# Patient Record
Sex: Male | Born: 1970 | Race: White | Hispanic: No | Marital: Married | Smoking: Former smoker
Health system: Southern US, Community
[De-identification: ages and names within clinical notes are randomized; demographics above are authoritative.]

## PROBLEM LIST (undated history)

## (undated) DIAGNOSIS — G459 Transient cerebral ischemic attack, unspecified: Secondary | ICD-10-CM

## (undated) DIAGNOSIS — Z8673 Personal history of transient ischemic attack (TIA), and cerebral infarction without residual deficits: Secondary | ICD-10-CM

## (undated) DIAGNOSIS — I639 Cerebral infarction, unspecified: Secondary | ICD-10-CM

## (undated) DIAGNOSIS — C3431 Malignant neoplasm of lower lobe, right bronchus or lung: Secondary | ICD-10-CM

## (undated) DIAGNOSIS — I10 Essential (primary) hypertension: Secondary | ICD-10-CM

## (undated) DIAGNOSIS — N529 Male erectile dysfunction, unspecified: Secondary | ICD-10-CM

## (undated) DIAGNOSIS — Z87898 Personal history of other specified conditions: Secondary | ICD-10-CM

## (undated) DIAGNOSIS — M76899 Other specified enthesopathies of unspecified lower limb, excluding foot: Secondary | ICD-10-CM

## (undated) DIAGNOSIS — G4733 Obstructive sleep apnea (adult) (pediatric): Secondary | ICD-10-CM

## (undated) DIAGNOSIS — Q211 Atrial septal defect: Secondary | ICD-10-CM

## (undated) DIAGNOSIS — R630 Anorexia: Secondary | ICD-10-CM

## (undated) DIAGNOSIS — J90 Pleural effusion, not elsewhere classified: Secondary | ICD-10-CM

## (undated) DIAGNOSIS — Z9989 Dependence on other enabling machines and devices: Secondary | ICD-10-CM

## (undated) DIAGNOSIS — I493 Ventricular premature depolarization: Secondary | ICD-10-CM

## (undated) HISTORY — DX: Cerebral infarction, unspecified: I63.9

## (undated) HISTORY — DX: Personal history of other specified conditions: Z87.898

## (undated) HISTORY — DX: Essential (primary) hypertension: I10

## (undated) HISTORY — DX: Personal history of transient ischemic attack (TIA), and cerebral infarction without residual deficits: Z86.73

## (undated) HISTORY — DX: Pleural effusion, not elsewhere classified: J90

## (undated) HISTORY — DX: Other specified enthesopathies of unspecified lower limb, excluding foot: M76.899

## (undated) HISTORY — DX: Transient cerebral ischemic attack, unspecified: G45.9

## (undated) HISTORY — PX: EYE SURGERY: SHX253

## (undated) HISTORY — DX: Male erectile dysfunction, unspecified: N52.9

## (undated) HISTORY — DX: Malignant neoplasm of lower lobe, right bronchus or lung: C34.31

## (undated) HISTORY — DX: Ventricular premature depolarization: I49.3

## (undated) HISTORY — DX: Anorexia: R63.0

---
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
## 2006-07-21 ENCOUNTER — Ambulatory Visit: Payer: Self-pay | Admitting: Ophthalmology

## 2011-09-27 HISTORY — PX: VITRECTOMY: SHX106

## 2013-04-16 ENCOUNTER — Ambulatory Visit: Payer: Self-pay | Admitting: Ophthalmology

## 2013-05-15 ENCOUNTER — Ambulatory Visit: Payer: Self-pay | Admitting: Ophthalmology

## 2015-01-16 NOTE — Op Note (Signed)
PATIENT NAMEMASAKI, Ruben Eaton MR#:  [id number] DATE OF BIRTH:  1970-10-06  DATE OF PROCEDURE:  05/15/2013  PROCEDURES PERFORMED: 1.  Pars plana vitrectomy of the right eye.  2.  Internal limiting membrane peel of the right eye.  3.  Phacoemulsification, intraocular lens insertion of the right eye.  4.  Endolaser of the right eye.   PREOPERATIVE DIAGNOSES: 1.  Visually-significant posterior sclerotic cataract, with vision of 20/50 or less.  2.  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   POSTOPERATIVE DIAGNOSIS:  1.  Visually-significant posterior sclerotic cataract, with vision of 20/50 or less.  2.  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   ESTIMATED BLOOD LOSS: Less than 1 mL.   PRIMARY SURGEON: Garlan Fair, M.D.   ANESTHESIA: Retrobulbar block of the right eye with monitored anesthesia care.   COMPLICATIONS: None.   INDICATIONS FOR PROCEDURE: The patient presented to my office with slowly decreasing vision in the right eye. The patient noted he is having difficulty reading and driving. The patient had a best-corrected visual acuity of 20/50-.   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 Risks, benefits, and alternatives of the above procedure were discussed, and the patient wished to proceed.   DETAILS: After informed consent was obtained, the patient was brought into the operative suite at [facility]. The patient was placed in supine position, given a small dose of Alfenta, and a retrobulbar block was performed on the right eye by the primary surgeon without any complications. The right eye was prepped and draped in sterile manner. After a lid speculum was inserted a side-port wound was created at approximately 10:30. DisCoVisc was injected into the anterior chamber  in order to maintain it. A main corneal wound was created at approximately 12 o'clock. A cystotome was introduced in the eye, and a continuous 360-degree anterior capsulorrhexis was created. The lens was hydrodissected using BSS. The lens was rotated 180 degrees. The phacoemulsification wand was introduced in the eye and the lens was broken into two halves and removed without complication.   Cortical and epinuclear material was removed using I and A without any complication. DisCoVisc was injected into the capsular bag in order to maintain it. A 12-diopter ZCB00 lens was injected into the capsular bag and rotated into position. A 10-0 nylon stitch was placed at the main corneal wound. I and A was used to remove the DisCoVisc. The 10-0 was tied and the wounds were hydrated to pressurize the eye and form the anterior chamber. The wounds were noted to be watertight, and attention was turned to the pars plana vitrectomy portion of the case.   A 25-gauge trocar was placed inferotemporally through displaced conjunctiva in an oblique fashion 4 mm beyond the limbus. A 25-gauge trocar was placed inferotemporally through displaced conjunctiva in an oblique fashion 3 mm beyond the limbus. The infusion cannula was turned on and inserted through the trocar and secured in position with Steri-Strips. Two more trocars were placed in a similar fashion superotemporally and superonasally. The vitreous cutter and light pipe were introduced in the eye and a core vitrectomy was performed. The vitreous base was confirmed as already elevated with visualization of the Weiss ring and with suction. Peripheral vitreous was trimmed for 360 degrees. Indocyanine green was injected onto the posterior pole and the epiretinal membrane counter-stained. The  indocyanine green was removed within 30 seconds, and both the epiretinal membrane and an internal limiting membrane peel was performed for 360 degrees around the fovea.   Once this was  completed, a scleral depressed exam was performed for 360 degrees. There were multiple areas of retinal tufts at the ora serrata. There did not appear to be any true openings of the retina, however, given the patient's significant myopia the surgeon elected to put 360 degrees of 3 rows of laser posterior to the ora serrata. This encompassed all the areas of retinal tufts. There were no signs of any breaks, tears, or retinal detachment.   Once this was completed, a partial air-fluid exchange was performed. The trocars were removed and the wounds were noted to be airtight; 5 mg of dexamethasone were given into the inferior fornix and pressure in the eye was confirmed to be approximately 15 mmHg. The lid speculum was removed and the eye was cleaned. TobraDex was placed in the eye and the patient was taken to postanesthesia care, with instructions to remain head-up.     ____________________________ Teresa Pelton. Starling Manns, MD mfa:[id number] D: 05/15/2013 08:37:03 ET T: 05/15/2013 09:08:41 ET JOB#: [id number]  cc: Teresa Pelton. Starling Manns, MD, <Dictator> Coralee Rud MD ELECTRONICALLY SIGNED 05/15/2013 10:27

## 2015-12-28 ENCOUNTER — Ambulatory Visit: Payer: Self-pay | Admitting: Physician Assistant

## 2015-12-28 ENCOUNTER — Encounter: Payer: Self-pay | Admitting: Physician Assistant

## 2015-12-28 VITALS — BP 130/90 | HR 60 | Temp 98.6°F

## 2015-12-28 DIAGNOSIS — M25561 Pain in right knee: Secondary | ICD-10-CM

## 2015-12-28 NOTE — Progress Notes (Signed)
S: c/o r knee pain for about a month, was in the gym doing squats and leg extensions, felt pain in leg but kept adding weight, sx were worse a month ago, did wear a knee brace for awhile, took nsaids, iced, still having pain at distal portion of quads and upper portion of patella, is worse in the mornings upon waking; seems to stretch out throughout the day; no clicking, popping, or numbness/tingling; states he runs on pavement, is still doing squats and extensions  O: Vitals wnl, nad, knee neg for bony tenderness, full rom, ligaments appear intact, area of supratella and distal quads is minimally tender, n/v intact  A: muscle strain of quads, patella tendonitis  P: wear knee brace during workouts, ice, nsaids, stop running on pavement, use bike, eliptical machine, arc trainer, or precor, if sx not improving in 1-2 weeks then f/u with sports med

## 2016-01-22 DIAGNOSIS — G4733 Obstructive sleep apnea (adult) (pediatric): Secondary | ICD-10-CM | POA: Diagnosis not present

## 2016-02-01 ENCOUNTER — Ambulatory Visit (INDEPENDENT_AMBULATORY_CARE_PROVIDER_SITE_OTHER): Payer: 59 | Admitting: Family Medicine

## 2016-02-01 ENCOUNTER — Encounter: Payer: Self-pay | Admitting: Family Medicine

## 2016-02-01 ENCOUNTER — Other Ambulatory Visit: Payer: Self-pay

## 2016-02-01 VITALS — BP 132/82 | HR 75 | Ht 66.0 in | Wt 170.0 lb

## 2016-02-01 DIAGNOSIS — M769 Unspecified enthesopathy, lower limb, excluding foot: Secondary | ICD-10-CM

## 2016-02-01 DIAGNOSIS — I10 Essential (primary) hypertension: Secondary | ICD-10-CM | POA: Insufficient documentation

## 2016-02-01 DIAGNOSIS — M25561 Pain in right knee: Secondary | ICD-10-CM | POA: Diagnosis not present

## 2016-02-01 DIAGNOSIS — M76899 Other specified enthesopathies of unspecified lower limb, excluding foot: Secondary | ICD-10-CM

## 2016-02-01 DIAGNOSIS — Z87898 Personal history of other specified conditions: Secondary | ICD-10-CM | POA: Insufficient documentation

## 2016-02-01 DIAGNOSIS — N529 Male erectile dysfunction, unspecified: Secondary | ICD-10-CM | POA: Insufficient documentation

## 2016-02-01 HISTORY — DX: Essential (primary) hypertension: I10

## 2016-02-01 HISTORY — DX: Other specified enthesopathies of unspecified lower limb, excluding foot: M76.899

## 2016-02-01 MED ORDER — VITAMIN D (ERGOCALCIFEROL) 1.25 MG (50000 UNIT) PO CAPS: 50000.0000 [IU] | ORAL_CAPSULE | ORAL | Status: DC

## 2016-02-01 MED ORDER — DICLOFENAC SODIUM 2 % TD SOLN: TRANSDERMAL | Status: DC

## 2016-02-01 NOTE — Assessment & Plan Note (Signed)
Patient does have a quadricep tendinitis as well as what appears to be a spur on the superior anterior aspect of the patella. I think that this is contribute in. We discussed a patella strap to help with the fulcrum and decreased the amount of irritation in the area. Topical anti-inflammatory's prescribed. Once weekly vitamin D given in case this is a nonhealing fracture. We discussed icing regimen. Patient and will follow-up with me again in 3-4 weeks. Unable to do the nitroglycerin patches secondary to other chronic comorbidities. Patient continues to have pain he would be more of a candidate for formal physical therapy and potential injections.

## 2016-02-01 NOTE — Patient Instructions (Signed)
Good to see you.  Ice 20 minutes 2 times daily. Usually after activity and before bed. Exercises 3 times a week.  pennsaid pinkie amount topically 2 times daily as needed.  Once weekly vitamin D Avoid jumping or deep squats for now and would cross train more with biking or swimming.  Patella strap on the bottom portion of your knee cap can help with working out as well See me again in 3-4 weeks.

## 2016-02-01 NOTE — Progress Notes (Signed)
Pre visit review using our clinic review tool, if applicable. No additional management support is needed unless otherwise documented below in the visit note. 

## 2016-02-01 NOTE — Progress Notes (Signed)
[name] [address] [place], [place] [address] Phone: [phone] Subjective:    I'm seeing this patient by the request  of:  Ashok Cordia  CC: Right Knee pain  [id number] Ruben Eaton is a 45 y.o. male coming in with complaint of knee pain. Patient states that he was in the gym doing squats and leg extensions and trying to work through the pain. Patient states over the course of time and seems be worsening. Has tried bracing, anti-inflammatories, icing. Pain seems to be on the anterior aspect of the knee.'s on provider. Concern for more of a patellar tendinitis or quadricep injury. Patient continues to work out but unfortunately cannot do anymore squatting. States that squatting seems to cause more pain. Things like running causes pain 2. All aching pain. Some mild weakness compared to the contralateral side is a severity of pain a 6 out of 10. Does not seem to be improving anymore. Patient like to start doing his other working out but is unable to do so. Denies any nighttime awakening from the pain.    No past medical history on file.  Patient Active Problem List   Diagnosis Date Noted  . 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 Essential (primary) hypertension 02/01/2016  . H/O disease 02/01/2016    No past surgical history on file. Social History   Social History  . Marital Status: Married    Spouse Name: N/A  . Number of Children: N/A  . Years of Education: N/A   Social History Main Topics  . Smoking status: Never Smoker   . Smokeless tobacco: None  . Alcohol Use: None  . Drug Use: None  . Sexual Activity: Not Asked   Other Topics Concern  . None   Social History Narrative   No Known Allergies No family history on file.  Past medical history, social, surgical and family history all reviewed in electronic medical record.  No pertanent information unless stated regarding to the chief complaint.   Review of  Systems: No headache, visual changes, nausea, vomiting, diarrhea, constipation, dizziness, abdominal pain, skin rash, fevers, chills, night sweats, weight loss, swollen lymph nodes, body aches, joint swelling, muscle aches, chest pain, shortness of breath, mood changes.   Objective Blood pressure 132/82, pulse 75, height '5\' 6"'$  (1.676 m), weight 170 lb (77.111 kg), SpO2 97 %.  General: No apparent distress alert and oriented x3 mood and affect normal, dressed appropriately.  HEENT: Pupils equal, extraocular movements intact  Respiratory: Patient's speak in full sentences and does not appear short of breath  Cardiovascular: No lower extremity edema, non tender, no erythema  Skin: Warm dry intact with no signs of infection or rash on extremities or on axial skeleton.  Abdomen: Soft nontender  Neuro: Cranial nerves II through XII are intact, neurovascularly intact in all extremities with 2+ DTRs and 2+ pulses.  Lymph: No lymphadenopathy of posterior or anterior cervical chain or axillae bilaterally.  Gait normal with good balance and coordination.  MSK:  Non tender with full range of motion and good stability and symmetric strength and tone of shoulders, elbows, wrist, hip, and ankles bilaterally.  Knee: Right Normal to inspection with no erythema or effusion or obvious bony abnormalities. Mild tenderness over the superior patella ROM full in flexion and extension and lower leg rotation. Ligaments with solid consistent endpoints including ACL, PCL, LCL, MCL. Negative Mcmurray's, Apley's, and Thessalonian tests. Non painful patellar compression. Patellar glide with mild  crepitus. Patellar and quadriceps tendons unremarkable. Hamstring and quadriceps strength is normal.  Contralateral knee unremarkable  MSK US performed of: Right knee This study was ordered, performed, and interpreted by Charlann Boxer D.O.  Knee: All structures visualized. Anteromedial, anterolateral, posteromedial, and  posterolateral menisci unremarkable without tearing, fraying, effusion, or displacement. Patellar Tendon unremarkable on long and transverse views without effusion. Quadricep tendon does have hypoechoic changes and some mild intersubstance tearing. Patient though does have what appears to be a patella spur superiorly verse possible nonhealing fracture. No abnormality of prepatellar bursa. LCL and MCL unremarkable on long and transverse views. No abnormality of origin of medial or lateral head of the gastrocnemius.  IMPRESSION:  Quadricep tendinitis with spurring of the superior patella reverse questionable nonhealing fracture  Procedure note 97026; 15 minutes spent for Therapeutic exercises as stated in above notes.  This included exercises focusing on stretching, strengthening, with significant focus on eccentric aspects.  Flexion and extension exercises working on eccentric aspect of the quadricep tendon as well as vastus medialis obliques strengthening. Proper technique shown and discussed handout in great detail with ATC.  All questions were discussed and answered.      Impression and Recommendations:     This case required medical decision making of moderate complexity.      Note: This dictation was prepared with Dragon dictation along with smaller phrase technology. Any transcriptional errors that result from this process are unintentional.

## 2016-02-04 DIAGNOSIS — I10 Essential (primary) hypertension: Secondary | ICD-10-CM | POA: Diagnosis not present

## 2016-02-04 DIAGNOSIS — R6882 Decreased libido: Secondary | ICD-10-CM | POA: Diagnosis not present

## 2016-02-12 DIAGNOSIS — Z87898 Personal history of other specified conditions: Secondary | ICD-10-CM | POA: Diagnosis not present

## 2016-02-12 DIAGNOSIS — I10 Essential (primary) hypertension: Secondary | ICD-10-CM | POA: Diagnosis not present

## 2016-02-12 DIAGNOSIS — N529 Male erectile dysfunction, unspecified: Secondary | ICD-10-CM | POA: Diagnosis not present

## 2016-02-29 ENCOUNTER — Ambulatory Visit (INDEPENDENT_AMBULATORY_CARE_PROVIDER_SITE_OTHER): Payer: 59 | Admitting: Family Medicine

## 2016-02-29 ENCOUNTER — Other Ambulatory Visit: Payer: Self-pay

## 2016-02-29 ENCOUNTER — Encounter: Payer: Self-pay | Admitting: Family Medicine

## 2016-02-29 VITALS — BP 130/82 | HR 68 | Ht 66.0 in | Wt 167.0 lb

## 2016-02-29 DIAGNOSIS — M769 Unspecified enthesopathy, lower limb, excluding foot: Secondary | ICD-10-CM | POA: Diagnosis not present

## 2016-02-29 DIAGNOSIS — M25561 Pain in right knee: Secondary | ICD-10-CM

## 2016-02-29 DIAGNOSIS — M76899 Other specified enthesopathies of unspecified lower limb, excluding foot: Secondary | ICD-10-CM

## 2016-02-29 NOTE — Progress Notes (Signed)
Ruben Eaton, [place] [address] Phone: [phone] Subjective:    I'm seeing this patient by the request  of:  Ruben Eaton  CC: Right Knee pain Follow-up  [id number] Ruben Eaton is a 45 y.o. male coming in with complaint of knee pain. Patient was found to have quadricep tendinitis and a bone spur. Patient has changed his activities, doing some bracing, avoiding heavy lifting, as well as icing. States that he is feeling approximately 40% better. No longer having pain with regular daily activities. Patient does try to work out though he does have some discomfort. Still severe discomfort when going up or downstairs. Only on the anterior aspect of the knee. Maybe less swelling.    No past medical history on file.  Patient Active Problem List   Diagnosis Date Noted  . 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 Essential (primary) hypertension 02/01/2016  . H/O disease 02/01/2016  . Quadriceps tendinitis 02/01/2016    No past surgical history on file. Social History   Social History  . Marital Status: Married    Spouse Name: N/A  . Number of Children: N/A  . Years of Education: N/A   Social History Main Topics  . Smoking status: Never Smoker   . Smokeless tobacco: None  . Alcohol Use: None  . Drug Use: None  . Sexual Activity: Not Asked   Other Topics Concern  . None   Social History Narrative   No Known Allergies No family history on file. No known family history of rheumatological diseases  Past medical history, social, surgical and family history all reviewed in electronic medical record.  No pertanent information unless stated regarding to the chief complaint.   Review of Systems: No headache, visual changes, nausea, vomiting, diarrhea, constipation, dizziness, abdominal pain, skin rash, fevers, chills, night sweats, weight loss, swollen lymph nodes, body aches, joint swelling, muscle aches,  chest pain, shortness of breath, mood changes.   Objective Blood pressure 130/82, pulse 68, height '5\' 6"'$  (1.676 m), weight 167 lb (75.751 kg), SpO2 97 %.  General: No apparent distress alert and oriented x3 mood and affect normal, dressed appropriately.  HEENT: Pupils equal, extraocular movements intact  Respiratory: Patient's speak in full sentences and does not appear short of breath  Cardiovascular: No lower extremity edema, non tender, no erythema  Skin: Warm dry intact with no signs of infection or rash on extremities or on axial skeleton.  Abdomen: Soft nontender  Neuro: Cranial nerves II through XII are intact, neurovascularly intact in all extremities with 2+ DTRs and 2+ pulses.  Lymph: No lymphadenopathy of posterior or anterior cervical chain or axillae bilaterally.  Gait normal with good balance and coordination.  MSK:  Non tender with full range of motion and good stability and symmetric strength and tone of shoulders, elbows, wrist, hip, and ankles bilaterally.  Knee: Right Normal to inspection with no erythema or effusion or obvious bony abnormalities. Continued mild tenderness over the superior patella ROM full in flexion and extension and lower leg rotation. Ligaments with solid consistent endpoints including ACL, PCL, LCL, MCL. Negative Mcmurray's, Apley's, and Thessalonian tests. Non painful patellar compression. Patellar glide with mild crepitus. Patellar and quadriceps tendons unremarkable. Hamstring and quadriceps strength is normal.  Contralateral knee unremarkable  MSK US performed of: Right knee This study was ordered, performed, and interpreted by Ruben Eaton D.O.  Knee: All structures visualized. Anteromedial, anterolateral, posteromedial, and posterolateral menisci unremarkable without  tearing, fraying, effusion, or displacement. Patellar Tendon unremarkable on long and transverse views without effusion. Quadricep tendon d this had significant decrease in  hypoechoic changes. Still increasing Doppler flow noted. Patient does have a large patellar spur measuring 0.73 cm in length. This does cause contact with the quadricep tendon. No intersubstance tearing of the quadricep tendon. No abnormality of prepatellar bursa. LCL and MCL unremarkable on long and transverse views. No abnormality of origin of medial or lateral head of the gastrocnemius.  IMPRESSION:  Continued patellar spur causing quadricep tendinitis      Impression and Recommendations:     This case required medical decision making of moderate complexity.      Note: This dictation was prepared with Dragon dictation along with smaller phrase technology. Any transcriptional errors that result from this process are unintentional.

## 2016-02-29 NOTE — Progress Notes (Signed)
.  bpcmh

## 2016-02-29 NOTE — Assessment & Plan Note (Signed)
Patient is making some improvement but would not be able to start increasing his activity secondary to the size of the spur. We discussed different treatment options. Patient's will continue with the home exercises, we will have patient continue with the vitamin D supplementation. Given new prescription for nitroglycerin and warned of potential side effects. Patient is no longer taking the erectile dysfunction medication. 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 We discussed icing regimen still and bracing. Patient will follow-up again in 4 weeks. At that time we'll likely ultrasound to make sure that this is improving. Patient would also be a candidate for PRP injection to try to decrease the size of the spur itself.

## 2016-02-29 NOTE — Patient Instructions (Signed)
Good to see you  Ice is your friend still  I would continue the strap and vitamin D Nitroglycerin Protocol   Apply 1/4 nitroglycerin patch to affected area daily.  Change position of patch within the affected area every 24 hours.  You may experience a headache during the first 1-2 weeks of using the patch, these should subside.  If you experience headaches after beginning nitroglycerin patch treatment, you may take your preferred over the counter pain reliever.  Another side effect of the nitroglycerin patch is skin irritation or rash related to patch adhesive.  Please notify our office if you develop more severe headaches or rash, and stop the patch.  Tendon healing with nitroglycerin patch may require 12 to 24 weeks depending on the extent of injury.  Men should not use if taking Viagra, Cialis, or Levitra.   Do not use if you have migraines or rosacea.   Keep doing the exercises  See me again in 4 weeks we will rescan and make sure the spur is getting smaller.

## 2016-03-01 ENCOUNTER — Other Ambulatory Visit: Payer: Self-pay | Admitting: *Deleted

## 2016-03-01 MED ORDER — NITROGLYCERIN 0.2 MG/HR TD PT24: MEDICATED_PATCH | TRANSDERMAL | Status: DC

## 2016-03-04 DIAGNOSIS — H35371 Puckering of macula, right eye: Secondary | ICD-10-CM | POA: Diagnosis not present

## 2016-03-30 ENCOUNTER — Other Ambulatory Visit: Payer: Self-pay

## 2016-03-30 ENCOUNTER — Encounter: Payer: Self-pay | Admitting: Family Medicine

## 2016-03-30 ENCOUNTER — Ambulatory Visit (INDEPENDENT_AMBULATORY_CARE_PROVIDER_SITE_OTHER): Payer: 59 | Admitting: Family Medicine

## 2016-03-30 VITALS — BP 126/76 | HR 66 | Wt 171.0 lb

## 2016-03-30 DIAGNOSIS — M769 Unspecified enthesopathy, lower limb, excluding foot: Secondary | ICD-10-CM | POA: Diagnosis not present

## 2016-03-30 DIAGNOSIS — M76899 Other specified enthesopathies of unspecified lower limb, excluding foot: Secondary | ICD-10-CM

## 2016-03-30 NOTE — Patient Instructions (Signed)
Great to see you  You are doing great  Start at 50% of what you were doing.  Increase 10% a week.  If pain, take 1 step back Continue the nitro for another month. Then go to 3 times a week for a month See me again in 2 months.

## 2016-03-30 NOTE — Progress Notes (Signed)
[name] [address] [place], [place] [address] Phone: [phone] Subjective:    CC: Right Knee pain Follow-up  [id number] Ruben Eaton is a 45 y.o. male coming in with complaint of knee pain. Patient was found to have quadricep tendinitis and a bone spur. Patient has changed his activities, doing some bracing, avoiding heavy lifting, as well as icing. States that he is feeling approximately 85% better. Patient was also started on nitroglycerin and has had no side effects. Feels that he is able to do more activities. States that he really has no pain overall. Would put the severity of pain is 1 out of 10. Has not been lifting is looking forward to starting.    No past medical history on file.  Patient Active Problem List   Diagnosis Date Noted  . 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 Essential (primary) hypertension 02/01/2016  . H/O disease 02/01/2016  . Quadriceps tendinitis 02/01/2016    No past surgical history on file. Social History   Social History  . Marital Status: Married    Spouse Name: N/A  . Number of Children: N/A  . Years of Education: N/A   Social History Main Topics  . Smoking status: Never Smoker   . Smokeless tobacco: None  . Alcohol Use: None  . Drug Use: None  . Sexual Activity: Not Asked   Other Topics Concern  . None   Social History Narrative   No Known Allergies No family history on file. No known family history of rheumatological diseases  Past medical history, social, surgical and family history all reviewed in electronic medical record.  No pertanent information unless stated regarding to the chief complaint.   Review of Systems: No headache, visual changes, nausea, vomiting, diarrhea, constipation, dizziness, abdominal pain, skin rash, fevers, chills, night sweats, weight loss, swollen lymph nodes, body aches, joint swelling, muscle aches, chest pain, shortness of breath, mood  changes.   Objective Blood pressure 126/76, pulse 66, weight 171 lb (77.565 kg).  General: No apparent distress alert and oriented x3 mood and affect normal, dressed appropriately.  HEENT: Pupils equal, extraocular movements intact  Respiratory: Patient's speak in full sentences and does not appear short of breath  Cardiovascular: No lower extremity edema, non tender, no erythema  Skin: Warm dry intact with no signs of infection or rash on extremities or on axial skeleton.  Abdomen: Soft nontender  Neuro: Cranial nerves II through XII are intact, neurovascularly intact in all extremities with 2+ DTRs and 2+ pulses.  Lymph: No lymphadenopathy of posterior or anterior cervical chain or axillae bilaterally.  Gait normal with good balance and coordination.  MSK:  Non tender with full range of motion and good stability and symmetric strength and tone of shoulders, elbows, wrist, hip, and ankles bilaterally.  Knee: Right Normal to inspection with no erythema or effusion or obvious bony abnormalities. Very minimal tenderness over the superior patellar area ROM full in flexion and extension and lower leg rotation. Ligaments with solid consistent endpoints including ACL, PCL, LCL, MCL. Negative Mcmurray's, Apley's, and Thessalonian tests. Non painful patellar compression. Patellar glide with nocrepitus. Patellar and quadriceps tendons unremarkable. Hamstring and quadriceps strength is normal.  Contralateral knee unremarkable  MSK US performed of: Right knee This study was ordered, performed, and interpreted by Charlann Boxer D.O.  Knee: All structures visualized. Anteromedial, anterolateral, posteromedial, and posterolateral menisci unremarkable without tearing, fraying, effusion, or displacement. Patellar Tendon unremarkable on long and  transverse views without effusion. Quadricep tendon unremarkable  Patient does have a patellar spur measuring 0.35 cm down from0.73 cm in length. No abnormality  of prepatellar bursa. LCL and MCL unremarkable on long and transverse views. No abnormality of origin of medial or lateral head of the gastrocnemius.  IMPRESSION:  Significant decrease in size of patellar spur      Impression and Recommendations:     This case required medical decision making of moderate complexity.      Note: This dictation was prepared with Dragon dictation along with smaller phrase technology. Any transcriptional errors that result from this process are unintentional.

## 2016-03-30 NOTE — Assessment & Plan Note (Signed)
Patient has been progression. Patient given an exercise protocol to start increasing his activity slowly. Continue on the nitroglycerin at this time. Patient knows not taking other medications a could cross-react.patient knows not to take this is Ruben Eaton. We discussed with patient about possible titrating down over the course the next 30 days. We discussed forcing symptoms to take a step back. Patient will follow-up again in 6-8 weeks. If worsening symptoms we may want to consider injection or PRP injections.  Spent  25 minutes with patient face-to-face and had greater than 50% of counseling including as described above in assessment and plan.

## 2016-04-26 DIAGNOSIS — G4733 Obstructive sleep apnea (adult) (pediatric): Secondary | ICD-10-CM | POA: Diagnosis not present

## 2016-05-05 ENCOUNTER — Other Ambulatory Visit: Payer: Self-pay | Admitting: Family Medicine

## 2016-05-05 MED ORDER — VITAMIN D (ERGOCALCIFEROL) 1.25 MG (50000 UNIT) PO CAPS: 50000.0000 [IU] | ORAL_CAPSULE | ORAL | 0 refills | Status: AC

## 2016-06-05 NOTE — Progress Notes (Signed)
[facility] [address] [place], [place] [address] Phone: [phone] Subjective:    CC: Right Knee pain Follow-up  JSE:[id number]  Ruben Eaton is a 45 y.o. male coming in with complaint of knee pain. Patient was found to have quadricep tendinitis and a bone spur. Patient was last seen greater than 2 months ago. Was doing significantly better with home exercises, bracing, medications, topical. Patient is also doing the meticulous patch. Patient states He is 85-90% better. Still some mild discomfort but has been able to increase activity as tolerated. Patient is doing most activities that he would like to be doing at this time. Denies any new symptoms. Fairly happy with the results.    No past medical history on file.  Patient Active Problem List   Diagnosis Date Noted  . 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 Essential (primary) hypertension 02/01/2016  . H/O disease 02/01/2016  . Quadriceps tendinitis 02/01/2016    No past surgical history on file. Social History   Social History  . Marital status: Married    Spouse name: N/A  . Number of children: N/A  . Years of education: N/A   Social History Main Topics  . Smoking status: Never Smoker  . Smokeless tobacco: None  . Alcohol use None  . Drug use: Unknown  . Sexual activity: Not Asked   Other Topics Concern  . None   Social History Narrative  . None   No Known Allergies No family history on file. No known family history of rheumatological diseases  Past medical history, social, surgical and family history all reviewed in electronic medical record.  No pertanent information unless stated regarding to the chief complaint.   Review of Systems: No headache, visual changes, nausea, vomiting, diarrhea, constipation, dizziness, abdominal pain, skin rash, fevers, chills, night sweats, weight loss, swollen lymph nodes, body aches, joint swelling, muscle aches, chest pain,  shortness of breath, mood changes.   Objective  Blood pressure 118/80, pulse 62, weight 168 lb (76.2 kg), SpO2 97 %.  General: No apparent distress alert and oriented x3 mood and affect normal, dressed appropriately.  HEENT: Pupils equal, extraocular movements intact  Respiratory: Patient's speak in full sentences and does not appear short of breath  Cardiovascular: No lower extremity edema, non tender, no erythema  Skin: Warm dry intact with no signs of infection or rash on extremities or on axial skeleton.  Abdomen: Soft nontender  Neuro: Cranial nerves II through XII are intact, neurovascularly intact in all extremities with 2+ DTRs and 2+ pulses.  Lymph: No lymphadenopathy of posterior or anterior cervical chain or axillae bilaterally.  Gait normal with good balance and coordination.  MSK:  Non tender with full range of motion and good stability and symmetric strength and tone of shoulders, elbows, wrist, hip, and ankles bilaterally.  Knee: Right Normal to inspection with no erythema or effusion or obvious bony abnormalities. Nontender today ROM full in flexion and extension and lower leg rotation. Ligaments with solid consistent endpoints including ACL, PCL, LCL, MCL. Negative Mcmurray's, Apley's, and Thessalonian tests. Non painful patellar compression. Patellar glide with no crepitus. Patellar and quadriceps tendons unremarkable. Hamstring and quadriceps strength is normal.  Contralateral knee unremarkable      Impression and Recommendations:     This case required medical decision making of moderate complexity.      Note: This dictation was prepared with Dragon dictation along with smaller phrase technology. Any transcriptional errors that  result from this process are unintentional.

## 2016-06-06 ENCOUNTER — Encounter: Payer: Self-pay | Admitting: Family Medicine

## 2016-06-06 ENCOUNTER — Ambulatory Visit (INDEPENDENT_AMBULATORY_CARE_PROVIDER_SITE_OTHER): Payer: 59 | Admitting: Family Medicine

## 2016-06-06 DIAGNOSIS — M769 Unspecified enthesopathy, lower limb, excluding foot: Secondary | ICD-10-CM

## 2016-06-06 DIAGNOSIS — M76899 Other specified enthesopathies of unspecified lower limb, excluding foot: Secondary | ICD-10-CM

## 2016-06-06 NOTE — Assessment & Plan Note (Signed)
Patient is been doing very well. No severe pain change in management. Patient follow-up as needed. Worsening symptoms patient knows that we will need to consider injections, formal physical therapy or go back on the nitroglycerin.

## 2016-08-08 DIAGNOSIS — I10 Essential (primary) hypertension: Secondary | ICD-10-CM | POA: Diagnosis not present

## 2016-08-08 DIAGNOSIS — Z0000 Encounter for general adult medical examination without abnormal findings: Secondary | ICD-10-CM | POA: Diagnosis not present

## 2016-08-08 DIAGNOSIS — E559 Vitamin D deficiency, unspecified: Secondary | ICD-10-CM | POA: Diagnosis not present

## 2016-08-17 DIAGNOSIS — G4733 Obstructive sleep apnea (adult) (pediatric): Secondary | ICD-10-CM | POA: Diagnosis not present

## 2016-08-23 DIAGNOSIS — R899 Unspecified abnormal finding in specimens from other organs, systems and tissues: Secondary | ICD-10-CM | POA: Diagnosis not present

## 2016-08-23 DIAGNOSIS — Z0001 Encounter for general adult medical examination with abnormal findings: Secondary | ICD-10-CM | POA: Diagnosis not present

## 2016-08-23 DIAGNOSIS — I10 Essential (primary) hypertension: Secondary | ICD-10-CM | POA: Diagnosis not present

## 2016-08-26 DIAGNOSIS — I639 Cerebral infarction, unspecified: Secondary | ICD-10-CM

## 2016-08-26 HISTORY — DX: Cerebral infarction, unspecified: I63.9

## 2016-09-07 ENCOUNTER — Observation Stay: Payer: 59

## 2016-09-07 ENCOUNTER — Inpatient Hospital Stay
Admission: EM | Admit: 2016-09-07 | Discharge: 2016-09-08 | DRG: 065 | Disposition: A | Discharge status: Home / Self Care | Payer: 59 | Attending: Internal Medicine | Admitting: Internal Medicine

## 2016-09-07 ENCOUNTER — Encounter: Payer: Self-pay | Admitting: Emergency Medicine

## 2016-09-07 ENCOUNTER — Observation Stay (HOSPITAL_COMMUNITY)
Admit: 2016-09-07 | Discharge: 2016-09-07 | Disposition: A | Discharge status: Home / Self Care | Payer: 59 | Attending: Internal Medicine | Admitting: Internal Medicine

## 2016-09-07 ENCOUNTER — Emergency Department: Payer: 59

## 2016-09-07 DIAGNOSIS — Z833 Family history of diabetes mellitus: Secondary | ICD-10-CM

## 2016-09-07 DIAGNOSIS — Z823 Family history of stroke: Secondary | ICD-10-CM

## 2016-09-07 DIAGNOSIS — E785 Hyperlipidemia, unspecified: Secondary | ICD-10-CM | POA: Diagnosis not present

## 2016-09-07 DIAGNOSIS — M79604 Pain in right leg: Secondary | ICD-10-CM | POA: Diagnosis not present

## 2016-09-07 DIAGNOSIS — R29818 Other symptoms and signs involving the nervous system: Secondary | ICD-10-CM | POA: Diagnosis not present

## 2016-09-07 DIAGNOSIS — R29810 Facial weakness: Secondary | ICD-10-CM | POA: Diagnosis not present

## 2016-09-07 DIAGNOSIS — R52 Pain, unspecified: Secondary | ICD-10-CM

## 2016-09-07 DIAGNOSIS — E871 Hypo-osmolality and hyponatremia: Secondary | ICD-10-CM | POA: Diagnosis present

## 2016-09-07 DIAGNOSIS — I69392 Facial weakness following cerebral infarction: Secondary | ICD-10-CM

## 2016-09-07 DIAGNOSIS — I639 Cerebral infarction, unspecified: Secondary | ICD-10-CM | POA: Diagnosis not present

## 2016-09-07 DIAGNOSIS — I10 Essential (primary) hypertension: Secondary | ICD-10-CM | POA: Diagnosis not present

## 2016-09-07 DIAGNOSIS — R29700 NIHSS score 0: Secondary | ICD-10-CM | POA: Diagnosis present

## 2016-09-07 DIAGNOSIS — Q211 Atrial septal defect: Secondary | ICD-10-CM | POA: Diagnosis not present

## 2016-09-07 DIAGNOSIS — G459 Transient cerebral ischemic attack, unspecified: Secondary | ICD-10-CM | POA: Diagnosis present

## 2016-09-07 DIAGNOSIS — I6350 Cerebral infarction due to unspecified occlusion or stenosis of unspecified cerebral artery: Secondary | ICD-10-CM

## 2016-09-07 DIAGNOSIS — M79605 Pain in left leg: Secondary | ICD-10-CM | POA: Diagnosis not present

## 2016-09-07 DIAGNOSIS — Z7982 Long term (current) use of aspirin: Secondary | ICD-10-CM

## 2016-09-07 DIAGNOSIS — I6522 Occlusion and stenosis of left carotid artery: Secondary | ICD-10-CM | POA: Diagnosis not present

## 2016-09-07 DIAGNOSIS — I119 Hypertensive heart disease without heart failure: Secondary | ICD-10-CM | POA: Diagnosis not present

## 2016-09-07 HISTORY — DX: Transient cerebral ischemic attack, unspecified: G45.9

## 2016-09-07 HISTORY — DX: Essential (primary) hypertension: I10

## 2016-09-07 HISTORY — DX: Cerebral infarction, unspecified: I63.9

## 2016-09-07 LAB — CBC
HEMATOCRIT: 42.5 % (ref 40.0–52.0)
Hemoglobin: 15.1 g/dL (ref 13.0–18.0)
MCH: 32.9 pg (ref 26.0–34.0)
MCHC: 35.6 g/dL (ref 32.0–36.0)
MCV: 92.4 fL (ref 80.0–100.0)
Platelets: 270 10*3/uL (ref 150–440)
RBC: 4.6 MIL/uL (ref 4.40–5.90)
RDW: 13.5 % (ref 11.5–14.5)
WBC: 8.8 10*3/uL (ref 3.8–10.6)

## 2016-09-07 LAB — BASIC METABOLIC PANEL
ANION GAP: 8 (ref 5–15)
BUN: 15 mg/dL (ref 6–20)
CO2: 26 mmol/L (ref 22–32)
Calcium: 9.5 mg/dL (ref 8.9–10.3)
Chloride: 99 mmol/L — ABNORMAL LOW (ref 101–111)
Creatinine, Ser: 1.03 mg/dL (ref 0.61–1.24)
GFR calc Af Amer: >60 mL/min (ref >60)
GFR calc non Af Amer: >60 mL/min (ref >60)
GLUCOSE: 143 mg/dL — AB (ref 65–99)
POTASSIUM: 4.1 mmol/L (ref 3.5–5.1)
Sodium: 133 mmol/L — ABNORMAL LOW (ref 135–145)

## 2016-09-07 LAB — PROTIME-INR
INR: 0.97
Prothrombin Time: 12.9 seconds (ref 11.4–15.2)

## 2016-09-07 LAB — LIPID PANEL
CHOL/HDL RATIO: 2.1 ratio
Cholesterol: 228 mg/dL — ABNORMAL HIGH (ref 0–200)
HDL: 108 mg/dL (ref >40)
LDL CALC: 111 mg/dL — AB (ref 0–99)
Triglycerides: 44 mg/dL (ref <150)
VLDL: 9 mg/dL (ref 0–40)

## 2016-09-07 LAB — SEDIMENTATION RATE: SED RATE: 6 mm/h (ref 0–15)

## 2016-09-07 LAB — C-REACTIVE PROTEIN: CRP: <0.8 mg/dL (ref <1.0)

## 2016-09-07 LAB — ANTITHROMBIN III: AntiThromb III Func: 92 % (ref 75–120)

## 2016-09-07 LAB — TROPONIN I: Troponin I: <0.03 ng/mL (ref <0.03)

## 2016-09-07 LAB — APTT: aPTT: 26 seconds (ref 24–36)

## 2016-09-07 MED ORDER — ASPIRIN 300 MG RE SUPP: 300.0000 mg | Freq: Every day | RECTAL | Status: DC

## 2016-09-07 MED ORDER — CLOPIDOGREL BISULFATE 75 MG PO TABS
75.0000 mg | ORAL_TABLET | Freq: Every day | ORAL | Status: DC
  Administered 2016-09-07: 75 mg via ORAL
  Filled 2016-09-07: qty 1

## 2016-09-07 MED ORDER — ASPIRIN 325 MG PO TABS
325.0000 mg | ORAL_TABLET | Freq: Every day | ORAL | Status: DC
  Administered 2016-09-07: 325 mg via ORAL
  Filled 2016-09-07: qty 1

## 2016-09-07 MED ORDER — ACETAMINOPHEN 160 MG/5ML PO SOLN: 650.0000 mg | ORAL | Status: DC | PRN

## 2016-09-07 MED ORDER — ENOXAPARIN SODIUM 40 MG/0.4ML ~~LOC~~ SOLN
40.0000 mg | SUBCUTANEOUS | Status: DC
  Administered 2016-09-07: 19:00:00 40 mg via SUBCUTANEOUS
  Filled 2016-09-07: qty 0.4

## 2016-09-07 MED ORDER — B COMPLEX VITAMINS PO CAPS
1.0000 | ORAL_CAPSULE | Freq: Every day | ORAL | Status: DC
  Administered 2016-09-07: 15:00:00 1 via ORAL
  Filled 2016-09-07 (×3): qty 1

## 2016-09-07 MED ORDER — LORAZEPAM 1 MG PO TABS
1.0000 mg | ORAL_TABLET | Freq: Once | ORAL | Status: AC
  Administered 2016-09-07: 1 mg via ORAL
  Filled 2016-09-07: qty 1

## 2016-09-07 MED ORDER — ACETAMINOPHEN 325 MG PO TABS: 650.0000 mg | ORAL_TABLET | ORAL | Status: DC | PRN

## 2016-09-07 MED ORDER — SENNOSIDES-DOCUSATE SODIUM 8.6-50 MG PO TABS: 1.0000 | ORAL_TABLET | Freq: Every evening | ORAL | Status: DC | PRN

## 2016-09-07 MED ORDER — ASPIRIN EC 81 MG PO TBEC
81.0000 mg | DELAYED_RELEASE_TABLET | Freq: Every day | ORAL | Status: DC
Start: 2016-09-08 — End: 2016-09-08

## 2016-09-07 MED ORDER — ZOLPIDEM TARTRATE 5 MG PO TABS: 10.0000 mg | ORAL_TABLET | Freq: Every evening | ORAL | Status: DC | PRN

## 2016-09-07 MED ORDER — STROKE: EARLY STAGES OF RECOVERY BOOK
Freq: Once | Status: AC
  Administered 2016-09-07: 07:00:00

## 2016-09-07 MED ORDER — VITAMIN D (ERGOCALCIFEROL) 1.25 MG (50000 UNIT) PO CAPS: 50000.0000 [IU] | ORAL_CAPSULE | ORAL | Status: DC

## 2016-09-07 MED ORDER — ATORVASTATIN CALCIUM 20 MG PO TABS
40.0000 mg | ORAL_TABLET | Freq: Every day | ORAL | Status: DC
  Administered 2016-09-07: 40 mg via ORAL
  Filled 2016-09-07: qty 2

## 2016-09-07 MED ORDER — ACETAMINOPHEN 650 MG RE SUPP: 650.0000 mg | RECTAL | Status: DC | PRN

## 2016-09-07 NOTE — Progress Notes (Signed)
OT Cancellation Note  Patient Details Name: Ruben Eaton MRN: [id number] DOB: [birth year]   Cancelled Treatment:    Reason Eval/Treat Not Completed: OT screened, no needs identified, will sign off. Order received and chart reviewed.  Met with patient and his wife and daughter.  Pt is at baseline for ADLs and no needs identified. No complaints of blurred or double vision but patient has decreased eye tracking and convergence.  Will discuss with NSG to ensure this is not a new finding.  Signing off for OT.  Thank you for the referral.  Chrys Racer, OTR/L ascom [phone] 09/07/16, 3:19 PM

## 2016-09-07 NOTE — Progress Notes (Signed)
    [facility] has been requested to perform a transesophageal echocardiogram on Ruben Eaton for evaluation of CVA/TIA in the setting of positive bubble study on TTE. Review of telemetry showed NSR with frequent PACs/PVCs. LE ultrasound has been recommend as well. After careful review of history and examination, the risks and benefits of transesophageal echocardiogram have been explained including risks of esophageal damage, perforation (1:10,000 risk), bleeding, pharyngeal hematoma as well as other potential complications associated with conscious sedation including aspiration, arrhythmia, respiratory failure and death. Alternatives to treatment were discussed, questions were answered. Patient is willing to proceed.   Christell Faith, PA-C 09/07/2016 4:36 PM

## 2016-09-07 NOTE — Consult Note (Addendum)
Referring Physician: Leslye Peer    Chief Complaint: Left facial droop  HPI: Ruben Eaton is an 45 y.o. male with a history of HTN.  Reports that he woke up to go to third shift and did not feel well.  He did not speak or see anyone prior to going to work.  Once at work his coworkers noted that his face was drooped and he noted that he was drooling from the left side of his mouth.  Patient presented to the ED at that time.  Initial NIHSS of 0.    Patient takes ASA at home about 4-5 days a week.    Date last known well: Date: 09/05/2016 Time last known well: Time: 13:00 tPA Given: No: Outside treatment window, improvement in symptoms  Past Medical History:  Diagnosis Date  . Hypertension     History reviewed. No pertinent surgical history.  Family history: Mother with DM.  Father with dementia.  Maternal grandmother deceased from a stroke.    Social History:  reports that he has never smoked. He has never used smokeless tobacco. He reports that he does not drink alcohol. His drug history is not on file.  Allergies: No Known Allergies  Medications:  I have reviewed the patient's current medications. Prior to Admission:  Prescriptions Prior to Admission  Medication Sig Dispense Refill Last Dose  . b complex vitamins capsule Take 1 capsule by mouth daily.    09/06/2016 at Unknown time  . Diclofenac Sodium 2 % SOLN Apply 1 pump twice daily as needed. 112 g 3 prn  . ibuprofen (ADVIL,MOTRIN) 200 MG tablet Take 200 mg by mouth every 6 (six) hours as needed.    09/06/2016 at Unknown time  . lisinopril-hydrochlorothiazide (PRINZIDE,ZESTORETIC) 20-12.5 MG tablet Take 0.5 tablets by mouth daily.    09/06/2016 at Unknown time  . loratadine (CLARITIN) 10 MG tablet Take 10 mg by mouth daily.    09/06/2016 at Unknown time  . tadalafil (CIALIS) 5 MG tablet Take 5 mg by mouth daily as needed.    prn  . Vitamin D, Ergocalciferol, (DRISDOL) 50000 units CAPS capsule Take 1 capsule (50,000 Units total)  by mouth every 7 (seven) days. 12 capsule 0 Past Week at Unknown time  . zolpidem (AMBIEN) 10 MG tablet 10 mg at bedtime.    09/06/2016 at Unknown time  . nitroGLYCERIN (NITRO-DUR) 0.2 mg/hr patch Apply 1/4 patch onto the skin daily. (Patient not taking: Reported on 09/07/2016) 30 patch 0 Not Taking at Unknown time   Scheduled: . aspirin  300 mg Rectal Daily   Or  . aspirin  325 mg Oral Daily  . b complex vitamins  1 capsule Oral Daily  . enoxaparin (LOVENOX) injection  40 mg Subcutaneous Q24H  . [START ON 09/12/2016] Vitamin D (Ergocalciferol)  50,000 Units Oral Q7 days    ROS: History obtained from the patient  General ROS: negative for - chills, fatigue, fever, night sweats, weight gain or weight loss Psychological ROS: negative for - behavioral disorder, hallucinations, memory difficulties, mood swings or suicidal ideation Ophthalmic ROS: right stigmatism ENT ROS: negative for - epistaxis, nasal discharge, oral lesions, sore throat, tinnitus or vertigo Allergy and Immunology ROS: negative for - hives or itchy/watery eyes Hematological and Lymphatic ROS: negative for - bleeding problems, bruising or swollen lymph nodes Endocrine ROS: negative for - galactorrhea, hair pattern changes, polydipsia/polyuria or temperature intolerance Respiratory ROS: negative for - cough, hemoptysis, shortness of breath or wheezing Cardiovascular ROS: negative for - chest  pain, dyspnea on exertion, edema or irregular heartbeat Gastrointestinal ROS: negative for - abdominal pain, diarrhea, hematemesis, nausea/vomiting or stool incontinence Genito-Urinary ROS: negative for - dysuria, hematuria, incontinence or urinary frequency/urgency Musculoskeletal ROS: negative for - joint swelling or muscular weakness Neurological ROS: as noted in HPI Dermatological ROS: negative for rash and skin lesion changes  Physical Examination: Blood pressure 128/76, pulse 69, temperature 98 F (36.7 C), temperature source  Oral, resp. rate 18, height 5' 6" (1.676 m), weight 76.5 kg (168 lb 11.2 oz), SpO2 97 %.  HEENT-  Normocephalic, no lesions, without obvious abnormality.  Normal external eye and conjunctiva.  Normal TM's bilaterally.  Normal auditory canals and external ears. Normal external nose, mucus membranes and septum.  Normal pharynx. Cardiovascular- S1, S2 normal, pulses palpable throughout   Lungs- chest clear, no wheezing, rales, normal symmetric air entry Abdomen- soft, non-tender; bowel sounds normal; no masses,  no organomegaly Extremities- no edema Lymph-no adenopathy palpable Musculoskeletal-no joint tenderness, deformity or swelling Skin-warm and dry, no hyperpigmentation, vitiligo, or suspicious lesions  Neurological Examination Mental Status: Alert, oriented, thought content appropriate.  Speech fluent without evidence of aphasia.  Able to follow 3 step commands without difficulty. Cranial Nerves: II: Discs flat bilaterally; Visual fields grossly normal, pupils equal, round, reactive to light and accommodation III,IV, VI: ptosis not present, extra-ocular motions intact bilaterally, right esotropia V,VII: decrease in right NLF, facial light touch sensation normal bilaterally VIII: hearing normal bilaterally IX,X: gag reflex present XI: bilateral shoulder shrug XII: midline tongue extension Motor: Right : Upper extremity   5/5    Left:     Upper extremity   5-/5 with mild pronator drift  Lower extremity   5/5     Lower extremity   5/5 Tone and bulk:normal tone throughout; no atrophy noted Sensory: Pinprick and light touch intact throughout, bilaterally Deep Tendon Reflexes: 2+ and symmetric throughout Plantars: Right: downgoing   Left: downgoing Cerebellar: Normal finger-to-nose and normal heel-to-shin testing bilaterally Gait: normal gait and station    Laboratory Studies:  Basic Metabolic Panel:  Recent Labs Lab 09/07/16 0307  NA 133*  K 4.1  CL 99*  CO2 26  GLUCOSE  143*  BUN 15  CREATININE 1.03  CALCIUM 9.5    Liver Function Tests: No results for input(s): AST, ALT, ALKPHOS, BILITOT, PROT, ALBUMIN in the last 168 hours. No results for input(s): LIPASE, AMYLASE in the last 168 hours. No results for input(s): AMMONIA in the last 168 hours.  CBC:  Recent Labs Lab 09/07/16 0219  WBC 8.8  HGB 15.1  HCT 42.5  MCV 92.4  PLT 270    Cardiac Enzymes:  Recent Labs Lab 09/07/16 0307  TROPONINI <0.03    BNP: Invalid input(s): POCBNP  CBG: No results for input(s): GLUCAP in the last 168 hours.  Microbiology: No results found for this or any previous visit.  Coagulation Studies: No results for input(s): LABPROT, INR in the last 72 hours.  Urinalysis: No results for input(s): COLORURINE, LABSPEC, PHURINE, GLUCOSEU, HGBUR, BILIRUBINUR, KETONESUR, PROTEINUR, UROBILINOGEN, NITRITE, LEUKOCYTESUR in the last 168 hours.  Invalid input(s): APPERANCEUR  Lipid Panel:    Component Value Date/Time   CHOL 228 (H) 09/07/2016 0307   TRIG 44 09/07/2016 0307   HDL 108 09/07/2016 0307   CHOLHDL 2.1 09/07/2016 0307   VLDL 9 09/07/2016 0307   LDLCALC 111 (H) 09/07/2016 0307    HgbA1C: No results found for: HGBA1C  Urine Drug Screen:  No results found for: LABOPIA, COCAINSCRNUR,  LABBENZ, AMPHETMU, THCU, LABBARB  Alcohol Level: No results for input(s): ETH in the last 168 hours.  Other results: EKG: sinus rhythm at 92 bpm.  Imaging: Ct Head Wo Contrast  Result Date: 09/07/2016 CLINICAL DATA:  45 y/o  M; left-sided neurologic symptoms. EXAM: CT HEAD WITHOUT CONTRAST TECHNIQUE: Contiguous axial images were obtained from the base of the skull through the vertex without intravenous contrast. COMPARISON:  None. FINDINGS: Brain: Lucency within the right insula with loss of gray-white differentiation (series 3, image 14). No other large territory infarct, focal mass effect, or intracranial hemorrhage is identified. No hydrocephalus. Vascular: No  hyperdense vessel or unexpected calcification. Skull: Normal. Negative for fracture or focal lesion. Sinuses/Orbits: No acute finding. Other: None. IMPRESSION: Lucency within the right insula may represent acute/subacute infarction. Aspects is 9. These results were called by telephone at the time of interpretation on 09/07/2016 at 2:58 am to Dr. Charlesetta Ivory , who verbally acknowledged these results. Electronically Signed   By: Kristine Garbe M.D.   On: 09/07/2016 02:58   Mr Brain Wo Contrast  Result Date: 09/07/2016 CLINICAL DATA:  45 year old male with right facial droop, drooling, and altered mental status. Evidence of cytotoxic edema at the right insula on earlier head CT. Initial encounter. EXAM: MRI HEAD WITHOUT CONTRAST MRA HEAD WITHOUT CONTRAST TECHNIQUE: Multiplanar, multiecho pulse sequences of the brain and surrounding structures were obtained without intravenous contrast. Angiographic images of the head were obtained using MRA technique without contrast. COMPARISON:  Head CT without contrast 0228 hours today. FINDINGS: MRI HEAD FINDINGS Brain: 2 cm area of confluent restricted diffusion involving the mid right insula, and adjacent right frontal operculum (series 8, image 21). No basal ganglia involvement. Associated T2 and FLAIR hyperintensity. Mild insular swelling but no regional mass effect. No associated hemorrhage. No contralateral or posterior fossa restricted diffusion. Nomidline shift, mass effect, evidence of mass lesion, ventriculomegaly, extra-axial collection or acute intracranial hemorrhage. Cervicomedullary junction and pituitary are within normal limits. Outside of the acute findings above supratentorial gray and white matter signal is within normal limits. However, there are small chronic lacunar infarcts in both cerebellar hemispheres (series 13, image 10). The brainstem is normal. No chronic cerebral blood products identified. Vascular: Major intracranial vascular flow  voids are preserved. Skull and upper cervical spine: Negative. Normal bone marrow signal. Sinuses/Orbits: Right maxillary sinus mucous retention cyst. Postoperative changes to the right globe. Otherwise negative orbit soft tissues. Other: Visible internal auditory structures appear normal. Mastoids are clear. Negative scalp soft tissues. MRA HEAD FINDINGS Antegrade flow in the posterior circulation with codominant distal vertebral arteries. Normal PICA origins, vertebrobasilar junction, basilar artery, SCA and PCA origins. Posterior communicating arteries are diminutive or absent. Normal bilateral PCA branches. Antegrade flow in both ICA siphons. No siphon irregularity or stenosis. Normal ophthalmic artery origins. Normal carotid termini. Normal MCA and ACA origins. Diminutive or absent anterior communicating artery. Visualized ACA branches are within normal limits. Left MCA M1 segment, bifurcation, and visualized branches are within normal limits. Right MCA M1 segment is normal. Right MCA bifurcation appears normal. However, there is moderate to severe irregularity of a right MCA middle sylvian distal M2 or M3 branch best seen on series 19, image 14 corresponding to series 9, image 70 of the source images. This vessel remains patent. No major branch occlusion is identified. IMPRESSION: 1. Small acute infarct affecting the middle right MCA territory. Right insula and frontal operculum affected. Cytotoxic edema without hemorrhage or mass effect. 2. Associated moderate to severe irregularity  and stenosis of a right MCA middle sylvian division branch, distal M2 or M3. Negative for emergent large vessel occlusion. 3. 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 Considering the patient's age an multiple vascular territory ischemia recommend evaluation for PFO or other source of paradoxical emboli. Electronically Signed   By: Genevie Ann M.D.   On: 09/07/2016 09:26   US Carotid Bilateral (at [facility]  Only)  Result Date: 09/07/2016 CLINICAL DATA:  TIA SYMPTOMS, HYPERTENSION EXAM: BILATERAL CAROTID DUPLEX ULTRASOUND TECHNIQUE: Pearline Cables scale imaging, color Doppler and duplex ultrasound were performed of bilateral carotid and vertebral arteries in the neck. COMPARISON:  09/07/2016 brain MRI FINDINGS: Criteria: Quantification of carotid stenosis is based on velocity parameters that correlate the residual internal carotid diameter with NASCET-based stenosis levels, using the diameter of the distal internal carotid lumen as the denominator for stenosis measurement. The following velocity measurements were obtained: RIGHT ICA:  125/26 cm/sec CCA:  193/79 cm/sec SYSTOLIC ICA/CCA RATIO:  1.2 DIASTOLIC ICA/CCA RATIO:  1.0 ECA:  91 cm/sec LEFT ICA:  74/23 cm/sec CCA:  02/40 cm/sec SYSTOLIC ICA/CCA RATIO:  0.8 DIASTOLIC ICA/CCA RATIO:  0.9 ECA:  93 cm/sec RIGHT CAROTID ARTERY: No significant atherosclerotic plaque formation. No hemodynamically significant right ICA stenosis, velocity elevation, or turbulent flow. Negative for stenosis. RIGHT VERTEBRAL ARTERY:  Antegrade LEFT CAROTID ARTERY: Very minor bifurcation atherosclerotic plaque formation. No hemodynamically significant left ICA stenosis, velocity elevation, or turbulent flow. Degree of stenosis less than 50%. LEFT VERTEBRAL ARTERY:  Antegrade IMPRESSION: Minor left carotid atherosclerosis. No hemodynamically significant ICA stenosis. Patent antegrade vertebral flow bilaterally Electronically Signed   By: Jerilynn Mages.  Shick M.D.   On: 09/07/2016 09:47   Mr Jodene Nam Head/brain XB Cm  Result Date: 09/07/2016 CLINICAL DATA:  45 year old male with right facial droop, drooling, and altered mental status. Evidence of cytotoxic edema at the right insula on earlier head CT. Initial encounter. EXAM: MRI HEAD WITHOUT CONTRAST MRA HEAD WITHOUT CONTRAST TECHNIQUE: Multiplanar, multiecho pulse sequences of the brain and surrounding structures were obtained without intravenous contrast.  Angiographic images of the head were obtained using MRA technique without contrast. COMPARISON:  Head CT without contrast 0228 hours today. FINDINGS: MRI HEAD FINDINGS Brain: 2 cm area of confluent restricted diffusion involving the mid right insula, and adjacent right frontal operculum (series 8, image 21). No basal ganglia involvement. Associated T2 and FLAIR hyperintensity. Mild insular swelling but no regional mass effect. No associated hemorrhage. No contralateral or posterior fossa restricted diffusion. Nomidline shift, mass effect, evidence of mass lesion, ventriculomegaly, extra-axial collection or acute intracranial hemorrhage. Cervicomedullary junction and pituitary are within normal limits. Outside of the acute findings above supratentorial gray and white matter signal is within normal limits. However, there are small chronic lacunar infarcts in both cerebellar hemispheres (series 13, image 10). The brainstem is normal. No chronic cerebral blood products identified. Vascular: Major intracranial vascular flow voids are preserved. Skull and upper cervical spine: Negative. Normal bone marrow signal. Sinuses/Orbits: Right maxillary sinus mucous retention cyst. Postoperative changes to the right globe. Otherwise negative orbit soft tissues. Other: Visible internal auditory structures appear normal. Mastoids are clear. Negative scalp soft tissues. MRA HEAD FINDINGS Antegrade flow in the posterior circulation with codominant distal vertebral arteries. Normal PICA origins, vertebrobasilar junction, basilar artery, SCA and PCA origins. Posterior communicating arteries are diminutive or absent. Normal bilateral PCA branches. Antegrade flow in both ICA siphons. No siphon irregularity or stenosis. Normal ophthalmic artery origins. Normal carotid termini. Normal MCA and ACA  origins. Diminutive or absent anterior communicating artery. Visualized ACA branches are within normal limits. Left MCA M1 segment, bifurcation,  and visualized branches are within normal limits. Right MCA M1 segment is normal. Right MCA bifurcation appears normal. However, there is moderate to severe irregularity of a right MCA middle sylvian distal M2 or M3 branch best seen on series 19, image 14 corresponding to series 9, image 70 of the source images. This vessel remains patent. No major branch occlusion is identified. IMPRESSION: 1. Small acute infarct affecting the middle right MCA territory. Right insula and frontal operculum affected. Cytotoxic edema without hemorrhage or mass effect. 2. Associated moderate to severe irregularity and stenosis of a right MCA middle sylvian division branch, distal M2 or M3. Negative for emergent large vessel occlusion. 3. 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 Considering the patient's age an multiple vascular territory ischemia recommend evaluation for PFO or other source of paradoxical emboli. Electronically Signed   By: Genevie Ann M.D.   On: 09/07/2016 09:26    Assessment: 45 y.o. male with a history of HTN presenting with focal symptoms that have resolved for the most part.  MRI of the brain shows an acute right MCA branch infarct. Concern is for embolic etiology.  There are some chromic cerebellar infarct (small) consistent with HTN versus emboli.  MRA shows right M2, M3 stenosis.  Carotid dopplers show no evidence of hemodynamically significant stenosis.  Echocardiogram pending.  A1c pending, LDL 111.  Stroke Risk Factors - hyperlipidemia and hypertension  Plan: 1. PT consult, OT consult, Speech consult 2. Prophylactic therapy-ASA 62m and Plavix 736mdaily 3. NPO until RN stroke swallow screen 4. Telemetry monitoring 6. Frequent neuro checks 7. Statin for lipid management with target LDL<70. 8. Echocardiogram with low threshold for TEE 9. PTT, PT/INR, ESR, CRP, homocysteine, Factor V, protein C, protein S, ATIII, lupus anticoagulant, anticardiolipin antibody.      LeAlexis GoodellMD Neurology [id number]/13/2017, 11:19 AM

## 2016-09-07 NOTE — ED Triage Notes (Signed)
Pt to [facility] 19 from pharmacy where pt was found to have right sided facial droop, drooling and other staff reported pt "being out of it" since coming into work. Pt reports feeling "odd" when waking up at 2030. MD at bedside for further evaluation.

## 2016-09-07 NOTE — H&P (Signed)
[facility] NAME: Ruben Eaton    MR#:  [id number]  DATE OF BIRTH:  March 11, 1971  DATE OF ADMISSION:  09/07/2016  PRIMARY CARE PHYSICIAN: Dion Body, MD   REQUESTING/REFERRING PHYSICIAN:   CHIEF COMPLAINT:   Chief Complaint  Patient presents with  . Altered Mental Status    HISTORY OF PRESENT ILLNESS: Ruben Eaton  is a 45 y.o. 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 Patient also said he felt altered when waking up yesterday around 2030. No complaints of any tingling or numbness in any part of the body. No complaints of any weakness in any part of the body. Had some slurred speech around 2 AM this morning when the patient was first noticed. After patient was evaluated in the emergency room, his facial droop has resolved and there is no slurred speech. Patient was worked up with a CT head in the emergency room which showed a lucency in the right insular area. Telemetry neurology consultation was done who recommended admission and completed workup for stroke. Patient never had any stroke in the past. Patient regularly exercises and maintains a healthy lifestyle. No complaints of any chest pain, shortness of breath, palpitations. No history of any syncope or any witnessed seizure.  PAST MEDICAL HISTORY:   Past Medical History:  Diagnosis Date  . Hypertension     PAST SURGICAL HISTORY: History reviewed. No pertinent surgical history.  SOCIAL HISTORY:  Social History  Substance Use Topics  . Smoking status: Never Smoker  . Smokeless tobacco: Never Used  . Alcohol use No    FAMILY HISTORY: History reviewed. No pertinent family history.  DRUG ALLERGIES: No Known Allergies  REVIEW OF SYSTEMS:   CONSTITUTIONAL: No fever, fatigue  or weakness.  EYES: No blurred or double vision.  EARS, NOSE, AND THROAT: No tinnitus or ear pain.  RESPIRATORY: No cough, shortness of breath, wheezing or hemoptysis.  CARDIOVASCULAR: No chest pain, orthopnea, edema.  GASTROINTESTINAL: No nausea, vomiting, diarrhea or abdominal pain.  GENITOURINARY: No dysuria, hematuria.  ENDOCRINE: No polyuria, nocturia,  HEMATOLOGY: No anemia, easy bruising or bleeding SKIN: No rash or lesion. MUSCULOSKELETAL: No joint pain or arthritis.   NEUROLOGIC: No tingling, numbness, weakness.  Had facial droop and slurred speech initially PSYCHIATRY: No anxiety or depression.   MEDICATIONS AT HOME:  Prior to Admission medications   Medication Sig Start Date End Date Taking? Authorizing Provider  b complex vitamins capsule Take 1 capsule by mouth daily.    Yes Historical Provider, MD  Diclofenac Sodium 2 % SOLN Apply 1 pump twice daily as needed. 02/01/16  Yes Lyndal Pulley, DO  ibuprofen (ADVIL,MOTRIN) 200 MG tablet Take 200 mg by mouth every 6 (six) hours as needed.    Yes Historical Provider, MD  lisinopril-hydrochlorothiazide (PRINZIDE,ZESTORETIC) 20-12.5 MG tablet Take 0.5 tablets by mouth daily.  08/24/15  Yes Historical Provider, MD  loratadine (CLARITIN) 10 MG tablet Take 10 mg by mouth daily.    Yes Historical Provider, MD  tadalafil (CIALIS) 5 MG tablet Take 5 mg by mouth daily as needed.  11/10/14  Yes Historical Provider, MD  Vitamin D, Ergocalciferol, (DRISDOL) 50000 units CAPS capsule Take 1 capsule (50,000 Units total) by mouth every 7 (seven) days. 05/05/16  Yes Lyndal Pulley, DO  zolpidem (  AMBIEN) 10 MG tablet 10 mg at bedtime.  12/10/15  Yes Historical Provider, MD  nitroGLYCERIN (NITRO-DUR) 0.2 mg/hr patch Apply 1/4 patch onto the skin daily. Patient not taking: Reported on 09/07/2016 03/01/16   Lyndal Pulley, DO      PHYSICAL EXAMINATION:   VITAL SIGNS: Blood pressure 129/80, pulse 80, temperature 98 F (36.7 C), resp. rate 18, weight  76.2 kg (168 lb), SpO2 99 %.  GENERAL:  45 y.o.-year-old patient lying in the bed with no acute distress.  EYES: Pupils equal, round, reactive to light and accommodation. No scleral icterus. Extraocular muscles intact.  HEENT: Head atraumatic, normocephalic. Oropharynx and nasopharynx clear.  NECK:  Supple, no jugular venous distention. No thyroid enlargement, no tenderness.  LUNGS: Normal breath sounds bilaterally, no wheezing, rales,rhonchi or crepitation. No use of accessory muscles of respiration.  CARDIOVASCULAR: S1, S2 normal. No murmurs, rubs, or gallops.  ABDOMEN: Soft, nontender, nondistended. Bowel sounds present. No organomegaly or mass.  EXTREMITIES: No pedal edema, cyanosis, or clubbing.  NEUROLOGIC: Cranial nerves II through XII are intact. Muscle strength 5/5 in all extremities. Sensation intact. Gait not checked.  PSYCHIATRIC: The patient is alert and oriented x 3.  SKIN: No obvious rash, lesion, or ulcer.   LABORATORY PANEL:   CBC  Recent Labs Lab 09/07/16 0219  WBC 8.8  HGB 15.1  HCT 42.5  PLT 270  MCV 92.4  MCH 32.9  MCHC 35.6  RDW 13.5   ------------------------------------------------------------------------------------------------------------------  Chemistries   Recent Labs Lab 09/07/16 0307  NA 133*  K 4.1  CL 99*  CO2 26  GLUCOSE 143*  BUN 15  CREATININE 1.03  CALCIUM 9.5   ------------------------------------------------------------------------------------------------------------------ CrCl cannot be calculated (Unknown ideal weight.). ------------------------------------------------------------------------------------------------------------------ No results for input(s): TSH, T4TOTAL, T3FREE, THYROIDAB in the last 72 hours.  Invalid input(s): FREET3   Coagulation profile No results for input(s): INR, PROTIME in the last 168  hours. ------------------------------------------------------------------------------------------------------------------- No results for input(s): DDIMER in the last 72 hours. -------------------------------------------------------------------------------------------------------------------  Cardiac Enzymes  Recent Labs Lab 09/07/16 0307  TROPONINI <0.03   ------------------------------------------------------------------------------------------------------------------ Invalid input(s): POCBNP  ---------------------------------------------------------------------------------------------------------------  Urinalysis No results found for: COLORURINE, APPEARANCEUR, LABSPEC, PHURINE, GLUCOSEU, HGBUR, BILIRUBINUR, KETONESUR, PROTEINUR, UROBILINOGEN, NITRITE, LEUKOCYTESUR   RADIOLOGY: Ct Head Wo Contrast  Result Date: 09/07/2016 CLINICAL DATA:  45 y/o  M; left-sided neurologic symptoms. EXAM: CT HEAD WITHOUT CONTRAST TECHNIQUE: Contiguous axial images were obtained from the base of the skull through the vertex without intravenous contrast. COMPARISON:  None. FINDINGS: Brain: Lucency within the right insula with loss of gray-white differentiation (series 3, image 14). No other large territory infarct, focal mass effect, or intracranial hemorrhage is identified. No hydrocephalus. Vascular: No hyperdense vessel or unexpected calcification. Skull: Normal. Negative for fracture or focal lesion. Sinuses/Orbits: No acute finding. Other: None. IMPRESSION: Lucency within the right insula may represent acute/subacute infarction. Aspects is 9. These results were called by telephone at the time of interpretation on 09/07/2016 at 2:58 am to Dr. Charlesetta Ivory , who verbally acknowledged these results. Electronically Signed   By: Kristine Garbe M.D.   On: 09/07/2016 02:58    EKG: Orders placed or performed during the hospital encounter of 09/07/16  . ED EKG  . ED EKG  . EKG 12-Lead  . EKG  12-Lead    IMPRESSION AND PLAN: 45 year old male patient with history of hypertension who works as Software engineer at [employer] presented to the emergency room with facial droop. Admitting diagnosis 1. Transient ischemic attack 2. Hypertension 3.  Mild hyponatremia Treatment plan Admit patient to medical floor observation bed Start patient on aspirin MRI and MRA brain to assess for CVA Bilateral vascular carotid ultrasound to rule out obstruction Check lipid panel Check echocardiogram Neurology consultation Neuro checks Supportive care.  All the records are reviewed and case discussed with ED provider. Management plans discussed with the patient, family and they are in agreement.  CODE STATUS:FULL CODE Code Status History    This patient does not have a recorded code status. Please follow your organizational policy for patients in this situation.       TOTAL TIME TAKING CARE OF THIS PATIENT: 50 minutes.    Saundra Shelling M.D on 09/07/2016 at 4:51 AM  Between 7am to 6pm - Pager - [phone]  After 6pm go to [email or web address] - password [email or web address] [facility]  Office  [phone]  CC: Primary care physician; Dion Body, MD

## 2016-09-07 NOTE — ED Provider Notes (Addendum)
[facility] Emergency Department Provider Note   ____________________________________________   First MD Initiated Contact with Patient 09/07/16 0210     (approximate)  I have reviewed the triage vital signs and the nursing notes.   HISTORY  Chief Complaint Altered Mental Status    HPI Ruben Eaton is a 45 y.o. male who comes into the hospital today with some changed behavior and facial drooping. The patient reports that he's been feeling out of sorts earlier. He's had some decreased appetite. 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 The patient's technician reported that his face looked tripping on the right side. She reports that he's been out of sorts and not himself since he arrived in the hospital. He denies any weakness and denies slurred speech. The patient denies chest pain, shortness of breath, nausea, vomiting, dizziness, lightheadedness, abdominal pain. The patient reports that there has been sick people in the pharmacy so he has had many sick contacts. He has not had any cough, runny nose or sore throat. He reports that he feels okay just out of sorts. The staff stated that the patient was trying to drink and he was drooling out of the side of his mouth. He is here for evaluation. She reports that his face looked droopy at 2 AM but that he's been acting abnormal since he arrived.   Past Medical History:  Diagnosis Date  . Hypertension     Patient Active Problem List   Diagnosis Date Noted  . TIA (transient ischemic attack) 09/07/2016  . 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 Essential (primary) hypertension 02/01/2016  . H/O disease 02/01/2016  . Quadriceps tendinitis 02/01/2016    History reviewed. No pertinent surgical history.  Prior to Admission medications   Medication Sig Start Date End Date Taking? Authorizing Provider  b complex vitamins capsule Take 1 capsule by mouth daily.    Yes Historical Provider, MD    Diclofenac Sodium 2 % SOLN Apply 1 pump twice daily as needed. 02/01/16  Yes Lyndal Pulley, DO  ibuprofen (ADVIL,MOTRIN) 200 MG tablet Take 200 mg by mouth every 6 (six) hours as needed.    Yes Historical Provider, MD  lisinopril-hydrochlorothiazide (PRINZIDE,ZESTORETIC) 20-12.5 MG tablet Take 0.5 tablets by mouth daily.  08/24/15  Yes Historical Provider, MD  loratadine (CLARITIN) 10 MG tablet Take 10 mg by mouth daily.    Yes Historical Provider, MD  tadalafil (CIALIS) 5 MG tablet Take 5 mg by mouth daily as needed.  11/10/14  Yes Historical Provider, MD  Vitamin D, Ergocalciferol, (DRISDOL) 50000 units CAPS capsule Take 1 capsule (50,000 Units total) by mouth every 7 (seven) days. 05/05/16  Yes Lyndal Pulley, DO  zolpidem (AMBIEN) 10 MG tablet 10 mg at bedtime.  12/10/15  Yes Historical Provider, MD  nitroGLYCERIN (NITRO-DUR) 0.2 mg/hr patch Apply 1/4 patch onto the skin daily. Patient not taking: Reported on 09/07/2016 03/01/16   Lyndal Pulley, DO    Allergies Patient has no known allergies.  History reviewed. No pertinent family history.  Social History Social History  Substance Use Topics  . Smoking status: Never Smoker  . Smokeless tobacco: Never Used  . Alcohol use No    Review of Systems Constitutional: No fever/chills Eyes: No visual changes. ENT: No sore throat. Cardiovascular: Denies chest pain. Respiratory: Denies shortness of breath. Gastrointestinal: No abdominal pain.  No nausea, no vomiting.  No diarrhea.  No constipation. Genitourinary: Negative for dysuria. Musculoskeletal: Negative for back pain.  Skin: Negative for rash. Neurological: Facial drooping.  10-point ROS otherwise negative.  ____________________________________________   PHYSICAL EXAM:  VITAL SIGNS: ED Triage Vitals  Enc Vitals Group     BP 09/07/16 0217 (!) 146/86     Pulse Rate 09/07/16 0217 93     Resp 09/07/16 0217 15     Temp 09/07/16 0217 98 F (36.7 C)     Temp Source 09/07/16  0217 Oral     SpO2 09/07/16 0217 100 %     Weight 09/07/16 0218 168 lb (76.2 kg)     Height --      Head Circumference --      Peak Flow --      Pain Score --      Pain Loc --      Pain Edu? --      Excl. in [place]? --     Constitutional: Alert and oriented. Well appearing and in no acute distress. Eyes: Conjunctivae are normal. PERRL. EOMI. Head: Atraumatic. Nose: No congestion/rhinnorhea. Mouth/Throat: Mucous membranes are moist.  Oropharynx non-erythematous. Cardiovascular: Normal rate, regular rhythm. Grossly normal heart sounds.  Good peripheral circulation. Respiratory: Normal respiratory effort.  No retractions. Lungs CTAB. Gastrointestinal: Soft and nontender. No distention. Positive bowel sounds Musculoskeletal: No lower extremity tenderness nor edema.   Neurologic:  Normal speech and language. Cranial nerves II through XII are grossly intact with no focal motor or neuro deficits. The patient does have some slight drooping to the left lip. The patient also has some slight left pronator drift. Otherwise his strength is 5 out of 5 in his upper and lower extremities. The patient reports that the sensation is fully intact. He has no speech difficulties or word finding difficulties. Skin:  Skin is warm, dry and intact.  Psychiatric: Mood and affect are normal.   ____________________________________________   LABS (all labs ordered are listed, but only abnormal results are displayed)  Labs Reviewed  BASIC METABOLIC PANEL - Abnormal; Notable for the following:       Result Value   Sodium 133 (*)    Chloride 99 (*)    Glucose, Bld 143 (*)    All other components within normal limits  CBC  TROPONIN I   ____________________________________________  EKG  ED ECG REPORT I, Loney Hering, the attending physician, personally viewed and interpreted this ECG.   Date: 09/07/2016  EKG Time: 217  Rate: 92  Rhythm: normal sinus rhythm  Axis: normal  Intervals:none  ST&T  Change: none  ____________________________________________  RADIOLOGY  CT head ____________________________________________   PROCEDURES  Procedure(s) performed: None  Procedures  Critical Care performed: No  ____________________________________________   INITIAL IMPRESSION / ASSESSMENT AND PLAN / ED COURSE  Pertinent labs & imaging results that were available during my care of the patient were reviewed by me and considered in my medical decision making (see chart for details).  This is a 45 year old male who comes into the hospital today with some left-sided facial droop and change in his behavior today. The patient reports that he feels well but he does have some mild neurologic deficits. Although the patient's facial droop seemed to start around 2 AM he has been out of sorts and not acting himself. I did not call a code stroke but I will have the tele neurologist evaluate the patient. He will also receive a CT scan. The patient's blood work is unremarkable. His NIH stroke scale is 2 for the pronator drift and the facial droop.  Clinical Course as of Sep 07 521  Wed Sep 07, 2016  0343 Lucency within the right insula may represent acute/subacute infarction. Aspects is 9.  These results were called by telephone at the time of interpretation on 09/07/2016 at 2:58 am to Dr. Charlesetta Ivory , who verbally acknowledged these results.   CT Head Wo Contrast [AW]    Clinical Course User Index [AW] Loney Hering, MD   The patient was seen by the tele neurologist who did not see any deficit in the patient's symptoms. He was concerned about a TIA with the patient. I received a phone call that the patient had a lucency with dizziness right insula with a concern for an acute versus subacute stroke. The patient had a swallow study that was normal. The patient received aspirin 324 mg. The patient will be admitted to the hospitalist service for further evaluation. Given the  improvement in his symptoms the patient is not a candidate for TPA at this time.  ____________________________________________   FINAL CLINICAL IMPRESSION(S) / ED DIAGNOSES  Final diagnoses:  Cerebrovascular accident (CVA), unspecified mechanism ([place])  Facial droop      NEW MEDICATIONS STARTED DURING THIS VISIT:  New Prescriptions   No medications on file     Note:  This document was prepared using Dragon voice recognition software and may include unintentional dictation errors.    Loney Hering, MD 09/07/16 0522    Loney Hering, MD 09/07/16 [phone]

## 2016-09-07 NOTE — Progress Notes (Signed)
SLP Cancellation Note  Patient Details Name: TREMAINE EARWOOD MRN: [id number] DOB: 18-Oct-1970   Cancelled treatment:       Reason Eval/Treat Not Completed: SLP screened, no needs identified, will sign off (chart reviewed; consulted NSG.) Met w/ pt and family in room. Pt denied any swallowing deficits w/ current regular diet/meals and any speech-language/Cognitiion deficits this morning. Pt endorsed feeling "tired". Family agreed. Pt is communicating w/ all staff at conversational level. Recommended pt rest and if any noted deficits in Cognitive-linguistic issues post returning home, then to seek referral for an Outpatient assessment. Pt agreed.     Orinda Kenner, MS, CCC-SLP Tyden Kann 09/07/2016, 2:33 PM

## 2016-09-07 NOTE — Progress Notes (Signed)
Patient ID: KAM RAHIMI, male   DOB: 1971/06/14, 45 y.o.   MRN: [id number]  [employer] [facility] PROGRESS NOTE  Ruben Eaton:[id number] DOB: 15-Dec-1970 DOA: 09/07/2016 PCP: Dion Body, MD  HPI/Subjective: Patient feeling better. Came in last night to work not feeling right and facial droop. He was sent into the ER. Now feeling better.  Objective: Vitals:   09/07/16 0430 09/07/16 0527  BP: 114/85 128/76  Pulse: 76 69  Resp: 16 18  Temp:  98 F (36.7 C)    Filed Weights   09/07/16 0218 09/07/16 0527  Weight: 76.2 kg (168 lb) 76.5 kg (168 lb 11.2 oz)    ROS: Review of Systems  Constitutional: Negative for chills and fever.  Eyes: Negative for blurred vision.  Respiratory: Negative for cough and shortness of breath.   Cardiovascular: Negative for chest pain.  Gastrointestinal: Negative for abdominal pain, constipation, diarrhea, nausea and vomiting.  Genitourinary: Negative for dysuria.  Musculoskeletal: Negative for joint pain.  Neurological: Negative for dizziness and headaches.   Exam: Physical Exam  Constitutional: He is oriented to person, place, and time.  HENT:  Nose: No mucosal edema.  Mouth/Throat: No oropharyngeal exudate or posterior oropharyngeal edema.  Eyes: Conjunctivae, EOM and lids are normal. Pupils are equal, round, and reactive to light.  Patient always has one of his eyes deviated in.  Neck: No JVD present. Carotid bruit is not present. No edema present. No thyroid mass and no thyromegaly present.  Cardiovascular: S1 normal and S2 normal.  Exam reveals no gallop.   Murmur heard.  Systolic murmur is present with a grade of 2/6  Pulses:      Dorsalis pedis pulses are 2+ on the right side, and 2+ on the left side.  Respiratory: No respiratory distress. He has no wheezes. He has no rhonchi. He has no rales.  GI: Soft. Bowel sounds are normal. There is no tenderness.  Musculoskeletal:       Right ankle: He exhibits no swelling.   Left ankle: He exhibits no swelling.  Lymphadenopathy:    He has no cervical adenopathy.  Neurological: He is alert and oriented to person, place, and time. No cranial nerve deficit.  Patient only says one of his eyes deviated in but extraocular muscles intact.  Skin: Skin is warm. No rash noted. Nails show no clubbing.  Psychiatric: He has a normal mood and affect.      Data Reviewed: Basic Metabolic Panel:  Recent Labs Lab 09/07/16 0307  NA 133*  K 4.1  CL 99*  CO2 26  GLUCOSE 143*  BUN 15  CREATININE 1.03  CALCIUM 9.5   CBC:  Recent Labs Lab 09/07/16 0219  WBC 8.8  HGB 15.1  HCT 42.5  MCV 92.4  PLT 270   Cardiac Enzymes:  Recent Labs Lab 09/07/16 0307  TROPONINI <0.03     Studies: Ct Head Wo Contrast  Result Date: 09/07/2016 CLINICAL DATA:  45 y/o  M; left-sided neurologic symptoms. EXAM: CT HEAD WITHOUT CONTRAST TECHNIQUE: Contiguous axial images were obtained from the base of the skull through the vertex without intravenous contrast. COMPARISON:  None. FINDINGS: Brain: Lucency within the right insula with loss of gray-white differentiation (series 3, image 14). No other large territory infarct, focal mass effect, or intracranial hemorrhage is identified. No hydrocephalus. Vascular: No hyperdense vessel or unexpected calcification. Skull: Normal. Negative for fracture or focal lesion. Sinuses/Orbits: No acute finding. Other: None. IMPRESSION: Lucency within the right insula may represent acute/subacute infarction.  Aspects is 45. These results were called by telephone at the time of interpretation on 09/07/2016 at 2:58 am to Dr. Charlesetta Ivory , who verbally acknowledged these results. Electronically Signed   By: Kristine Garbe M.D.   On: 09/07/2016 02:58   Mr Brain Wo Contrast  Result Date: 09/07/2016 CLINICAL DATA:  45 year old male with right facial droop, drooling, and altered mental status. Evidence of cytotoxic edema at the right insula on  earlier head CT. Initial encounter. EXAM: MRI HEAD WITHOUT CONTRAST MRA HEAD WITHOUT CONTRAST TECHNIQUE: Multiplanar, multiecho pulse sequences of the brain and surrounding structures were obtained without intravenous contrast. Angiographic images of the head were obtained using MRA technique without contrast. COMPARISON:  Head CT without contrast 0228 hours today. FINDINGS: MRI HEAD FINDINGS Brain: 2 cm area of confluent restricted diffusion involving the mid right insula, and adjacent right frontal operculum (series 8, image 21). No basal ganglia involvement. Associated T2 and FLAIR hyperintensity. Mild insular swelling but no regional mass effect. No associated hemorrhage. No contralateral or posterior fossa restricted diffusion. Nomidline shift, mass effect, evidence of mass lesion, ventriculomegaly, extra-axial collection or acute intracranial hemorrhage. Cervicomedullary junction and pituitary are within normal limits. Outside of the acute findings above supratentorial gray and white matter signal is within normal limits. However, there are small chronic lacunar infarcts in both cerebellar hemispheres (series 13, image 10). The brainstem is normal. No chronic cerebral blood products identified. Vascular: Major intracranial vascular flow voids are preserved. Skull and upper cervical spine: Negative. Normal bone marrow signal. Sinuses/Orbits: Right maxillary sinus mucous retention cyst. Postoperative changes to the right globe. Otherwise negative orbit soft tissues. Other: Visible internal auditory structures appear normal. Mastoids are clear. Negative scalp soft tissues. MRA HEAD FINDINGS Antegrade flow in the posterior circulation with codominant distal vertebral arteries. Normal PICA origins, vertebrobasilar junction, basilar artery, SCA and PCA origins. Posterior communicating arteries are diminutive or absent. Normal bilateral PCA branches. Antegrade flow in both ICA siphons. No siphon irregularity or  stenosis. Normal ophthalmic artery origins. Normal carotid termini. Normal MCA and ACA origins. Diminutive or absent anterior communicating artery. Visualized ACA branches are within normal limits. Left MCA M1 segment, bifurcation, and visualized branches are within normal limits. Right MCA M1 segment is normal. Right MCA bifurcation appears normal. However, there is moderate to severe irregularity of a right MCA middle sylvian distal M2 or M3 branch best seen on series 19, image 14 corresponding to series 9, image 70 of the source images. This vessel remains patent. No major branch occlusion is identified. IMPRESSION: 1. Small acute infarct affecting the middle right MCA territory. Right insula and frontal operculum affected. Cytotoxic edema without hemorrhage or mass effect. 2. Associated moderate to severe irregularity and stenosis of a right MCA middle sylvian division branch, distal M2 or M3. Negative for emergent large vessel occlusion. 3. 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 Considering the patient's age an multiple vascular territory ischemia recommend evaluation for PFO or other source of paradoxical emboli. Electronically Signed   By: Genevie Ann M.D.   On: 09/07/2016 09:26   US Carotid Bilateral (at [facility] Only)  Result Date: 09/07/2016 CLINICAL DATA:  TIA SYMPTOMS, HYPERTENSION EXAM: BILATERAL CAROTID DUPLEX ULTRASOUND TECHNIQUE: Pearline Cables scale imaging, color Doppler and duplex ultrasound were performed of bilateral carotid and vertebral arteries in the neck. COMPARISON:  09/07/2016 brain MRI FINDINGS: Criteria: Quantification of carotid stenosis is based on velocity parameters that correlate the residual internal carotid diameter with NASCET-based stenosis levels,  using the diameter of the distal internal carotid lumen as the denominator for stenosis measurement. The following velocity measurements were obtained: RIGHT ICA:  125/26 cm/sec CCA:  244/01 cm/sec SYSTOLIC ICA/CCA  RATIO:  1.2 DIASTOLIC ICA/CCA RATIO:  1.0 ECA:  91 cm/sec LEFT ICA:  74/23 cm/sec CCA:  02/72 cm/sec SYSTOLIC ICA/CCA RATIO:  0.8 DIASTOLIC ICA/CCA RATIO:  0.9 ECA:  93 cm/sec RIGHT CAROTID ARTERY: No significant atherosclerotic plaque formation. No hemodynamically significant right ICA stenosis, velocity elevation, or turbulent flow. Negative for stenosis. RIGHT VERTEBRAL ARTERY:  Antegrade LEFT CAROTID ARTERY: Very minor bifurcation atherosclerotic plaque formation. No hemodynamically significant left ICA stenosis, velocity elevation, or turbulent flow. Degree of stenosis less than 50%. LEFT VERTEBRAL ARTERY:  Antegrade IMPRESSION: Minor left carotid atherosclerosis. No hemodynamically significant ICA stenosis. Patent antegrade vertebral flow bilaterally Electronically Signed   By: Jerilynn Mages.  Shick M.D.   On: 09/07/2016 09:47   Mr Jodene Nam Head/brain ZD Cm  Result Date: 09/07/2016 CLINICAL DATA:  45 year old male with right facial droop, drooling, and altered mental status. Evidence of cytotoxic edema at the right insula on earlier head CT. Initial encounter. EXAM: MRI HEAD WITHOUT CONTRAST MRA HEAD WITHOUT CONTRAST TECHNIQUE: Multiplanar, multiecho pulse sequences of the brain and surrounding structures were obtained without intravenous contrast. Angiographic images of the head were obtained using MRA technique without contrast. COMPARISON:  Head CT without contrast 0228 hours today. FINDINGS: MRI HEAD FINDINGS Brain: 2 cm area of confluent restricted diffusion involving the mid right insula, and adjacent right frontal operculum (series 8, image 21). No basal ganglia involvement. Associated T2 and FLAIR hyperintensity. Mild insular swelling but no regional mass effect. No associated hemorrhage. No contralateral or posterior fossa restricted diffusion. Nomidline shift, mass effect, evidence of mass lesion, ventriculomegaly, extra-axial collection or acute intracranial hemorrhage. Cervicomedullary junction and pituitary  are within normal limits. Outside of the acute findings above supratentorial gray and white matter signal is within normal limits. However, there are small chronic lacunar infarcts in both cerebellar hemispheres (series 13, image 10). The brainstem is normal. No chronic cerebral blood products identified. Vascular: Major intracranial vascular flow voids are preserved. Skull and upper cervical spine: Negative. Normal bone marrow signal. Sinuses/Orbits: Right maxillary sinus mucous retention cyst. Postoperative changes to the right globe. Otherwise negative orbit soft tissues. Other: Visible internal auditory structures appear normal. Mastoids are clear. Negative scalp soft tissues. MRA HEAD FINDINGS Antegrade flow in the posterior circulation with codominant distal vertebral arteries. Normal PICA origins, vertebrobasilar junction, basilar artery, SCA and PCA origins. Posterior communicating arteries are diminutive or absent. Normal bilateral PCA branches. Antegrade flow in both ICA siphons. No siphon irregularity or stenosis. Normal ophthalmic artery origins. Normal carotid termini. Normal MCA and ACA origins. Diminutive or absent anterior communicating artery. Visualized ACA branches are within normal limits. Left MCA M1 segment, bifurcation, and visualized branches are within normal limits. Right MCA M1 segment is normal. Right MCA bifurcation appears normal. However, there is moderate to severe irregularity of a right MCA middle sylvian distal M2 or M3 branch best seen on series 19, image 14 corresponding to series 9, image 70 of the source images. This vessel remains patent. No major branch occlusion is identified. IMPRESSION: 1. Small acute infarct affecting the middle right MCA territory. Right insula and frontal operculum affected. Cytotoxic edema without hemorrhage or mass effect. 2. Associated moderate to severe irregularity and stenosis of a right MCA middle sylvian division branch, distal M2 or M3.  Negative for emergent large vessel occlusion. 3.  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 Considering the patient's age an multiple vascular territory ischemia recommend evaluation for PFO or other source of paradoxical emboli. Electronically Signed   By: Genevie Ann M.D.   On: 09/07/2016 09:26    Scheduled Meds: . [START ON 09/08/2016] aspirin EC  81 mg Oral Daily  . atorvastatin  40 mg Oral q1800  . b complex vitamins  1 capsule Oral Daily  . clopidogrel  75 mg Oral Daily  . enoxaparin (LOVENOX) injection  40 mg Subcutaneous Q24H  . [START ON 09/12/2016] Vitamin D (Ergocalciferol)  50,000 Units Oral Q7 days    Assessment/Plan:  1. Acute stroke with resolving symptoms. Patient had facial droop and left upper extremity weakness initially. Aspirin, Plavix and statin prescribed. Case discussed with cardiology echocardiogram shows a positive PFO or ASD. I will get an ultrasound of the lower extremities to rule out DVT. TEE tomorrow to evaluate for PFO or ASD.  Since the patient is a young patient can be referred over to Dr. Gerrit Halls [facility] cardiology in [place] for potential closure of this defect. Neurology following. Hypercoagulable workup sent off. 2. Essential hypertension continue to hold medications 3. Hyperlipidemia unspecified. LDL 111. Start Lipitor 4. Impaired fasting glucose. check a hemoglobin A1c  Code Status:     Code Status Orders        Start     Ordered   09/07/16 0534  Full code  Continuous     09/07/16 0533    Code Status History    Date Active Date Inactive Code Status Order ID Comments User Context   This patient has a current code status but no historical code status.     Family Communication: Family at the bedside Disposition Plan: Continue potential he go home tomorrow after ultrasound of the lower extremities and TEE done  Consultants:  Neurology  Procedures:  TEE for tomorrow  Time spent: 40 minutes in coordination of care in  speaking with specialist (cardiology and neurology)  [place], [place]

## 2016-09-07 NOTE — ED Notes (Signed)
Pt requested peanut butter and saltine crackers. Dr. Dahlia Client allowed the Pt to have some since he passed his stroke swallow test.

## 2016-09-07 NOTE — Evaluation (Signed)
Physical Therapy Evaluation Patient Details Name: Ruben Eaton MRN: [id number] DOB: 1971/01/08 Today's Date: 09/07/2016   History of Present Illness  45 year old male patient with history of hypertension who works as Software engineer at [employer] presented to the emergency room with facial droop.  Clinical Impression  Pt presents to PT at baseline strength and functional mobility and and has no further acute PT needs.  Pt is independent with all bed mobility and transfers without device and ambulates 300' with normal cadence and gait sequence.  Pt following all commands appropriately.  Educated pt on stroke recovery and to pay attention to his balance and strength as he returns to his daily activities.       Follow Up Recommendations No PT follow up    Equipment Recommendations  None recommended by PT    Recommendations for Other Services       Precautions / Restrictions Precautions Precautions: Fall Precaution Comments: LOW Restrictions Weight Bearing Restrictions: No      Mobility  Bed Mobility Overal bed mobility: Independent                Transfers Overall transfer level: Independent Equipment used: None             General transfer comment: Sit<>stand with good pacing and body mechanics, no deficits.  Ambulation/Gait Ambulation/Gait assistance: Independent Ambulation Distance (Feet): 300 Feet Assistive device: None Gait Pattern/deviations: WFL(Within Functional Limits)     General Gait Details: Steady gait with no deviations, able to dual task and good awareness of surroundings.  Stairs Stairs: Yes Stairs assistance: Independent Stair Management: No rails Number of Stairs: 14 General stair comments: Reciporcal steppingt, no deficits.  Wheelchair Mobility    Modified Rankin (Stroke Patients Only)       Balance Overall balance assessment: Independent                               Standardized Balance  Assessment Standardized Balance Assessment :  (able to SLS x 10 seconds bilaterally)           Pertinent Vitals/Pain Pain Assessment: No/denies pain    Home Living Family/patient expects to be discharged to:: Private residence Living Arrangements: Spouse/significant other   Type of Home: House Home Access: Stairs to enter Entrance Stairs-Rails: None Technical brewer of Steps: 4 Home Layout: Two level Home Equipment: None Additional Comments: basement    Prior Function Level of Independence: Independent         Comments: Enjoys working out and running; works nights as Public relations account executive        Extremity/Trunk Assessment   Upper Extremity Assessment Upper Extremity Assessment: Overall WFL for tasks assessed    Lower Extremity Assessment Lower Extremity Assessment: Overall WFL for tasks assessed       Communication   Communication: No difficulties  Cognition Arousal/Alertness: Awake/alert Behavior During Therapy: WFL for tasks assessed/performed Overall Cognitive Status: Within Functional Limits for tasks assessed                      General Comments      Exercises     Assessment/Plan    PT Assessment Patent does not need any further PT services  PT Problem List            PT Treatment Interventions      PT Goals (Current goals can be found in the Care Plan  section)  Acute Rehab PT Goals Patient Stated Goal: To be able to get back to exercising. PT Goal Formulation: With patient Time For Goal Achievement: 09/07/16 Potential to Achieve Goals: Good    Frequency     Barriers to discharge        Co-evaluation               End of Session   Activity Tolerance: Patient tolerated treatment well Patient left: in bed;with call bell/phone within reach;with family/visitor present Nurse Communication: Mobility status    Functional Assessment Tool Used: AMPAC Functional Limitation: Mobility: Walking and moving  around Mobility: Walking and Moving Around Current Status (C7893): 0 percent impaired, limited or restricted Mobility: Walking and Moving Around Goal Status [phone]): 0 percent impaired, limited or restricted Mobility: Walking and Moving Around Discharge Status [phone]): 0 percent impaired, limited or restricted    Time: 1105-1130 PT Time Calculation (min) (ACUTE ONLY): 25 min   Charges:   PT Evaluation $PT Eval Low Complexity: 1 Procedure PT Treatments $Therapeutic Activity: 8-22 mins   PT G Codes:   PT G-Codes **NOT FOR INPATIENT CLASS** Functional Assessment Tool Used: AMPAC Functional Limitation: Mobility: Walking and moving around Mobility: Walking and Moving Around Current Status (E5277): 0 percent impaired, limited or restricted Mobility: Walking and Moving Around Goal Status (O2423): 0 percent impaired, limited or restricted Mobility: Walking and Moving Around Discharge Status (N3614): 0 percent impaired, limited or restricted    Ruben Eaton, PT 09/07/2016, 12:08 PM

## 2016-09-07 NOTE — Progress Notes (Signed)
*  PRELIMINARY RESULTS* Echocardiogram 2D Echocardiogram has been performed.  Ruben Eaton 09/07/2016, 1:25 PM

## 2016-09-08 ENCOUNTER — Encounter
Admission: EM | Disposition: A | Discharge status: Home / Self Care | Payer: Self-pay | Source: Home / Self Care | Attending: Internal Medicine

## 2016-09-08 ENCOUNTER — Telehealth: Payer: Self-pay | Admitting: Cardiovascular Disease

## 2016-09-08 ENCOUNTER — Inpatient Hospital Stay (HOSPITAL_COMMUNITY)
Admit: 2016-09-08 | Discharge: 2016-09-08 | Disposition: A | Discharge status: Home / Self Care | Payer: 59 | Attending: Internal Medicine | Admitting: Internal Medicine

## 2016-09-08 ENCOUNTER — Inpatient Hospital Stay: Payer: 59

## 2016-09-08 DIAGNOSIS — I639 Cerebral infarction, unspecified: Secondary | ICD-10-CM

## 2016-09-08 HISTORY — PX: TEE WITHOUT CARDIOVERSION: SHX5443

## 2016-09-08 LAB — LUPUS ANTICOAGULANT PANEL
DRVVT: 33.8 s (ref 0.0–47.0)
PTT Lupus Anticoagulant: 33.6 s (ref 0.0–51.9)

## 2016-09-08 LAB — HEMOGLOBIN A1C
HEMOGLOBIN A1C: 5.3 % (ref 4.8–5.6)
Hgb A1c MFr Bld: 5.1 % (ref 4.8–5.6)
MEAN PLASMA GLUCOSE: 100 mg/dL
MEAN PLASMA GLUCOSE: 105 mg/dL

## 2016-09-08 LAB — HOMOCYSTEINE: Homocysteine: 7.5 umol/L (ref 0.0–15.0)

## 2016-09-08 LAB — PROTEIN S, TOTAL: Protein S Ag, Total: 86 % (ref 60–150)

## 2016-09-08 SURGERY — ECHOCARDIOGRAM, TRANSESOPHAGEAL
Anesthesia: Moderate Sedation

## 2016-09-08 MED ORDER — CLOPIDOGREL BISULFATE 75 MG PO TABS: 75.0000 mg | ORAL_TABLET | Freq: Every day | ORAL | 0 refills | Status: DC

## 2016-09-08 MED ORDER — BUTAMBEN-TETRACAINE-BENZOCAINE 2-2-14 % EX AERO
INHALATION_SPRAY | CUTANEOUS | Status: AC
  Filled 2016-09-08: qty 20

## 2016-09-08 MED ORDER — SODIUM CHLORIDE FLUSH 0.9 % IV SOLN
INTRAVENOUS | Status: AC
  Filled 2016-09-08: qty 10

## 2016-09-08 MED ORDER — SODIUM CHLORIDE 0.9 % IV SOLN: INTRAVENOUS | Status: DC

## 2016-09-08 MED ORDER — ASPIRIN 81 MG PO TBEC: 81.0000 mg | DELAYED_RELEASE_TABLET | Freq: Every day | ORAL | 0 refills | Status: DC

## 2016-09-08 MED ORDER — FENTANYL CITRATE (PF) 100 MCG/2ML IJ SOLN
INTRAMUSCULAR | Status: AC | PRN
  Administered 2016-09-08: 50 ug via INTRAVENOUS

## 2016-09-08 MED ORDER — RENA-VITE PO TABS: 1.0000 | ORAL_TABLET | Freq: Every day | ORAL | Status: DC

## 2016-09-08 MED ORDER — LIDOCAINE VISCOUS 2 % MT SOLN
OROMUCOSAL | Status: AC
  Filled 2016-09-08: qty 15

## 2016-09-08 MED ORDER — MIDAZOLAM HCL 5 MG/5ML IJ SOLN
INTRAMUSCULAR | Status: AC
  Filled 2016-09-08: qty 5

## 2016-09-08 MED ORDER — FENTANYL CITRATE (PF) 100 MCG/2ML IJ SOLN
INTRAMUSCULAR | Status: AC
  Filled 2016-09-08: qty 2

## 2016-09-08 MED ORDER — ATORVASTATIN CALCIUM 40 MG PO TABS: 40.0000 mg | ORAL_TABLET | Freq: Every day | ORAL | 0 refills | Status: DC

## 2016-09-08 MED ORDER — MIDAZOLAM HCL 2 MG/2ML IJ SOLN
INTRAMUSCULAR | Status: AC | PRN
  Administered 2016-09-08: 2 mg via INTRAVENOUS
  Administered 2016-09-08 (×2): 1 mg via INTRAVENOUS

## 2016-09-08 NOTE — Progress Notes (Signed)
[place] at [place] was admitted to the [facility] on 09/07/2016 and Discharged  09/08/2016 and should be excused from work/school   for 5  days starting 09/07/2016 , may return to work/school without any restrictions.  Call Bettey Costa MD with questions.  Jacquez Sheetz M.D on 09/08/2016,at 12:14 PM  [place] [facility]  [phone]

## 2016-09-08 NOTE — Telephone Encounter (Signed)
New message  Pt needs a follow up appointment  Call [facility]

## 2016-09-08 NOTE — Progress Notes (Signed)
*  PRELIMINARY RESULTS* Echocardiogram 2D Echocardiogram has been performed.  Ruben Eaton 09/08/2016, 8:46 AM

## 2016-09-08 NOTE — Progress Notes (Signed)
MD order received to discharge pt home today; verbally reviewed AVS with pt, no questions voiced at this time; pt discharged via wheelchair by auxillary to the visitor's entrance

## 2016-09-08 NOTE — Discharge Summary (Signed)
[facility] [place] NAME: Ruben Eaton    MR#:  [id number]  DATE OF BIRTH:  April 07, 1971  DATE OF ADMISSION:  09/07/2016 ADMITTING PHYSICIAN: Saundra Shelling, MD  DATE OF DISCHARGE: 09/08/2016  PRIMARY CARE PHYSICIAN: Dion Body, MD    ADMISSION DIAGNOSIS:  Facial droop [R29.810] Cerebrovascular accident (CVA), unspecified mechanism ([place]) [I63.9]  DISCHARGE DIAGNOSIS:  Active Problems:     CVA (cerebral vascular accident) ([place])   SECONDARY DIAGNOSIS:   Past Medical History:  Diagnosis Date  . Hypertension     HOSPITAL COURSE:   45 year old male with history of hypertension who presented with right facial droop.  1. Acute right MCA branch infarct concern for embolic etiology: Patient underwent 2-D echocardiogram showed PFO versus ASD. Patient underwent TTE which shows PFO. He will follow up with Dr [name]  At [facility] to repair PFO. Patient was evaluated by neurology with recommendations to continue aspirin and Plavix. Patient will also need to be on statin medication. Carotid Doppler showed no evidence of hemodynamically significant stenosis. LDL is 111  2. Hypertension: Patient may resume outpatient medications DISCHARGE CONDITIONS AND DIET:   Stable for discharge on cardiac diet  CONSULTS OBTAINED:  Treatment Team:  Alexis Goodell, MD Nelva Bush, MD  DRUG ALLERGIES:  No Known Allergies  DISCHARGE MEDICATIONS:   Current Discharge Medication List    START taking these medications   Details  aspirin EC 81 MG EC tablet Take 1 tablet (81 mg total) by mouth daily. Qty: 120 tablet, Refills: 0    atorvastatin (LIPITOR) 40 MG tablet Take 1 tablet (40 mg total) by mouth daily at 6 PM. Qty: 30 tablet, Refills: 0    clopidogrel (PLAVIX) 75 MG tablet Take 1 tablet (75 mg total) by mouth daily. Qty: 30 tablet, Refills: 0      CONTINUE these medications which have NOT CHANGED   Details  b complex  vitamins capsule Take 1 capsule by mouth daily.     Diclofenac Sodium 2 % SOLN Apply 1 pump twice daily as needed. Qty: 112 g, Refills: 3    lisinopril-hydrochlorothiazide (PRINZIDE,ZESTORETIC) 20-12.5 MG tablet Take 0.5 tablets by mouth daily.     loratadine (CLARITIN) 10 MG tablet Take 10 mg by mouth daily.     Vitamin D, Ergocalciferol, (DRISDOL) 50000 units CAPS capsule Take 1 capsule (50,000 Units total) by mouth every 7 (seven) days. Qty: 12 capsule, Refills: 0    zolpidem (AMBIEN) 10 MG tablet 10 mg at bedtime.     nitroGLYCERIN (NITRO-DUR) 0.2 mg/hr patch Apply 1/4 patch onto the skin daily. Qty: 30 patch, Refills: 0      STOP taking these medications     ibuprofen (ADVIL,MOTRIN) 200 MG tablet      tadalafil (CIALIS) 5 MG tablet               Today   CHIEF COMPLAINT:  Patient underwent TEE today   VITAL SIGNS:  Blood pressure 122/80, pulse 64, temperature 97.7 F (36.5 C), temperature source Oral, resp. rate 13, height '5\' 6"'$  (1.676 m), weight 76.5 kg (168 lb 11.2 oz), SpO2 97 %.   REVIEW OF SYSTEMS:  Review of Systems  Constitutional: Negative.  Negative for chills, fever and malaise/fatigue.  HENT: Negative.  Negative for ear discharge, ear pain, hearing loss, nosebleeds and sore throat.   Eyes: Negative.  Negative for blurred vision and pain.  Respiratory: Negative.  Negative for cough, hemoptysis, shortness of breath and wheezing.  Cardiovascular: Negative.  Negative for chest pain, palpitations and leg swelling.  Gastrointestinal: Negative.  Negative for abdominal pain, blood in stool, diarrhea, nausea and vomiting.  Genitourinary: Negative.  Negative for dysuria.  Musculoskeletal: Negative.  Negative for back pain.  Skin: Negative.   Neurological: Negative for dizziness, tremors, speech change, focal weakness, seizures and headaches.       Facial droop  Endo/Heme/Allergies: Negative.  Does not bruise/bleed easily.  Psychiatric/Behavioral:  Negative.  Negative for depression, hallucinations and suicidal ideas.     PHYSICAL EXAMINATION:  GENERAL:  45 y.o.-year-old patient lying in the bed with no acute distress.  NECK:  Supple, no jugular venous distention. No thyroid enlargement, no tenderness.  LUNGS: Normal breath sounds bilaterally, no wheezing, rales,rhonchi  No use of accessory muscles of respiration.  CARDIOVASCULAR: S1, S2 normal. No murmurs, rubs, or gallops.  ABDOMEN: Soft, non-tender, non-distended. Bowel sounds present. No organomegaly or mass.  EXTREMITIES: No pedal edema, cyanosis, or clubbing.  PSYCHIATRIC: The patient is alert and oriented x 3.  SKIN: No obvious rash, lesion, or ulcer.  NEURO facial droop no other neurological defecits DATA REVIEW:   CBC  Recent Labs Lab 09/07/16 0219  WBC 8.8  HGB 15.1  HCT 42.5  PLT 270    Chemistries   Recent Labs Lab 09/07/16 0307  NA 133*  K 4.1  CL 99*  CO2 26  GLUCOSE 143*  BUN 15  CREATININE 1.03  CALCIUM 9.5    Cardiac Enzymes  Recent Labs Lab 09/07/16 0307  TROPONINI <0.03    Microbiology Results  '@[id number]'$ @  RADIOLOGY:  Ct Head Wo Contrast  Result Date: 09/07/2016 CLINICAL DATA:  45 y/o  M; left-sided neurologic symptoms. EXAM: CT HEAD WITHOUT CONTRAST TECHNIQUE: Contiguous axial images were obtained from the base of the skull through the vertex without intravenous contrast. COMPARISON:  None. FINDINGS: Brain: Lucency within the right insula with loss of gray-white differentiation (series 3, image 14). No other large territory infarct, focal mass effect, or intracranial hemorrhage is identified. No hydrocephalus. Vascular: No hyperdense vessel or unexpected calcification. Skull: Normal. Negative for fracture or focal lesion. Sinuses/Orbits: No acute finding. Other: None. IMPRESSION: Lucency within the right insula may represent acute/subacute infarction. Aspects is 9. These results were called by telephone at the time of  interpretation on 09/07/2016 at 2:58 am to Dr. Charlesetta Ivory , who verbally acknowledged these results. Electronically Signed   By: Kristine Garbe M.D.   On: 09/07/2016 02:58   Mr Brain Wo Contrast  Result Date: 09/07/2016 CLINICAL DATA:  45 year old male with right facial droop, drooling, and altered mental status. Evidence of cytotoxic edema at the right insula on earlier head CT. Initial encounter. EXAM: MRI HEAD WITHOUT CONTRAST MRA HEAD WITHOUT CONTRAST TECHNIQUE: Multiplanar, multiecho pulse sequences of the brain and surrounding structures were obtained without intravenous contrast. Angiographic images of the head were obtained using MRA technique without contrast. COMPARISON:  Head CT without contrast 0228 hours today. FINDINGS: MRI HEAD FINDINGS Brain: 2 cm area of confluent restricted diffusion involving the mid right insula, and adjacent right frontal operculum (series 8, image 21). No basal ganglia involvement. Associated T2 and FLAIR hyperintensity. Mild insular swelling but no regional mass effect. No associated hemorrhage. No contralateral or posterior fossa restricted diffusion. Nomidline shift, mass effect, evidence of mass lesion, ventriculomegaly, extra-axial collection or acute intracranial hemorrhage. Cervicomedullary junction and pituitary are within normal limits. Outside of the acute findings above supratentorial gray and white matter signal is within normal limits.  However, there are small chronic lacunar infarcts in both cerebellar hemispheres (series 13, image 10). The brainstem is normal. No chronic cerebral blood products identified. Vascular: Major intracranial vascular flow voids are preserved. Skull and upper cervical spine: Negative. Normal bone marrow signal. Sinuses/Orbits: Right maxillary sinus mucous retention cyst. Postoperative changes to the right globe. Otherwise negative orbit soft tissues. Other: Visible internal auditory structures appear normal. Mastoids  are clear. Negative scalp soft tissues. MRA HEAD FINDINGS Antegrade flow in the posterior circulation with codominant distal vertebral arteries. Normal PICA origins, vertebrobasilar junction, basilar artery, SCA and PCA origins. Posterior communicating arteries are diminutive or absent. Normal bilateral PCA branches. Antegrade flow in both ICA siphons. No siphon irregularity or stenosis. Normal ophthalmic artery origins. Normal carotid termini. Normal MCA and ACA origins. Diminutive or absent anterior communicating artery. Visualized ACA branches are within normal limits. Left MCA M1 segment, bifurcation, and visualized branches are within normal limits. Right MCA M1 segment is normal. Right MCA bifurcation appears normal. However, there is moderate to severe irregularity of a right MCA middle sylvian distal M2 or M3 branch best seen on series 19, image 14 corresponding to series 9, image 70 of the source images. This vessel remains patent. No major branch occlusion is identified. IMPRESSION: 1. Small acute infarct affecting the middle right MCA territory. Right insula and frontal operculum affected. Cytotoxic edema without hemorrhage or mass effect. 2. Associated moderate to severe irregularity and stenosis of a right MCA middle sylvian division branch, distal M2 or M3. Negative for emergent large vessel occlusion. 3. 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 Considering the patient's age an multiple vascular territory ischemia recommend evaluation for PFO or other source of paradoxical emboli. Electronically Signed   By: Genevie Ann M.D.   On: 09/07/2016 09:26   US Carotid Bilateral (at [facility] Only)  Result Date: 09/07/2016 CLINICAL DATA:  TIA SYMPTOMS, HYPERTENSION EXAM: BILATERAL CAROTID DUPLEX ULTRASOUND TECHNIQUE: Pearline Cables scale imaging, color Doppler and duplex ultrasound were performed of bilateral carotid and vertebral arteries in the neck. COMPARISON:  09/07/2016 brain MRI FINDINGS:  Criteria: Quantification of carotid stenosis is based on velocity parameters that correlate the residual internal carotid diameter with NASCET-based stenosis levels, using the diameter of the distal internal carotid lumen as the denominator for stenosis measurement. The following velocity measurements were obtained: RIGHT ICA:  125/26 cm/sec CCA:  485/46 cm/sec SYSTOLIC ICA/CCA RATIO:  1.2 DIASTOLIC ICA/CCA RATIO:  1.0 ECA:  91 cm/sec LEFT ICA:  74/23 cm/sec CCA:  27/03 cm/sec SYSTOLIC ICA/CCA RATIO:  0.8 DIASTOLIC ICA/CCA RATIO:  0.9 ECA:  93 cm/sec RIGHT CAROTID ARTERY: No significant atherosclerotic plaque formation. No hemodynamically significant right ICA stenosis, velocity elevation, or turbulent flow. Negative for stenosis. RIGHT VERTEBRAL ARTERY:  Antegrade LEFT CAROTID ARTERY: Very minor bifurcation atherosclerotic plaque formation. No hemodynamically significant left ICA stenosis, velocity elevation, or turbulent flow. Degree of stenosis less than 50%. LEFT VERTEBRAL ARTERY:  Antegrade IMPRESSION: Minor left carotid atherosclerosis. No hemodynamically significant ICA stenosis. Patent antegrade vertebral flow bilaterally Electronically Signed   By: Jerilynn Mages.  Shick M.D.   On: 09/07/2016 09:47   US Venous Img Lower Bilateral  Result Date: 09/08/2016 CLINICAL DATA:  Bilateral lower extremity pain. EXAM: BILATERAL LOWER EXTREMITY VENOUS DOPPLER ULTRASOUND TECHNIQUE: Gray-scale sonography with graded compression, as well as color Doppler and duplex ultrasound were performed to evaluate the lower extremity deep venous systems from the level of the common femoral vein and including the common femoral, femoral, profunda femoral,  popliteal and calf veins including the posterior tibial, peroneal and gastrocnemius veins when visible. The superficial great saphenous vein was also interrogated. Spectral Doppler was utilized to evaluate flow at rest and with distal augmentation maneuvers in the common femoral, femoral and  popliteal veins. COMPARISON:  None. FINDINGS: RIGHT LOWER EXTREMITY Common Femoral Vein: No evidence of thrombus. Normal compressibility, respiratory phasicity and response to augmentation. Saphenofemoral Junction: No evidence of thrombus. Normal compressibility and flow on color Doppler imaging. Profunda Femoral Vein: No evidence of thrombus. Normal compressibility and flow on color Doppler imaging. Femoral Vein: No evidence of thrombus. Normal compressibility, respiratory phasicity and response to augmentation. Popliteal Vein: No evidence of thrombus. Normal compressibility, respiratory phasicity and response to augmentation. Calf Veins: No evidence of thrombus. Normal compressibility and flow on color Doppler imaging. Superficial Great Saphenous Vein: No evidence of thrombus. Normal compressibility and flow on color Doppler imaging. Venous Reflux:  None. Other Findings: No evidence of superficial thrombophlebitis or abnormal fluid collection. LEFT LOWER EXTREMITY Common Femoral Vein: No evidence of thrombus. Normal compressibility, respiratory phasicity and response to augmentation. Saphenofemoral Junction: No evidence of thrombus. Normal compressibility and flow on color Doppler imaging. Profunda Femoral Vein: No evidence of thrombus. Normal compressibility and flow on color Doppler imaging. Femoral Vein: No evidence of thrombus. Normal compressibility, respiratory phasicity and response to augmentation. Popliteal Vein: No evidence of thrombus. Normal compressibility, respiratory phasicity and response to augmentation. Calf Veins: No evidence of thrombus. Normal compressibility and flow on color Doppler imaging. Superficial Great Saphenous Vein: No evidence of thrombus. Normal compressibility and flow on color Doppler imaging. Venous Reflux:  None. Other Findings: No evidence of superficial thrombophlebitis or abnormal fluid collection. IMPRESSION: No evidence of bilateral lower extremity deep venous thrombosis.  Electronically Signed   By: Aletta Edouard M.D.   On: 09/08/2016 10:16   Mr Jodene Nam Head/brain UX Cm  Result Date: 09/07/2016 CLINICAL DATA:  45 year old male with right facial droop, drooling, and altered mental status. Evidence of cytotoxic edema at the right insula on earlier head CT. Initial encounter. EXAM: MRI HEAD WITHOUT CONTRAST MRA HEAD WITHOUT CONTRAST TECHNIQUE: Multiplanar, multiecho pulse sequences of the brain and surrounding structures were obtained without intravenous contrast. Angiographic images of the head were obtained using MRA technique without contrast. COMPARISON:  Head CT without contrast 0228 hours today. FINDINGS: MRI HEAD FINDINGS Brain: 2 cm area of confluent restricted diffusion involving the mid right insula, and adjacent right frontal operculum (series 8, image 21). No basal ganglia involvement. Associated T2 and FLAIR hyperintensity. Mild insular swelling but no regional mass effect. No associated hemorrhage. No contralateral or posterior fossa restricted diffusion. Nomidline shift, mass effect, evidence of mass lesion, ventriculomegaly, extra-axial collection or acute intracranial hemorrhage. Cervicomedullary junction and pituitary are within normal limits. Outside of the acute findings above supratentorial gray and white matter signal is within normal limits. However, there are small chronic lacunar infarcts in both cerebellar hemispheres (series 13, image 10). The brainstem is normal. No chronic cerebral blood products identified. Vascular: Major intracranial vascular flow voids are preserved. Skull and upper cervical spine: Negative. Normal bone marrow signal. Sinuses/Orbits: Right maxillary sinus mucous retention cyst. Postoperative changes to the right globe. Otherwise negative orbit soft tissues. Other: Visible internal auditory structures appear normal. Mastoids are clear. Negative scalp soft tissues. MRA HEAD FINDINGS Antegrade flow in the posterior circulation with  codominant distal vertebral arteries. Normal PICA origins, vertebrobasilar junction, basilar artery, SCA and PCA origins. Posterior communicating arteries are diminutive or absent. Normal  bilateral PCA branches. Antegrade flow in both ICA siphons. No siphon irregularity or stenosis. Normal ophthalmic artery origins. Normal carotid termini. Normal MCA and ACA origins. Diminutive or absent anterior communicating artery. Visualized ACA branches are within normal limits. Left MCA M1 segment, bifurcation, and visualized branches are within normal limits. Right MCA M1 segment is normal. Right MCA bifurcation appears normal. However, there is moderate to severe irregularity of a right MCA middle sylvian distal M2 or M3 branch best seen on series 19, image 14 corresponding to series 9, image 70 of the source images. This vessel remains patent. No major branch occlusion is identified. IMPRESSION: 1. Small acute infarct affecting the middle right MCA territory. Right insula and frontal operculum affected. Cytotoxic edema without hemorrhage or mass effect. 2. Associated moderate to severe irregularity and stenosis of a right MCA middle sylvian division branch, distal M2 or M3. Negative for emergent large vessel occlusion. 3. 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 Considering the patient's age an multiple vascular territory ischemia recommend evaluation for PFO or other source of paradoxical emboli. Electronically Signed   By: Genevie Ann M.D.   On: 09/07/2016 09:26      Management plans discussed with the patient and he is in agreement. Stable for discharge home  Patient should follow up with pcp  CODE STATUS:     Code Status Orders        Start     Ordered   09/07/16 0534  Full code  Continuous     09/07/16 0533    Code Status History    Date Active Date Inactive Code Status Order ID Comments User Context   This patient has a current code status but no historical code status.       TOTAL TIME TAKING CARE OF THIS PATIENT: 38 minutes.    Note: This dictation was prepared with Dragon dictation along with smaller phrase technology. Any transcriptional errors that result from this process are unintentional.  Marlow Hendrie M.D on 09/08/2016 at 10:53 AM  Between 7am to 6pm - Pager - [phone] After 6pm go to [email or web address] - password [email or web address] [facility]  Office  [phone]  CC: Primary care physician; Dion Body, MD

## 2016-09-08 NOTE — Progress Notes (Signed)
   Discussed TEE with patient. Advised he will be seeing Dr. [name] in follow up. Message sent to our office to schedule that visit. Patient had not further questions or concerns.

## 2016-09-09 LAB — CARDIOLIPIN ANTIBODIES, IGG, IGM, IGA: Anticardiolipin IgA: <9 APL U/mL (ref 0–11)

## 2016-09-09 LAB — PROTEIN C, TOTAL: PROTEIN C, TOTAL: 76 % (ref 60–150)

## 2016-09-09 NOTE — Telephone Encounter (Signed)
I spoke with the pt and scheduled him for consult with Dr [name] on 09/15/16.  TEE showed PFO while hospitalized at [facility] for CVA.

## 2016-09-12 ENCOUNTER — Encounter: Payer: Self-pay | Admitting: Cardiovascular Disease

## 2016-09-12 LAB — FACTOR 5 LEIDEN

## 2016-09-14 DIAGNOSIS — Z8673 Personal history of transient ischemic attack (TIA), and cerebral infarction without residual deficits: Secondary | ICD-10-CM | POA: Diagnosis not present

## 2016-09-15 ENCOUNTER — Ambulatory Visit (INDEPENDENT_AMBULATORY_CARE_PROVIDER_SITE_OTHER): Payer: 59 | Admitting: Cardiovascular Disease

## 2016-09-15 ENCOUNTER — Encounter: Payer: Self-pay | Admitting: Cardiovascular Disease

## 2016-09-15 VITALS — BP 140/60 | HR 72 | Ht 66.0 in | Wt 165.1 lb

## 2016-09-15 DIAGNOSIS — Q211 Atrial septal defect: Secondary | ICD-10-CM

## 2016-09-15 DIAGNOSIS — Q2112 Patent foramen ovale: Secondary | ICD-10-CM

## 2016-09-15 NOTE — Progress Notes (Signed)
Cardiology Office Note Date:  09/15/2016   ID:  Ruben Eaton, DOB 06/09/1971, MRN [id number]  PCP:  Dion Body, MD  Cardiologist:  [name], MD    Chief Complaint  Patient presents with  . PFO    consult   History of Present Illness: Ruben Eaton is a 45 y.o. male who presents for evaluation of PFO closure.   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 Also reports inability to feel hot and cold sensation on the left arm and hand. Had expressive aphasia but all of his symptoms are improved. He feels like he has to concentrate more than normal in order to speak.   MRI showed an acute right MCA branch infarct concerning for embolism and also areas of chronic cerebellar infarction. He had a positive echo bubble study on 12-13 then had a TEE the following day. This confirmed the presence of PFO with positive microcavitation study.   He's physically active with running and weight lifting. He has no exertional symptoms. Today, he denies symptoms of palpitations, chest pain, shortness of breath, orthopnea, PND, lower extremity edema, dizziness, or syncope.   Past Medical History:  Diagnosis Date  . Hypertension     Past Surgical History:  Procedure Laterality Date  . TEE WITHOUT CARDIOVERSION N/A 09/08/2016   Procedure: TRANSESOPHAGEAL ECHOCARDIOGRAM (TEE);  Surgeon: [name], MD;  Location: [facility] ORS;  Service: Cardiovascular;  Laterality: N/A;    Current Outpatient Prescriptions  Medication Sig Dispense Refill  . aspirin EC 81 MG EC tablet Take 1 tablet (81 mg total) by mouth daily. 120 tablet 0  . atorvastatin (LIPITOR) 40 MG tablet Take 1 tablet (40 mg total) by mouth daily at 6 PM. 30 tablet 0  . b complex vitamins capsule Take 1 capsule by mouth daily.     . clopidogrel (PLAVIX) 75 MG tablet Take 1 tablet (75 mg total) by mouth daily. 30 tablet 0  . Diclofenac Sodium 2 % SOLN  Apply 1 pump twice daily as needed. 112 g 3  . lisinopril-hydrochlorothiazide (PRINZIDE,ZESTORETIC) 20-12.5 MG tablet Take 0.5 tablets by mouth daily.     Marland Kitchen loratadine (CLARITIN) 10 MG tablet Take 10 mg by mouth daily.     . nitroGLYCERIN (NITRODUR - DOSED IN MG/24 HR) 0.2 mg/hr patch Place 0.2 mg onto the skin daily as needed (pain).    . Vitamin D, Ergocalciferol, (DRISDOL) 50000 units CAPS capsule Take 1 capsule (50,000 Units total) by mouth every 7 (seven) days. 12 capsule 0  . zolpidem (AMBIEN) 10 MG tablet Take 10 mg by mouth at bedtime as needed for sleep.      No current facility-administered medications for this visit.     Allergies:   Patient has no known allergies.   Social History:  The patient  reports that he has never smoked. He has never used smokeless tobacco. He reports that he does not drink alcohol.   Family History:  The patient's family history is negative for stroke or MI/CAD in any first degree relatives. His grandmother had a stroke in her mid-70's.   ROS:  Please see the history of present illness.  All other systems are reviewed and negative.   PHYSICAL EXAM: VS:  BP 140/60   Pulse 72   Ht '5\' 6"'$  (1.676 m)   Wt 165 lb 1.9 oz (74.9 kg)   BMI 26.65 kg/m  , BMI  Body mass index is 26.65 kg/m. GEN: Well nourished, well developed, in no acute distress  HEENT: normal  Neck: no JVD, no masses. No carotid bruits Cardiac: RRR without murmur or gallop                Respiratory:  clear to auscultation bilaterally, normal work of breathing GI: soft, nontender, nondistended, + BS MS: no deformity or atrophy  Ext: no pretibial edema, pedal pulses 2+= bilaterally Skin: warm and dry, no rash Neuro:  Strength and sensation are intact Psych: euthymic mood, full affect  EKG:  EKG is not ordered today.  Recent Labs: 09/07/2016: BUN 15; Creatinine, Ser 1.03; Hemoglobin 15.1; Platelets 270; Potassium 4.1; Sodium 133   Lipid Panel     Component Value Date/Time   CHOL  228 (H) 09/07/2016 0307   TRIG 44 09/07/2016 0307   HDL 108 09/07/2016 0307   CHOLHDL 2.1 09/07/2016 0307   VLDL 9 09/07/2016 0307   LDLCALC 111 (H) 09/07/2016 0307      Wt Readings from Last 3 Encounters:  09/15/16 165 lb 1.9 oz (74.9 kg)  09/07/16 168 lb 11.2 oz (76.5 kg)  06/06/16 168 lb (76.2 kg)     Cardiac Studies Reviewed: TEE: Study Conclusions  - Left ventricle: The cavity size was normal. Wall thickness was   normal. Systolic function was normal. The estimated ejection   fraction was in the range of 55% to 65%. Wall motion was normal;   there were no regional wall motion abnormalities. - Left atrium: No evidence of thrombus in the atrial cavity or   appendage. - Right atrium: No evidence of thrombus in the atrial cavity or   appendage. No evidence of thrombus in the atrial cavity or   appendage. - Atrial septum: There was a small tunneled patent foramen ovale.   Echo contrast study showed a small right-to-left atrial level   shunt, at baseline which worsened with cough.  Impressions:  - Normal study other than a small PFO.  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 The patient has MRI evidence of a recent stroke and also raises the possibility of old cerebellar strokes. He is tolerating the combination of aspirin and Plavix. We discussed the association between PFO and stroke, especially in a younger patient group. We reviewed recent randomized controlled trial data demonstrating superiority of PFO closure over medical therapy with respect to secondary stroke reduction in patients with cryptogenic stroke.   I reviewed the risks, indications, and alternatives to transcatheter closure with the patient. Specific risks include bleeding, infection, device embolization, stroke, cardiac perforation, tamponade, arrhythmia, MI, and late device erosion. He understands these serious risks occur at low incidence of < 1%. Atrial  fibrillation has been reported in up to 3-5% of patients undergoing PFO closure.   I will touch base with Dr Doy Mince to make sure she is in agreement that we pursue PFO closure in this patient.   Current medicines are reviewed with the patient today.  The patient does not have concerns regarding medicines.  Labs/ tests ordered today include:   Orders Placed This Encounter  Procedures  . CBC  . INR/PT  . Basic Metabolic Panel (BMET)  . ECHOCARDIOGRAM LIMITED   Signed, [name], MD  09/15/2016 5:46 PM    [facility] [place], [place], [place]  [address] Phone: [phone]; Fax: [phone]

## 2016-09-15 NOTE — Patient Instructions (Addendum)
Medication Instructions:  Your physician recommends that you continue on your current medications as directed. Please refer to the Current Medication list given to you today.   Labwork: Your physician recommends that you return for lab work in: Tuesday January 9th.    Testing/Procedures: Your physician has requested that you have a cardiac catheterization for PFO closure. Cardiac catheterization is used to diagnose and/or treat various heart conditions. Doctors may recommend this procedure for a number of different reasons. The most common reason is to evaluate chest pain. Chest pain can be a symptom of coronary artery disease (CAD), and cardiac catheterization can show whether plaque is narrowing or blocking your heart's arteries. This procedure is also used to evaluate the valves, as well as measure the blood flow and oxygen levels in different parts of your heart. For further information please visit [email or web address]. Please follow instruction sheet, as given.  Your physician has requested that you have an echocardiogram 1 month after your procedure. Echocardiography is a painless test that uses sound waves to create images of your heart. It provides your doctor with information about the size and shape of your heart and how well your heart's chambers and valves are working. This procedure takes approximately one hour. There are no restrictions for this procedure.      Follow-Up: Your physician recommends that you schedule a follow-up appointment in: 1 month after procedure on 10/07/16 with Dr. [name].   If you need a refill on your cardiac medications before your next appointment, please call your pharmacy.

## 2016-09-30 ENCOUNTER — Encounter: Payer: Self-pay | Admitting: Cardiovascular Disease

## 2016-10-04 ENCOUNTER — Other Ambulatory Visit: Payer: Self-pay

## 2016-10-05 ENCOUNTER — Other Ambulatory Visit: Payer: 59 | Admitting: *Deleted

## 2016-10-05 DIAGNOSIS — I6300 Cerebral infarction due to thrombosis of unspecified precerebral artery: Secondary | ICD-10-CM

## 2016-10-05 DIAGNOSIS — I10 Essential (primary) hypertension: Secondary | ICD-10-CM

## 2016-10-05 DIAGNOSIS — G450 Vertebro-basilar artery syndrome: Secondary | ICD-10-CM | POA: Diagnosis not present

## 2016-10-06 LAB — CBC WITH DIFFERENTIAL/PLATELET
BASOS ABS: 0 10*3/uL (ref 0.0–0.2)
Basos: 0 %
EOS (ABSOLUTE): 0.2 10*3/uL (ref 0.0–0.4)
Eos: 6 %
Hematocrit: 39.1 % (ref 37.5–51.0)
Hemoglobin: 13.2 g/dL (ref 13.0–17.7)
IMMATURE GRANS (ABS): 0 10*3/uL (ref 0.0–0.1)
Immature Granulocytes: 0 %
LYMPHS: 43 %
Lymphocytes Absolute: 1.9 10*3/uL (ref 0.7–3.1)
MCH: 31.2 pg (ref 26.6–33.0)
MCHC: 33.8 g/dL (ref 31.5–35.7)
MCV: 92 fL (ref 79–97)
Monocytes Absolute: 0.3 10*3/uL (ref 0.1–0.9)
Monocytes: 7 %
NEUTROS PCT: 44 %
Neutrophils Absolute: 1.9 10*3/uL (ref 1.4–7.0)
PLATELETS: 250 10*3/uL (ref 150–379)
RBC: 4.23 x10E6/uL (ref 4.14–5.80)
RDW: 13.1 % (ref 12.3–15.4)
WBC: 4.4 10*3/uL (ref 3.4–10.8)

## 2016-10-06 LAB — BASIC METABOLIC PANEL
BUN/Creatinine Ratio: 16 (ref 9–20)
BUN: 17 mg/dL (ref 6–24)
CHLORIDE: 100 mmol/L (ref 96–106)
CO2: 24 mmol/L (ref 18–29)
Calcium: 9.8 mg/dL (ref 8.7–10.2)
Creatinine, Ser: 1.06 mg/dL (ref 0.76–1.27)
GFR calc Af Amer: 97 mL/min/{1.73_m2} (ref >59)
GFR calc non Af Amer: 84 mL/min/{1.73_m2} (ref >59)
GLUCOSE: 99 mg/dL (ref 65–99)
POTASSIUM: 4.2 mmol/L (ref 3.5–5.2)
Sodium: 141 mmol/L (ref 134–144)

## 2016-10-06 LAB — PROTIME-INR
INR: 1 (ref 0.8–1.2)
Prothrombin Time: 10.4 s (ref 9.1–12.0)

## 2016-10-07 ENCOUNTER — Ambulatory Visit (HOSPITAL_COMMUNITY)
Admission: RE | Admit: 2016-10-07 | Discharge: 2016-10-08 | Disposition: A | Discharge status: Home / Self Care | Payer: 59 | Source: Ambulatory Visit | Attending: Cardiovascular Disease | Admitting: Cardiovascular Disease

## 2016-10-07 ENCOUNTER — Encounter (HOSPITAL_COMMUNITY)
Admission: RE | Disposition: A | Discharge status: Home / Self Care | Payer: Self-pay | Source: Ambulatory Visit | Attending: Cardiovascular Disease

## 2016-10-07 ENCOUNTER — Ambulatory Visit (HOSPITAL_BASED_OUTPATIENT_CLINIC_OR_DEPARTMENT_OTHER): Payer: 59

## 2016-10-07 ENCOUNTER — Encounter (HOSPITAL_COMMUNITY): Payer: Self-pay | Admitting: Cardiovascular Disease

## 2016-10-07 DIAGNOSIS — Q2112 Patent foramen ovale: Secondary | ICD-10-CM

## 2016-10-07 DIAGNOSIS — R29810 Facial weakness: Secondary | ICD-10-CM | POA: Diagnosis not present

## 2016-10-07 DIAGNOSIS — Z8673 Personal history of transient ischemic attack (TIA), and cerebral infarction without residual deficits: Secondary | ICD-10-CM | POA: Insufficient documentation

## 2016-10-07 DIAGNOSIS — Z823 Family history of stroke: Secondary | ICD-10-CM | POA: Diagnosis not present

## 2016-10-07 DIAGNOSIS — I10 Essential (primary) hypertension: Secondary | ICD-10-CM | POA: Diagnosis not present

## 2016-10-07 DIAGNOSIS — Q211 Atrial septal defect: Secondary | ICD-10-CM | POA: Insufficient documentation

## 2016-10-07 DIAGNOSIS — I493 Ventricular premature depolarization: Secondary | ICD-10-CM | POA: Insufficient documentation

## 2016-10-07 DIAGNOSIS — Z7902 Long term (current) use of antithrombotics/antiplatelets: Secondary | ICD-10-CM | POA: Insufficient documentation

## 2016-10-07 DIAGNOSIS — Z7982 Long term (current) use of aspirin: Secondary | ICD-10-CM | POA: Diagnosis not present

## 2016-10-07 DIAGNOSIS — I369 Nonrheumatic tricuspid valve disorder, unspecified: Secondary | ICD-10-CM

## 2016-10-07 HISTORY — DX: Atrial septal defect: Q21.1

## 2016-10-07 HISTORY — DX: Obstructive sleep apnea (adult) (pediatric): G47.33

## 2016-10-07 HISTORY — DX: Cerebral infarction, unspecified: I63.9

## 2016-10-07 HISTORY — DX: Patent foramen ovale: Q21.12

## 2016-10-07 HISTORY — PX: PATENT FORAMEN OVALE CLOSURE: SHX2181

## 2016-10-07 HISTORY — DX: Dependence on other enabling machines and devices: Z99.89

## 2016-10-07 LAB — ECHOCARDIOGRAM LIMITED
E decel time: 296 msec
FS: 33 % (ref 28–44)
HEIGHTINCHES: 66 in
IV/PV OW: 0.78
LA diam end sys: 28 mm
LA vol A4C: 50.8 ml
LA vol index: 27.1 mL/m2
LA vol: 49.8 mL
LADIAMINDEX: 1.52 cm/m2
LASIZE: 28 mm
LVOT area: 4.52 cm2
LVOTD: 24 mm
MV Dec: 296
MV pk A vel: 71.3 m/s
MV pk E vel: 67.9 m/s
PW: 13.7 mm — AB (ref 0.6–1.1)
WEIGHTICAEL: 2640 [oz_av]

## 2016-10-07 LAB — POCT ACTIVATED CLOTTING TIME
ACTIVATED CLOTTING TIME: 224 s
Activated Clotting Time: 235 seconds
Activated Clotting Time: 175 seconds

## 2016-10-07 SURGERY — PATENT FORAMEN OVALE (PFO) CLOSURE
Anesthesia: LOCAL

## 2016-10-07 MED ORDER — SODIUM CHLORIDE 0.9% FLUSH
3.0000 mL | Freq: Two times a day (BID) | INTRAVENOUS | Status: DC
  Administered 2016-10-07 – 2016-10-08 (×3): 3 mL via INTRAVENOUS

## 2016-10-07 MED ORDER — SODIUM CHLORIDE 0.9% FLUSH
3.0000 mL | INTRAVENOUS | Status: DC | PRN
Start: 2016-10-07 — End: 2016-10-07

## 2016-10-07 MED ORDER — SODIUM CHLORIDE 0.9 % WEIGHT BASED INFUSION: 1.0000 mL/kg/h | INTRAVENOUS | Status: DC

## 2016-10-07 MED ORDER — SODIUM CHLORIDE 0.9 % WEIGHT BASED INFUSION: 1.0000 mL/kg/h | INTRAVENOUS | Status: AC

## 2016-10-07 MED ORDER — FENTANYL CITRATE (PF) 100 MCG/2ML IJ SOLN
INTRAMUSCULAR | Status: AC
  Filled 2016-10-07: qty 2

## 2016-10-07 MED ORDER — ONDANSETRON HCL 4 MG/2ML IJ SOLN: 4.0000 mg | Freq: Four times a day (QID) | INTRAMUSCULAR | Status: DC | PRN

## 2016-10-07 MED ORDER — MIDAZOLAM HCL 2 MG/2ML IJ SOLN
INTRAMUSCULAR | Status: AC
  Filled 2016-10-07: qty 2

## 2016-10-07 MED ORDER — FENTANYL CITRATE (PF) 100 MCG/2ML IJ SOLN
INTRAMUSCULAR | Status: DC | PRN
  Administered 2016-10-07: 50 ug via INTRAVENOUS
  Administered 2016-10-07: 25 ug via INTRAVENOUS
  Administered 2016-10-07: 50 ug via INTRAVENOUS

## 2016-10-07 MED ORDER — LIDOCAINE HCL (PF) 1 % IJ SOLN
INTRAMUSCULAR | Status: DC | PRN
  Administered 2016-10-07: 18 mL

## 2016-10-07 MED ORDER — LISINOPRIL 10 MG PO TABS
10.0000 mg | ORAL_TABLET | Freq: Every day | ORAL | Status: DC
  Administered 2016-10-08: 10 mg via ORAL
  Filled 2016-10-07: qty 1

## 2016-10-07 MED ORDER — ATORVASTATIN CALCIUM 40 MG PO TABS
40.0000 mg | ORAL_TABLET | Freq: Every day | ORAL | Status: DC
  Administered 2016-10-07: 18:00:00 40 mg via ORAL
  Filled 2016-10-07: qty 1

## 2016-10-07 MED ORDER — SODIUM CHLORIDE 0.9 % IV SOLN: 250.0000 mL | INTRAVENOUS | Status: DC | PRN

## 2016-10-07 MED ORDER — ASPIRIN 81 MG PO CHEW: 81.0000 mg | CHEWABLE_TABLET | ORAL | Status: DC

## 2016-10-07 MED ORDER — OXYCODONE-ACETAMINOPHEN 5-325 MG PO TABS
1.0000 | ORAL_TABLET | ORAL | Status: DC | PRN
  Administered 2016-10-07 (×3): 1 via ORAL
  Filled 2016-10-07 (×3): qty 1

## 2016-10-07 MED ORDER — ACETAMINOPHEN 325 MG PO TABS: 650.0000 mg | ORAL_TABLET | ORAL | Status: DC | PRN

## 2016-10-07 MED ORDER — CLOPIDOGREL BISULFATE 75 MG PO TABS: 75.0000 mg | ORAL_TABLET | ORAL | Status: DC

## 2016-10-07 MED ORDER — SODIUM CHLORIDE 0.9 % WEIGHT BASED INFUSION
3.0000 mL/kg/h | INTRAVENOUS | Status: DC
  Administered 2016-10-07: 3 mL/kg/h via INTRAVENOUS
  Administered 2016-10-07: 200 mL/h via INTRAVENOUS

## 2016-10-07 MED ORDER — ZOLPIDEM TARTRATE 5 MG PO TABS: 10.0000 mg | ORAL_TABLET | Freq: Every evening | ORAL | Status: DC | PRN

## 2016-10-07 MED ORDER — HEPARIN (PORCINE) IN NACL 2-0.9 UNIT/ML-% IJ SOLN
INTRAMUSCULAR | Status: DC | PRN
  Administered 2016-10-07: 500 mL

## 2016-10-07 MED ORDER — ASPIRIN EC 81 MG PO TBEC
81.0000 mg | DELAYED_RELEASE_TABLET | Freq: Every day | ORAL | Status: DC
  Administered 2016-10-08: 81 mg via ORAL
  Filled 2016-10-07: qty 1

## 2016-10-07 MED ORDER — LISINOPRIL-HYDROCHLOROTHIAZIDE 20-12.5 MG PO TABS: 0.5000 | ORAL_TABLET | Freq: Every day | ORAL | Status: DC

## 2016-10-07 MED ORDER — ASPIRIN 81 MG PO TBEC: 81.0000 mg | DELAYED_RELEASE_TABLET | Freq: Every day | ORAL | Status: DC

## 2016-10-07 MED ORDER — CLOPIDOGREL BISULFATE 75 MG PO TABS
75.0000 mg | ORAL_TABLET | Freq: Every day | ORAL | Status: DC
  Administered 2016-10-08: 75 mg via ORAL
  Filled 2016-10-07: qty 1

## 2016-10-07 MED ORDER — HEPARIN (PORCINE) IN NACL 2-0.9 UNIT/ML-% IJ SOLN
INTRAMUSCULAR | Status: AC
  Filled 2016-10-07: qty 500

## 2016-10-07 MED ORDER — SODIUM CHLORIDE 0.9% FLUSH
3.0000 mL | Freq: Two times a day (BID) | INTRAVENOUS | Status: DC
  Administered 2016-10-07: 3 mL via INTRAVENOUS

## 2016-10-07 MED ORDER — SODIUM CHLORIDE 0.9% FLUSH: 3.0000 mL | INTRAVENOUS | Status: DC | PRN

## 2016-10-07 MED ORDER — LORATADINE 10 MG PO TABS
10.0000 mg | ORAL_TABLET | Freq: Every day | ORAL | Status: DC
  Administered 2016-10-08: 10 mg via ORAL
  Filled 2016-10-07: qty 1

## 2016-10-07 MED ORDER — MIDAZOLAM HCL 2 MG/2ML IJ SOLN
INTRAMUSCULAR | Status: DC | PRN
  Administered 2016-10-07 (×3): 2 mg via INTRAVENOUS

## 2016-10-07 MED ORDER — HEPARIN SODIUM (PORCINE) 1000 UNIT/ML IJ SOLN
INTRAMUSCULAR | Status: DC | PRN
  Administered 2016-10-07: 2000 [IU] via INTRAVENOUS
  Administered 2016-10-07: 6000 [IU] via INTRAVENOUS

## 2016-10-07 MED ORDER — DIAZEPAM 5 MG PO TABS
5.0000 mg | ORAL_TABLET | Freq: Four times a day (QID) | ORAL | Status: DC | PRN
  Administered 2016-10-07: 5 mg via ORAL
  Filled 2016-10-07: qty 1

## 2016-10-07 MED ORDER — TRIAMCINOLONE ACETONIDE 55 MCG/ACT NA AERO: 2.0000 | INHALATION_SPRAY | Freq: Every day | NASAL | Status: DC | PRN

## 2016-10-07 MED ORDER — LIDOCAINE HCL (PF) 1 % IJ SOLN
INTRAMUSCULAR | Status: AC
  Filled 2016-10-07: qty 30

## 2016-10-07 MED ORDER — HEPARIN SODIUM (PORCINE) 1000 UNIT/ML IJ SOLN
INTRAMUSCULAR | Status: AC
  Filled 2016-10-07: qty 1

## 2016-10-07 MED ORDER — HYDROCHLOROTHIAZIDE 10 MG/ML ORAL SUSPENSION
6.2500 mg | Freq: Every day | ORAL | Status: DC
  Administered 2016-10-08: 6.25 mg via ORAL
  Filled 2016-10-07: qty 1.25

## 2016-10-07 SURGICAL SUPPLY — 14 items
CATH ACUNAV 8FR 90CM (CATHETERS) ×2 IMPLANT
CATH EXPO 5F MPA-1 (CATHETERS) ×2 IMPLANT
COVER SWIFTLINK CONNECTOR (BAG) ×2 IMPLANT
GUIDEWIRE AMPLATZER 1.5JX260 (WIRE) ×2 IMPLANT
GUIDEWIRE ANGLED .035X150CM (WIRE) ×2 IMPLANT
OCCLUDER AMPLATZER PFO 25MM (Prosthesis & Implant Heart) ×2 IMPLANT
PACK CARDIAC CATHETERIZATION (CUSTOM PROCEDURE TRAY) ×2 IMPLANT
PROTECTION STATION PRESSURIZED (MISCELLANEOUS) ×2
SHEATH INTROD W/O MIN 9FR 25CM (SHEATH) ×2 IMPLANT
SHEATH PINNACLE 6F 10CM (SHEATH) ×2 IMPLANT
SHEATH PINNACLE 8F 10CM (SHEATH) ×2 IMPLANT
STATION PROTECTION PRESSURIZED (MISCELLANEOUS) ×1 IMPLANT
SYSTEM DELIVERY AMPLATZER 8FR (SHEATH) ×2 IMPLANT
WIRE EMERALD 3MM-J .035X150CM (WIRE) ×2 IMPLANT

## 2016-10-07 NOTE — H&P (View-Only) (Signed)
Cardiology Office Note Date:  09/15/2016   ID:  Ruben Eaton, DOB 04/18/1971, MRN [id number]  PCP:  Dion Body, MD  Cardiologist:  [name], MD    Chief Complaint  Patient presents with  . PFO    consult   History of Present Illness: Ruben Eaton is a 46 y.o. male who presents for evaluation of PFO closure.   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 Also reports inability to feel hot and cold sensation on the left arm and hand. Had expressive aphasia but all of his symptoms are improved. He feels like he has to concentrate more than normal in order to speak.   MRI showed an acute right MCA branch infarct concerning for embolism and also areas of chronic cerebellar infarction. He had a positive echo bubble study on 12-13 then had a TEE the following day. This confirmed the presence of PFO with positive microcavitation study.   He's physically active with running and weight lifting. He has no exertional symptoms. Today, he denies symptoms of palpitations, chest pain, shortness of breath, orthopnea, PND, lower extremity edema, dizziness, or syncope.   Past Medical History:  Diagnosis Date  . Hypertension     Past Surgical History:  Procedure Laterality Date  . TEE WITHOUT CARDIOVERSION N/A 09/08/2016   Procedure: TRANSESOPHAGEAL ECHOCARDIOGRAM (TEE);  Surgeon: [name], MD;  Location: [facility] ORS;  Service: Cardiovascular;  Laterality: N/A;    Current Outpatient Prescriptions  Medication Sig Dispense Refill  . aspirin EC 81 MG EC tablet Take 1 tablet (81 mg total) by mouth daily. 120 tablet 0  . atorvastatin (LIPITOR) 40 MG tablet Take 1 tablet (40 mg total) by mouth daily at 6 PM. 30 tablet 0  . b complex vitamins capsule Take 1 capsule by mouth daily.     . clopidogrel (PLAVIX) 75 MG tablet Take 1 tablet (75 mg total) by mouth daily. 30 tablet 0  . Diclofenac Sodium 2 % SOLN  Apply 1 pump twice daily as needed. 112 g 3  . lisinopril-hydrochlorothiazide (PRINZIDE,ZESTORETIC) 20-12.5 MG tablet Take 0.5 tablets by mouth daily.     Marland Kitchen loratadine (CLARITIN) 10 MG tablet Take 10 mg by mouth daily.     . nitroGLYCERIN (NITRODUR - DOSED IN MG/24 HR) 0.2 mg/hr patch Place 0.2 mg onto the skin daily as needed (pain).    . Vitamin D, Ergocalciferol, (DRISDOL) 50000 units CAPS capsule Take 1 capsule (50,000 Units total) by mouth every 7 (seven) days. 12 capsule 0  . zolpidem (AMBIEN) 10 MG tablet Take 10 mg by mouth at bedtime as needed for sleep.      No current facility-administered medications for this visit.     Allergies:   Patient has no known allergies.   Social History:  The patient  reports that he has never smoked. He has never used smokeless tobacco. He reports that he does not drink alcohol.   Family History:  The patient's family history is negative for stroke or MI/CAD in any first degree relatives. His grandmother had a stroke in her mid-70's.   ROS:  Please see the history of present illness.  All other systems are reviewed and negative.   PHYSICAL EXAM: VS:  BP 140/60   Pulse 72   Ht '5\' 6"'$  (1.676 m)   Wt 165 lb 1.9 oz (74.9 kg)   BMI 26.65 kg/m  , BMI  Body mass index is 26.65 kg/m. GEN: Well nourished, well developed, in no acute distress  HEENT: normal  Neck: no JVD, no masses. No carotid bruits Cardiac: RRR without murmur or gallop                Respiratory:  clear to auscultation bilaterally, normal work of breathing GI: soft, nontender, nondistended, + BS MS: no deformity or atrophy  Ext: no pretibial edema, pedal pulses 2+= bilaterally Skin: warm and dry, no rash Neuro:  Strength and sensation are intact Psych: euthymic mood, full affect  EKG:  EKG is not ordered today.  Recent Labs: 09/07/2016: BUN 15; Creatinine, Ser 1.03; Hemoglobin 15.1; Platelets 270; Potassium 4.1; Sodium 133   Lipid Panel     Component Value Date/Time   CHOL  228 (H) 09/07/2016 0307   TRIG 44 09/07/2016 0307   HDL 108 09/07/2016 0307   CHOLHDL 2.1 09/07/2016 0307   VLDL 9 09/07/2016 0307   LDLCALC 111 (H) 09/07/2016 0307      Wt Readings from Last 3 Encounters:  09/15/16 165 lb 1.9 oz (74.9 kg)  09/07/16 168 lb 11.2 oz (76.5 kg)  06/06/16 168 lb (76.2 kg)     Cardiac Studies Reviewed: TEE: Study Conclusions  - Left ventricle: The cavity size was normal. Wall thickness was   normal. Systolic function was normal. The estimated ejection   fraction was in the range of 55% to 65%. Wall motion was normal;   there were no regional wall motion abnormalities. - Left atrium: No evidence of thrombus in the atrial cavity or   appendage. - Right atrium: No evidence of thrombus in the atrial cavity or   appendage. No evidence of thrombus in the atrial cavity or   appendage. - Atrial septum: There was a small tunneled patent foramen ovale.   Echo contrast study showed a small right-to-left atrial level   shunt, at baseline which worsened with cough.  Impressions:  - Normal study other than a small PFO.  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 The patient has MRI evidence of a recent stroke and also raises the possibility of old cerebellar strokes. He is tolerating the combination of aspirin and Plavix. We discussed the association between PFO and stroke, especially in a younger patient group. We reviewed recent randomized controlled trial data demonstrating superiority of PFO closure over medical therapy with respect to secondary stroke reduction in patients with cryptogenic stroke.   I reviewed the risks, indications, and alternatives to transcatheter closure with the patient. Specific risks include bleeding, infection, device embolization, stroke, cardiac perforation, tamponade, arrhythmia, MI, and late device erosion. He understands these serious risks occur at low incidence of < 1%. Atrial  fibrillation has been reported in up to 3-5% of patients undergoing PFO closure.   I will touch base with Dr Doy Mince to make sure she is in agreement that we pursue PFO closure in this patient.   Current medicines are reviewed with the patient today.  The patient does not have concerns regarding medicines.  Labs/ tests ordered today include:   Orders Placed This Encounter  Procedures  . CBC  . INR/PT  . Basic Metabolic Panel (BMET)  . ECHOCARDIOGRAM LIMITED   Signed, [name], MD  09/15/2016 5:46 PM    [facility] [place], [place], [place]  [address] Phone: [phone]; Fax: [phone]

## 2016-10-07 NOTE — Progress Notes (Signed)
Pt with transfer order to 3W[facility]. Report given to [facility]. V/S stable. No c/o any chest discomfort or pain. Transferred without any difficulty.

## 2016-10-07 NOTE — Interval H&P Note (Signed)
History and Physical Interval Note:  10/07/2016 9:23 AM  Ruben Eaton  has presented today for surgery, with the diagnosis of pfo  The various methods of treatment have been discussed with the patient and family. After consideration of risks, benefits and other options for treatment, the patient has consented to  Procedure(s): Patent [place](PFO) Closure (N/A) as a surgical intervention .  The patient's history has been reviewed, patient examined, no change in status, stable for surgery.  I have reviewed the patient's chart and labs.  Questions were answered to the patient's satisfaction.     [name]

## 2016-10-07 NOTE — Progress Notes (Signed)
Site area: Right groin a 9 french and 6 french venous sheaths were removed  Site Prior to Removal:  Level 0  Pressure Applied For 20 MINUTES    Bedrest Beginning at 1140am Manual:   Yes.    Patient Status During Pull:  stable  Post Pull Groin Site:  Level 0  Post Pull Instructions Given:  Yes.    Post Pull Pulses Present:  Yes.    Dressing Applied:  Yes.    Comments:  VS remain stable during sheath pull.

## 2016-10-07 NOTE — Care Management Note (Signed)
Case Management Note  Patient Details  Name: Ruben Eaton MRN: [id number] Date of Birth: 04-01-1971  Subjective/Objective:   S/p PFO closure, will be on plavix, NCM will cont to follow for dc needs.                  Action/Plan:   Expected Discharge Date:                  Expected Discharge Plan:  Home/Self Care  In-House Referral:     Discharge planning Services  CM Consult  Post Acute Care Choice:    Choice offered to:     DME Arranged:    DME Agency:     HH Arranged:    HH Agency:     Status of Service:  In process, will continue to follow  If discussed at Long Length of Stay Meetings, dates discussed:    Additional Comments:  Zenon Mayo, RN 10/07/2016, 2:59 PM

## 2016-10-08 ENCOUNTER — Encounter (HOSPITAL_COMMUNITY): Payer: Self-pay | Admitting: Physician Assistant

## 2016-10-08 DIAGNOSIS — Q211 Atrial septal defect: Secondary | ICD-10-CM | POA: Diagnosis not present

## 2016-10-08 DIAGNOSIS — Z823 Family history of stroke: Secondary | ICD-10-CM | POA: Diagnosis not present

## 2016-10-08 DIAGNOSIS — Z8673 Personal history of transient ischemic attack (TIA), and cerebral infarction without residual deficits: Secondary | ICD-10-CM | POA: Diagnosis not present

## 2016-10-08 DIAGNOSIS — I10 Essential (primary) hypertension: Secondary | ICD-10-CM | POA: Diagnosis not present

## 2016-10-08 DIAGNOSIS — Z7982 Long term (current) use of aspirin: Secondary | ICD-10-CM | POA: Diagnosis not present

## 2016-10-08 DIAGNOSIS — I493 Ventricular premature depolarization: Secondary | ICD-10-CM

## 2016-10-08 DIAGNOSIS — R29810 Facial weakness: Secondary | ICD-10-CM | POA: Diagnosis not present

## 2016-10-08 DIAGNOSIS — Z7902 Long term (current) use of antithrombotics/antiplatelets: Secondary | ICD-10-CM | POA: Diagnosis not present

## 2016-10-08 HISTORY — DX: Personal history of transient ischemic attack (TIA), and cerebral infarction without residual deficits: Z86.73

## 2016-10-08 HISTORY — DX: Ventricular premature depolarization: I49.3

## 2016-10-08 MED ORDER — CLOPIDOGREL BISULFATE 75 MG PO TABS: 75.0000 mg | ORAL_TABLET | Freq: Every day | ORAL | 5 refills | Status: DC

## 2016-10-08 NOTE — Discharge Instructions (Signed)
cath Site Care Introduction Refer to this sheet in the next few weeks. These instructions provide you with information about caring for yourself after your procedure. Your health care provider may also give you more specific instructions. Your treatment has been planned according to current medical practices, but problems sometimes occur. Call your health care provider if you have any problems or questions after your procedure. What can I expect after the procedure? After your procedure, it is typical to have the following:  Bruising at the site that usually fades within 1-2 weeks.  Blood collecting in the tissue (hematoma) that may be painful to the touch. It should usually decrease in size and tenderness within 1-2 weeks. Follow these instructions at home:  Take medicines only as directed by your health care provider.  You may shower 24-48 hours after the procedure or as directed by your health care provider. Remove the bandage (dressing) and gently wash the site with plain soap and water. Pat the area dry with a clean towel. Do not rub the site, because this may cause bleeding.  Do not take baths, swim, or use a hot tub until your health care provider approves.  Check your insertion site every day for redness, swelling, or drainage.  Do not apply powder or lotion to the site.  Do not flex or bend the affected extremity for 24 hours or as directed by your health care provider.  Do not lift heavy objects with the affected leg for 24 hours or as directed by your health care provider.  Do not lift over 10 lb (4.5 kg) for 5 days after your procedure or as directed by your health care provider.  Ask your health care provider when it is okay to:  Return to work or school.  Resume usual physical activities or sports.  Resume sexual activity.  Do not drive home if you are discharged the same day as the procedure. Have someone else drive you.  You may drive 24 hours after the procedure  unless otherwise instructed by your health care provider.  Do not operate machinery or power tools for 24 hours after the procedure.  If your procedure was done as an outpatient procedure, which means that you went home the same day as your procedure, a responsible adult should be with you for the first 24 hours after you arrive home.  Keep all follow-up visits as directed by your health care provider. This is important. Contact a health care provider if:  You have a fever.  You have chills.  You have increased bleeding from the radial site. Hold pressure on the site. Get help right away if:  You have unusual pain at the radial site.  You have redness, warmth, or swelling at the radial site.  You have drainage (other than a small amount of blood on the dressing) from the radial site.  The radial site is bleeding, and the bleeding does not stop after 30 minutes of holding steady pressure on the site.  Your arm or hand becomes pale, cool, tingly, or numb. This information is not intended to replace advice given to you by your health care provider. Make sure you discuss any questions you have with your health care provider. Document Released: 10/15/2010 Document Revised: 02/18/2016 Document Reviewed: 03/31/2014  2017 Elsevier

## 2016-10-08 NOTE — Progress Notes (Signed)
    Subjective:  Feels well. No CP or dyspnea.   Objective:  Vital Signs in the last 24 hours: Temp:  [97.4 F (36.3 C)-98.4 F (36.9 C)] 97.9 F (36.6 C) (01/13 0500) Pulse Rate:  [55-77] 62 (01/13 0500) Resp:  [10-26] 17 (01/13 0500) BP: (99-128)/(53-85) 107/63 (01/13 0500) SpO2:  [96 %-100 %] 98 % (01/13 0500) Weight:  [163 lb 1.6 oz (74 kg)] 163 lb 1.6 oz (74 kg) (01/13 0500)  Intake/Output from previous day: 01/12 0701 - 01/13 0700 In: 672.2 [P.O.:360; I.V.:312.2] Out: -   Physical Exam: Pt is alert and oriented, NAD HEENT: normal Neck: JVP - normal Ext: no C/C/E, bilateral groin sites clear Skin: warm/dry no rash   Lab Results: No results for input(s): WBC, HGB, PLT in the last 72 hours. No results for input(s): NA, K, CL, CO2, GLUCOSE, BUN, CREATININE in the last 72 hours. No results for input(s): TROPONINI in the last 72 hours.  Invalid input(s): CK, MB  Cardiac Studies: Post-procedure echo: Study Conclusions  - Left ventricle: Bigeminal rhythm during study. The cavity size   was mildly dilated. Wall thickness was normal. Systolic function   was normal. The estimated ejection fraction was in the range of   50% to 55%. - Right atrium: The atrium was mildly dilated. - Atrial septum: Clam shell occluder across septum with no residual   PFO noted.  Tele: Sinus rhythm with PVC's  Assessment/Plan:  PFO s/p transcatheter closure: echo reviewed and appropriate device position. Plan:  DC home this am  ASA 81 mg indefinite  plavix 75 mg x 6 months  SBE prophylaxis when indicated x 6 months  Ok to drive tomorrow  Light activity x one week  Will arrange follow-up. Will cx one month echo - 6 months echo with bubble.  PVC's, benign: longstanding per patient. He is asymptomatic.    [name], M.D. 10/08/2016, 9:27 AM

## 2016-10-08 NOTE — Progress Notes (Signed)
NURSING PROGRESS NOTE  Ruben Eaton [id number] Discharge Data: 10/08/2016 4:10 PM Attending Provider: No att. providers found [id number], Lucianne Muss, MD     [name] to be D/C'd Home per MD order.  Discussed with the patient the After Visit Summary and all questions fully answered. All IV's discontinued with no bleeding noted. All belongings returned to patient for patient to take home.   Last Vital Signs:  Blood pressure 107/63, pulse 62, temperature 97.9 F (36.6 C), resp. rate 17, height '5\' 6"'$  (1.676 m), weight 74 kg (163 lb 1.6 oz), SpO2 98 %.  Discharge Medication List Allergies as of 10/08/2016   No Known Allergies     Medication List    STOP taking these medications   ibuprofen 200 MG tablet Commonly known as:  ADVIL,MOTRIN     TAKE these medications   aspirin 81 MG EC tablet Take 1 tablet (81 mg total) by mouth daily.   atorvastatin 40 MG tablet Commonly known as:  LIPITOR Take 1 tablet (40 mg total) by mouth daily at 6 PM.   b complex vitamins capsule Take 1 capsule by mouth daily.   clopidogrel 75 MG tablet Commonly known as:  PLAVIX Take 1 tablet (75 mg total) by mouth daily.   desonide 0.05 % cream Commonly known as:  DESOWEN Apply 1 application topically daily as needed (rash).   Diclofenac Sodium 2 % Soln Apply 1 pump twice daily as needed. What changed:  how much to take  how to take this  when to take this  reasons to take this  additional instructions   lisinopril-hydrochlorothiazide 20-12.5 MG tablet Commonly known as:  PRINZIDE,ZESTORETIC Take 0.5 tablets by mouth daily.   loratadine 10 MG tablet Commonly known as:  CLARITIN Take 10 mg by mouth daily.   nitroGLYCERIN 0.2 mg/hr patch Commonly known as:  NITRODUR - Dosed in mg/24 hr Place 0.2 mg onto the skin daily as needed (pain).   tadalafil 5 MG tablet Commonly known as:  CIALIS Take 5 mg by mouth daily as needed for erectile dysfunction.   triamcinolone 55  MCG/ACT Aero nasal inhaler Commonly known as:  NASACORT Place 2 sprays into the nose daily as needed (congestion).   Vitamin D (Ergocalciferol) 50000 units Caps capsule Commonly known as:  DRISDOL Take 1 capsule (50,000 Units total) by mouth every 7 (seven) days. What changed:  additional instructions   zolpidem 10 MG tablet Commonly known as:  AMBIEN Take 10 mg by mouth at bedtime as needed for sleep.

## 2016-10-08 NOTE — Discharge Summary (Signed)
Discharge Summary    Patient ID: Ruben Eaton,  MRN: [id number], DOB/AGE: 46-Jun-1972 46 y.o.  Admit date: 10/07/2016 Discharge date: 10/08/2016  Primary Care Provider: Dion Body Primary Cardiologist: Dr. [name]  Discharge Diagnoses    Active Problems:   PFO (patent foramen ovale)   History of stroke   PVC's (premature ventricular contractions)    Diagnostic Studies/Procedures    PFO Closure 10/07/16 Conclusion   Successful Transcatheter PFO Closure using a 25 mm Amplatzer PFO occluder  Indications   PFO (patent foramen ovale) [Q21.1 (ICD-10-CM)]  Procedural Details/Technique   Technical Details INDICATION: PFO with cryptogenic stroke  PROCEDURAL DETAILS:  The right groin was prepped, draped, and anesthetized with 1% lidocaine. Using modified Seldinger technique, a 6 French sheath and a 9 French long sheath were introduced into the right femoral vein. Intracardiac echo is performed per protocol. The interatrial septum is well-visualized and there is a moderate-sized PFO with continuous color-flow Doppler demonstrating right to left shunt. A complete study is performed and no other significant abnormalities are seen. Heparin is administered for anticoagulation. A 25 mm Amplatzer devices prepped on the table using normal technique. The 6 French sheath was changed out for an 8 FrenchTorqVue sheath which is advanced across the interatrial septum after the septum is crossed with the multipurpose catheter and a Glidewire changed out for a Amplatzer extra stiff wire in the left upper pulmonary vein. The 25 mm Amplatzer PFO occluder is advanced across the interatrial septum, the left disc is released and the left atrium, pulled back flush against the septum, and then the right disc is released on the right atrial side. Fluoroscopy and intracardiac echo were performed and a devices in appropriate position. The device is released. Intracardiac echo is again performed and  demonstrates appropriate position without complicating features. The 8 French sheath is changed out for a short 8 [place] sheath. Intracardiac echo is removed. There were no immediate procedural complications. The patient was transferred to the post catheterization recovery area for further monitoring.     Estimated blood loss <50 mL.  During this procedure the patient was administered the following to achieve and maintain moderate conscious sedation: Versed 6 mg, Fentanyl 125 mcg, while the patient's heart rate, blood pressure, and oxygen saturation were continuously monitored. The period of conscious sedation was 46 minutes, of which I was present face-to-face 100% of this time.    Right Heart   PFO/ASD There is a PFO. There is not an atrial septal aneurysm present. Agitated saline study indicates right-to-left shunt at rest. Balloon sizing was not performed. Intracardiac echo used for procedural guidance. 25 mm Amplatzer PFO Occluder was used to close the defect. There were no immediate procedural complications.     2D echo 10/07/16 Study Conclusions  - Left ventricle: Bigeminal rhythm during study. The cavity size   was mildly dilated. Wall thickness was normal. Systolic function   was normal. The estimated ejection fraction was in the range of   50% to 55%. - Right atrium: The atrium was mildly dilated. - Atrial septum: Clam shell occluder across septum with no residual   PFO noted.   _____________     History of Present Illness     Ruben Eaton is a 46 y.o. male with history of recent stroke, PFO, ED, OSA on CPAP, insomnia, HTN who presented to [facility] for planned PFO closure. 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 MRI showed an acute right MCA branch infarct concerning for embolism and also areas of chronic cerebellar  infarction. He had a positive echo bubble study on 09/07/16 then had a TEE the following day. This confirmed the presence of PFO with positive microcavitation study. He was discharged on aspirin and Plavix. He presented to Dr. [name] for evaluation who recommended PFO closure.   Hospital Course    The patient presented for this procedure 10/07/2016. Agitated saline study indicated right-to-left shunt at rest. Balloon sizing was not performed. Intracardiac echo was used for procedural guidance. 25 mm Amplatzer PFO Occluder was used to close the defect. There were no immediate procedural complications. Limited 2D echo showed EF 50-55%, mildly dilated RA, clam shell occluder across septum with no residual PFO noted. The patient was also noted to have PVCs which are longstanding per patient and asymptomatic. The patient is doing well this morning. Dr. [name] has seen and examined the patient today and feels he is stable for discharge. He recommends ASA '81mg'$  indefinitely, Plavix '75mg'$  for 6 months (refills sent in), SBE prophylaxis when indicated x 6 months. The patient was advised to contact our office if undergoing any dental work. Activity restrictions reviewed below. He was also advised to only use NSAIDS sparingly while on DAPT. He has sent a message to his nurse to cancel 1 month echo and arrange 6 month echo with bubble study as well as follow-up.  _____________  Discharge Vitals Blood pressure 107/63, pulse 62, temperature 97.9 F (36.6 C), resp. rate 17, height '5\' 6"'$  (1.676 m), weight 163 lb 1.6 oz (74 kg), SpO2 98 %.  Filed Weights   10/07/16 0727 10/08/16 0500  Weight: 165 lb (74.8 kg) 163 lb 1.6 oz (74 kg)    Labs & Radiologic Studies   _____________  No results found. Disposition   Pt is being discharged home today in good condition.  Follow-up Plans & Appointments    Follow-up Information    [name], MD Follow up.   Specialty:  Cardiology Why:  The office will call you to  schedule your follow-up echocardiogram and appointment with Dr. [name]. Please call if you have not heard back within 3 days. Dr. [name] plans on moving your echocardiogram back to 6 months. Contact information: [address] [address] [phone]          Discharge Instructions    Diet - low sodium heart healthy    Complete by:  As directed    Increase activity slowly    Complete by:  As directed    Progress activity slowly - light activity only for 1 week. You may drive tomorrow if feeling well. Keep procedure site clean & dry. If you notice increased pain, swelling, bleeding or pus, call/return!  You may shower, but no soaking baths/hot tubs/pools for 1 week.   The plan is to continue aspirin indefinitely, and Plavix for 6 months. Refills for Plavix were sent in to your pharmacy.  If you are planning to have any dental work, please contact our office in advance to prescribe antibiotic prophylaxis to prevent infection.  You should not use any preparations of nitroglycerin if you have taken tidalafil (Cialis) within the last 48 hours. The opposite is true as well - do not take any tidalafil (Cialis) if you have used nitroglycerin within the last 48 hours.  Patients  taking blood thinners should generally stay away from medicines like ibuprofen, Advil, Motrin, naproxen, and Aleve due to risk of stomach bleeding. These should be used sparingly only. They can also increase risk of cardiovascular events. You may take Tylenol as directed or talk to your primary doctor about alternatives.      Discharge Medications   Allergies as of 10/08/2016   No Known Allergies     Medication List    STOP taking these medications   ibuprofen 200 MG tablet Commonly known as:  ADVIL,MOTRIN     TAKE these medications   aspirin 81 MG EC tablet Take 1 tablet (81 mg total) by mouth daily.   atorvastatin 40 MG tablet Commonly known as:  LIPITOR Take 1 tablet (40 mg  total) by mouth daily at 6 PM.   b complex vitamins capsule Take 1 capsule by mouth daily.   clopidogrel 75 MG tablet Commonly known as:  PLAVIX Take 1 tablet (75 mg total) by mouth daily.   desonide 0.05 % cream Commonly known as:  DESOWEN Apply 1 application topically daily as needed (rash).   Diclofenac Sodium 2 % Soln Apply 1 pump twice daily as needed. What changed:  how much to take  how to take this  when to take this  reasons to take this  additional instructions   lisinopril-hydrochlorothiazide 20-12.5 MG tablet Commonly known as:  PRINZIDE,ZESTORETIC Take 0.5 tablets by mouth daily.   loratadine 10 MG tablet Commonly known as:  CLARITIN Take 10 mg by mouth daily.   nitroGLYCERIN 0.2 mg/hr patch Commonly known as:  NITRODUR - Dosed in mg/24 hr Place 0.2 mg onto the skin daily as needed (pain).   tadalafil 5 MG tablet Commonly known as:  CIALIS Take 5 mg by mouth daily as needed for erectile dysfunction.   triamcinolone 55 MCG/ACT Aero nasal inhaler Commonly known as:  NASACORT Place 2 sprays into the nose daily as needed (congestion).   Vitamin D (Ergocalciferol) 50000 units Caps capsule Commonly known as:  DRISDOL Take 1 capsule (50,000 Units total) by mouth every 7 (seven) days. What changed:  additional instructions   zolpidem 10 MG tablet Commonly known as:  AMBIEN Take 10 mg by mouth at bedtime as needed for sleep.        Allergies:  No Known Allergies   Outstanding Labs/Studies   Pending repeat echo 6 months  Duration of Discharge Encounter   Greater than 30 minutes including physician time.  Signed, Charlie Pitter PA-C 10/08/2016, 10:27 AM

## 2016-10-10 ENCOUNTER — Encounter (HOSPITAL_COMMUNITY): Payer: Self-pay | Admitting: Cardiovascular Disease

## 2016-10-11 DIAGNOSIS — Z8673 Personal history of transient ischemic attack (TIA), and cerebral infarction without residual deficits: Secondary | ICD-10-CM | POA: Diagnosis not present

## 2016-10-11 DIAGNOSIS — G4733 Obstructive sleep apnea (adult) (pediatric): Secondary | ICD-10-CM | POA: Diagnosis not present

## 2016-10-11 DIAGNOSIS — I63511 Cerebral infarction due to unspecified occlusion or stenosis of right middle cerebral artery: Secondary | ICD-10-CM | POA: Diagnosis not present

## 2016-10-11 DIAGNOSIS — Z9989 Dependence on other enabling machines and devices: Secondary | ICD-10-CM | POA: Diagnosis not present

## 2016-10-12 ENCOUNTER — Encounter: Payer: Self-pay | Admitting: Cardiovascular Disease

## 2016-10-27 DIAGNOSIS — Z9989 Dependence on other enabling machines and devices: Secondary | ICD-10-CM

## 2016-10-27 DIAGNOSIS — G4733 Obstructive sleep apnea (adult) (pediatric): Secondary | ICD-10-CM | POA: Insufficient documentation

## 2016-11-07 ENCOUNTER — Other Ambulatory Visit: Payer: Self-pay

## 2017-02-06 DIAGNOSIS — R899 Unspecified abnormal finding in specimens from other organs, systems and tissues: Secondary | ICD-10-CM | POA: Diagnosis not present

## 2017-02-06 DIAGNOSIS — I10 Essential (primary) hypertension: Secondary | ICD-10-CM | POA: Diagnosis not present

## 2017-02-07 DIAGNOSIS — N529 Male erectile dysfunction, unspecified: Secondary | ICD-10-CM | POA: Diagnosis not present

## 2017-02-07 DIAGNOSIS — I10 Essential (primary) hypertension: Secondary | ICD-10-CM | POA: Diagnosis not present

## 2017-02-07 DIAGNOSIS — Z8673 Personal history of transient ischemic attack (TIA), and cerebral infarction without residual deficits: Secondary | ICD-10-CM | POA: Diagnosis not present

## 2017-02-07 DIAGNOSIS — G4733 Obstructive sleep apnea (adult) (pediatric): Secondary | ICD-10-CM | POA: Diagnosis not present

## 2017-02-13 DIAGNOSIS — G4733 Obstructive sleep apnea (adult) (pediatric): Secondary | ICD-10-CM | POA: Diagnosis not present

## 2017-02-13 DIAGNOSIS — Z8673 Personal history of transient ischemic attack (TIA), and cerebral infarction without residual deficits: Secondary | ICD-10-CM | POA: Diagnosis not present

## 2017-02-13 DIAGNOSIS — Z9989 Dependence on other enabling machines and devices: Secondary | ICD-10-CM | POA: Diagnosis not present

## 2017-03-09 DIAGNOSIS — G4733 Obstructive sleep apnea (adult) (pediatric): Secondary | ICD-10-CM | POA: Diagnosis not present

## 2017-03-21 ENCOUNTER — Telehealth: Payer: Self-pay | Admitting: Cardiovascular Disease

## 2017-03-21 DIAGNOSIS — I493 Ventricular premature depolarization: Secondary | ICD-10-CM

## 2017-03-21 NOTE — Telephone Encounter (Signed)
New message     Pt called to schedule appt with [name] and there is a notes that say Echo w bubble study, there is not order for an echo,  Could you please put the order in?  Thank you

## 2017-03-21 NOTE — Telephone Encounter (Signed)
The order has been placed.

## 2017-03-24 ENCOUNTER — Telehealth: Payer: Self-pay | Admitting: Cardiovascular Disease

## 2017-03-24 DIAGNOSIS — Q2112 Patent foramen ovale: Secondary | ICD-10-CM

## 2017-03-24 DIAGNOSIS — Q211 Atrial septal defect: Secondary | ICD-10-CM

## 2017-03-24 NOTE — Telephone Encounter (Signed)
Order has been placed for echo.  Will send message to schedulers to schedule pt for echo and Dr. [name] same day.

## 2017-03-24 NOTE — Telephone Encounter (Signed)
Spoke with pt and made him aware that I would send message to Dr. Antionette Char nurse, Ander Purpura and have her f/u with him next week on this.  Advised appts can be changed if this is incorrect.  Pt verbalized understanding and was appreciative for call.

## 2017-03-24 NOTE — Telephone Encounter (Signed)
°  New Prob   Pt states he is supposed to schedule his echocardiogram prior to seeing Dr. [name]. No order in EPIC.

## 2017-03-24 NOTE — Telephone Encounter (Signed)
New Message     Pt wants to make sure that seeing Vin on 7/18 is okay for follow up to echo?

## 2017-03-28 NOTE — Telephone Encounter (Signed)
Left message for pt to contact the office in regards to getting an appointment with Dr [name] on 04/18/17.

## 2017-03-31 ENCOUNTER — Encounter: Payer: Self-pay | Admitting: Cardiovascular Disease

## 2017-03-31 NOTE — Telephone Encounter (Signed)
This encounter was created in error - please disregard.

## 2017-03-31 NOTE — Telephone Encounter (Signed)
Patient calling, states that he is returning your call.

## 2017-03-31 NOTE — Telephone Encounter (Signed)
Left message on machine for pt to contact the office.   

## 2017-03-31 NOTE — Telephone Encounter (Signed)
I spoke with the pt and scheduled him to follow-up with Dr [name] on 04/24/17.

## 2017-04-10 ENCOUNTER — Other Ambulatory Visit: Payer: Self-pay

## 2017-04-10 ENCOUNTER — Ambulatory Visit (HOSPITAL_COMMUNITY): Payer: 59 | Attending: Cardiovascular Disease

## 2017-04-10 ENCOUNTER — Encounter: Payer: Self-pay | Admitting: *Deleted

## 2017-04-10 DIAGNOSIS — Q211 Atrial septal defect: Secondary | ICD-10-CM | POA: Diagnosis not present

## 2017-04-10 DIAGNOSIS — Q2112 Patent foramen ovale: Secondary | ICD-10-CM

## 2017-04-12 ENCOUNTER — Ambulatory Visit: Payer: Self-pay | Admitting: Physician Assistant

## 2017-04-24 ENCOUNTER — Ambulatory Visit: Payer: Self-pay | Admitting: Cardiovascular Disease

## 2017-05-19 NOTE — Progress Notes (Signed)
Cardiology Office Note    Date:  05/22/2017   ID:  Ruben Eaton, DOB 01/15/1971, MRN [id number]  PCP:  Ruben Body, MD  Cardiologist:  Dr. Burt Eaton  Chief Complaint: PFO follow up   History of Present Illness:   Ruben Eaton is a 46 y.o. male with hx of CVA., PFO s/p closer, ED, OSA on CPAP and HTN presented for follow up.   Patient had acute CVA 09/07/16. Had positive echo with bubble study. Underwent successful placement of 25 mm Amplatzer PFO Occluder 10/07/16.   Last echo 04/10/17 showed normal visualization of device without shunt. Here today for follow up. Treated with DAPT x 6 months.   Here for follow up. The patient denies nausea, vomiting, fever, chest pain, palpitations, shortness of breath, orthopnea, PND, dizziness, syncope, cough, congestion, abdominal pain, hematochezia, melena, lower extremity edema. BP of 100/86 today. Usually runs in 120/70 at home.    Past Medical History:  Diagnosis Date  . 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 ED (erectile dysfunction)   . Hx of insomnia   . Hypertension   . OSA on CPAP   . PFO (patent foramen ovale)    a. s/p closure 09/2016 by Dr. Burt Eaton.    Past Surgical History:  Procedure Laterality Date  . APPENDECTOMY  1980  . EYE SURGERY    . PATENT FORAMEN OVALE CLOSURE  10/07/2016  . PATENT FORAMEN OVALE CLOSURE N/A 10/07/2016   Procedure: Patent [place]ament [place](PFO) Closure;  Surgeon: Ruben Mocha, MD;  Location: [facility];  Service: Cardiovascular;  Laterality: N/A;  . STRABISMUS SURGERY Left 1984  . TEE WITHOUT CARDIOVERSION N/A 09/08/2016   Procedure: TRANSESOPHAGEAL ECHOCARDIOGRAM (TEE);  Surgeon: Ruben Hampshire, MD;  Location: [facility];  Service: Cardiovascular;  Laterality: N/A;  . VITRECTOMY Right 2013    Current Medications: Prior to Admission medications   Medication Sig Start Date End Date Taking? Authorizing Provider  aspirin EC 81 MG EC  tablet Take 1 tablet (81 mg total) by mouth daily. 09/08/16   Ruben Costa, MD  atorvastatin (LIPITOR) 40 MG tablet Take 1 tablet (40 mg total) by mouth daily at 6 PM. 09/08/16   Ruben Costa, MD  b complex vitamins capsule Take 1 capsule by mouth daily.     [provider]  clopidogrel (PLAVIX) 75 MG tablet Take 1 tablet (75 mg total) by mouth daily. 10/08/16   Dunn, Ruben Hai, PA-C  desonide (DESOWEN) 0.05 % cream Apply 1 application topically daily as needed (rash).    [provider]  Diclofenac Sodium 2 % SOLN Apply 1 pump twice daily as needed. Patient taking differently: Apply 1 application topically 2 (two) times daily as needed (pain). Apply 1 pump twice daily as needed. 02/01/16   Lyndal Pulley, DO  lisinopril-hydrochlorothiazide (PRINZIDE,ZESTORETIC) 20-12.5 MG tablet Take 0.5 tablets by mouth daily.  08/24/15   [provider]  loratadine (CLARITIN) 10 MG tablet Take 10 mg by mouth daily.     [provider]  nitroGLYCERIN (NITRODUR - DOSED IN MG/24 HR) 0.2 mg/hr patch Place 0.2 mg onto the skin daily as needed (pain).    [provider]  tadalafil (CIALIS) 5 MG tablet Take 5 mg by mouth daily as needed for erectile dysfunction.    [provider]  triamcinolone (NASACORT) 55 MCG/ACT AERO nasal inhaler Place 2 sprays into the nose daily as needed (congestion).     [provider]  Vitamin D, Ergocalciferol, (DRISDOL) 50000 units CAPS capsule Take 1 capsule (50,000 Units total) by mouth every 7 (seven) days. Patient taking differently: Take 50,000 Units by mouth every 7 (seven) days. Mondays 05/05/16   Lyndal Pulley, DO  zolpidem (AMBIEN) 10 MG tablet Take 10 mg by mouth at bedtime as needed for sleep.  12/10/15   [provider]    Allergies:   Patient has no known allergies.   Social History   Social History  . Marital status: Married    Spouse name: N/A  . Number of children: N/A  . Years of education: N/A    Occupational History  . pharmacist    Social History Main Topics  . Smoking status: Former Smoker    Packs/day: 0.50    Years: 10.00    Quit date: 2011  . Smokeless tobacco: Never Used  . Alcohol use 1.8 oz/week    3 Cans of beer per week  . Drug use: No  . Sexual activity: Yes   Other Topics Concern  . None   Social History Narrative  . None     Family History:  The patient's family history is not on file.   ROS:   Please see the history of present illness.    ROS All other systems reviewed and are negative.   PHYSICAL EXAM:   VS:  BP 100/86   Pulse 70   Ht 5\' 6"  (1.676 m)   Wt 165 lb (74.8 kg)   SpO2 97%   BMI 26.63 kg/m    GEN: Well nourished, well developed, in no acute distress  HEENT: normal  Neck: no JVD, carotid bruits, or masses Cardiac: RRR; no murmurs, rubs, or gallops,no edema  Respiratory:  clear to auscultation bilaterally, normal work of breathing GI: soft, nontender, nondistended, + BS MS: no deformity or atrophy  Skin: warm and dry, no rash Neuro:  Alert and Oriented x 3, Strength and sensation are intact Psych: euthymic mood, full affect  Wt Readings from Last 3 Encounters:  05/22/17 165 lb (74.8 kg)  10/08/16 163 lb 1.6 oz (74 kg)  09/15/16 165 lb 1.9 oz (74.9 kg)      Studies/Labs Reviewed:   EKG:  EKG is not  ordered today.    Recent Labs: 10/05/2016: BUN 17; Creatinine, Ser 1.06; Hemoglobin 13.2; Platelets 250; Potassium 4.2; Sodium 141   Lipid Panel    Component Value Date/Time   CHOL 228 (H) 09/07/2016 0307   TRIG 44 09/07/2016 0307   HDL 108 09/07/2016 0307   CHOLHDL 2.1 09/07/2016 0307   VLDL 9 09/07/2016 0307   LDLCALC 111 (H) 09/07/2016 0307    Additional studies/ records that were reviewed today include:   As above  ASSESSMENT & PLAN:    1. PFO s/p Transcatheter closer 09/2016 - Last echo 04/10/17 showed normal visualization of device without shunt. Asymptomatic. Stop Plavix. Continue ASA.   2. CVA - Per  neurology. Continue ASA and statin. Neurologist has recomended ILR/event monitor. Given his stoke likely from PFO and asymptomatic--> Dr. Burt Eaton did not recommended loop recorder or additional testing and patient is agree with this.   3. HTN - Continue current regimen. Keep log. BP stable at home.   Medication Adjustments/Labs and Tests Ordered: Current medicines are reviewed at length with the patient today.  Concerns regarding medicines are outlined above.  Medication changes, Labs and Tests ordered today are listed in the Patient Instructions below. Patient Instructions  Medication Instructions:  Your physician recommends that you continue on your current medications as directed. Please refer to the Current Medication list given to you today.  Labwork: None   Testing/Procedures: None   Follow-Up: Your physician wants you to follow-up in: 6 months with Dr Ruben Eaton. You will receive a reminder letter in the mail two months in advance. If you don't receive a letter, please call our office to schedule the follow-up appointment.  Any Other Special Instructions Will Be Listed Below (If Applicable).  If you need a refill on your cardiac medications before your next appointment, please call your pharmacy.     Jarrett Soho, [place]  05/22/2017 9:49 AM    [facility] [place], [place], [place]  [address] Phone: [phone]; Fax: [phone]

## 2017-05-22 ENCOUNTER — Ambulatory Visit (INDEPENDENT_AMBULATORY_CARE_PROVIDER_SITE_OTHER): Payer: 59 | Admitting: Physician Assistant

## 2017-05-22 ENCOUNTER — Encounter: Payer: Self-pay | Admitting: Physician Assistant

## 2017-05-22 VITALS — BP 100/86 | HR 70 | Ht 66.0 in | Wt 165.0 lb

## 2017-05-22 DIAGNOSIS — I10 Essential (primary) hypertension: Secondary | ICD-10-CM | POA: Diagnosis not present

## 2017-05-22 DIAGNOSIS — Q211 Atrial septal defect: Secondary | ICD-10-CM | POA: Diagnosis not present

## 2017-05-22 DIAGNOSIS — Q2112 Patent foramen ovale: Secondary | ICD-10-CM

## 2017-05-22 NOTE — Patient Instructions (Addendum)
Medication Instructions:  STOP Plavix  Labwork: None   Testing/Procedures: None   Follow-Up: Your physician wants you to follow-up in: 6 months with Dr [name]. You will receive a reminder letter in the mail two months in advance. If you don't receive a letter, please call our office to schedule the follow-up appointment.  Any Other Special Instructions Will Be Listed Below (If Applicable).  If you need a refill on your cardiac medications before your next appointment, please call your pharmacy.

## 2017-08-25 DIAGNOSIS — Z8673 Personal history of transient ischemic attack (TIA), and cerebral infarction without residual deficits: Secondary | ICD-10-CM | POA: Diagnosis not present

## 2017-08-25 DIAGNOSIS — I10 Essential (primary) hypertension: Secondary | ICD-10-CM | POA: Diagnosis not present

## 2017-09-01 DIAGNOSIS — Z0000 Encounter for general adult medical examination without abnormal findings: Secondary | ICD-10-CM | POA: Diagnosis not present

## 2017-09-01 DIAGNOSIS — I10 Essential (primary) hypertension: Secondary | ICD-10-CM | POA: Diagnosis not present

## 2017-09-01 DIAGNOSIS — Z8673 Personal history of transient ischemic attack (TIA), and cerebral infarction without residual deficits: Secondary | ICD-10-CM | POA: Diagnosis not present

## 2017-09-01 DIAGNOSIS — Z7189 Other specified counseling: Secondary | ICD-10-CM | POA: Insufficient documentation

## 2017-09-01 DIAGNOSIS — Z7185 Encounter for immunization safety counseling: Secondary | ICD-10-CM | POA: Insufficient documentation

## 2017-09-20 DIAGNOSIS — G4733 Obstructive sleep apnea (adult) (pediatric): Secondary | ICD-10-CM | POA: Diagnosis not present

## 2018-02-21 ENCOUNTER — Inpatient Hospital Stay
Admission: EM | Admit: 2018-02-21 | Discharge: 2018-02-27 | DRG: 167 | Disposition: A | Discharge status: Home / Self Care | Payer: No Typology Code available for payment source | Source: Ambulatory Visit | Attending: Internal Medicine | Admitting: Internal Medicine

## 2018-02-21 ENCOUNTER — Emergency Department: Payer: No Typology Code available for payment source

## 2018-02-21 ENCOUNTER — Other Ambulatory Visit: Payer: Self-pay

## 2018-02-21 DIAGNOSIS — C3491 Malignant neoplasm of unspecified part of right bronchus or lung: Principal | ICD-10-CM | POA: Diagnosis present

## 2018-02-21 DIAGNOSIS — Z8673 Personal history of transient ischemic attack (TIA), and cerebral infarction without residual deficits: Secondary | ICD-10-CM

## 2018-02-21 DIAGNOSIS — I10 Essential (primary) hypertension: Secondary | ICD-10-CM | POA: Diagnosis present

## 2018-02-21 DIAGNOSIS — Z9989 Dependence on other enabling machines and devices: Secondary | ICD-10-CM | POA: Diagnosis not present

## 2018-02-21 DIAGNOSIS — I480 Paroxysmal atrial fibrillation: Secondary | ICD-10-CM | POA: Diagnosis present

## 2018-02-21 DIAGNOSIS — Z9889 Other specified postprocedural states: Secondary | ICD-10-CM | POA: Diagnosis not present

## 2018-02-21 DIAGNOSIS — E222 Syndrome of inappropriate secretion of antidiuretic hormone: Secondary | ICD-10-CM | POA: Diagnosis present

## 2018-02-21 DIAGNOSIS — J90 Pleural effusion, not elsewhere classified: Secondary | ICD-10-CM | POA: Diagnosis not present

## 2018-02-21 DIAGNOSIS — Z9689 Presence of other specified functional implants: Secondary | ICD-10-CM

## 2018-02-21 DIAGNOSIS — J910 Malignant pleural effusion: Secondary | ICD-10-CM | POA: Diagnosis not present

## 2018-02-21 DIAGNOSIS — Z79899 Other long term (current) drug therapy: Secondary | ICD-10-CM | POA: Diagnosis not present

## 2018-02-21 DIAGNOSIS — R590 Localized enlarged lymph nodes: Secondary | ICD-10-CM | POA: Diagnosis not present

## 2018-02-21 DIAGNOSIS — R0600 Dyspnea, unspecified: Secondary | ICD-10-CM | POA: Diagnosis not present

## 2018-02-21 DIAGNOSIS — Z09 Encounter for follow-up examination after completed treatment for conditions other than malignant neoplasm: Secondary | ICD-10-CM

## 2018-02-21 DIAGNOSIS — R0602 Shortness of breath: Secondary | ICD-10-CM

## 2018-02-21 DIAGNOSIS — I493 Ventricular premature depolarization: Secondary | ICD-10-CM | POA: Diagnosis present

## 2018-02-21 DIAGNOSIS — F411 Generalized anxiety disorder: Secondary | ICD-10-CM | POA: Diagnosis present

## 2018-02-21 DIAGNOSIS — R918 Other nonspecific abnormal finding of lung field: Secondary | ICD-10-CM | POA: Diagnosis not present

## 2018-02-21 DIAGNOSIS — Z87891 Personal history of nicotine dependence: Secondary | ICD-10-CM | POA: Diagnosis not present

## 2018-02-21 DIAGNOSIS — G4733 Obstructive sleep apnea (adult) (pediatric): Secondary | ICD-10-CM | POA: Diagnosis not present

## 2018-02-21 HISTORY — DX: Pleural effusion, not elsewhere classified: J90

## 2018-02-21 LAB — BASIC METABOLIC PANEL
Anion gap: 10 (ref 5–15)
BUN: 13 mg/dL (ref 6–20)
CHLORIDE: 92 mmol/L — AB (ref 101–111)
CO2: 25 mmol/L (ref 22–32)
Calcium: 8.7 mg/dL — ABNORMAL LOW (ref 8.9–10.3)
Creatinine, Ser: 0.89 mg/dL (ref 0.61–1.24)
GFR calc non Af Amer: >60 mL/min (ref >60)
GLUCOSE: 125 mg/dL — AB (ref 65–99)
POTASSIUM: 3.7 mmol/L (ref 3.5–5.1)
Sodium: 127 mmol/L — ABNORMAL LOW (ref 135–145)

## 2018-02-21 LAB — CBC
HCT: 41.8 % (ref 40.0–52.0)
HEMOGLOBIN: 14.5 g/dL (ref 13.0–18.0)
MCH: 32.6 pg (ref 26.0–34.0)
MCHC: 34.6 g/dL (ref 32.0–36.0)
MCV: 94.2 fL (ref 80.0–100.0)
Platelets: 307 10*3/uL (ref 150–440)
RBC: 4.44 MIL/uL (ref 4.40–5.90)
RDW: 13.1 % (ref 11.5–14.5)
WBC: 9.9 10*3/uL (ref 3.8–10.6)

## 2018-02-21 LAB — TROPONIN I

## 2018-02-21 LAB — CREATININE, SERUM
CREATININE: 0.89 mg/dL (ref 0.61–1.24)
GFR calc non Af Amer: >60 mL/min (ref >60)

## 2018-02-21 MED ORDER — ATORVASTATIN CALCIUM 20 MG PO TABS
40.0000 mg | ORAL_TABLET | Freq: Every day | ORAL | Status: DC
  Administered 2018-02-21 – 2018-02-22 (×2): 40 mg via ORAL
  Filled 2018-02-21 (×2): qty 2

## 2018-02-21 MED ORDER — MORPHINE SULFATE (PF) 2 MG/ML IV SOLN
INTRAVENOUS | Status: AC
  Filled 2018-02-21: qty 1

## 2018-02-21 MED ORDER — CEFTRIAXONE SODIUM 1 G IJ SOLR
1.0000 g | INTRAMUSCULAR | Status: DC
  Filled 2018-02-21: qty 10

## 2018-02-21 MED ORDER — LISINOPRIL 10 MG PO TABS
10.0000 mg | ORAL_TABLET | Freq: Every day | ORAL | Status: DC
  Administered 2018-02-22: 10 mg via ORAL
  Filled 2018-02-21: qty 1

## 2018-02-21 MED ORDER — ONDANSETRON HCL 4 MG/2ML IJ SOLN
4.0000 mg | Freq: Four times a day (QID) | INTRAMUSCULAR | Status: DC | PRN
  Administered 2018-02-23 (×2): 4 mg via INTRAVENOUS

## 2018-02-21 MED ORDER — LORAZEPAM 1 MG PO TABS
1.0000 mg | ORAL_TABLET | Freq: Every evening | ORAL | Status: DC | PRN
  Administered 2018-02-21 – 2018-02-22 (×2): 1 mg via ORAL
  Filled 2018-02-21 (×2): qty 1

## 2018-02-21 MED ORDER — ONDANSETRON HCL 4 MG PO TABS: 4.0000 mg | ORAL_TABLET | Freq: Four times a day (QID) | ORAL | Status: DC | PRN

## 2018-02-21 MED ORDER — HEPARIN SODIUM (PORCINE) 5000 UNIT/ML IJ SOLN
5000.0000 [IU] | Freq: Three times a day (TID) | INTRAMUSCULAR | Status: DC
  Administered 2018-02-21 – 2018-02-22 (×2): 5000 [IU] via SUBCUTANEOUS
  Filled 2018-02-21 (×2): qty 1

## 2018-02-21 MED ORDER — SODIUM CHLORIDE 0.9 % IV SOLN
1.0000 g | INTRAVENOUS | Status: DC
  Administered 2018-02-21: 1 g via INTRAVENOUS
  Filled 2018-02-21: qty 10

## 2018-02-21 MED ORDER — ADULT MULTIVITAMIN W/MINERALS CH
1.0000 | ORAL_TABLET | Freq: Every day | ORAL | Status: DC
  Administered 2018-02-22: 11:00:00 1 via ORAL
  Filled 2018-02-21: qty 1

## 2018-02-21 MED ORDER — LISINOPRIL-HYDROCHLOROTHIAZIDE 20-12.5 MG PO TABS: 0.5000 | ORAL_TABLET | Freq: Every day | ORAL | Status: DC

## 2018-02-21 MED ORDER — MORPHINE SULFATE (PF) 2 MG/ML IV SOLN
2.0000 mg | INTRAVENOUS | Status: DC | PRN
  Administered 2018-02-21: 2 mg via INTRAVENOUS

## 2018-02-21 MED ORDER — HYDROCODONE-ACETAMINOPHEN 5-325 MG PO TABS
1.0000 | ORAL_TABLET | ORAL | Status: DC | PRN
  Administered 2018-02-21: 23:00:00 1 via ORAL
  Filled 2018-02-21: qty 1

## 2018-02-21 MED ORDER — B COMPLEX VITAMINS PO CAPS: 1.0000 | ORAL_CAPSULE | Freq: Every day | ORAL | Status: DC

## 2018-02-21 MED ORDER — DOCUSATE SODIUM 100 MG PO CAPS
100.0000 mg | ORAL_CAPSULE | Freq: Two times a day (BID) | ORAL | Status: DC
  Administered 2018-02-21 – 2018-02-22 (×3): 100 mg via ORAL
  Filled 2018-02-21 (×3): qty 1

## 2018-02-21 MED ORDER — ACETAMINOPHEN 325 MG PO TABS: 650.0000 mg | ORAL_TABLET | Freq: Four times a day (QID) | ORAL | Status: DC | PRN

## 2018-02-21 MED ORDER — BISACODYL 5 MG PO TBEC
5.0000 mg | DELAYED_RELEASE_TABLET | Freq: Every day | ORAL | Status: DC | PRN
  Filled 2018-02-21: qty 1

## 2018-02-21 MED ORDER — ACETAMINOPHEN 650 MG RE SUPP: 650.0000 mg | Freq: Four times a day (QID) | RECTAL | Status: DC | PRN

## 2018-02-21 MED ORDER — VITAMIN D (ERGOCALCIFEROL) 1.25 MG (50000 UNIT) PO CAPS: 50000.0000 [IU] | ORAL_CAPSULE | ORAL | Status: DC

## 2018-02-21 MED ORDER — LORATADINE 10 MG PO TABS
10.0000 mg | ORAL_TABLET | Freq: Every day | ORAL | Status: DC
  Administered 2018-02-22: 11:00:00 10 mg via ORAL
  Filled 2018-02-21: qty 1

## 2018-02-21 MED ORDER — HYDROCHLOROTHIAZIDE 10 MG/ML ORAL SUSPENSION: 6.2500 mg | Freq: Every day | ORAL | Status: DC

## 2018-02-21 MED ORDER — B COMPLEX-C PO TABS
1.0000 | ORAL_TABLET | Freq: Every day | ORAL | Status: DC
  Administered 2018-02-22: 11:00:00 1 via ORAL
  Filled 2018-02-21 (×2): qty 1

## 2018-02-21 MED ORDER — IPRATROPIUM-ALBUTEROL 0.5-2.5 (3) MG/3ML IN SOLN
3.0000 mL | Freq: Four times a day (QID) | RESPIRATORY_TRACT | Status: DC
  Administered 2018-02-21: 3 mL via RESPIRATORY_TRACT
  Filled 2018-02-21: qty 3

## 2018-02-21 MED ORDER — SODIUM CHLORIDE 0.9 % IV SOLN
500.0000 mg | INTRAVENOUS | Status: DC
  Administered 2018-02-21: 500 mg via INTRAVENOUS
  Filled 2018-02-21 (×2): qty 500

## 2018-02-21 MED ORDER — IPRATROPIUM-ALBUTEROL 0.5-2.5 (3) MG/3ML IN SOLN: 3.0000 mL | Freq: Four times a day (QID) | RESPIRATORY_TRACT | Status: DC | PRN

## 2018-02-21 MED ORDER — IOPAMIDOL (ISOVUE-370) INJECTION 76%
75.0000 mL | Freq: Once | INTRAVENOUS | Status: AC | PRN
  Administered 2018-02-21: 75 mL via INTRAVENOUS

## 2018-02-21 NOTE — Progress Notes (Signed)
Chaplain responded to an OR for AD. Chaplain delivered  The AD with Pt and spouse at the bedside. Pt said he understood and spouse said she is aware of the paperwork. Care team was in the room. Chaplain asked them to page 5/30 when they are ready.    02/21/18 2016  Clinical Encounter Type  Visited With Patient and family together  Visit Type Initial  Referral From Nurse  Spiritual Encounters  Spiritual Needs Brochure  Advance Directives (For Healthcare)  Does Patient Have a Medical Advance Directive? No  Would patient like information on creating a medical advance directive? Yes (Inpatient - patient requests chaplain consult to create a medical advance directive)  [place] Directives  Does Patient Have a Mental Health Advance Directive? No  Would patient like information on creating a mental health advance directive? No - Patient declined

## 2018-02-21 NOTE — ED Triage Notes (Signed)
Pt alert, oriented, in wheelchair. States Sunday was running and began having SOB and CP. Went to [facility] today. Was told R sided pneumo. States xray was "pretty whited out" unsure if total collapse or partial. No distress in triage.

## 2018-02-21 NOTE — H&P (Addendum)
[facility] NAME: Ruben Eaton    MR#:  [id number]  DATE OF BIRTH:  02-19-1971  DATE OF ADMISSION:  02/21/2018  PRIMARY CARE PHYSICIAN: Dion Body, MD   REQUESTING/REFERRING PHYSICIAN: [email or web address]  CHIEF COMPLAINT:   Chief Complaint  Patient presents with  . Shortness of Breath    R sided Pneumothorax    HISTORY OF PRESENT ILLNESS:  Ruben Eaton  is a 47 y.o. male with a known history of hypertension previous stroke came in for shortness of breath is going on for last 3 to 5 days.  Patient usually runs 3 miles every day for 3 times a week but recently lost 3 5 3  to 5 days patient not able to do and feeling exhausted and short of breath.  Initially treated for bronchitis, because of continued worsening of shortness of breath, right-sided pleuritic chest pain had x-ray with PCP which showed right pleural effusion and patient was sent here.  No fever.  Quit smoking 10 years ago. PAST MEDICAL HISTORY:   Past Medical History:  Diagnosis Date  . 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 ED (erectile dysfunction)   . Hx of insomnia   . Hypertension   . OSA on CPAP   . PFO (patent foramen ovale)    a. s/p closure 09/2016 by Dr. [name].    PAST SURGICAL HISTOIRY:   Past Surgical History:  Procedure Laterality Date  . APPENDECTOMY  1980  . EYE SURGERY    . PATENT FORAMEN OVALE CLOSURE  10/07/2016  . PATENT FORAMEN OVALE CLOSURE N/A 10/07/2016   Procedure: Patent Forament Ovale(PFO) Closure;  Surgeon: [name], MD;  Location: [facility];  Service: Cardiovascular;  Laterality: N/A;  . STRABISMUS SURGERY Left 1984  . TEE WITHOUT CARDIOVERSION N/A 09/08/2016   Procedure: TRANSESOPHAGEAL ECHOCARDIOGRAM (TEE);  Surgeon: [name], MD;  Location: [facility];  Service: Cardiovascular;  Laterality: N/A;  . VITRECTOMY Right 2013    SOCIAL HISTORY:   Social  History   Tobacco Use  . Smoking status: Former Smoker    Packs/day: 0.50    Years: 10.00    Pack years: 5.00    Last attempt to quit: 2011    Years since quitting: 8.4  . Smokeless tobacco: Never Used  Substance Use Topics  . Alcohol use: Yes    Alcohol/week: 1.8 oz    Types: 3 Cans of beer per week    FAMILY HISTORY:  History reviewed. No pertinent family history.  DRUG ALLERGIES:  No Known Allergies  REVIEW OF SYSTEMS:  CONSTITUTIONAL: No fever, fatigue or weakness.  EYES: No blurred or double vision.  EARS, NOSE, AND THROAT: No tinnitus or ear pain.  RESPIRATORY: cough, shortness of breath, right-sided chest pain  cARDIOVASCULAR: No chest pain, orthopnea, edema.  GASTROINTESTINAL: No nausea, vomiting, diarrhea or abdominal pain.  GENITOURINARY: No dysuria, hematuria.  ENDOCRINE: No polyuria, nocturia,  HEMATOLOGY: No anemia, easy bruising or bleeding SKIN: No rash or lesion. MUSCULOSKELETAL: No joint pain or arthritis.   NEUROLOGIC: No tingling, numbness, weakness.  PSYCHIATRY: No anxiety or depression.   MEDICATIONS AT HOME:   Prior to Admission medications   Medication Sig Start Date End Date Taking? Authorizing Provider  aspirin EC 81 MG EC tablet Take 1 tablet (81 mg total) by mouth daily. 09/08/16  Yes Mody, Ulice Bold, MD  lisinopril-hydrochlorothiazide (PRINZIDE,ZESTORETIC) 20-12.5 MG tablet Take 0.5 tablets by mouth  daily.  08/24/15  Yes [provider]  Multiple Vitamin (MULTIVITAMIN WITH MINERALS) TABS tablet Take 1 tablet by mouth daily.   Yes [provider]  tadalafil (CIALIS) 5 MG tablet Take 5 mg by mouth daily as needed for erectile dysfunction.   Yes [provider]  zolpidem (AMBIEN) 10 MG tablet Take 10 mg by mouth at bedtime as needed for sleep.  12/10/15  Yes [provider]  atorvastatin (LIPITOR) 40 MG tablet Take 1 tablet (40 mg total) by mouth daily at 6 PM. 09/08/16   Bettey Costa, MD  b complex vitamins capsule  Take 1 capsule by mouth daily.     [provider]  Diclofenac Sodium 2 % SOLN Apply 1 pump twice daily as needed. Patient taking differently: Apply 1 application topically 2 (two) times daily as needed (pain). Apply 1 pump twice daily as needed. 02/01/16   Lyndal Pulley, DO  loratadine (CLARITIN) 10 MG tablet Take 10 mg by mouth daily.     [provider]  triamcinolone (NASACORT) 55 MCG/ACT AERO nasal inhaler Place 2 sprays into the nose daily as needed (congestion).     [provider]  Vitamin D, Ergocalciferol, (DRISDOL) 50000 units CAPS capsule Take 1 capsule (50,000 Units total) by mouth every 7 (seven) days. Patient taking differently: Take 50,000 Units by mouth every 7 (seven) days. Mondays 05/05/16   Lyndal Pulley, DO      VITAL SIGNS:  Blood pressure 132/72, pulse (!) 43, temperature 98.5 F (36.9 C), temperature source Oral, resp. rate (!) 32, height 5\' 6"  (1.676 m), weight 74.8 kg (165 lb), SpO2 96 %.  PHYSICAL EXAMINATION:  GENERAL:  47 y.o.-year-old patient lying in the bed with no acute distress.  EYES: Pupils equal, round, reactive to light and accommodation. No scleral icterus. Extraocular muscles intact.  HEENT: Head atraumatic, normocephalic. Oropharynx and nasopharynx clear.  NECK:  Supple, no jugular venous distention. No thyroid enlargement, no tenderness.  LUNGS: Diminished breath sounds on the right side, clear to auscultation on the left.  CARDIOVASCULAR: S1, S2 normal. No murmurs, rubs, or gallops.  ABDOMEN: Soft, nontender, nondistended. Bowel sounds present. No organomegaly or mass.  EXTREMITIES: No pedal edema, cyanosis, or clubbing.  NEUROLOGIC: Cranial nerves II through XII are intact. Muscle strength 5/5 in all extremities. Sensation intact. Gait not checked.  PSYCHIATRIC: The patient is alert and oriented x 3.  SKIN: No obvious rash, lesion, or ulcer.   LABORATORY PANEL:   CBC Recent Labs  Lab 02/21/18 1602  WBC 9.9  HGB  14.5  HCT 41.8  PLT 307   ------------------------------------------------------------------------------------------------------------------  Chemistries  Recent Labs  Lab 02/21/18 1602  NA 127*  K 3.7  CL 92*  CO2 25  GLUCOSE 125*  BUN 13  CREATININE 0.89  CALCIUM 8.7*   ------------------------------------------------------------------------------------------------------------------  Cardiac Enzymes Recent Labs  Lab 02/21/18 1602  TROPONINI <0.03   ------------------------------------------------------------------------------------------------------------------  RADIOLOGY:  Dg Chest 2 View  Result Date: 02/21/2018 CLINICAL DATA:  Shortness of breath and chest pain beginning 3 days ago. EXAM: CHEST - 2 VIEW COMPARISON:  07/21/2006 FINDINGS: Left hemithorax is clear. ASD closure device overlies the heart. There is a large right effusion with near complete collapse of the right lung. Partial aeration of the right upper lobe. Mediastinum bows towards the left. No significant bone finding. IMPRESSION: Large right effusion with near complete collapse of the right lung. Partial aeration of the right upper lobe. Mediastinal shift towards the left. Left chest  otherwise clear. ASD closure device. Electronically Signed   By: Nelson Chimes M.D.   On: 02/21/2018 16:20    EKG:   Orders placed or performed during the hospital encounter of 02/21/18  . EKG 12-Lead  . EKG 12-Lead  . ED EKG within 10 minutes  . ED EKG within 10 minutes    IMPRESSION AND PLAN:  47 year old male patient with history of hypertension, previous stroke found to have PFO came in because of worsening shortness of breath for the past 3 to 5 days. 1.  Acute right pleural effusion, unclear origin at this time.  CT chest with contrast ordered, patient went for that.  Also get US guided thoracocentesis for symptom relief.  Patient is not hypoxic, hemodynamically stable but he is short of breath even with minimal  exertion and also has right pleuritic chest pain. History of previous stroke without any residual weakness, found to have PFO. 3.  Essential hypertension: Controlled 4.  Hyponatremia likely SIADH.  Patient' does not look dehydrated. 4.  Asymptomatic sinus bradycardia likely secondary to regular exercise. 5.  Continue empiric antibiotics, bronchodilators.    All the records are reviewed and case discussed with ED provider. Management plans discussed with the patient, family and they are in agreement.  CODE STATUS: full  TOTAL TIME TAKING CARE OF THIS PATIENT:55 min.    [name] M.D on 02/21/2018 at 5:08 PM  Between 7am to 6pm - Pager - [phone]  After 6pm go to [email or web address] - password [email or web address] [facility]  Office  [phone]  CC: Primary care physician; Dion Body, MD  Note: This dictation was prepared with Dragon dictation along with smaller phrase technology. Any transcriptional errors that result from this process are unintentional.

## 2018-02-21 NOTE — Progress Notes (Addendum)
Chest results reviewed, which showed a large right pleural effusion, multiple enhancing pleural-based nodules demonstrated throughout the right hemithorax, patient also has multiple mediastinal lymph nodes concerning for nodal metastasis, multiple nodules in the left lung concerning for pulmonary metastasis.  Request pulmonary, oncology consult, results discussed with patient's wife.

## 2018-02-21 NOTE — ED Provider Notes (Signed)
[facility] Emergency Department Provider Note   ____________________________________________    I have reviewed the triage vital signs and the nursing notes.   HISTORY  Chief Complaint Shortness of breath    HPI Ruben Eaton is a 47 y.o. male who presents with complaints of shortness of breath.  Patient notes that over the last week he has had worsening shortness of breath.  Typically he is able to run 3 miles but he has difficulty running half a block now.  He went to urgent care earlier in the week and was diagnosed with bronchitis.  Felt worse and went to his PCP today who obtained an x-ray which showed large pleural effusion.  Sent to the emergency department.  Denies fevers or chills.  No recent travel.  No chest pain.  Quit smoking 10 years ago. there is no weight loss   Past Medical History:  Diagnosis Date  . 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 ED (erectile dysfunction)   . Hx of insomnia   . Hypertension   . OSA on CPAP   . PFO (patent foramen ovale)    a. s/p closure 09/2016 by Dr. [name].    Patient Active Problem List   Diagnosis Date Noted  . History of stroke 10/08/2016  . PVC's (premature ventricular contractions) 10/08/2016  . PFO (patent foramen ovale) 10/07/2016  . TIA (transient ischemic attack) 09/07/2016  . CVA (cerebral vascular accident) ([place]) 09/07/2016  . 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 Essential (primary) hypertension 02/01/2016  . H/O disease 02/01/2016  . Quadriceps tendinitis 02/01/2016    Past Surgical History:  Procedure Laterality Date  . APPENDECTOMY  1980  . EYE SURGERY    . PATENT FORAMEN OVALE CLOSURE  10/07/2016  . PATENT FORAMEN OVALE CLOSURE N/A 10/07/2016   Procedure: Patent Forament [place](PFO) Closure;  Surgeon: [name], MD;  Location: [facility];  Service: Cardiovascular;  Laterality: N/A;  . STRABISMUS  SURGERY Left 1984  . TEE WITHOUT CARDIOVERSION N/A 09/08/2016   Procedure: TRANSESOPHAGEAL ECHOCARDIOGRAM (TEE);  Surgeon: [name], MD;  Location: [facility];  Service: Cardiovascular;  Laterality: N/A;  . VITRECTOMY Right 2013    Prior to Admission medications   Medication Sig Start Date End Date Taking? Authorizing Provider  aspirin EC 81 MG EC tablet Take 1 tablet (81 mg total) by mouth daily. 09/08/16   Bettey Costa, MD  atorvastatin (LIPITOR) 40 MG tablet Take 1 tablet (40 mg total) by mouth daily at 6 PM. 09/08/16   Bettey Costa, MD  b complex vitamins capsule Take 1 capsule by mouth daily.     [provider]  desonide (DESOWEN) 0.05 % cream Apply 1 application topically daily as needed (rash).    [provider]  Diclofenac Sodium 2 % SOLN Apply 1 pump twice daily as needed. Patient taking differently: Apply 1 application topically 2 (two) times daily as needed (pain). Apply 1 pump twice daily as needed. 02/01/16   Lyndal Pulley, DO  lisinopril-hydrochlorothiazide (PRINZIDE,ZESTORETIC) 20-12.5 MG tablet Take 0.5 tablets by mouth daily.  08/24/15   [provider]  loratadine (CLARITIN) 10 MG tablet Take 10 mg by mouth daily.     [provider]  nitroGLYCERIN (NITRODUR - DOSED IN MG/24 HR) 0.2 mg/hr patch Place 0.2 mg onto the skin daily as needed (pain).    [provider]  tadalafil (CIALIS) 5 MG tablet  Take 5 mg by mouth daily as needed for erectile dysfunction.    [provider]  triamcinolone (NASACORT) 55 MCG/ACT AERO nasal inhaler Place 2 sprays into the nose daily as needed (congestion).     [provider]  Vitamin D, Ergocalciferol, (DRISDOL) 50000 units CAPS capsule Take 1 capsule (50,000 Units total) by mouth every 7 (seven) days. Patient taking differently: Take 50,000 Units by mouth every 7 (seven) days. Mondays 05/05/16   Lyndal Pulley, DO  zolpidem (AMBIEN) 10 MG tablet Take 10 mg by mouth at bedtime  as needed for sleep.  12/10/15   [provider]     Allergies Patient has no known allergies.  History reviewed. No pertinent family history.  Social History Social History   Tobacco Use  . Smoking status: Former Smoker    Packs/day: 0.50    Years: 10.00    Pack years: 5.00    Last attempt to quit: 2011    Years since quitting: 8.4  . Smokeless tobacco: Never Used  Substance Use Topics  . Alcohol use: Yes    Alcohol/week: 1.8 oz    Types: 3 Cans of beer per week  . Drug use: No    Review of Systems  Constitutional: No fever/chills, no weight loss Eyes: No visual changes.  ENT: No sore throat. Cardiovascular: Denies chest pain. Respiratory: As above Gastrointestinal: No abdominal pain.   Genitourinary: Negative for dysuria. Musculoskeletal: Negative for back pain. Skin: Negative for rash. Neurological: Negative for headaches    ____________________________________________   PHYSICAL EXAM:  VITAL SIGNS: ED Triage Vitals  Enc Vitals Group     BP 02/21/18 1559 (!) 142/65     Pulse Rate 02/21/18 1559 (!) 47     Resp 02/21/18 1559 18     Temp 02/21/18 1559 98.5 F (36.9 C)     Temp Source 02/21/18 1559 Oral     SpO2 02/21/18 1559 95 %     Weight 02/21/18 1600 74.8 kg (165 lb)     Height 02/21/18 1600 1.676 m (5\' 6" )     Head Circumference --      Peak Flow --      Pain Score 02/21/18 1600 4     Pain Loc --      Pain Edu? --      Excl. in [place]? --     Constitutional: Alert and oriented. No acute distress. Pleasant and interactive Eyes: Conjunctivae are normal.   Nose: No congestion/rhinnorhea. Mouth/Throat: Mucous membranes are moist.    Cardiovascular: Normal rate, regular rhythm.  Good peripheral circulation. Respiratory: Normal respiratory effort.  No retractions. Gastrointestinal: Soft and nontender. No distention.  Musculoskeletal: No lower extremity tenderness nor edema.  Warm and well perfused Neurologic:  Normal speech and language.  No gross focal neurologic deficits are appreciated.  Skin:  Skin is warm, dry and intact. No rash noted. Psychiatric: Mood and affect are normal. Speech and behavior are normal.  ____________________________________________   LABS (all labs ordered are listed, but only abnormal results are displayed)  Labs Reviewed  BASIC METABOLIC PANEL - Abnormal; Notable for the following components:      Result Value   Sodium 127 (*)    Chloride 92 (*)    Glucose, Bld 125 (*)    Calcium 8.7 (*)    All other components within normal limits  CBC  TROPONIN I   ____________________________________________  EKG  ED ECG REPORT I, [name], the attending physician, personally viewed and  interpreted this ECG.  Date: 02/21/2018  Rhythm: Tachycardia QRS Axis: normal Intervals: normal ST/T Wave abnormalities: normal Narrative Interpretation: no evidence of acute ischemia  ____________________________________________  RADIOLOGY  Chest x-ray shows large right effusion with near complete collapse of the right lung ____________________________________________   PROCEDURES  Procedure(s) performed: No  Procedures   Critical Care performed: No ____________________________________________   INITIAL IMPRESSION / ASSESSMENT AND PLAN / ED COURSE  Pertinent labs & imaging results that were available during my care of the patient were reviewed by me and considered in my medical decision making (see chart for details).  Patient presents with shortness of breath with large right pleural effusion of unknown etiology, will admit to the hospital service for further management    ____________________________________________   FINAL CLINICAL IMPRESSION(S) / ED DIAGNOSES  Final diagnoses:  Pleural effusion on right  SOB (shortness of breath)        Note:  This document was prepared using Dragon voice recognition software and may include unintentional dictation errors.      [name], MD 02/21/18 [phone]

## 2018-02-22 ENCOUNTER — Inpatient Hospital Stay: Payer: No Typology Code available for payment source

## 2018-02-22 DIAGNOSIS — R918 Other nonspecific abnormal finding of lung field: Secondary | ICD-10-CM

## 2018-02-22 DIAGNOSIS — Z87891 Personal history of nicotine dependence: Secondary | ICD-10-CM

## 2018-02-22 DIAGNOSIS — J90 Pleural effusion, not elsewhere classified: Secondary | ICD-10-CM

## 2018-02-22 DIAGNOSIS — G4733 Obstructive sleep apnea (adult) (pediatric): Secondary | ICD-10-CM

## 2018-02-22 DIAGNOSIS — R590 Localized enlarged lymph nodes: Secondary | ICD-10-CM

## 2018-02-22 DIAGNOSIS — Z79899 Other long term (current) drug therapy: Secondary | ICD-10-CM

## 2018-02-22 DIAGNOSIS — Z9989 Dependence on other enabling machines and devices: Secondary | ICD-10-CM

## 2018-02-22 DIAGNOSIS — I10 Essential (primary) hypertension: Secondary | ICD-10-CM

## 2018-02-22 LAB — BASIC METABOLIC PANEL
Anion gap: 10 (ref 5–15)
BUN: 15 mg/dL (ref 6–20)
CALCIUM: 8.7 mg/dL — AB (ref 8.9–10.3)
CO2: 25 mmol/L (ref 22–32)
CREATININE: 0.94 mg/dL (ref 0.61–1.24)
Chloride: 93 mmol/L — ABNORMAL LOW (ref 101–111)
Glucose, Bld: 119 mg/dL — ABNORMAL HIGH (ref 65–99)
Potassium: 4.6 mmol/L (ref 3.5–5.1)
Sodium: 128 mmol/L — ABNORMAL LOW (ref 135–145)

## 2018-02-22 LAB — CBC
HCT: 38.9 % — ABNORMAL LOW (ref 40.0–52.0)
Hemoglobin: 13.7 g/dL (ref 13.0–18.0)
MCH: 33.3 pg (ref 26.0–34.0)
MCHC: 35.3 g/dL (ref 32.0–36.0)
MCV: 94.3 fL (ref 80.0–100.0)
PLATELETS: 290 10*3/uL (ref 150–440)
RBC: 4.12 MIL/uL — ABNORMAL LOW (ref 4.40–5.90)
RDW: 13.4 % (ref 11.5–14.5)
WBC: 8.9 10*3/uL (ref 3.8–10.6)

## 2018-02-22 LAB — BODY FLUID CELL COUNT WITH DIFFERENTIAL
Lymphs, Fluid: 72 %
MONOCYTE-MACROPHAGE-SEROUS FLUID: 9 %
Neutrophil Count, Fluid: 19 %
Total Nucleated Cell Count, Fluid: 497 cu mm

## 2018-02-22 LAB — AMYLASE, PLEURAL OR PERITONEAL FLUID: AMYLASE FL: 15 U/L

## 2018-02-22 LAB — GLUCOSE, CAPILLARY: GLUCOSE-CAPILLARY: 98 mg/dL (ref 65–99)

## 2018-02-22 MED ORDER — ENOXAPARIN SODIUM 40 MG/0.4ML ~~LOC~~ SOLN: 40.0000 mg | SUBCUTANEOUS | Status: DC

## 2018-02-22 MED ORDER — MORPHINE SULFATE (PF) 2 MG/ML IV SOLN
1.0000 mg | INTRAVENOUS | Status: DC | PRN
  Administered 2018-02-22 (×2): 1 mg via INTRAVENOUS
  Filled 2018-02-22 (×2): qty 1

## 2018-02-22 MED ORDER — MORPHINE SULFATE (PF) 2 MG/ML IV SOLN: 2.0000 mg | INTRAVENOUS | Status: DC | PRN

## 2018-02-22 MED ORDER — HYDROCODONE-ACETAMINOPHEN 5-325 MG PO TABS
1.0000 | ORAL_TABLET | ORAL | Status: DC | PRN
  Administered 2018-02-22: 1 via ORAL
  Filled 2018-02-22: qty 1

## 2018-02-22 MED ORDER — MORPHINE SULFATE (PF) 2 MG/ML IV SOLN
1.0000 mg | INTRAVENOUS | Status: DC | PRN
  Administered 2018-02-22: 04:00:00 1 mg via INTRAVENOUS
  Filled 2018-02-22: qty 1

## 2018-02-22 NOTE — H&P (View-Only) (Signed)
Patient ID: [name], male   DOB: [birth year], 47 y.o.   MRN: [id number]  Chief Complaint  Patient presents with  . Shortness of Breath    R sided Pneumothorax    Referred By [name] Reason for Referral probable malignant pleural effusion  HPI Location, Quality, Duration, Severity, Timing, Context, Modifying Factors, Associated Signs and Symptoms.  Ruben Eaton is a 47 y.o. male.  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  He was in his usual state of health except over the last few weeks when he has been noticing increasing shortness of breath and some right-sided chest pain.  He usually runs 3-4 times a week.  He also lifts weights and is otherwise in excellent physical health.  He was initially treated for bronchitis but ultimately became so short of breath that he presented to the emergency department where a chest x-ray showed a large right-sided pleural effusion.  A subsequent chest CT revealed a probable mass in the right midlung zone with a large amount of free intrapleural fluid as well as pleural metastases along the posterior aspects of the hemithorax.  The patient underwent ultrasound-guided drainage of this fluid and states that he felt significantly better.  He states he is no longer short of breath and is about 95% of his baseline.  He was seen by [name] in oncology.  [name] is concerned that there may be insufficient tissue to make a diagnosis and all of the additional studies that are needed so he has asked for a thoracoscopic pleural biopsy with talc pleurodesis and possible Pleurx catheter insertion.  At the same time it also like a Port-A-Cath placed.  I was asked to see the patient to discuss these options.  I did discuss this with [name] and I also discussed his care with [name] and pulmonary medicine.   Past Medical History:  Diagnosis Date  . 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 ED (erectile dysfunction)   . Hx of insomnia   . Hypertension   . OSA on CPAP   . PFO (patent foramen ovale)    a. s/p closure 09/2016 by Dr. [name].    Past Surgical History:  Procedure Laterality Date  . APPENDECTOMY  1980  . EYE SURGERY    . PATENT FORAMEN OVALE CLOSURE  10/07/2016  . PATENT FORAMEN OVALE CLOSURE N/A 10/07/2016   Procedure: Patent Forament Ovale(PFO) Closure;  Surgeon: [name], MD;  Location: [place] [facility];  Service: Cardiovascular;  Laterality: N/A;  . STRABISMUS SURGERY Left 1984  . TEE WITHOUT CARDIOVERSION N/A 09/08/2016   Procedure: TRANSESOPHAGEAL ECHOCARDIOGRAM (TEE);  Surgeon: [name], MD;  Location: [facility];  Service: Cardiovascular;  Laterality: N/A;  . VITRECTOMY Right 2013    History reviewed. No pertinent family history.  Social History Social History   Tobacco Use  . Smoking status: Former Smoker    Packs/day: 0.50    Years: 10.00    Pack years: 5.00    Last attempt to quit: 2011    Years since quitting: 8.4  . Smokeless tobacco: Never Used  Substance Use Topics  . Alcohol use: Yes    Alcohol/week: 1.8 oz    Types: 3 Cans of beer per week  . Drug use: No    No Known Allergies  Current Facility-Administered Medications  Medication Dose Route Frequency Provider Last Rate Last Dose  .  acetaminophen (TYLENOL) tablet 650 mg  650 mg Oral Q6H PRN [name], MD       Or  . acetaminophen (TYLENOL) suppository 650 mg  650 mg Rectal Q6H PRN [name], MD      . atorvastatin (LIPITOR) tablet 40 mg  40 mg Oral q1800 [name], MD   40 mg at 02/21/18 1808  . B-complex with vitamin C tablet 1 tablet  1 tablet Oral Daily [name], MD   1 tablet at 02/22/18 1105  . bisacodyl (DULCOLAX) EC tablet 5 mg  5 mg Oral Daily PRN [name], MD      . docusate sodium (COLACE) capsule 100 mg  100 mg Oral BID [name], MD   100 mg at 02/22/18  1105  . ipratropium-albuterol (DUONEB) 0.5-2.5 (3) MG/3ML nebulizer solution 3 mL  3 mL Nebulization Q6H PRN [name], MD      . lisinopril (PRINIVIL,ZESTRIL) tablet 10 mg  10 mg Oral Daily [name], MD   10 mg at 02/22/18 1105  . loratadine (CLARITIN) tablet 10 mg  10 mg Oral Daily [name], MD   10 mg at 02/22/18 1105  . LORazepam (ATIVAN) tablet 1 mg  1 mg Oral QHS PRN [name], MD   1 mg at 02/21/18 2236  . morphine 2 MG/ML injection 1 mg  1 mg Intravenous Q2H PRN [name], MD   1 mg at 02/22/18 1610  . multivitamin with minerals tablet 1 tablet  1 tablet Oral Daily [name], MD   1 tablet at 02/22/18 1106  . ondansetron (ZOFRAN) tablet 4 mg  4 mg Oral Q6H PRN [name], MD       Or  . ondansetron (ZOFRAN) injection 4 mg  4 mg Intravenous Q6H PRN [name], MD      . Derrill Memo ON 02/26/2018] Vitamin D (Ergocalciferol) (DRISDOL) capsule 50,000 Units  50,000 Units Oral Q7 days [name], MD          Review of Systems A complete review of systems was asked and was negative except for the following positive findings some weight loss, increasing shortness of breath and chest pain.  Blood pressure 121/61, pulse 90, temperature 98 F (36.7 C), temperature source Oral, resp. rate 20, height 5\' 6"  (1.676 m), weight 175 lb 0.7 oz (79.4 kg), SpO2 96 %.  Physical Exam CONSTITUTIONAL:  Pleasant, well-developed, well-nourished, and in no acute distress. EYES: Pupils equal and reactive to light, Sclera non-icteric EARS, NOSE, MOUTH AND THROAT:  The oropharynx was clear.  Dentition is good repair.  Oral mucosa pink and moist. LYMPH NODES:  Lymph nodes in the neck and axillae were normal RESPIRATORY:  Lungs were clear on the left but were markedly diminished on the right..  Normal respiratory effort without pathologic use of accessory muscles of respiration CARDIOVASCULAR: Heart was regular without murmurs.  There were  no carotid bruits. GI: The abdomen was soft, nontender, and nondistended. There were no palpable masses. There was no hepatosplenomegaly. There were normal bowel sounds in all quadrants. GU:  Rectal deferred.   MUSCULOSKELETAL:  Normal muscle strength and tone.  No clubbing or cyanosis.   SKIN:  There were no pathologic skin lesions.  There were no nodules on palpation. NEUROLOGIC:  Sensation is normal.  Cranial nerves are grossly intact. PSYCH:  Oriented to person, place and time.  Mood and affect are normal.  Data Reviewed Chest x-rays and CT scans  I have personally reviewed the patient's  imaging, laboratory findings and medical records.    Assessment    I have independently reviewed the patient's x-rays.  I discussed his care with our pulmonologist and oncologist.  I do believe that he most likely has a malignant pleural effusion.  I had a long discussion today with the patient and his family.  We reviewed the options for management of his presumed malignant pleural effusion.  I discussed with him the need for additional material for all the studies are required.  I reviewed with him in detail the indications and risks of thoracoscopy possible thoracotomy with pleural biopsy talc pleurodesis and possible Pleurx catheter insertion.  We also discussed the role of a Port-A-Cath and placement of it.    Plan    After extensive discussion they would like to proceed.  We will plan to perform this tomorrow.  We will hold his Lovenox tonight.      Nestor Lewandowsky, MD 02/22/2018, 5:26 PM   Patient ID: Ruben Eaton, male   DOB: 28-Apr-1971, 47 y.o.   MRN: [id number]

## 2018-02-22 NOTE — Consult Note (Signed)
[place] CONSULT NOTE  Patient Care Team: Dion Body, MD as PCP - General (Family Medicine)  CHIEF COMPLAINTS/PURPOSE OF CONSULTATION:  Right-sided pleural effusion  HISTORY OF PRESENTING ILLNESS:  Ruben Eaton 47 y.o.  male with a history of light smoking quit 8 years ago-noted to have worsening shortness of breath with cough over the last few weeks.   Patient was initially evaluated at urgent care; final chest x-ray was done that showed right-sided pleural effusion for which was admitted to the hospital for further work-up.  CT scan showed large right-sided pleural effusion-up to a 7 cm anterior pleural-based mass; multiple pleural-based mass and also mediastinal adenopathy; with subcentimeter nodules on the contralateral left lung.   Patient had thoracentesis done; drained about 2 L; cytology pending.  Denies any significant blood loss but denies any headaches.  ROS: A complete 10 point review of system is done which is negative except mentioned above in history of present illness  MEDICAL HISTORY:  Past Medical History:  Diagnosis Date  . 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 ED (erectile dysfunction)   . Hx of insomnia   . Hypertension   . OSA on CPAP   . PFO (patent foramen ovale)    a. s/p closure 09/2016 by Dr. [name].    SURGICAL HISTORY: Past Surgical History:  Procedure Laterality Date  . APPENDECTOMY  1980  . EYE SURGERY    . PATENT FORAMEN OVALE CLOSURE  10/07/2016  . PATENT FORAMEN OVALE CLOSURE N/A 10/07/2016   Procedure: Patent Forament [place](PFO) Closure;  Surgeon: [name], MD;  Location: [place] [facility];  Service: Cardiovascular;  Laterality: N/A;  . STRABISMUS SURGERY Left 1984  . TEE WITHOUT CARDIOVERSION N/A 09/08/2016   Procedure: TRANSESOPHAGEAL ECHOCARDIOGRAM (TEE);  Surgeon: [name], MD;  Location: [facility];  Service: Cardiovascular;  Laterality: N/A;  .  VITRECTOMY Right 2013    SOCIAL HISTORY: 1/2 ppd x15 yeats quit 2011; patient lives in [place] with family.  No biologic children.  Occasional alcohol.  Works as Software engineer; at [employer] History   Socioeconomic History  . Marital status: Married    Spouse name: Not on file  . Number of children: Not on file  . Years of education: Not on file  . Highest education level: Not on file  Occupational History  . Occupation: Software engineer  Social Needs  . Financial resource strain: Not on file  . Food insecurity:    Worry: Not on file    Inability: Not on file  . Transportation needs:    Medical: Not on file    Non-medical: Not on file  Tobacco Use  . Smoking status: Former Smoker    Packs/day: 0.50    Years: 10.00    Pack years: 5.00    Last attempt to quit: 2011    Years since quitting: 8.4  . Smokeless tobacco: Never Used  Substance and Sexual Activity  . Alcohol use: Yes    Alcohol/week: 1.8 oz    Types: 3 Cans of beer per week  . Drug use: No  . Sexual activity: Yes  Lifestyle  . Physical activity:    Days per week: Not on file    Minutes per session: Not on file  . Stress: Not on file  Relationships  . Social connections:    Talks on phone: Not on file    Gets together: Not on file  Attends religious service: Not on file    Active member of club or organization: Not on file    Attends meetings of clubs or organizations: Not on file    Relationship status: Not on file  . Intimate partner violence:    Fear of current or ex partner: Not on file    Emotionally abused: Not on file    Physically abused: Not on file    Forced sexual activity: Not on file  Other Topics Concern  . Not on file  Social History Narrative  . Not on file    FAMILY HISTORY:Uncle/abdominal- 9s [id number] ]  History reviewed. No pertinent family history.  ALLERGIES:  has No Known Allergies.  MEDICATIONS:  Current Facility-Administered Medications  Medication Dose Route  Frequency Provider Last Rate Last Dose  . acetaminophen (TYLENOL) tablet 650 mg  650 mg Oral Q6H PRN [name], MD       Or  . acetaminophen (TYLENOL) suppository 650 mg  650 mg Rectal Q6H PRN [name], MD      . atorvastatin (LIPITOR) tablet 40 mg  40 mg Oral q1800 [name], MD   40 mg at 02/21/18 1808  . B-complex with vitamin C tablet 1 tablet  1 tablet Oral Daily [name], MD   1 tablet at 02/22/18 1105  . bisacodyl (DULCOLAX) EC tablet 5 mg  5 mg Oral Daily PRN [name], MD      . docusate sodium (COLACE) capsule 100 mg  100 mg Oral BID [name], MD   100 mg at 02/22/18 1105  . enoxaparin (LOVENOX) injection 40 mg  40 mg Subcutaneous Q24H Salary, Montell D, MD      . ipratropium-albuterol (DUONEB) 0.5-2.5 (3) MG/3ML nebulizer solution 3 mL  3 mL Nebulization Q6H PRN [name], MD      . lisinopril (PRINIVIL,ZESTRIL) tablet 10 mg  10 mg Oral Daily [name], MD   10 mg at 02/22/18 1105  . loratadine (CLARITIN) tablet 10 mg  10 mg Oral Daily [name], MD   10 mg at 02/22/18 1105  . LORazepam (ATIVAN) tablet 1 mg  1 mg Oral QHS PRN [name], MD   1 mg at 02/21/18 2236  . morphine 2 MG/ML injection 1 mg  1 mg Intravenous Q2H PRN [name], MD   1 mg at 02/22/18 4097  . multivitamin with minerals tablet 1 tablet  1 tablet Oral Daily [name], MD   1 tablet at 02/22/18 1106  . ondansetron (ZOFRAN) tablet 4 mg  4 mg Oral Q6H PRN [name], MD       Or  . ondansetron (ZOFRAN) injection 4 mg  4 mg Intravenous Q6H PRN [name], MD      . Derrill Memo ON 02/26/2018] Vitamin D (Ergocalciferol) (DRISDOL) capsule 50,000 Units  50,000 Units Oral Q7 days [name], MD          .  PHYSICAL EXAMINATION:  Vitals:   02/22/18 1018 02/22/18 1154  BP: (!) 122/52 121/61  Pulse: 71 90  Resp: 20 20  Temp:  98 F (36.7 C)  SpO2: 96% 96%   Filed Weights    02/21/18 1600 02/21/18 2003  Weight: 165 lb (74.8 kg) 175 lb 0.7 oz (79.4 kg)    GENERAL: Well-nourished well-developed; Alert, no distress and comfortable.   Accompanied by family.  EYES: no pallor or icterus OROPHARYNX: no thrush or ulceration. NECK: supple, no masses felt LYMPH:  no palpable lymphadenopathy in the  cervical, axillary or inguinal regions LUNGS: decreased breath sounds to auscultation right compared to left and  No wheeze or crackles HEART/CVS: regular rate & rhythm and no murmurs; No lower extremity edema ABDOMEN: abdomen soft, non-tender and normal bowel sounds Musculoskeletal:no cyanosis of digits and no clubbing  PSYCH: alert & oriented x 3 with fluent speech NEURO: no focal motor/sensory deficits SKIN:  no rashes or significant lesions  LABORATORY DATA:  I have reviewed the data as listed Lab Results  Component Value Date   WBC 8.9 02/22/2018   HGB 13.7 02/22/2018   HCT 38.9 (L) 02/22/2018   MCV 94.3 02/22/2018   PLT 290 02/22/2018   Recent Labs    02/21/18 1602 02/21/18 2026 02/22/18 0543  NA 127*  --  128*  K 3.7  --  4.6  CL 92*  --  93*  CO2 25  --  25  GLUCOSE 125*  --  119*  BUN 13  --  15  CREATININE 0.89 0.89 0.94  CALCIUM 8.7*  --  8.7*  GFRNONAA >60 >60 >60  GFRAA >60 >60 >60    RADIOGRAPHIC STUDIES: I have personally reviewed the radiological images as listed and agreed with the findings in the report. Dg Chest 2 View  Result Date: 02/21/2018 CLINICAL DATA:  Shortness of breath and chest pain beginning 3 days ago. EXAM: CHEST - 2 VIEW COMPARISON:  07/21/2006 FINDINGS: Left hemithorax is clear. ASD closure device overlies the heart. There is a large right effusion with near complete collapse of the right lung. Partial aeration of the right upper lobe. Mediastinum bows towards the left. No significant bone finding. IMPRESSION: Large right effusion with near complete collapse of the right lung. Partial aeration of the right upper lobe.  Mediastinal shift towards the left. Left chest otherwise clear. ASD closure device. Electronically Signed   By: Nelson Chimes M.D.   On: 02/21/2018 16:20   Ct Chest W Contrast  Result Date: 02/21/2018 CLINICAL DATA:  Patient with shortness of breath. History of bronchitis. Large right pleural effusion. EXAM: CT CHEST WITH CONTRAST TECHNIQUE: Multidetector CT imaging of the chest was performed during intravenous contrast administration. CONTRAST:  69mL ISOVUE-370 IOPAMIDOL (ISOVUE-370) INJECTION 76% COMPARISON:  Chest radiograph 02/21/2018 FINDINGS: Cardiovascular: Normal heart size. Trace fluid superior pericardial recess. Normal caliber aorta and main pulmonary artery. Mediastinum/Nodes: Leftward mediastinal shift secondary to large right pleural effusion. Enlarged mediastinal lymph nodes are demonstrated including a 1.3 cm prevascular lymph node (image 55; series 2). There is a 0.8 cm right paratracheal lymph node (image 52; series 2). Esophagus is poorly visualized. Lungs/Pleura: Mild leftward shift of the tracheal air column. There is a 6 mm nodule within the left lower lobe (image 121; series 3). There is a 6 mm left lower lobe nodule (image 79; series 3). There is a 5 mm nodule in the lingula (image 55; series 3). There is a 3 mm nodule within the left upper lobe (image 41; series 3). There is a large right pleural effusion with near total collapse of the right lung. There is minimal aeration of the right upper lobe. Multiple enhancing predominantly pleural-based nodules are demonstrated throughout the right hemithorax. There is a more dominant pleural-based nodule demonstrated within the anterior right upper hemithorax which measures up to 7 cm. Upper Abdomen: Unremarkable. Musculoskeletal: Thoracic spine degenerative changes. No aggressive or acute appearing osseous lesions. IMPRESSION: 1. Multiple enhancing pleural-based nodules are demonstrated throughout the right hemithorax with large right pleural  effusion most concerning  for pleural-based malignancy/metastatic disease. There is a more dominant pleural-based mass within the anterior right hemithorax. 2. Multiple enlarged mediastinal lymph nodes concerning for nodal metastatic disease. 3. Multiple nodules within the left lung concerning for the possibility of pulmonary metastatic disease. 4. These results were called by telephone at the time of interpretation on 02/21/2018 at 5:58 pm. Electronically Signed   By: Lovey Newcomer M.D.   On: 02/21/2018 18:00   Dg Chest Port 1 View  Result Date: 02/22/2018 CLINICAL DATA:  Post right-sided thoracentesis EXAM: PORTABLE CHEST 1 VIEW COMPARISON:  02/21/2018; chest CT-02/21/2018 FINDINGS: Interval reduction in persistent moderate-sized right-sided effusion post thoracentesis. No pneumothorax. Slight reduction in previously noted right to left mediastinal shift. Otherwise, unchanged cardiac silhouette and mediastinal contours with persistent obscuration of the right heart border secondary to right basilar consolidative opacities. Post PFO ligation. No new focal airspace opacities. The left hemithorax remains well aerated. No acute osseus abnormalities. IMPRESSION: Interval reduction in persistent moderate-sized right-sided effusion post thoracentesis. No pneumothorax. Electronically Signed   By: Sandi Mariscal M.D.   On: 02/22/2018 10:41   US Thoracentesis Asp Pleural Space W/img Guide  Result Date: 02/22/2018 INDICATION: No known primary, now with multiple pleural based masses and symptomatic right-sided pleural effusion. Please perform ultrasound-guided thoracentesis for diagnostic and therapeutic purposes. EXAM: US THORACENTESIS ASP PLEURAL SPACE W/IMG GUIDE COMPARISON:  Chest radiograph - 02/21/2018; chest CT - 02/21/2018 MEDICATIONS: None. COMPLICATIONS: None immediate. TECHNIQUE: Informed written consent was obtained from the patient after a discussion of the risks, benefits and alternatives to treatment. A  timeout was performed prior to the initiation of the procedure. Initial ultrasound scanning demonstrates a large anechoic right-sided pleural effusion. The lower chest was prepped and draped in the usual sterile fashion. 1% lidocaine was used for local anesthesia. An ultrasound image was saved for documentation purposes. An 8 Fr Safe-T-Centesis catheter was introduced. The thoracentesis was performed. The catheter was removed and a dressing was applied. The patient tolerated the procedure well without immediate post procedural complication. The patient was escorted to have an upright chest radiograph. FINDINGS: A total of approximately 2 liters of serous, slightly blood tinged fluid was removed. Requested samples were sent to the laboratory. IMPRESSION: Successful ultrasound-guided right sided thoracentesis yielding 2 liters of pleural fluid. Electronically Signed   By: Sandi Mariscal M.D.   On: 02/22/2018 11:06    ASSESSMENT & PLAN:   #47 year old male patient with minimal smoking history is currently admitted the hospital for worsening shortness of breath cough  # Large right-sided pleural effusion-pleural-based masses-highly suspicious for malignancy-primary lung.  Cytology from thoracentesis pending.  However I would recommend Pleurx catheter placement/pleurodesis-as a suspect patient will keep on re-accumulation pleural fluid-even before systemic therapy helps control the pleural fluid re-accumulation.   #With regards to suspected lung cancer Long discussion the patient and family regarding the treatment options include chemotherapy; immunotherapy; targeted therapy.  However this would be based on availability of molecular testing.  Patient will also need further staging work-up including PET scan and also MRI brain.  #I have spoken to Dr. Faith Rogue; who kindly agrees to evaluate the patient as soon as possible.  Also discussed with Dr. Jerelyn Charles.  Thank you Dr.Salary for allowing me to participate in the  care of your pleasant patient. Please do not hesitate to contact me with questions or concerns in the interim.  # # I reviewed the blood work- with the patient in detail; also reviewed the imaging independently [as summarized above]; and with  the patient in detail.  I have informed [facility] lung navigator.   All questions were answered. The patient knows to call the clinic with any problems, questions or concerns.   Cammie Sickle, MD 02/22/2018 4:16 PM

## 2018-02-22 NOTE — Procedures (Signed)
Pre procedural Dx: Symptomatic Pleural effusion Post procedural Dx: Same  Successful US guided right sided thoracentesis yielding 2 L of blood tinged pleural fluid.   Samples sent to lab for analysis.  EBL: None  Complications: None immediate.  Ronny Bacon, MD Pager #: [phone]

## 2018-02-22 NOTE — Progress Notes (Signed)
**Note Ruben-Identified via Obfuscation** Patient ID: Ruben Eaton, male   DOB: 1971/01/29, 47 y.o.   MRN: [id number]  Chief Complaint  Patient presents with  . Shortness of Breath    R sided Pneumothorax    Referred By [name] Reason for Referral probable malignant pleural effusion  HPI Location, Quality, Duration, Severity, Timing, Context, Modifying Factors, Associated Signs and Symptoms.  Ruben Eaton is a 47 y.o. male.  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  He was in his usual state of health except over the last few weeks when he has been noticing increasing shortness of breath and some right-sided chest pain.  He usually runs 3-4 times a week.  He also lifts weights and is otherwise in excellent physical health.  He was initially treated for bronchitis but ultimately became so short of breath that he presented to the emergency department where a chest x-ray showed a large right-sided pleural effusion.  A subsequent chest CT revealed a probable mass in the right midlung zone with a large amount of free intrapleural fluid as well as pleural metastases along the posterior aspects of the hemithorax.  The patient underwent ultrasound-guided drainage of this fluid and states that he felt significantly better.  He states he is no longer short of breath and is about 95% of his baseline.  He was seen by [name] in oncology.  [name] is concerned that there may be insufficient tissue to make a diagnosis and all of the additional studies that are needed so he has asked for a thoracoscopic pleural biopsy with talc pleurodesis and possible Pleurx catheter insertion.  At the same time it also like a Port-A-Cath placed.  I was asked to see the patient to discuss these options.  I did discuss this with [name] and I also discussed his care with [name] and pulmonary medicine.   Past Medical History:  Diagnosis Date  . 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 ED (erectile dysfunction)   . Hx of insomnia   . Hypertension   . OSA on CPAP   . PFO (patent foramen ovale)    a. s/p closure 09/2016 by Dr. [name].    Past Surgical History:  Procedure Laterality Date  . APPENDECTOMY  1980  . EYE SURGERY    . PATENT FORAMEN OVALE CLOSURE  10/07/2016  . PATENT FORAMEN OVALE CLOSURE N/A 10/07/2016   Procedure: Patent Forament [place](PFO) Closure;  Surgeon: [name], MD;  Location: [place] [facility];  Service: Cardiovascular;  Laterality: N/A;  . STRABISMUS SURGERY Left 1984  . TEE WITHOUT CARDIOVERSION N/A 09/08/2016   Procedure: TRANSESOPHAGEAL ECHOCARDIOGRAM (TEE);  Surgeon: [name], MD;  Location: [facility];  Service: Cardiovascular;  Laterality: N/A;  . VITRECTOMY Right 2013    History reviewed. No pertinent family history.  Social History Social History   Tobacco Use  . Smoking status: Former Smoker    Packs/day: 0.50    Years: 10.00    Pack years: 5.00    Last attempt to quit: 2011    Years since quitting: 8.4  . Smokeless tobacco: Never Used  Substance Use Topics  . Alcohol use: Yes    Alcohol/week: 1.8 oz    Types: 3 Cans of beer per week  . Drug use: No    No Known Allergies  Current Facility-Administered Medications  Medication Dose Route Frequency Provider Last Rate Last Dose  .  acetaminophen (TYLENOL) tablet 650 mg  650 mg Oral Q6H PRN [name], MD       Or  . acetaminophen (TYLENOL) suppository 650 mg  650 mg Rectal Q6H PRN [name], MD      . atorvastatin (LIPITOR) tablet 40 mg  40 mg Oral q1800 [name], MD   40 mg at 02/21/18 1808  . B-complex with vitamin C tablet 1 tablet  1 tablet Oral Daily [name], MD   1 tablet at 02/22/18 1105  . bisacodyl (DULCOLAX) EC tablet 5 mg  5 mg Oral Daily PRN [name], MD      . docusate sodium (COLACE) capsule 100 mg  100 mg Oral BID [name], MD   100 mg at 02/22/18  1105  . ipratropium-albuterol (DUONEB) 0.5-2.5 (3) MG/3ML nebulizer solution 3 mL  3 mL Nebulization Q6H PRN [name], MD      . lisinopril (PRINIVIL,ZESTRIL) tablet 10 mg  10 mg Oral Daily [name], MD   10 mg at 02/22/18 1105  . loratadine (CLARITIN) tablet 10 mg  10 mg Oral Daily [name], MD   10 mg at 02/22/18 1105  . LORazepam (ATIVAN) tablet 1 mg  1 mg Oral QHS PRN [name], MD   1 mg at 02/21/18 2236  . morphine 2 MG/ML injection 1 mg  1 mg Intravenous Q2H PRN [name], MD   1 mg at 02/22/18 6440  . multivitamin with minerals tablet 1 tablet  1 tablet Oral Daily [name], MD   1 tablet at 02/22/18 1106  . ondansetron (ZOFRAN) tablet 4 mg  4 mg Oral Q6H PRN [name], MD       Or  . ondansetron (ZOFRAN) injection 4 mg  4 mg Intravenous Q6H PRN [name], MD      . Derrill Memo ON 02/26/2018] Vitamin D (Ergocalciferol) (DRISDOL) capsule 50,000 Units  50,000 Units Oral Q7 days [name], MD          Review of Systems A complete review of systems was asked and was negative except for the following positive findings some weight loss, increasing shortness of breath and chest pain.  Blood pressure 121/61, pulse 90, temperature 98 F (36.7 C), temperature source Oral, resp. rate 20, height 5\' 6"  (1.676 m), weight 175 lb 0.7 oz (79.4 kg), SpO2 96 %.  Physical Exam CONSTITUTIONAL:  Pleasant, well-developed, well-nourished, and in no acute distress. EYES: Pupils equal and reactive to light, Sclera non-icteric EARS, NOSE, MOUTH AND THROAT:  The oropharynx was clear.  Dentition is good repair.  Oral mucosa pink and moist. LYMPH NODES:  Lymph nodes in the neck and axillae were normal RESPIRATORY:  Lungs were clear on the left but were markedly diminished on the right..  Normal respiratory effort without pathologic use of accessory muscles of respiration CARDIOVASCULAR: Heart was regular without murmurs.  There were  no carotid bruits. GI: The abdomen was soft, nontender, and nondistended. There were no palpable masses. There was no hepatosplenomegaly. There were normal bowel sounds in all quadrants. GU:  Rectal deferred.   MUSCULOSKELETAL:  Normal muscle strength and tone.  No clubbing or cyanosis.   SKIN:  There were no pathologic skin lesions.  There were no nodules on palpation. NEUROLOGIC:  Sensation is normal.  Cranial nerves are grossly intact. PSYCH:  Oriented to person, place and time.  Mood and affect are normal.  Data Reviewed Chest x-rays and CT scans  I have personally reviewed the patient's  imaging, laboratory findings and medical records.    Assessment    I have independently reviewed the patient's x-rays.  I discussed his care with our pulmonologist and oncologist.  I do believe that he most likely has a malignant pleural effusion.  I had a long discussion today with the patient and his family.  We reviewed the options for management of his presumed malignant pleural effusion.  I discussed with him the need for additional material for all the studies are required.  I reviewed with him in detail the indications and risks of thoracoscopy possible thoracotomy with pleural biopsy talc pleurodesis and possible Pleurx catheter insertion.  We also discussed the role of a Port-A-Cath and placement of it.    Plan    After extensive discussion they would like to proceed.  We will plan to perform this tomorrow.  We will hold his Lovenox tonight.      Nestor Lewandowsky, MD 02/22/2018, 5:26 PM   Patient ID: Ruben Eaton, male   DOB: 11/16/1970, 47 y.o.   MRN: [id number]

## 2018-02-22 NOTE — Progress Notes (Addendum)
Date: 02/22/2018,   MRN# [id number] Ruben Eaton [birth year] Code Status:     Code Status Orders  (From admission, onward)        Start     Ordered   02/21/18 1849  Full code  Continuous     02/21/18 1849    Code Status History    Date Active Date Inactive Code Status Order ID Comments User Context   10/07/2016 1244 10/08/2016 1701 Full Code [id number]  [name], MD Inpatient   09/07/2016 0533 09/08/2016 1532 Full Code [id number]  Saundra Shelling, MD Inpatient       CC: right large pleural effusion, s/p right thoracentesis.   HPI: This is a 47 yr old male, non significant smoking hx seen in urgent care for worsening sob. On w/u he had a large right pleural effusion with displacemnet of the heart to the contralateral side. Right thoracentesis was done 2000 cc was removed. Feeling better. Fliud described as turbid.    PMHX:   Past Medical History:  Diagnosis Date  . 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 ED (erectile dysfunction)   . Hx of insomnia   . Hypertension   . OSA on CPAP   . PFO (patent foramen ovale)    a. s/p closure 09/2016 by Dr. [name].   Surgical Hx:  Past Surgical History:  Procedure Laterality Date  . APPENDECTOMY  1980  . EYE SURGERY    . PATENT FORAMEN OVALE CLOSURE  10/07/2016  . PATENT FORAMEN OVALE CLOSURE N/A 10/07/2016   Procedure: Patent Forament [place](PFO) Closure;  Surgeon: [name], MD;  Location: [facility];  Service: Cardiovascular;  Laterality: N/A;  . STRABISMUS SURGERY Left 1984  . TEE WITHOUT CARDIOVERSION N/A 09/08/2016   Procedure: TRANSESOPHAGEAL ECHOCARDIOGRAM (TEE);  Surgeon: [name], MD;  Location: [facility];  Service: Cardiovascular;  Laterality: N/A;  . VITRECTOMY Right 2013   Family Hx:  History reviewed. No pertinent family history. Social Hx:   Social History   Tobacco Use  . Smoking status: Former Smoker    Packs/day: 0.50    Years: 10.00     Pack years: 5.00    Last attempt to quit: 2011    Years since quitting: 8.4  . Smokeless tobacco: Never Used  Substance Use Topics  . Alcohol use: Yes    Alcohol/week: 1.8 oz    Types: 3 Cans of beer per week  . Drug use: No   Medication:    Home Medication:    Current Medication: @CURMEDTAB @   Allergies:  Patient has no known allergies.  Review of Systems: Gen:  Denies  fever, sweats, chills HEENT: Denies blurred vision, double vision, ear pain, eye pain, hearing loss, nose bleeds, sore throat Cvc:  No dizziness, chest pain or heaviness Resp: Less sob, minimum cough, no hemopytysis    Gi: Denies swallowing difficulty, stomach pain, nausea or vomiting, diarrhea, constipation, bowel incontinence Gu:  Denies bladder incontinence, burning urine Ext:   No Joint pain, stiffness or swelling Skin: No skin rash, easy bruising or bleeding or hives Endoc:  No polyuria, polydipsia , polyphagia or weight change Psych: No depression, insomnia or hallucinations  Other:  All other systems negative  Physical Examination:   VS: BP 121/61 (BP Location: Right Arm)   Pulse 90   Temp 98 F (36.7 C) (Oral)   Resp 20   Ht 5\' 6"  (1.676 m)   Wt 79.4 kg (175 lb  0.7 oz)   SpO2 96%   BMI 28.25 kg/m   General Appearance: No distress  Neuro: without focal findings, mental status, speech normal, alert and oriented, cranial nerves 2-12 intact, reflexes normal and symmetric, sensation grossly normal  HEENT: PERRLA, EOM intact, no ptosis, no other lesions noticed NECK: Supple, no jvd Pulmonary:.No wheezing, No rales  :   Cardiovascular:  Normal S1,S2.  No m/r/g.  Endoc: No evident thyromegaly, no signs of acromegaly or Cushing features Skin:   warm, no rashes, no ecchymosis  Extremities: normal, no cyanosis, clubbing, no edema, warm with normal capillary refill. Other findings:   Labs results:   Recent Labs    02/21/18 1602 02/21/18 2026 02/22/18 0543  HGB 14.5  --  13.7  HCT 41.8  --   38.9*  MCV 94.2  --  94.3  WBC 9.9  --  8.9  BUN 13  --  15  CREATININE 0.89 0.89 0.94  GLUCOSE 125*  --  119*  CALCIUM 8.7*  --  8.7*  ,   Amylase, Fluid U/L   Color, Fluid YELLOW REDAbnormal    Appearance, Fluid CLEAR TURBIDAbnormal    WBC, Fluid cu mm 497   Neutrophil Count, Fluid % 19   Lymphs, Fluid % 72   Monocyte-Macrophage-Serous Fluid % 9        Rad results:   Dg Chest 2 View  Result Date: 02/21/2018 CLINICAL DATA:  Shortness of breath and chest pain beginning 3 days ago. EXAM: CHEST - 2 VIEW COMPARISON:  07/21/2006 FINDINGS: Left hemithorax is clear. ASD closure device overlies the heart. There is a large right effusion with near complete collapse of the right lung. Partial aeration of the right upper lobe. Mediastinum bows towards the left. No significant bone finding. IMPRESSION: Large right effusion with near complete collapse of the right lung. Partial aeration of the right upper lobe. Mediastinal shift towards the left. Left chest otherwise clear. ASD closure device. Electronically Signed   By: Nelson Chimes M.D.   On: 02/21/2018 16:20   Ct Chest W Contrast  Result Date: 02/21/2018 CLINICAL DATA:  Patient with shortness of breath. History of bronchitis. Large right pleural effusion. EXAM: CT CHEST WITH CONTRAST TECHNIQUE: Multidetector CT imaging of the chest was performed during intravenous contrast administration. CONTRAST:  40mL ISOVUE-370 IOPAMIDOL (ISOVUE-370) INJECTION 76% COMPARISON:  Chest radiograph 02/21/2018 FINDINGS: Cardiovascular: Normal heart size. Trace fluid superior pericardial recess. Normal caliber aorta and main pulmonary artery. Mediastinum/Nodes: Leftward mediastinal shift secondary to large right pleural effusion. Enlarged mediastinal lymph nodes are demonstrated including a 1.3 cm prevascular lymph node (image 55; series 2). There is a 0.8 cm right paratracheal lymph node (image 52; series 2). Esophagus is poorly visualized. Lungs/Pleura: Mild  leftward shift of the tracheal air column. There is a 6 mm nodule within the left lower lobe (image 121; series 3). There is a 6 mm left lower lobe nodule (image 79; series 3). There is a 5 mm nodule in the lingula (image 55; series 3). There is a 3 mm nodule within the left upper lobe (image 41; series 3). There is a large right pleural effusion with near total collapse of the right lung. There is minimal aeration of the right upper lobe. Multiple enhancing predominantly pleural-based nodules are demonstrated throughout the right hemithorax. There is a more dominant pleural-based nodule demonstrated within the anterior right upper hemithorax which measures up to 7 cm. Upper Abdomen: Unremarkable. Musculoskeletal: Thoracic spine degenerative changes. No aggressive or acute  appearing osseous lesions. IMPRESSION: 1. Multiple enhancing pleural-based nodules are demonstrated throughout the right hemithorax with large right pleural effusion most concerning for pleural-based malignancy/metastatic disease. There is a more dominant pleural-based mass within the anterior right hemithorax. 2. Multiple enlarged mediastinal lymph nodes concerning for nodal metastatic disease. 3. Multiple nodules within the left lung concerning for the possibility of pulmonary metastatic disease. 4. These results were called by telephone at the time of interpretation on 02/21/2018 at 5:58 pm. Electronically Signed   By: Lovey Newcomer M.D.   On: 02/21/2018 18:00   Dg Chest Port 1 View  Result Date: 02/22/2018 CLINICAL DATA:  Post right-sided thoracentesis EXAM: PORTABLE CHEST 1 VIEW COMPARISON:  02/21/2018; chest CT-02/21/2018 FINDINGS: Interval reduction in persistent moderate-sized right-sided effusion post thoracentesis. No pneumothorax. Slight reduction in previously noted right to left mediastinal shift. Otherwise, unchanged cardiac silhouette and mediastinal contours with persistent obscuration of the right heart border secondary to right  basilar consolidative opacities. Post PFO ligation. No new focal airspace opacities. The left hemithorax remains well aerated. No acute osseus abnormalities. IMPRESSION: Interval reduction in persistent moderate-sized right-sided effusion post thoracentesis. No pneumothorax. Electronically Signed   By: Sandi Mariscal M.D.   On: 02/22/2018 10:41   US Thoracentesis Asp Pleural Space W/img Guide  Result Date: 02/22/2018 INDICATION: No known primary, now with multiple pleural based masses and symptomatic right-sided pleural effusion. Please perform ultrasound-guided thoracentesis for diagnostic and therapeutic purposes. EXAM: US THORACENTESIS ASP PLEURAL SPACE W/IMG GUIDE COMPARISON:  Chest radiograph - 02/21/2018; chest CT - 02/21/2018 MEDICATIONS: None. COMPLICATIONS: None immediate. TECHNIQUE: Informed written consent was obtained from the patient after a discussion of the risks, benefits and alternatives to treatment. A timeout was performed prior to the initiation of the procedure. Initial ultrasound scanning demonstrates a large anechoic right-sided pleural effusion. The lower chest was prepped and draped in the usual sterile fashion. 1% lidocaine was used for local anesthesia. An ultrasound image was saved for documentation purposes. An 8 Fr Safe-T-Centesis catheter was introduced. The thoracentesis was performed. The catheter was removed and a dressing was applied. The patient tolerated the procedure well without immediate post procedural complication. The patient was escorted to have an upright chest radiograph. FINDINGS: A total of approximately 2 liters of serous, slightly blood tinged fluid was removed. Requested samples were sent to the laboratory. IMPRESSION: Successful ultrasound-guided right sided thoracentesis yielding 2 liters of pleural fluid. Electronically Signed   By: Sandi Mariscal M.D.   On: 02/22/2018 11:06     Large right pleural effusion with pleural base densities or masses. Overall concern  of malignancy. Oncology has seen, t-surg to see -recommend vats ( bx and subsequent pleuravax placement) -following peripherally for now   I have personally obtained a history, examined the patient, evaluated laboratory and imaging results, formulated the assessment and plan and placed orders.  The Patient requires high complexity decision making for assessment and support, frequent evaluation and titration of therapies, application of advanced monitoring technologies and extensive interpretation of multiple databases.   Ritchard Paragas,M.D. Pulmonary & Critical care Medicine [facility]

## 2018-02-22 NOTE — Care Management (Addendum)
Presented with fatigue and shortness of breath.  Found to have right sided pleural effusion. 2L of fluid drained off.  There is concern for malignancy. Oncology and pulmonary consults pending.  Room air

## 2018-02-22 NOTE — Progress Notes (Signed)
[facility] [place] NAME: Ruben Eaton    MR#:  [id number]  DATE OF BIRTH:  Sep 02, 1971  SUBJECTIVE:  CHIEF COMPLAINT:   Chief Complaint  Patient presents with  . Shortness of Breath    R sided Pneumothorax  Complaining of right-sided chest discomfort, status post thoracentesis with 2 L of fluid drained off, pulmonology to see  REVIEW OF SYSTEMS:  CONSTITUTIONAL: No fever, fatigue or weakness.  EYES: No blurred or double vision.  EARS, NOSE, AND THROAT: No tinnitus or ear pain.  RESPIRATORY: No cough, shortness of breath, wheezing or hemoptysis.  CARDIOVASCULAR: No chest pain, orthopnea, edema.  GASTROINTESTINAL: No nausea, vomiting, diarrhea or abdominal pain.  GENITOURINARY: No dysuria, hematuria.  ENDOCRINE: No polyuria, nocturia,  HEMATOLOGY: No anemia, easy bruising or bleeding SKIN: No rash or lesion. MUSCULOSKELETAL: No joint pain or arthritis.   NEUROLOGIC: No tingling, numbness, weakness.  PSYCHIATRY: No anxiety or depression.   ROS  DRUG ALLERGIES:  No Known Allergies  VITALS:  Blood pressure (!) 122/52, pulse 71, temperature 98.1 F (36.7 C), temperature source Oral, resp. rate 20, height 5\' 6"  (1.676 m), weight 79.4 kg (175 lb 0.7 oz), SpO2 96 %.  PHYSICAL EXAMINATION:  GENERAL:  47 y.o.-year-old patient lying in the bed with no acute distress.  EYES: Pupils equal, round, reactive to light and accommodation. No scleral icterus. Extraocular muscles intact.  HEENT: Head atraumatic, normocephalic. Oropharynx and nasopharynx clear.  NECK:  Supple, no jugular venous distention. No thyroid enlargement, no tenderness.  LUNGS: Normal breath sounds bilaterally, no wheezing, rales,rhonchi or crepitation. No use of accessory muscles of respiration.  CARDIOVASCULAR: S1, S2 normal. No murmurs, rubs, or gallops.  ABDOMEN: Soft, nontender, nondistended. Bowel sounds present. No organomegaly or mass.  EXTREMITIES: No pedal edema,  cyanosis, or clubbing.  NEUROLOGIC: Cranial nerves II through XII are intact. Muscle strength 5/5 in all extremities. Sensation intact. Gait not checked.  PSYCHIATRIC: The patient is alert and oriented x 3.  SKIN: No obvious rash, lesion, or ulcer.   Physical Exam LABORATORY PANEL:   CBC Recent Labs  Lab 02/22/18 0543  WBC 8.9  HGB 13.7  HCT 38.9*  PLT 290   ------------------------------------------------------------------------------------------------------------------  Chemistries  Recent Labs  Lab 02/22/18 0543  NA 128*  K 4.6  CL 93*  CO2 25  GLUCOSE 119*  BUN 15  CREATININE 0.94  CALCIUM 8.7*   ------------------------------------------------------------------------------------------------------------------  Cardiac Enzymes Recent Labs  Lab 02/21/18 1602  TROPONINI <0.03   ------------------------------------------------------------------------------------------------------------------  RADIOLOGY:  Dg Chest 2 View  Result Date: 02/21/2018 CLINICAL DATA:  Shortness of breath and chest pain beginning 3 days ago. EXAM: CHEST - 2 VIEW COMPARISON:  07/21/2006 FINDINGS: Left hemithorax is clear. ASD closure device overlies the heart. There is a large right effusion with near complete collapse of the right lung. Partial aeration of the right upper lobe. Mediastinum bows towards the left. No significant bone finding. IMPRESSION: Large right effusion with near complete collapse of the right lung. Partial aeration of the right upper lobe. Mediastinal shift towards the left. Left chest otherwise clear. ASD closure device. Electronically Signed   By: Nelson Chimes M.D.   On: 02/21/2018 16:20   Ct Chest W Contrast  Result Date: 02/21/2018 CLINICAL DATA:  Patient with shortness of breath. History of bronchitis. Large right pleural effusion. EXAM: CT CHEST WITH CONTRAST TECHNIQUE: Multidetector CT imaging of the chest was performed during intravenous contrast administration.  CONTRAST:  1mL ISOVUE-370 IOPAMIDOL (  ISOVUE-370) INJECTION 76% COMPARISON:  Chest radiograph 02/21/2018 FINDINGS: Cardiovascular: Normal heart size. Trace fluid superior pericardial recess. Normal caliber aorta and main pulmonary artery. Mediastinum/Nodes: Leftward mediastinal shift secondary to large right pleural effusion. Enlarged mediastinal lymph nodes are demonstrated including a 1.3 cm prevascular lymph node (image 55; series 2). There is a 0.8 cm right paratracheal lymph node (image 52; series 2). Esophagus is poorly visualized. Lungs/Pleura: Mild leftward shift of the tracheal air column. There is a 6 mm nodule within the left lower lobe (image 121; series 3). There is a 6 mm left lower lobe nodule (image 79; series 3). There is a 5 mm nodule in the lingula (image 55; series 3). There is a 3 mm nodule within the left upper lobe (image 41; series 3). There is a large right pleural effusion with near total collapse of the right lung. There is minimal aeration of the right upper lobe. Multiple enhancing predominantly pleural-based nodules are demonstrated throughout the right hemithorax. There is a more dominant pleural-based nodule demonstrated within the anterior right upper hemithorax which measures up to 7 cm. Upper Abdomen: Unremarkable. Musculoskeletal: Thoracic spine degenerative changes. No aggressive or acute appearing osseous lesions. IMPRESSION: 1. Multiple enhancing pleural-based nodules are demonstrated throughout the right hemithorax with large right pleural effusion most concerning for pleural-based malignancy/metastatic disease. There is a more dominant pleural-based mass within the anterior right hemithorax. 2. Multiple enlarged mediastinal lymph nodes concerning for nodal metastatic disease. 3. Multiple nodules within the left lung concerning for the possibility of pulmonary metastatic disease. 4. These results were called by telephone at the time of interpretation on 02/21/2018 at 5:58 pm.  Electronically Signed   By: Lovey Newcomer M.D.   On: 02/21/2018 18:00   Dg Chest Port 1 View  Result Date: 02/22/2018 CLINICAL DATA:  Post right-sided thoracentesis EXAM: PORTABLE CHEST 1 VIEW COMPARISON:  02/21/2018; chest CT-02/21/2018 FINDINGS: Interval reduction in persistent moderate-sized right-sided effusion post thoracentesis. No pneumothorax. Slight reduction in previously noted right to left mediastinal shift. Otherwise, unchanged cardiac silhouette and mediastinal contours with persistent obscuration of the right heart border secondary to right basilar consolidative opacities. Post PFO ligation. No new focal airspace opacities. The left hemithorax remains well aerated. No acute osseus abnormalities. IMPRESSION: Interval reduction in persistent moderate-sized right-sided effusion post thoracentesis. No pneumothorax. Electronically Signed   By: Sandi Mariscal M.D.   On: 02/22/2018 10:41    ASSESSMENT AND PLAN:  47 year old male patient with history of hypertension, previous stroke found to have PFO came in because of worsening shortness of breath for the past 3 to 5 days  * Acute right pleural effusion Suspected due to malignant etiology CT chest noted for lung mass Status post thoracentesis Feb 22, 2018 with 2 L of bloody fluid removed Follow-up on cytology, pulmonology and oncology to see  *Acute right lung mass Suspect due to neoplasm Plan of care as stated above  *Chronic benign essential hypertension Stable on current regiment  *Acute hyponatremia, hypochloremia Cannot rule out possible SIADH Check urine sodium, urine creatinine, TSH, urine osmolality, serum osmolality, BMP in the morning  *Asymptomatic sinus bradycardia Resolved Likely secondary to regular exercise   All the records are reviewed and case discussed with Care Management/Social Workerr. Management plans discussed with the patient, family and they are in agreement.  CODE STATUS: full  TOTAL TIME TAKING CARE  OF THIS PATIENT: 35 minutes.     POSSIBLE D/C IN 1-2 DAYS, DEPENDING ON CLINICAL CONDITION.   Eylin Pontarelli D Araceli Arango  M.D on 02/22/2018   Between 7am to 6pm - Pager - [phone]  After 6pm go to [email or web address] - password [email or web address] [facility]  Office  [phone]  CC: Primary care physician; Dion Body, MD  Note: This dictation was prepared with Dragon dictation along with smaller phrase technology. Any transcriptional errors that result from this process are unintentional.

## 2018-02-23 ENCOUNTER — Encounter: Payer: Self-pay | Admitting: *Deleted

## 2018-02-23 ENCOUNTER — Inpatient Hospital Stay: Payer: No Typology Code available for payment source | Admitting: Anesthesiology

## 2018-02-23 ENCOUNTER — Inpatient Hospital Stay: Payer: No Typology Code available for payment source

## 2018-02-23 ENCOUNTER — Encounter
Admission: EM | Disposition: A | Discharge status: Home / Self Care | Payer: Self-pay | Source: Ambulatory Visit | Attending: Internal Medicine

## 2018-02-23 ENCOUNTER — Encounter: Payer: Self-pay | Admitting: Internal Medicine

## 2018-02-23 DIAGNOSIS — J910 Malignant pleural effusion: Secondary | ICD-10-CM

## 2018-02-23 HISTORY — PX: VIDEO ASSISTED THORACOSCOPY (VATS) W/TALC PLEUADESIS: SHX6168

## 2018-02-23 HISTORY — PX: PORTACATH PLACEMENT: SHX2246

## 2018-02-23 LAB — COMPREHENSIVE METABOLIC PANEL
ALBUMIN: 3 g/dL — AB (ref 3.5–5.0)
ALT: 20 U/L (ref 17–63)
AST: 24 U/L (ref 15–41)
Alkaline Phosphatase: 54 U/L (ref 38–126)
Anion gap: 8 (ref 5–15)
BUN: 17 mg/dL (ref 6–20)
CHLORIDE: 99 mmol/L — AB (ref 101–111)
CO2: 24 mmol/L (ref 22–32)
Calcium: 8.4 mg/dL — ABNORMAL LOW (ref 8.9–10.3)
Creatinine, Ser: 0.79 mg/dL (ref 0.61–1.24)
GFR calc Af Amer: >60 mL/min (ref >60)
Glucose, Bld: 107 mg/dL — ABNORMAL HIGH (ref 65–99)
POTASSIUM: 4 mmol/L (ref 3.5–5.1)
SODIUM: 131 mmol/L — AB (ref 135–145)
Total Bilirubin: 0.4 mg/dL (ref 0.3–1.2)
Total Protein: 5.8 g/dL — ABNORMAL LOW (ref 6.5–8.1)

## 2018-02-23 LAB — PROTIME-INR
INR: 1.05
Prothrombin Time: 13.6 seconds (ref 11.4–15.2)

## 2018-02-23 LAB — GLUCOSE, CAPILLARY: Glucose-Capillary: 99 mg/dL (ref 65–99)

## 2018-02-23 LAB — CBC
HEMATOCRIT: 38.4 % — AB (ref 40.0–52.0)
Hemoglobin: 13.7 g/dL (ref 13.0–18.0)
MCH: 33.5 pg (ref 26.0–34.0)
MCHC: 35.7 g/dL (ref 32.0–36.0)
MCV: 93.8 fL (ref 80.0–100.0)
Platelets: 291 10*3/uL (ref 150–440)
RBC: 4.09 MIL/uL — ABNORMAL LOW (ref 4.40–5.90)
RDW: 12.9 % (ref 11.5–14.5)
WBC: 8.4 10*3/uL (ref 3.8–10.6)

## 2018-02-23 LAB — APTT: APTT: 29 s (ref 24–36)

## 2018-02-23 LAB — HIV ANTIBODY (ROUTINE TESTING W REFLEX): HIV Screen 4th Generation wRfx: NONREACTIVE

## 2018-02-23 LAB — CYTOLOGY - NON PAP

## 2018-02-23 SURGERY — VIDEO ASSISTED THORACOSCOPY (VATS) W/TALC PLEUADESIS
Anesthesia: General | Laterality: Right | Wound class: "Clean Contaminated "

## 2018-02-23 MED ORDER — LORAZEPAM 1 MG PO TABS
1.0000 mg | ORAL_TABLET | Freq: Every evening | ORAL | Status: DC | PRN
  Administered 2018-02-23 – 2018-02-26 (×4): 1 mg via ORAL
  Filled 2018-02-23 (×4): qty 1

## 2018-02-23 MED ORDER — PROPOFOL 10 MG/ML IV BOLUS
INTRAVENOUS | Status: AC
  Filled 2018-02-23: qty 20

## 2018-02-23 MED ORDER — TRAMADOL HCL 50 MG PO TABS
50.0000 mg | ORAL_TABLET | Freq: Four times a day (QID) | ORAL | Status: DC
  Administered 2018-02-23: 50 mg via ORAL
  Administered 2018-02-23 – 2018-02-24 (×4): 100 mg via ORAL
  Administered 2018-02-24 – 2018-02-26 (×6): 50 mg via ORAL
  Administered 2018-02-26: 100 mg via ORAL
  Filled 2018-02-23 (×4): qty 1
  Filled 2018-02-23 (×3): qty 2
  Filled 2018-02-23 (×2): qty 1
  Filled 2018-02-23: qty 2
  Filled 2018-02-23: qty 1
  Filled 2018-02-23: qty 2
  Filled 2018-02-23: qty 1

## 2018-02-23 MED ORDER — LIDOCAINE HCL (PF) 1 % IJ SOLN
INTRAMUSCULAR | Status: AC
  Filled 2018-02-23: qty 30

## 2018-02-23 MED ORDER — LIDOCAINE HCL (PF) 2 % IJ SOLN
INTRAMUSCULAR | Status: AC
  Filled 2018-02-23: qty 10

## 2018-02-23 MED ORDER — MORPHINE SULFATE (PF) 2 MG/ML IV SOLN
1.0000 mg | INTRAVENOUS | Status: DC | PRN
  Administered 2018-02-23: 22:00:00 2 mg via INTRAVENOUS
  Administered 2018-02-23 – 2018-02-24 (×2): 1 mg via INTRAVENOUS
  Administered 2018-02-24: 2 mg via INTRAVENOUS
  Administered 2018-02-24: 04:00:00 1 mg via INTRAVENOUS
  Filled 2018-02-23 (×7): qty 1

## 2018-02-23 MED ORDER — GLYCOPYRROLATE 0.2 MG/ML IJ SOLN
INTRAMUSCULAR | Status: AC
  Filled 2018-02-23: qty 1

## 2018-02-23 MED ORDER — ROCURONIUM BROMIDE 100 MG/10ML IV SOLN
INTRAVENOUS | Status: DC | PRN
  Administered 2018-02-23 (×2): 10 mg via INTRAVENOUS
  Administered 2018-02-23: 40 mg via INTRAVENOUS
  Administered 2018-02-23: 10 mg via INTRAVENOUS
  Administered 2018-02-23 (×2): 20 mg via INTRAVENOUS

## 2018-02-23 MED ORDER — SEVOFLURANE IN SOLN
RESPIRATORY_TRACT | Status: AC
  Filled 2018-02-23: qty 250

## 2018-02-23 MED ORDER — CEFAZOLIN SODIUM-DEXTROSE 2-4 GM/100ML-% IV SOLN
INTRAVENOUS | Status: AC
  Filled 2018-02-23: qty 100

## 2018-02-23 MED ORDER — PHENYLEPHRINE HCL 10 MG/ML IJ SOLN
INTRAMUSCULAR | Status: AC
  Filled 2018-02-23: qty 1

## 2018-02-23 MED ORDER — FENTANYL CITRATE (PF) 100 MCG/2ML IJ SOLN
INTRAMUSCULAR | Status: DC | PRN
  Administered 2018-02-23: 100 ug via INTRAVENOUS
  Administered 2018-02-23: 50 ug via INTRAVENOUS
  Administered 2018-02-23: 100 ug via INTRAVENOUS
  Administered 2018-02-23 (×2): 50 ug via INTRAVENOUS

## 2018-02-23 MED ORDER — SUCCINYLCHOLINE CHLORIDE 20 MG/ML IJ SOLN
INTRAMUSCULAR | Status: DC | PRN
  Administered 2018-02-23: 100 mg via INTRAVENOUS

## 2018-02-23 MED ORDER — FENTANYL CITRATE (PF) 100 MCG/2ML IJ SOLN
INTRAMUSCULAR | Status: AC
  Administered 2018-02-23: 25 ug via INTRAVENOUS
  Filled 2018-02-23: qty 2

## 2018-02-23 MED ORDER — FENTANYL CITRATE (PF) 100 MCG/2ML IJ SOLN
25.0000 ug | INTRAMUSCULAR | Status: DC | PRN
  Administered 2018-02-23 (×3): 25 ug via INTRAVENOUS

## 2018-02-23 MED ORDER — TALC (STERITALC) POWDER FOR INTRAPLEURAL USE
INTRAPLEURAL | Status: AC
  Filled 2018-02-23: qty 4

## 2018-02-23 MED ORDER — BUPIVACAINE HCL (PF) 0.25 % IJ SOLN
INTRAMUSCULAR | Status: DC | PRN
  Administered 2018-02-23: 10 mL

## 2018-02-23 MED ORDER — PHENYLEPHRINE HCL 10 MG/ML IJ SOLN
INTRAMUSCULAR | Status: DC | PRN
  Administered 2018-02-23: 30 ug/min via INTRAVENOUS

## 2018-02-23 MED ORDER — LIDOCAINE HCL (PF) 1 % IJ SOLN
INTRAMUSCULAR | Status: DC | PRN
  Administered 2018-02-23: 10 mL

## 2018-02-23 MED ORDER — PHENYLEPHRINE HCL 10 MG/ML IJ SOLN
INTRAMUSCULAR | Status: DC | PRN
  Administered 2018-02-23 (×8): 100 ug via INTRAVENOUS

## 2018-02-23 MED ORDER — ONDANSETRON HCL 4 MG/2ML IJ SOLN
INTRAMUSCULAR | Status: AC
  Filled 2018-02-23: qty 2

## 2018-02-23 MED ORDER — MIDAZOLAM HCL 2 MG/2ML IJ SOLN
INTRAMUSCULAR | Status: DC | PRN
  Administered 2018-02-23: 2 mg via INTRAVENOUS

## 2018-02-23 MED ORDER — ONDANSETRON HCL 4 MG/2ML IJ SOLN: 4.0000 mg | Freq: Four times a day (QID) | INTRAMUSCULAR | Status: DC | PRN

## 2018-02-23 MED ORDER — ACETAMINOPHEN 10 MG/ML IV SOLN
INTRAVENOUS | Status: DC | PRN
  Administered 2018-02-23: 1000 mg via INTRAVENOUS

## 2018-02-23 MED ORDER — ACETAMINOPHEN 10 MG/ML IV SOLN
INTRAVENOUS | Status: AC
  Filled 2018-02-23: qty 100

## 2018-02-23 MED ORDER — PROPOFOL 10 MG/ML IV BOLUS
INTRAVENOUS | Status: DC | PRN
  Administered 2018-02-23: 200 mg via INTRAVENOUS

## 2018-02-23 MED ORDER — BISACODYL 5 MG PO TBEC
10.0000 mg | DELAYED_RELEASE_TABLET | Freq: Every day | ORAL | Status: DC
  Filled 2018-02-23: qty 2

## 2018-02-23 MED ORDER — ROCURONIUM BROMIDE 100 MG/10ML IV SOLN
INTRAVENOUS | Status: AC
  Filled 2018-02-23: qty 1

## 2018-02-23 MED ORDER — GLYCOPYRROLATE 0.2 MG/ML IJ SOLN
INTRAMUSCULAR | Status: DC | PRN
  Administered 2018-02-23: 0.2 mg via INTRAVENOUS

## 2018-02-23 MED ORDER — DEXAMETHASONE SODIUM PHOSPHATE 10 MG/ML IJ SOLN
INTRAMUSCULAR | Status: DC | PRN
  Administered 2018-02-23: 10 mg via INTRAVENOUS

## 2018-02-23 MED ORDER — FENTANYL CITRATE (PF) 250 MCG/5ML IJ SOLN
INTRAMUSCULAR | Status: AC
  Filled 2018-02-23: qty 5

## 2018-02-23 MED ORDER — ALBUTEROL SULFATE (2.5 MG/3ML) 0.083% IN NEBU
2.5000 mg | INHALATION_SOLUTION | RESPIRATORY_TRACT | Status: DC
  Administered 2018-02-23: 2.5 mg via RESPIRATORY_TRACT
  Filled 2018-02-23: qty 3

## 2018-02-23 MED ORDER — LACTATED RINGERS IV SOLN
INTRAVENOUS | Status: DC
  Administered 2018-02-23 (×3): via INTRAVENOUS

## 2018-02-23 MED ORDER — TALC 5 G PL SUSR
INTRAPLEURAL | Status: DC | PRN
  Administered 2018-02-23: 4 g via INTRAPLEURAL

## 2018-02-23 MED ORDER — IPRATROPIUM-ALBUTEROL 0.5-2.5 (3) MG/3ML IN SOLN
RESPIRATORY_TRACT | Status: AC
  Filled 2018-02-23: qty 3

## 2018-02-23 MED ORDER — DEXAMETHASONE SODIUM PHOSPHATE 10 MG/ML IJ SOLN
INTRAMUSCULAR | Status: AC
  Filled 2018-02-23: qty 1

## 2018-02-23 MED ORDER — CEFAZOLIN SODIUM-DEXTROSE 2-4 GM/100ML-% IV SOLN
2.0000 g | Freq: Three times a day (TID) | INTRAVENOUS | Status: AC
  Administered 2018-02-23 – 2018-02-24 (×2): 2 g via INTRAVENOUS
  Filled 2018-02-23 (×3): qty 100

## 2018-02-23 MED ORDER — IPRATROPIUM-ALBUTEROL 0.5-2.5 (3) MG/3ML IN SOLN: 3.0000 mL | Freq: Once | RESPIRATORY_TRACT | Status: DC

## 2018-02-23 MED ORDER — SUGAMMADEX SODIUM 500 MG/5ML IV SOLN
INTRAVENOUS | Status: DC | PRN
  Administered 2018-02-23: 159.4 mg via INTRAVENOUS

## 2018-02-23 MED ORDER — LIDOCAINE HCL (CARDIAC) PF 100 MG/5ML IV SOSY
PREFILLED_SYRINGE | INTRAVENOUS | Status: DC | PRN
  Administered 2018-02-23 (×2): 100 mg via INTRAVENOUS

## 2018-02-23 MED ORDER — BUPIVACAINE HCL (PF) 0.25 % IJ SOLN
INTRAMUSCULAR | Status: AC
  Filled 2018-02-23: qty 30

## 2018-02-23 MED ORDER — SUGAMMADEX SODIUM 500 MG/5ML IV SOLN
INTRAVENOUS | Status: AC
  Filled 2018-02-23: qty 5

## 2018-02-23 MED ORDER — CEFAZOLIN SODIUM-DEXTROSE 2-4 GM/100ML-% IV SOLN
2.0000 g | INTRAVENOUS | Status: AC
  Administered 2018-02-23: 2 g via INTRAVENOUS
  Filled 2018-02-23: qty 100

## 2018-02-23 MED ORDER — MIDAZOLAM HCL 2 MG/2ML IJ SOLN
INTRAMUSCULAR | Status: AC
  Filled 2018-02-23: qty 2

## 2018-02-23 MED ORDER — ONDANSETRON HCL 4 MG/2ML IJ SOLN: 4.0000 mg | Freq: Once | INTRAMUSCULAR | Status: DC | PRN

## 2018-02-23 MED ORDER — OXYCODONE-ACETAMINOPHEN 5-325 MG PO TABS
1.0000 | ORAL_TABLET | Freq: Four times a day (QID) | ORAL | Status: DC | PRN
  Administered 2018-02-23: 23:00:00 1 via ORAL
  Administered 2018-02-24: 23:00:00 2 via ORAL
  Administered 2018-02-24 (×2): 1 via ORAL
  Administered 2018-02-25 (×2): 2 via ORAL
  Administered 2018-02-25 – 2018-02-26 (×2): 1 via ORAL
  Administered 2018-02-26: 2 via ORAL
  Administered 2018-02-27: 1 via ORAL
  Filled 2018-02-23 (×2): qty 1
  Filled 2018-02-23: qty 2
  Filled 2018-02-23: qty 1
  Filled 2018-02-23 (×2): qty 2
  Filled 2018-02-23 (×3): qty 1
  Filled 2018-02-23: qty 2

## 2018-02-23 MED ORDER — DEXTROSE-NACL 5-0.45 % IV SOLN
INTRAVENOUS | Status: DC
  Administered 2018-02-23 – 2018-02-24 (×2): via INTRAVENOUS

## 2018-02-23 MED ORDER — FENTANYL CITRATE (PF) 100 MCG/2ML IJ SOLN
INTRAMUSCULAR | Status: AC
  Filled 2018-02-23: qty 2

## 2018-02-23 MED ORDER — SUCCINYLCHOLINE CHLORIDE 20 MG/ML IJ SOLN
INTRAMUSCULAR | Status: AC
  Filled 2018-02-23: qty 1

## 2018-02-23 MED ORDER — BUPIVACAINE LIPOSOME 1.3 % IJ SUSP
INTRAMUSCULAR | Status: AC
  Filled 2018-02-23: qty 20

## 2018-02-23 MED ORDER — SODIUM CHLORIDE FLUSH 0.9 % IV SOLN
INTRAVENOUS | Status: AC
  Filled 2018-02-23: qty 40

## 2018-02-23 MED ORDER — HEPARIN SODIUM (PORCINE) 5000 UNIT/ML IJ SOLN
INTRAMUSCULAR | Status: AC
  Filled 2018-02-23: qty 1

## 2018-02-23 SURGICAL SUPPLY — 84 items
BAG DECANTER FOR FLEXI CONT (MISCELLANEOUS) ×3 IMPLANT
BLADE SURG 11 STRL SS SAFETY (MISCELLANEOUS) ×1 IMPLANT
BLADE SURG SZ11 CARB STEEL (BLADE) ×3 IMPLANT
BNDG COHESIVE 4X5 TAN STRL (GAUZE/BANDAGES/DRESSINGS) ×3 IMPLANT
BRONCHOSCOPE PED SLIM DISP (MISCELLANEOUS) ×3 IMPLANT
CANISTER SUCT 1200ML W/VALVE (MISCELLANEOUS) ×3 IMPLANT
CATH URET ROBINSON 16FR STRL (CATHETERS) IMPLANT
CHLORAPREP W/TINT 26ML (MISCELLANEOUS) ×6 IMPLANT
CNTNR SPEC 2.5X3XGRAD LEK (MISCELLANEOUS) ×2
CONN REDUCER 1/4X3/8 STR (CONNECTOR) ×3
CONNECTOR REDUCER 1/4X3/8 STR (CONNECTOR) IMPLANT
CONT SPEC 4OZ STER OR WHT (MISCELLANEOUS) ×1
CONTAINER SPEC 2.5X3XGRAD LEK (MISCELLANEOUS) IMPLANT
COVER LIGHT HANDLE STERIS (MISCELLANEOUS) ×6 IMPLANT
CUTTER ECHEON FLEX ENDO 45 340 (ENDOMECHANICALS) ×3 IMPLANT
DEFOGGER SCOPE WARMER CLEARIFY (MISCELLANEOUS) ×3 IMPLANT
DRAIN CHEST DRY SUCT SGL (MISCELLANEOUS) ×3 IMPLANT
DRAPE C-ARM XRAY 36X54 (DRAPES) ×3 IMPLANT
DRAPE C-SECTION (MISCELLANEOUS) ×3 IMPLANT
DRAPE MAG INST 16X20 L/F (DRAPES) ×3 IMPLANT
DRAPE POUCH INSTRU U-SHP 10X18 (DRAPES) ×3 IMPLANT
DRSG OPSITE POSTOP 3X4 (GAUZE/BANDAGES/DRESSINGS) ×9 IMPLANT
DRSG TEGADERM 2-3/8X2-3/4 SM (GAUZE/BANDAGES/DRESSINGS) ×3 IMPLANT
DRSG TEGADERM 4X4.75 (GAUZE/BANDAGES/DRESSINGS) ×3 IMPLANT
DRSG TELFA 3X8 NADH (GAUZE/BANDAGES/DRESSINGS) ×3 IMPLANT
DRSG TELFA 4X3 1S NADH ST (GAUZE/BANDAGES/DRESSINGS) ×3 IMPLANT
ELECT BLADE 6 FLAT ULTRCLN (ELECTRODE) ×3 IMPLANT
ELECT BLADE 6.5 EXT (BLADE) ×3 IMPLANT
ELECT CAUTERY BLADE TIP 2.5 (TIP) ×3
ELECT REM PT RETURN 9FT ADLT (ELECTROSURGICAL) ×3
ELECTRODE CAUTERY BLDE TIP 2.5 (TIP) ×2 IMPLANT
ELECTRODE REM PT RTRN 9FT ADLT (ELECTROSURGICAL) ×2 IMPLANT
GAUZE SPONGE 4X4 12PLY STRL (GAUZE/BANDAGES/DRESSINGS) ×4 IMPLANT
GLOVE SURG SYN 7.5  E (GLOVE) ×2
GLOVE SURG SYN 7.5 E (GLOVE) ×4 IMPLANT
GLOVE SURG SYN 7.5 PF PI (GLOVE) ×4 IMPLANT
GOWN STRL REUS W/ TWL LRG LVL3 (GOWN DISPOSABLE) ×8 IMPLANT
GOWN STRL REUS W/TWL LRG LVL3 (GOWN DISPOSABLE) ×4
IV NS 500ML (IV SOLUTION) ×1
IV NS 500ML BAXH (IV SOLUTION) ×2 IMPLANT
KIT PLEURX DRAIN CATH 15.5FR (DRAIN) ×1 IMPLANT
KIT PORT POWER 8FR ISP CVUE (Port) ×3 IMPLANT
KIT TURNOVER KIT A (KITS) ×3 IMPLANT
LABEL OR SOLS (LABEL) ×3 IMPLANT
LOOP RED MAXI  1X406MM (MISCELLANEOUS) ×1
LOOP VESSEL MAXI 1X406 RED (MISCELLANEOUS) ×2 IMPLANT
MARKER SKIN DUAL TIP RULER LAB (MISCELLANEOUS) ×3 IMPLANT
NDL FILTER BLUNT 18X1 1/2 (NEEDLE) ×2 IMPLANT
NDL SPNL 20GX3.5 QUINCKE YW (NEEDLE) ×2 IMPLANT
NEEDLE FILTER BLUNT 18X 1/2SAF (NEEDLE) ×1
NEEDLE FILTER BLUNT 18X1 1/2 (NEEDLE) ×2 IMPLANT
NEEDLE HYPO 22GX1.5 SAFETY (NEEDLE) ×6 IMPLANT
NEEDLE SPNL 20GX3.5 QUINCKE YW (NEEDLE) ×3 IMPLANT
NS IRRIG 500ML POUR BTL (IV SOLUTION) ×3 IMPLANT
PACK BASIN MAJOR ARMC (MISCELLANEOUS) ×3 IMPLANT
PACK PORT-A-CATH (MISCELLANEOUS) ×3 IMPLANT
PAD DRESSING TELFA 3X8 NADH (GAUZE/BANDAGES/DRESSINGS) IMPLANT
SCISSORS METZENBAUM CVD 33 (INSTRUMENTS) IMPLANT
SPONGE KITTNER 5P (MISCELLANEOUS) ×6 IMPLANT
STAPLER SKIN PROX 35W (STAPLE) ×3 IMPLANT
STAPLER VASCULAR ECHELON 35 (CUTTER) ×3 IMPLANT
STRIP CLOSURE SKIN 1/2X4 (GAUZE/BANDAGES/DRESSINGS) ×1 IMPLANT
STRIP CLOSURE SKIN 1/4X4 (GAUZE/BANDAGES/DRESSINGS) IMPLANT
SUT ETHILON 4-0 (SUTURE) ×2
SUT ETHILON 4-0 FS2 18XMFL BLK (SUTURE) ×4
SUT SILK 1 SH (SUTURE) ×9 IMPLANT
SUT VIC AB 0 CT1 36 (SUTURE) ×6 IMPLANT
SUT VIC AB 2-0 CT1 27 (SUTURE) ×2
SUT VIC AB 2-0 CT1 TAPERPNT 27 (SUTURE) ×4 IMPLANT
SUT VIC AB 2-0 CT2 27 (SUTURE) ×6 IMPLANT
SUT VIC AB 2-0 SH 27 (SUTURE) ×1
SUT VIC AB 2-0 SH 27XBRD (SUTURE) ×2 IMPLANT
SUTURE ETHLN 4-0 FS2 18XMF BLK (SUTURE) ×4 IMPLANT
SYR 10ML SLIP (SYRINGE) ×3 IMPLANT
SYR 3ML LL SCALE MARK (SYRINGE) ×3 IMPLANT
SYR BULB IRRIG 60ML STRL (SYRINGE) ×3 IMPLANT
TAPE ADH 3 LX (MISCELLANEOUS) ×3 IMPLANT
TAPE CLOTH 3X10 WHT NS LF (GAUZE/BANDAGES/DRESSINGS) IMPLANT
TAPE TRANSPORE STRL 2 31045 (GAUZE/BANDAGES/DRESSINGS) ×3 IMPLANT
TRAY FOLEY MTR SLVR 16FR STAT (SET/KITS/TRAYS/PACK) ×3 IMPLANT
TROCAR FLEXIPATH 20X80 (ENDOMECHANICALS) IMPLANT
TROCAR FLEXIPATH THORACIC 15MM (ENDOMECHANICALS) IMPLANT
WATER STERILE IRR 1000ML POUR (IV SOLUTION) ×3 IMPLANT
YANKAUER SUCT BULB TIP FLEX NO (MISCELLANEOUS) ×3 IMPLANT

## 2018-02-23 NOTE — Anesthesia Preprocedure Evaluation (Addendum)
Anesthesia Evaluation  Patient identified by MRN, date of birth, ID band Patient awake    Reviewed: Allergy & Precautions, NPO status , Patient's Chart, lab work & pertinent test results  History of Anesthesia Complications Negative for: history of anesthetic complications  Airway Mallampati: II       Dental   Pulmonary sleep apnea and Continuous Positive Airway Pressure Ventilation , neg COPD, former smoker,           Cardiovascular hypertension, Pt. on medications (-) Past MI and (-) CHF (-) dysrhythmias      Neuro/Psych neg Seizures CVA (temperature sensation in left arm), Residual Symptoms    GI/Hepatic Neg liver ROS, neg GERD  ,  Endo/Other  neg diabetes  Renal/GU negative Renal ROS     Musculoskeletal   Abdominal   Peds  Hematology   Anesthesia Other Findings   Reproductive/Obstetrics                            Anesthesia Physical Anesthesia Plan  ASA: III  Anesthesia Plan: General   Post-op Pain Management:    Induction: Intravenous  PONV Risk Score and Plan:   Airway Management Planned: Oral ETT, Double Lumen EBT and Video Laryngoscope Planned  Additional Equipment:   Intra-op Plan:   Post-operative Plan:   Informed Consent: I have reviewed the patients History and Physical, chart, labs and discussed the procedure including the risks, benefits and alternatives for the proposed anesthesia with the patient or authorized representative who has indicated his/her understanding and acceptance.     Plan Discussed with:   Anesthesia Plan Comments:         Anesthesia Quick Evaluation

## 2018-02-23 NOTE — Addendum Note (Signed)
Addendum  created 02/23/18 1724 by Doreen Salvage, CRNA   Charge Capture section accepted

## 2018-02-23 NOTE — Anesthesia Post-op Follow-up Note (Signed)
Anesthesia QCDR form completed.        

## 2018-02-23 NOTE — Progress Notes (Signed)
  Oncology Nurse Navigator Documentation  Navigator Location: [facility] (02/23/18 1100)   )Navigator Encounter Type: Other (02/23/18 1100)   Abnormal Finding Date: 02/21/18 (02/23/18 1100)                 Patient Visit Type: Inpatient (02/23/18 1100) Treatment Phase: Abnormal Scans (02/23/18 1100) Barriers/Navigation Needs: Coordination of Care (02/23/18 1100)   Interventions: Coordination of Care (02/23/18 1100)   Coordination of Care: Appts (02/23/18 1100)        Acuity: Level 2 (02/23/18 1100)   Acuity Level 2: Initial guidance, education and coordination as needed;Educational needs;Assistance expediting appointments (02/23/18 1100)  met with patient and family while admitted. Pt introduced to navigator services. At the time of visit, pt did not have any questions. States he is waiting for his pathology results before asking any questions. Pt and family given contact info and encouraged to call if has any questions. Nothing further needed at this time. Will continue to follow while admitted and help coordinate appts once discharged.   Time Spent with Patient: 30 (02/23/18 1100)

## 2018-02-23 NOTE — Progress Notes (Signed)
Pt received from PACU in no distress. Chest tube hooked to low continuous suction at 20 cm. Patient in no distress. Total of 250 ml since patient back on floor. Dressings dry and intact. Ammie Dalton, RN

## 2018-02-23 NOTE — Progress Notes (Signed)
Pt states he feels like his lungs are congested. On auscultation pt lungs noted to be congested. Dr. Amie Critchley notified. Acknowledged. Orders received. Dream Nodal E 4:33 PM 02/23/2018

## 2018-02-23 NOTE — Progress Notes (Signed)
[facility] [place] NAME: Ruben Eaton    MR#:  [id number]  DATE OF BIRTH:  1970-12-29  SUBJECTIVE:  CHIEF COMPLAINT:   Chief Complaint  Patient presents with  . Shortness of Breath    R sided Pneumothorax   Patient without complaint, family at the bedside REVIEW OF SYSTEMS:  CONSTITUTIONAL: No fever, fatigue or weakness.  EYES: No blurred or double vision.  EARS, NOSE, AND THROAT: No tinnitus or ear pain.  RESPIRATORY: No cough, shortness of breath, wheezing or hemoptysis.  CARDIOVASCULAR: No chest pain, orthopnea, edema.  GASTROINTESTINAL: No nausea, vomiting, diarrhea or abdominal pain.  GENITOURINARY: No dysuria, hematuria.  ENDOCRINE: No polyuria, nocturia,  HEMATOLOGY: No anemia, easy bruising or bleeding SKIN: No rash or lesion. MUSCULOSKELETAL: No joint pain or arthritis.   NEUROLOGIC: No tingling, numbness, weakness.  PSYCHIATRY: No anxiety or depression.   ROS  DRUG ALLERGIES:  No Known Allergies  VITALS:  Blood pressure 116/66, pulse 99, temperature 98.4 F (36.9 C), temperature source Oral, resp. rate 18, height 5\' 6"  (1.676 m), weight 79.7 kg (175 lb 12.8 oz), SpO2 94 %.  PHYSICAL EXAMINATION:  GENERAL:  47 y.o.-year-old patient lying in the bed with no acute distress.  EYES: Pupils equal, round, reactive to light and accommodation. No scleral icterus. Extraocular muscles intact.  HEENT: Head atraumatic, normocephalic. Oropharynx and nasopharynx clear.  NECK:  Supple, no jugular venous distention. No thyroid enlargement, no tenderness.  LUNGS: Normal breath sounds bilaterally, no wheezing, rales,rhonchi or crepitation. No use of accessory muscles of respiration.  CARDIOVASCULAR: S1, S2 normal. No murmurs, rubs, or gallops.  ABDOMEN: Soft, nontender, nondistended. Bowel sounds present. No organomegaly or mass.  EXTREMITIES: No pedal edema, cyanosis, or clubbing.  NEUROLOGIC: Cranial nerves II through XII are  intact. Muscle strength 5/5 in all extremities. Sensation intact. Gait not checked.  PSYCHIATRIC: The patient is alert and oriented x 3.  SKIN: No obvious rash, lesion, or ulcer.   Physical Exam LABORATORY PANEL:   CBC Recent Labs  Lab 02/23/18 0237  WBC 8.4  HGB 13.7  HCT 38.4*  PLT 291   ------------------------------------------------------------------------------------------------------------------  Chemistries  Recent Labs  Lab 02/23/18 0237  NA 131*  K 4.0  CL 99*  CO2 24  GLUCOSE 107*  BUN 17  CREATININE 0.79  CALCIUM 8.4*  AST 24  ALT 20  ALKPHOS 54  BILITOT 0.4   ------------------------------------------------------------------------------------------------------------------  Cardiac Enzymes Recent Labs  Lab 02/21/18 1602  TROPONINI <0.03   ------------------------------------------------------------------------------------------------------------------  RADIOLOGY:  Dg Chest 2 View  Result Date: 02/21/2018 CLINICAL DATA:  Shortness of breath and chest pain beginning 3 days ago. EXAM: CHEST - 2 VIEW COMPARISON:  07/21/2006 FINDINGS: Left hemithorax is clear. ASD closure device overlies the heart. There is a large right effusion with near complete collapse of the right lung. Partial aeration of the right upper lobe. Mediastinum bows towards the left. No significant bone finding. IMPRESSION: Large right effusion with near complete collapse of the right lung. Partial aeration of the right upper lobe. Mediastinal shift towards the left. Left chest otherwise clear. ASD closure device. Electronically Signed   By: Nelson Chimes M.D.   On: 02/21/2018 16:20   Ct Chest W Contrast  Result Date: 02/21/2018 CLINICAL DATA:  Patient with shortness of breath. History of bronchitis. Large right pleural effusion. EXAM: CT CHEST WITH CONTRAST TECHNIQUE: Multidetector CT imaging of the chest was performed during intravenous contrast administration. CONTRAST:  51mL ISOVUE-370  IOPAMIDOL (  ISOVUE-370) INJECTION 76% COMPARISON:  Chest radiograph 02/21/2018 FINDINGS: Cardiovascular: Normal heart size. Trace fluid superior pericardial recess. Normal caliber aorta and main pulmonary artery. Mediastinum/Nodes: Leftward mediastinal shift secondary to large right pleural effusion. Enlarged mediastinal lymph nodes are demonstrated including a 1.3 cm prevascular lymph node (image 55; series 2). There is a 0.8 cm right paratracheal lymph node (image 52; series 2). Esophagus is poorly visualized. Lungs/Pleura: Mild leftward shift of the tracheal air column. There is a 6 mm nodule within the left lower lobe (image 121; series 3). There is a 6 mm left lower lobe nodule (image 79; series 3). There is a 5 mm nodule in the lingula (image 55; series 3). There is a 3 mm nodule within the left upper lobe (image 41; series 3). There is a large right pleural effusion with near total collapse of the right lung. There is minimal aeration of the right upper lobe. Multiple enhancing predominantly pleural-based nodules are demonstrated throughout the right hemithorax. There is a more dominant pleural-based nodule demonstrated within the anterior right upper hemithorax which measures up to 7 cm. Upper Abdomen: Unremarkable. Musculoskeletal: Thoracic spine degenerative changes. No aggressive or acute appearing osseous lesions. IMPRESSION: 1. Multiple enhancing pleural-based nodules are demonstrated throughout the right hemithorax with large right pleural effusion most concerning for pleural-based malignancy/metastatic disease. There is a more dominant pleural-based mass within the anterior right hemithorax. 2. Multiple enlarged mediastinal lymph nodes concerning for nodal metastatic disease. 3. Multiple nodules within the left lung concerning for the possibility of pulmonary metastatic disease. 4. These results were called by telephone at the time of interpretation on 02/21/2018 at 5:58 pm. Electronically Signed   By:  Lovey Newcomer M.D.   On: 02/21/2018 18:00   Dg Chest Port 1 View  Result Date: 02/22/2018 CLINICAL DATA:  Post right-sided thoracentesis EXAM: PORTABLE CHEST 1 VIEW COMPARISON:  02/21/2018; chest CT-02/21/2018 FINDINGS: Interval reduction in persistent moderate-sized right-sided effusion post thoracentesis. No pneumothorax. Slight reduction in previously noted right to left mediastinal shift. Otherwise, unchanged cardiac silhouette and mediastinal contours with persistent obscuration of the right heart border secondary to right basilar consolidative opacities. Post PFO ligation. No new focal airspace opacities. The left hemithorax remains well aerated. No acute osseus abnormalities. IMPRESSION: Interval reduction in persistent moderate-sized right-sided effusion post thoracentesis. No pneumothorax. Electronically Signed   By: Sandi Mariscal M.D.   On: 02/22/2018 10:41   US Thoracentesis Asp Pleural Space W/img Guide  Result Date: 02/22/2018 INDICATION: No known primary, now with multiple pleural based masses and symptomatic right-sided pleural effusion. Please perform ultrasound-guided thoracentesis for diagnostic and therapeutic purposes. EXAM: US THORACENTESIS ASP PLEURAL SPACE W/IMG GUIDE COMPARISON:  Chest radiograph - 02/21/2018; chest CT - 02/21/2018 MEDICATIONS: None. COMPLICATIONS: None immediate. TECHNIQUE: Informed written consent was obtained from the patient after a discussion of the risks, benefits and alternatives to treatment. A timeout was performed prior to the initiation of the procedure. Initial ultrasound scanning demonstrates a large anechoic right-sided pleural effusion. The lower chest was prepped and draped in the usual sterile fashion. 1% lidocaine was used for local anesthesia. An ultrasound image was saved for documentation purposes. An 8 Fr Safe-T-Centesis catheter was introduced. The thoracentesis was performed. The catheter was removed and a dressing was applied. The patient  tolerated the procedure well without immediate post procedural complication. The patient was escorted to have an upright chest radiograph. FINDINGS: A total of approximately 2 liters of serous, slightly blood tinged fluid was removed. Requested samples were sent  to the laboratory. IMPRESSION: Successful ultrasound-guided right sided thoracentesis yielding 2 liters of pleural fluid. Electronically Signed   By: Sandi Mariscal M.D.   On: 02/22/2018 11:06    ASSESSMENT AND PLAN:  47 year old male patient with history of hypertension, previous stroke found to have PFO came in because of worsening shortness of breath for the past 3 to 5 days  *Acute right probable malignant pleural effusion CT chest noted for lung mass Status post thoracentesis Feb 22, 2018 with 2 L of bloody fluid removed - f/u on cytology Pulmonology, oncology inputs appreciated  For VATS, chest tube placement, Pleurx catheter placement, possible Mediport, and pleurodesis later today by cardiothoracic surgery/Dr. Faith Rogue  *Acute right lung mass Suspect due to neoplasm Plan of care as stated above  *Chronic benign essential hypertension Stable on current regiment  *Acute hyponatremia, hypochloremia Resolving with IV fluids for rehydration Cannot rule out possible SIADH secondary to above  *Asymptomatic sinus bradycardia Resolved Likely secondary to regular exercise  *General anxiety disorder, unspecified Stable  Continue Ativan as needed     All the records are reviewed and case discussed with Care Management/Social Workerr. Management plans discussed with the patient, family and they are in agreement.  CODE STATUS: full  TOTAL TIME TAKING CARE OF THIS PATIENT: 35 minutes.     POSSIBLE D/C IN 3-4 DAYS, DEPENDING ON CLINICAL CONDITION.   Avel Peace Malley Hauter M.D on 02/23/2018   Between 7am to 6pm - Pager - [phone]  After 6pm go to [email or web address] - password [email or web address] [facility]  Office   [phone]  CC: Primary care physician; Dion Body, MD  Note: This dictation was prepared with Dragon dictation along with smaller phrase technology. Any transcriptional errors that result from this process are unintentional.

## 2018-02-23 NOTE — Transfer of Care (Signed)
Immediate Anesthesia Transfer of Care Note  Patient: Ruben Eaton  Procedure(s) Performed: VIDEO ASSISTED THORACOSCOPY (VATS) W/TALC PLEUADESIS (Right ) INSERTION PORT-A-CATH (N/A )  Patient Location: PACU  Anesthesia Type:General  Level of Consciousness: awake, alert  and sedated  Airway & Oxygen Therapy: Patient Spontanous Breathing and Patient connected to face mask oxygen  Post-op Assessment: Report given to RN and Post -op Vital signs reviewed and stable  Post vital signs: Reviewed and stable  Last Vitals:  Vitals Value Taken Time  BP 125/63 02/23/2018  3:33 PM  Temp 36.3 C 02/23/2018  3:33 PM  Pulse 83 02/23/2018  3:34 PM  Resp 15 02/23/2018  3:34 PM  SpO2 99 % 02/23/2018  3:34 PM  Vitals shown include unvalidated device data.  Last Pain:  Vitals:   02/23/18 1151  TempSrc: Tympanic  PainSc: 0-No pain         Complications: No apparent anesthesia complications

## 2018-02-23 NOTE — Op Note (Signed)
02/23/2018  3:40 PM  PATIENT:  Ruben Eaton  47 y.o. male  PRE-OPERATIVE DIAGNOSIS: Malignant pleural effusion  POST-OPERATIVE DIAGNOSIS: Malignant pleural effusion  PROCEDURE: #1 preoperative bronchoscopy to assess endobronchial anatomy #2 right thoracoscopy with talc pleurodesis and pleural biopsy #3 insertion of Pleurx catheter #4 insertion of Port-A-Cath using ultrasound and vascular guidance  SURGEON:  Surgeon(s) and Role:    Nestor Lewandowsky, MD - Primary  ASSISTANTS: None  ANESTHESIA: General  INDICATIONS FOR PROCEDURE this patient is a 47 year old gentleman who presented with a large right-sided pleural effusion and shortness of breath.  Evaluation was highly suspicious for a lung mass with pleural metastases and he was offered the above named procedure for definitive diagnosis and therapy.  DICTATION: The patient was brought to the operating suite and placed in supine position.  General endotracheal anesthesia was given through a double-lumen tube.  Preoperative bronchoscopy was carried out.  This was performed because findings at the time of bronchoscopy may have modified our subsequent procedures.  I inserted the bronchoscope and reviewed the anatomy of the right lung.  There was no evidence of endobronchial tumor.  The bronchus intermedius and lower lobe bronchi were all extrinsically compressed but there was no endobronchial component to the major airways.  We therefore elected to proceed on.  We turned the patient for a right thoracoscopy.  All pressure points were carefully padded.  Patient was prepped and draped in usual sterile fashion.  A 20 mm port site was created in approximately the seventh intercostal space in the anterior axillary line.  The incision was deepened down through the muscles of the chest wall until the pleural space was entered.  There is approximately 3.0 liters of serosanguineous fluid present within the pleural space.  This was suctioned dry and a  thoracoscope was introduced.  There were extensive pleural implants scattered throughout the entire pleural space.  These were concentrated posteriorly and inferiorly.  In addition the midportion of the right lung was adherent to the chest wall and appeared to be growing into the chest wall at that point.  We suctioned the entire hemithorax dry and then placed 4 g of sterile talc under direct visualization cutting the entire pleural space.  We then inserted a Pleurx catheter through a separate stab wound and brought this out just anterior to our thoracoscopy port.  Then through the thoracoscopy port I placed a 14 French chest tube and brought it out through a separate stab wound as well.  These were all secured with nylon sutures.  The wounds were then closed in multiple layers using running absorbable sutures on the deeper tissues and nylon for the skin.  All instruments and needle counts were correct as reported to me.  Sterile dressings were applied.  Patient was then turned in the supine position.  The patient was then reprepped and draped in usual sterile fashion.  An ultrasound machine was used to identify the right internal jugular vein.  It was suitable for cannulation.  The right internal jugular vein was percutaneously catheterized and under fluoroscopic guidance a wire was introduced into the right side of the heart.  We then created a port site on the anterior chest wall.  The Port-A-Cath was then tunneled from the port site up to the neck incision and it was then inserted through the peel-away sheath into the superior vena cava right atrial junction.  Catheter was then irrigated and flushed and trimmed to appropriate length.  It was then connected  to the Port-A-Cath.  Port-A-Cath was then secured in a 3 quadrant fashion to the anterior pectoralis fascia.  The wounds were dry and the catheter irrigated and flushed again nicely.  Fluoroscopy confirmed the catheter to be in good position.  The wounds  were then closed with multiple layers running absorbable sutures on the deeper tissues and nylon on the skin.  Sterile dressings were applied.  All sponge needle and instrument counts were correct as reported to me at the end of the case.  The patient was then extubated and taken to the recovery room in stable condition.   Nestor Lewandowsky, MD

## 2018-02-23 NOTE — Anesthesia Postprocedure Evaluation (Signed)
Anesthesia Post Note  Patient: TAVARIUS GREWE  Procedure(s) Performed: VIDEO ASSISTED THORACOSCOPY (VATS) W/TALC PLEUADESIS (Right ) INSERTION PORT-A-CATH (N/A )  Patient location during evaluation: PACU Anesthesia Type: General Level of consciousness: awake and alert Pain management: pain level controlled Vital Signs Assessment: post-procedure vital signs reviewed and stable Respiratory status: spontaneous breathing, nonlabored ventilation, respiratory function stable and patient connected to nasal cannula oxygen Cardiovascular status: blood pressure returned to baseline and stable Postop Assessment: no apparent nausea or vomiting Anesthetic complications: no     Last Vitals:  Vitals:   02/23/18 1623 02/23/18 1633  BP:  135/73  Pulse: 64 98  Resp: (!) 22 16  Temp:  (!) 36.4 C  SpO2: 91% 98%    Last Pain:  Vitals:   02/23/18 1633  TempSrc:   PainSc: 2                  Precious Haws Wilsie Kern

## 2018-02-23 NOTE — Interval H&P Note (Signed)
History and Physical Interval Note:  02/23/2018 12:06 PM  Ruben Eaton  has presented today for surgery, with the diagnosis of n/a  The various methods of treatment have been discussed with the patient and family. After consideration of risks, benefits and other options for treatment, the patient has consented to  Procedure(s): VIDEO ASSISTED THORACOSCOPY (VATS) W/TALC PLEUADESIS (Right) INSERTION PORT-A-CATH (N/A) as a surgical intervention .  The patient's history has been reviewed, patient examined, no change in status, stable for surgery.  I have reviewed the patient's chart and labs.  Questions were answered to the patient's satisfaction.     Nestor Lewandowsky

## 2018-02-23 NOTE — Anesthesia Procedure Notes (Signed)
Procedure Name: Intubation Date/Time: 02/23/2018 12:40 PM Performed by: Nelda Marseille, CRNA Pre-anesthesia Checklist: Patient identified, Patient being monitored, Timeout performed, Emergency Drugs available and Suction available Patient Re-evaluated:Patient Re-evaluated prior to induction Oxygen Delivery Method: Circle system utilized Preoxygenation: Pre-oxygenation with 100% oxygen Induction Type: IV induction Ventilation: Mask ventilation without difficulty Laryngoscope Size: Mac, 3 and McGraph Grade View: Grade II Tube type: Oral Endobronchial tube: Double lumen EBT, EBT position confirmed by fiberoptic bronchoscope and Left and 39 Fr Tube size: 7.0 mm Number of attempts: 1 Airway Equipment and Method: Stylet and Video-laryngoscopy Placement Confirmation: ETT inserted through vocal cords under direct vision,  positive ETCO2 and breath sounds checked- equal and bilateral Tube secured with: Tape Dental Injury: Teeth and Oropharynx as per pre-operative assessment  Difficulty Due To: Difficulty was anticipated

## 2018-02-24 ENCOUNTER — Inpatient Hospital Stay: Payer: No Typology Code available for payment source

## 2018-02-24 DIAGNOSIS — J910 Malignant pleural effusion: Secondary | ICD-10-CM

## 2018-02-24 DIAGNOSIS — Z9889 Other specified postprocedural states: Secondary | ICD-10-CM

## 2018-02-24 DIAGNOSIS — C3491 Malignant neoplasm of unspecified part of right bronchus or lung: Principal | ICD-10-CM

## 2018-02-24 LAB — ACID FAST SMEAR (AFB): ACID FAST SMEAR - AFSCU2: NEGATIVE

## 2018-02-24 LAB — ACID FAST SMEAR (AFB, MYCOBACTERIA)

## 2018-02-24 MED ORDER — GADOBENATE DIMEGLUMINE 529 MG/ML IV SOLN
15.0000 mL | Freq: Once | INTRAVENOUS | Status: AC | PRN
Start: 2018-02-24 — End: 2018-02-24
  Administered 2018-02-24: 15 mL via INTRAVENOUS

## 2018-02-24 MED ORDER — DOCUSATE SODIUM 100 MG PO CAPS: 200.0000 mg | ORAL_CAPSULE | Freq: Two times a day (BID) | ORAL | Status: DC

## 2018-02-24 MED ORDER — ALBUTEROL SULFATE (2.5 MG/3ML) 0.083% IN NEBU: 2.5000 mg | INHALATION_SOLUTION | Freq: Four times a day (QID) | RESPIRATORY_TRACT | Status: DC | PRN

## 2018-02-24 MED ORDER — DOCUSATE SODIUM 100 MG PO CAPS
200.0000 mg | ORAL_CAPSULE | Freq: Two times a day (BID) | ORAL | Status: DC | PRN
  Administered 2018-02-24: 23:00:00 100 mg via ORAL
  Administered 2018-02-26: 200 mg via ORAL
  Filled 2018-02-24 (×2): qty 2

## 2018-02-24 NOTE — Progress Notes (Signed)
Pt dressing changed in sterile fashion per Dr. Sabra Heck instructions. Gauze was saturated 90% with sanguineous drainage.  Kyla Balzarine, LPN

## 2018-02-24 NOTE — Progress Notes (Signed)
[facility] Hospital Day(s): 3.   Post op day(s): 1 Day Post-Op.   Interval History: Patient seen and examined, no acute events or new complaints overnight. Patient reports he feels well with much improved breathing in comparison to pre-surgery, denies significant peri-incisional or peri-tube pain, fever/chills, or N/V and has been eating with an improved appetite and walking in the halls. 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 Chest tube dressing changed overnight by RN due to saturation, has not since even stained dressing.  Review of Systems:  Constitutional: denies fever, chills  Respiratory: denies any shortness of breath as per interval history Cardiovascular: denies chest pain or palpitations as per interval history Musculoskeletal: denies pain, decreased motor or sensation Integumentary: denies any other rashes or skin discolorations except post-surgical wounds as per interval history  Vital signs in last 24 hours: [min-max] current  Temp:  [97.4 F (36.3 C)-98.1 F (36.7 C)] 97.7 F (36.5 C) (06/01 0557) Pulse Rate:  [44-106] 44 (06/01 0557) Resp:  [10-25] 20 (05/31 1813) BP: (107-135)/(55-110) 123/67 (06/01 0557) SpO2:  [91 %-99 %] 99 % (06/01 0557)     Height: 5\' 6"  (167.6 cm) Weight: 175 lb 12.8 oz (79.7 kg) BMI (Calculated): 28.39   Intake/Output this shift:  No intake/output data recorded.   Intake/Output last 2 shifts:  @IOLAST2SHIFTS @   Physical Exam:  Constitutional: alert, cooperative and no distress  Respiratory: breathing non-labored at rest, small chest tube air leak with cough, chest tube well-secured and to suction, dressing c/d/i Cardiovascular: regular rate and sinus rhythm   Labs:  CBC Latest Ref Rng & Units 02/23/2018 02/22/2018 02/21/2018  WBC 3.8 - 10.6 K/uL 8.4 8.9 9.9  Hemoglobin 13.0 - 18.0 g/dL 13.7 13.7 14.5   Hematocrit 40.0 - 52.0 % 38.4(L) 38.9(L) 41.8  Platelets 150 - 440 K/uL 291 290 307   CMP Latest Ref Rng & Units 02/23/2018 02/22/2018 02/21/2018  Glucose 65 - 99 mg/dL 107(H) 119(H) -  BUN 6 - 20 mg/dL 17 15 -  Creatinine 0.61 - 1.24 mg/dL 0.79 0.94 0.89  Sodium 135 - 145 mmol/L 131(L) 128(L) -  Potassium 3.5 - 5.1 mmol/L 4.0 4.6 -  Chloride 101 - 111 mmol/L 99(L) 93(L) -  CO2 22 - 32 mmol/L 24 25 -  Calcium 8.9 - 10.3 mg/dL 8.4(L) 8.7(L) -  Total Protein 6.5 - 8.1 g/dL 5.8(L) - -  Total Bilirubin 0.3 - 1.2 mg/dL 0.4 - -  Alkaline Phos 38 - 126 U/L 54 - -  AST 15 - 41 U/L 24 - -  ALT 17 - 63 U/L 20 - -   Imaging studies: No new pertinent imaging studies   Assessment/Plan: 47 y.o. male doing well 1 Day Post-Op s/p bronchoscopy and thoracoscopy with pleural biopsy, talc pleuradesis, insertion of Pleurx catheter, and insertion of central venous catheter with subcutaneous port for malignant pleural effusion.   - pain control prn   - continue chest tube to suction  - medical management of comorbidities  - regular diet, ambulation encouraged   All of the above findings and recommendations were discussed with the patient and his family, and all of patient's and family's questions were answered to their expressed satisfaction.  -- Marilynne Drivers Rosana Hoes, MD, [place]: [facility] - Partnering for exceptional care. Office: [phone]

## 2018-02-24 NOTE — Progress Notes (Signed)
[facility] [place] NAME: Ruben Eaton    MR#:  [id number]  DATE OF BIRTH:  1970/11/08  SUBJECTIVE:  CHIEF COMPLAINT:   Chief Complaint  Patient presents with  . Shortness of Breath    R sided Pneumothorax   Patient underwent talc pleurodesis, Pleurx catheter placement, Mediport placement Patient currently denies any complaints his pain is under control  REVIEW OF SYSTEMS:  CONSTITUTIONAL: No fever, fatigue or weakness.  EYES: No blurred or double vision.  EARS, NOSE, AND THROAT: No tinnitus or ear pain.  RESPIRATORY: No cough, shortness of breath, wheezing or hemoptysis.  CARDIOVASCULAR: No chest pain, orthopnea, edema.  GASTROINTESTINAL: No nausea, vomiting, diarrhea or abdominal pain.  GENITOURINARY: No dysuria, hematuria.  ENDOCRINE: No polyuria, nocturia,  HEMATOLOGY: No anemia, easy bruising or bleeding SKIN: No rash or lesion. MUSCULOSKELETAL: No joint pain or arthritis.   NEUROLOGIC: No tingling, numbness, weakness.  PSYCHIATRY: No anxiety or depression.   ROS  DRUG ALLERGIES:  No Known Allergies  VITALS:  Blood pressure 123/67, pulse (!) 44, temperature 97.7 F (36.5 C), temperature source Oral, resp. rate 20, height 5\' 6"  (1.676 m), weight 79.7 kg (175 lb 12.8 oz), SpO2 99 %.  PHYSICAL EXAMINATION:  GENERAL:  47 y.o.-year-old patient lying in the bed with no acute distress.  EYES: Pupils equal, round, reactive to light and accommodation. No scleral icterus. Extraocular muscles intact.  HEENT: Head atraumatic, normocephalic. Oropharynx and nasopharynx clear.  NECK:  Supple, no jugular venous distention. No thyroid enlargement, no tenderness.  LUNGS: Normal breath sounds bilaterally, no wheezing, rales,rhonchi or crepitation. No use of accessory muscles of respiration.  Chest tube in place  cARDIOVASCULAR: S1, S2 normal. No murmurs, rubs, or gallops.  ABDOMEN: Soft, nontender, nondistended. Bowel sounds present. No  organomegaly or mass.  EXTREMITIES: No pedal edema, cyanosis, or clubbing.  NEUROLOGIC: Cranial nerves II through XII are intact. Muscle strength 5/5 in all extremities. Sensation intact. Gait not checked.  PSYCHIATRIC: The patient is alert and oriented x 3.  SKIN: No obvious rash, lesion, or ulcer.   Physical Exam LABORATORY PANEL:   CBC Recent Labs  Lab 02/23/18 0237  WBC 8.4  HGB 13.7  HCT 38.4*  PLT 291   ------------------------------------------------------------------------------------------------------------------  Chemistries  Recent Labs  Lab 02/23/18 0237  NA 131*  K 4.0  CL 99*  CO2 24  GLUCOSE 107*  BUN 17  CREATININE 0.79  CALCIUM 8.4*  AST 24  ALT 20  ALKPHOS 54  BILITOT 0.4   ------------------------------------------------------------------------------------------------------------------  Cardiac Enzymes Recent Labs  Lab 02/21/18 1602  TROPONINI <0.03   ------------------------------------------------------------------------------------------------------------------  RADIOLOGY:  Mr Jeri Cos MW Contrast  Result Date: 02/24/2018 CLINICAL DATA:  New diagnosis lung carcinoma. EXAM: MRI HEAD WITHOUT AND WITH CONTRAST TECHNIQUE: Multiplanar, multiecho pulse sequences of the brain and surrounding structures were obtained without and with intravenous contrast. CONTRAST:  62mL MULTIHANCE GADOBENATE DIMEGLUMINE 529 MG/ML IV SOLN COMPARISON:  None. FINDINGS: Brain: Diffusion imaging does not show any acute or subacute infarction. Brainstem is normal. 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 There is old infarction in the deep insula and right posterior frontal brain. Mild chronic small-vessel changes seen elsewhere within the hemispheric white matter. There is no evidence of metastatic disease. No hemorrhage, hydrocephalus or extra-axial collection. Vascular: Major vessels at the base of the brain show flow. Skull and upper cervical spine: Negative  Sinuses/Orbits: Clear/normal Other: None IMPRESSION: No evidence of metastatic disease.  Old ischemic changes  as above. Electronically Signed   By: Nelson Chimes M.D.   On: 02/24/2018 11:58   Dg Chest Port 1 View  Result Date: 02/23/2018 CLINICAL DATA:  Postoperative chest x-ray.  Tube placements. EXAM: PORTABLE CHEST 1 VIEW COMPARISON:  02/22/2018. FINDINGS: PowerPort catheter with tip over cavoatrial junction. Two right chest tubes noted with tip over the right upper chest. Significant reduction in right-sided pleural effusion with mild residual. No pneumothorax. Atelectatic changes are noted in the right upper and lower lungs. Follow-up exams to demonstrate resolution suggested. Heart size normal. No acute bony abnormality. IMPRESSION: 1.  PowerPort catheter noted with tip at the cavoatrial junction. 2. Two right chest tubes noted. Significant reduction in right-sided pleural effusion with mild residual. Atelectatic changes noted the right upper and lower lungs. Follow-up exams demonstrate resolution suggested. Electronically Signed   By: Marcello Moores  Register   On: 02/23/2018 16:10   Dg C-arm 1-60 Min-no Report  Result Date: 02/23/2018 Fluoroscopy was utilized by the requesting physician.  No radiographic interpretation.    ASSESSMENT AND PLAN:  47 year old male patient with history of hypertension, previous stroke found to have PFO came in because of worsening shortness of breath for the past 3 to 5 days  *Acute right malignant pleural effusion CT chest noted for lung mass Status post thoracentesis Feb 22, 2018 with 2 L of bloody fluid removed - f/u on cytology Pulmonology, oncology inputs appreciated  Status post talc pleurodesis, Pleurx catheter placement, MediPort placement Lung biopsy done Removal of the chest tube per cardiothoracic surgery   *Acute right lung mass Suspect due to neoplasm Plan of care as stated above  *Chronic benign essential hypertension Stable on current  regiment  *Acute hyponatremia, hypochloremia Continue to monitor follow sodium closely  *Asymptomatic sinus bradycardia   *General anxiety disorder, unspecified Stable  Continue Ativan as needed     All the records are reviewed and case discussed with Care Management/Social Workerr. Management plans discussed with the patient, family and they are in agreement.  CODE STATUS: full  TOTAL TIME TAKING CARE OF THIS PATIENT: 35 minutes.     POSSIBLE D/C IN 3-4 DAYS, DEPENDING ON CLINICAL CONDITION.   Dustin Flock M.D on 02/24/2018   Between 7am to 6pm - Pager - [phone]  After 6pm go to [email or web address] - password [email or web address] [facility]  Office  [phone]  CC: Primary care physician; Dion Body, MD  Note: This dictation was prepared with Dragon dictation along with smaller phrase technology. Any transcriptional errors that result from this process are unintentional.

## 2018-02-24 NOTE — Progress Notes (Signed)
Ruben Eaton   DOB:Mar 11, 1971   WC#:[id number]    Subjective: Patient underwent talc pleurodesis ; Pleurx catheter placement ; Mediport placement on May 31 st.  patient shortness of breath is improved post Pleurx catheter placement; continues to complain of incisional pain.  His pain is well controlled on oxycodone/IV morphine as needed.  Patient has been getting up and moving around.  Objective:  Vitals:   02/23/18 2106 02/24/18 0557  BP: (!) 119/59 123/67  Pulse: (!) 45 (!) 44  Resp:    Temp: 98.1 F (36.7 C) 97.7 F (36.5 C)  SpO2: 92% 99%     Intake/Output Summary (Last 24 hours) at 02/24/2018 1048 Last data filed at 02/24/2018 0900 Gross per 24 hour  Intake 2530 ml  Output 5045 ml  Net -2515 ml    GENERAL Alert, no distress and comfortable.  EYES: no pallor or icterus OROPHARYNX: no thrush or ulceration. NECK: supple, no masses felt LYMPH:  no palpable lymphadenopathy in the cervical, axillary or inguinal regions LUNGS: decreased breath sounds to auscultation right compared to left and  No wheeze or crackles.  Pleurx catheter in place. HEART/CVS: regular rate & rhythm and no murmurs; No lower extremity edema ABDOMEN: abdomen soft, tender  on deep palpation. and normal bowel sounds Musculoskeletal:no cyanosis of digits and no clubbing  PSYCH: alert & oriented x 3 with fluent speech NEURO: no focal motor/sensory deficits SKIN:  no rashes or significant lesions   Labs:  Lab Results  Component Value Date   WBC 8.4 02/23/2018   HGB 13.7 02/23/2018   HCT 38.4 (L) 02/23/2018   MCV 93.8 02/23/2018   PLT 291 02/23/2018   NEUTROABS 1.9 10/05/2016    Lab Results  Component Value Date   NA 131 (L) 02/23/2018   K 4.0 02/23/2018   CL 99 (L) 02/23/2018   CO2 24 02/23/2018    Studies:  Dg Chest Port 1 View  Result Date: 02/23/2018 CLINICAL DATA:  Postoperative chest x-ray.  Tube placements. EXAM: PORTABLE CHEST 1 VIEW COMPARISON:  02/22/2018. FINDINGS: PowerPort  catheter with tip over cavoatrial junction. Two right chest tubes noted with tip over the right upper chest. Significant reduction in right-sided pleural effusion with mild residual. No pneumothorax. Atelectatic changes are noted in the right upper and lower lungs. Follow-up exams to demonstrate resolution suggested. Heart size normal. No acute bony abnormality. IMPRESSION: 1.  PowerPort catheter noted with tip at the cavoatrial junction. 2. Two right chest tubes noted. Significant reduction in right-sided pleural effusion with mild residual. Atelectatic changes noted the right upper and lower lungs. Follow-up exams demonstrate resolution suggested. Electronically Signed   By: Marcello Moores  Register   On: 02/23/2018 16:10   Dg C-arm 1-60 Min-no Report  Result Date: 02/23/2018 Fluoroscopy was utilized by the requesting physician.  No radiographic interpretation.    Assessment & Plan:   #47 year old male patient currently in the hospital for right-sided pleural effusion/right lung mass  #Right lung mass/right lung pleural effusion-status post thoracentesis; cytology positive for malignancy.  Await further pathology results further tissue obtained by VATS.  Also will order NGS testing-to help guide treatment options.  Would recommend MRI of the brain-to rule out metastasis to the brain.  Patient will need outpatient work-up with PET scan.   #Discussed at length with the patient and family-regarding the likely diagnosis of lung cancer [while awaiting final pathology]; and given the fluid positive for malignancy-stage IV.  Discussed that there are multiple treatment options-however unfortunately this cannot be  cured.  Discussed that patient's life expectancy would usually be measured in years, however patient's life expectancy will depend upon presence or absence of any targetable mutations; PDL 1 status/response to immunotherapy etc.   #Patient met with lung navigator yesterday; and we will make follow-up  appointments/outpatient PET scan once closer to discharge date.  Cammie Sickle, MD 02/24/2018  10:48 AM

## 2018-02-25 ENCOUNTER — Other Ambulatory Visit: Payer: Self-pay | Admitting: Internal Medicine

## 2018-02-25 DIAGNOSIS — C3431 Malignant neoplasm of lower lobe, right bronchus or lung: Secondary | ICD-10-CM

## 2018-02-25 HISTORY — DX: Malignant neoplasm of lower lobe, right bronchus or lung: C34.31

## 2018-02-25 LAB — BODY FLUID CULTURE: Culture: NO GROWTH

## 2018-02-25 LAB — OSMOLALITY: OSMOLALITY: 272 mosm/kg — AB (ref 275–295)

## 2018-02-25 LAB — BASIC METABOLIC PANEL
ANION GAP: 7 (ref 5–15)
BUN: 11 mg/dL (ref 6–20)
CO2: 24 mmol/L (ref 22–32)
Calcium: 7.8 mg/dL — ABNORMAL LOW (ref 8.9–10.3)
Chloride: 98 mmol/L — ABNORMAL LOW (ref 101–111)
Creatinine, Ser: 0.76 mg/dL (ref 0.61–1.24)
GLUCOSE: 100 mg/dL — AB (ref 65–99)
POTASSIUM: 4 mmol/L (ref 3.5–5.1)
SODIUM: 129 mmol/L — AB (ref 135–145)

## 2018-02-25 LAB — OSMOLALITY, URINE: Osmolality, Ur: 377 mOsm/kg (ref 300–900)

## 2018-02-25 MED ORDER — SODIUM CHLORIDE 0.9% FLUSH: 10.0000 mL | INTRAVENOUS | Status: DC | PRN

## 2018-02-25 MED ORDER — SODIUM CHLORIDE 0.9% FLUSH
3.0000 mL | INTRAVENOUS | Status: DC | PRN
  Administered 2018-02-25: 10:00:00 3 mL via INTRAVENOUS
  Filled 2018-02-25: qty 3

## 2018-02-25 MED ORDER — ALTEPLASE 2 MG IJ SOLR
2.0000 mg | Freq: Once | INTRAMUSCULAR | Status: DC | PRN
  Filled 2018-02-25: qty 2

## 2018-02-25 MED ORDER — SODIUM CHLORIDE 0.9% FLUSH
3.0000 mL | Freq: Two times a day (BID) | INTRAVENOUS | Status: DC
  Administered 2018-02-25 – 2018-02-27 (×5): 3 mL via INTRAVENOUS

## 2018-02-25 MED ORDER — HEPARIN SOD (PORK) LOCK FLUSH 100 UNIT/ML IV SOLN
500.0000 [IU] | INTRAVENOUS | Status: DC | PRN
  Filled 2018-02-25: qty 5

## 2018-02-25 MED ORDER — ANTICOAGULANT SODIUM CITRATE 4% (200MG/5ML) IV SOLN
5.0000 mL | Status: DC | PRN
  Filled 2018-02-25: qty 5

## 2018-02-25 MED ORDER — HEPARIN SOD (PORK) LOCK FLUSH 100 UNIT/ML IV SOLN
250.0000 [IU] | INTRAVENOUS | Status: DC | PRN
  Filled 2018-02-25: qty 5

## 2018-02-25 NOTE — Progress Notes (Signed)
Reports pain controlled in chest tube/pleurix catheter insertion site. Up ambulating and walked outside with wife with MD permission. Eating well with pt educated on I&O with 1567ml fluid restriction. Chaplain consult placed due to new diagnosis of cancer and feeling depressed at intervals. Afebrile today. Sodium 129 with pt and MD aware with orders modified and testing. Will plan to access port site.

## 2018-02-25 NOTE — Progress Notes (Signed)
Ruben Eaton   DOB:[birth year]   HO#:[id number]    Subjective: Patient currently doing well.  Appetite improving.  Is breathing better.  Pain well controlled.  He continues to have the chest tube in place. Objective:  Vitals:   02/25/18 0633 02/25/18 0957  BP: 115/71 119/63  Pulse: 91 (!) 31  Resp:  20  Temp: 99.4 F (37.4 C) 97.8 F (36.6 C)  SpO2: 94% 94%     Intake/Output Summary (Last 24 hours) at 02/25/2018 1059 Last data filed at 02/25/2018 1000 Gross per 24 hour  Intake -  Output 353 ml  Net -353 ml    GENERAL Alert, no distress and comfortable.  Accompanied by family. EYES: no pallor or icterus OROPHARYNX: no thrush or ulceration. NECK: supple, no masses felt LYMPH:  no palpable lymphadenopathy in the cervical, axillary or inguinal regions LUNGS: decreased breath sounds to auscultation right compared to left and  No wheeze or crackles.  Pleurx catheter in place. HEART/CVS: regular rate & rhythm and no murmurs; No lower extremity edema ABDOMEN: abdomen soft, tender  on deep palpation. and normal bowel sounds Musculoskeletal:no cyanosis of digits and no clubbing  PSYCH: alert & oriented x 3 with fluent speech NEURO: no focal motor/sensory deficits SKIN:  no rashes or significant lesions   Labs:  Lab Results  Component Value Date   WBC 8.4 02/23/2018   HGB 13.7 02/23/2018   HCT 38.4 (L) 02/23/2018   MCV 93.8 02/23/2018   PLT 291 02/23/2018   NEUTROABS 1.9 10/05/2016    Lab Results  Component Value Date   NA 129 (L) 02/25/2018   K 4.0 02/25/2018   CL 98 (L) 02/25/2018   CO2 24 02/25/2018    Studies:  Mr Jeri Cos BB Contrast  Result Date: 02/24/2018 CLINICAL DATA:  New diagnosis lung carcinoma. EXAM: MRI HEAD WITHOUT AND WITH CONTRAST TECHNIQUE: Multiplanar, multiecho pulse sequences of the brain and surrounding structures were obtained without and with intravenous contrast. CONTRAST:  9mL MULTIHANCE GADOBENATE DIMEGLUMINE 529 MG/ML IV SOLN COMPARISON:  None.  FINDINGS: Brain: Diffusion imaging does not show any acute or subacute infarction. Brainstem is normal. 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 There is old infarction in the deep insula and right posterior frontal brain. Mild chronic small-vessel changes seen elsewhere within the hemispheric white matter. There is no evidence of metastatic disease. No hemorrhage, hydrocephalus or extra-axial collection. Vascular: Major vessels at the base of the brain show flow. Skull and upper cervical spine: Negative Sinuses/Orbits: Clear/normal Other: None IMPRESSION: No evidence of metastatic disease.  Old ischemic changes as above. Electronically Signed   By: Nelson Chimes M.D.   On: 02/24/2018 11:58   Dg Chest Port 1 View  Result Date: 02/23/2018 CLINICAL DATA:  Postoperative chest x-ray.  Tube placements. EXAM: PORTABLE CHEST 1 VIEW COMPARISON:  02/22/2018. FINDINGS: PowerPort catheter with tip over cavoatrial junction. Two right chest tubes noted with tip over the right upper chest. Significant reduction in right-sided pleural effusion with mild residual. No pneumothorax. Atelectatic changes are noted in the right upper and lower lungs. Follow-up exams to demonstrate resolution suggested. Heart size normal. No acute bony abnormality. IMPRESSION: 1.  PowerPort catheter noted with tip at the cavoatrial junction. 2. Two right chest tubes noted. Significant reduction in right-sided pleural effusion with mild residual. Atelectatic changes noted the right upper and lower lungs. Follow-up exams demonstrate resolution suggested. Electronically Signed   By: Marcello Moores  Register   On: 02/23/2018 16:10   Dg C-arm  1-60 Min-no Report  Result Date: 02/23/2018 Fluoroscopy was utilized by the requesting physician.  No radiographic interpretation.    Assessment & Plan:   #47 year old male patient in the hospital with malignant pleural effusions right side  #Right-sided malignant pleural effusion/right lung mass cytology  positive for malignancy; further pathology pending.  Discussed that we will order NGS/molecular testing once the pathology is confirmed by lung cancer.  We will plan to get a PET scan likely end of the week outpatient.  MRI brain negative  #The patient and family that timing of the treatment depend upon PET scan/NGS results availability etc.   #Patient family understand that I am unavailable/in the latter part of this week; however will make arrangements to have the patient follow-up with covering physician; to go over the PET scan results/plan of care in the week of the 10th June.   Cammie Sickle, MD 02/25/2018  10:59 AM

## 2018-02-25 NOTE — Plan of Care (Signed)

## 2018-02-25 NOTE — Progress Notes (Signed)
Chaplain met with patient and wife, Lenna Sciara, and discussed depression and his prognosis. Patient spoke of feeling down then returning to normal. The patient reported that he is in a haze and waiting for test results. Chaplain engaged in an emotionally supportive conversation about bucket lists, communicating about his illness, setting boundaries, and drawing upon available resources. Chaplain identified continuing pastoral care and the [employer] as options. Patient is open to continuing conversations. Chaplain referred the patient to [facility].

## 2018-02-25 NOTE — Progress Notes (Signed)
[facility] Day(s): 4.   Post op day(s): 2 Days Post-Op.   Interval History: Patient seen and examined, no acute events or new complaints overnight. Patient continues to report breathing easier than prior to surgery and drainage of malignant effusion, though describes mild pain around chest tube site, overall well-controlled and tolerating diet and ambulation without difficulty, denies fever/chills (though describes some non-specific sweating earlier this morning), denies N/V, CP, or SOB.  Review of Systems:  Constitutional: denies fever, chills  Respiratory: shortness of breath as per interval history  Cardiovascular: chest pain as per interval history, denies palpitations  Musculoskeletal: denies pain, decreased motor or sensation Integumentary: denies any other rashes or skin discolorations except post-surgical wounds  Vital signs in last 24 hours: [min-max] current  Temp:  [98.3 F (36.8 C)-99.4 F (37.4 C)] 99.4 F (37.4 C) (06/02 0633) Pulse Rate:  [91-94] 91 (06/02 0633) Resp:  [19] 19 (06/01 2045) BP: (109-115)/(61-71) 115/71 (06/02 0633) SpO2:  [94 %-96 %] 94 % (06/02 0633)     Height: 5\' 6"  (167.6 cm) Weight: 175 lb 12.8 oz (79.7 kg) BMI (Calculated): 28.39   Intake/Output this shift:  No intake/output data recorded.   Intake/Output last 2 shifts:  @IOLAST2SHIFTS @   Physical Exam:  Constitutional: alert, cooperative and no distress  Respiratory: breathing non-labored at rest, chest tube well-secured with dressing c/d/i, no air leak appreciated Cardiovascular: regular rate and sinus rhythm  Gastrointestinal: soft, non-tender, and non-distended  Labs:  CBC Latest Ref Rng & Units 02/23/2018 02/22/2018 02/21/2018  WBC 3.8 - 10.6 K/uL 8.4 8.9 9.9  Hemoglobin 13.0 - 18.0 g/dL 13.7 13.7 14.5  Hematocrit 40.0 - 52.0 % 38.4(L) 38.9(L) 41.8  Platelets 150 - 440 K/uL 291 290 307   CMP Latest Ref Rng & Units 02/23/2018 02/22/2018 02/21/2018  Glucose 65 -  99 mg/dL 107(H) 119(H) -  BUN 6 - 20 mg/dL 17 15 -  Creatinine 0.61 - 1.24 mg/dL 0.79 0.94 0.89  Sodium 135 - 145 mmol/L 131(L) 128(L) -  Potassium 3.5 - 5.1 mmol/L 4.0 4.6 -  Chloride 101 - 111 mmol/L 99(L) 93(L) -  CO2 22 - 32 mmol/L 24 25 -  Calcium 8.9 - 10.3 mg/dL 8.4(L) 8.7(L) -  Total Protein 6.5 - 8.1 g/dL 5.8(L) - -  Total Bilirubin 0.3 - 1.2 mg/dL 0.4 - -  Alkaline Phos 38 - 126 U/L 54 - -  AST 15 - 41 U/L 24 - -  ALT 17 - 63 U/L 20 - -   Imaging studies: No new pertinent imaging studies   Assessment/Plan: 47 y.o. male doing well 2 Days Post-Op s/p bronchoscopy and thoracoscopy with pleural biopsy, talc pleuradesis, insertion of Pleurx catheter, and insertion of central venous catheter with subcutaneous port for malignant pleural effusion.              - pain control prn              - continue chest tube to suction  - will check chest x-ray and BMP tomorrow morning  - anticipate removal of surgical chest tube tomorrow, keep Pleurx             - medical management of comorbidities as per medical team (appreciated)  - outpatient follow-up with medical oncology, outpatient PET             - regular diet, ambulation encouraged  All of the above findings and recommendations were discussed with the patient and his family, and  all of patient's and family's questions were answered to their expressed satisfaction.  -- Marilynne Drivers Rosana Hoes, MD, [place]: [facility] - Partnering for exceptional care. Office: [phone]

## 2018-02-25 NOTE — Progress Notes (Signed)
Haley-please schedule a PET scan for the patient towards the end of the week.  Also send NGS/omniseq testing.  Also have the patient follow-up with 1 of the covering doctors week of June 10 to go over the results of the PET scan/plan of care.

## 2018-02-25 NOTE — Progress Notes (Signed)
Attempted to flush port.  Unable to flush.  IV team consulted. Dorna Bloom RN

## 2018-02-25 NOTE — Care Management Note (Signed)
Case Management Note  Patient Details  Name: Ruben Eaton MRN: [id number] Date of Birth: 25-May-1971  Subjective/Objective:   Admitted to [facility] with the diagnosis of right pleural effusion. Lives with wife, Ruben Eaton [phone]). Sees Ruben Eaton every 6 months. Takes care of all basic and instrumental activities of daily living himself, drives. Works full time. No falls Good appetite.   Pleux Catheter, chest tube, and port placed 02/23/18.                Action/Plan: Received referral for Home Health needs.  Unsure if Home Health will be needed at this time.  Information about [facility] given. Placed information for Dr. Genevive Bi to sign on the chart.  This information will need to to faxed to [place] and Dr. Genevive Bi signs   Expected Discharge Date:                  Expected Discharge Plan:     In-House Referral:   yes  Discharge planning Services   yes  Post Acute Care Choice:    Choice offered to:     DME Arranged:    DME Agency:     HH Arranged:    [place] Agency:     Status of Service:     If discussed at [employer] [facility] [employer], dates discussed:    Additional Comments:  Shelbie Ammons, RN MSN CCM Care Management [phone] 02/25/2018, 12:04 PM

## 2018-02-25 NOTE — Progress Notes (Signed)
[facility] [place] NAME: Ruben Eaton    MR#:  [id number]  DATE OF BIRTH:  [birth year]  SUBJECTIVE:  CHIEF COMPLAINT:   Chief Complaint  Patient presents with  . Shortness of Breath    R sided Pneumothorax   Patient this morning was very diaphoretic, sodium is little low Otherwise denies any complaints  REVIEW OF SYSTEMS:  CONSTITUTIONAL: No fever, fatigue or weakness.  EYES: No blurred or double vision.  EARS, NOSE, AND THROAT: No tinnitus or ear pain.  RESPIRATORY: No cough, shortness of breath, wheezing or hemoptysis.  CARDIOVASCULAR: No chest pain, orthopnea, edema.  GASTROINTESTINAL: No nausea, vomiting, diarrhea or abdominal pain.  GENITOURINARY: No dysuria, hematuria.  ENDOCRINE: No polyuria, nocturia,  HEMATOLOGY: No anemia, easy bruising or bleeding SKIN: No rash or lesion. MUSCULOSKELETAL: No joint pain or arthritis.   NEUROLOGIC: No tingling, numbness, weakness.  PSYCHIATRY: No anxiety or depression.   ROS  DRUG ALLERGIES:  No Known Allergies  VITALS:  Blood pressure 119/63, pulse (!) 31, temperature 97.8 F (36.6 C), temperature source Oral, resp. rate 20, height 5\' 6"  (1.676 m), weight 79.7 kg (175 lb 12.8 oz), SpO2 94 %.  PHYSICAL EXAMINATION:  GENERAL:  47 y.o.-year-old patient lying in the bed with no acute distress.  EYES: Pupils equal, round, reactive to light and accommodation. No scleral icterus. Extraocular muscles intact.  HEENT: Head atraumatic, normocephalic. Oropharynx and nasopharynx clear.  NECK:  Supple, no jugular venous distention. No thyroid enlargement, no tenderness.  LUNGS: Normal breath sounds bilaterally, no wheezing, rales,rhonchi or crepitation. No use of accessory muscles of respiration.  Chest tube in place  cARDIOVASCULAR: S1, S2 normal. No murmurs, rubs, or gallops.  ABDOMEN: Soft, nontender, nondistended. Bowel sounds present. No organomegaly or mass.  EXTREMITIES: No pedal edema,  cyanosis, or clubbing.  NEUROLOGIC: Cranial nerves II through XII are intact. Muscle strength 5/5 in all extremities. Sensation intact. Gait not checked.  PSYCHIATRIC: The patient is alert and oriented x 3.  SKIN: No obvious rash, lesion, or ulcer.   Physical Exam LABORATORY PANEL:   CBC Recent Labs  Lab 02/23/18 0237  WBC 8.4  HGB 13.7  HCT 38.4*  PLT 291   ------------------------------------------------------------------------------------------------------------------  Chemistries  Recent Labs  Lab 02/23/18 0237 02/25/18 0743  NA 131* 129*  K 4.0 4.0  CL 99* 98*  CO2 24 24  GLUCOSE 107* 100*  BUN 17 11  CREATININE 0.79 0.76  CALCIUM 8.4* 7.8*  AST 24  --   ALT 20  --   ALKPHOS 54  --   BILITOT 0.4  --    ------------------------------------------------------------------------------------------------------------------  Cardiac Enzymes Recent Labs  Lab 02/21/18 1602  TROPONINI <0.03   ------------------------------------------------------------------------------------------------------------------  RADIOLOGY:  Mr Jeri Cos Wo Contrast  Result Date: 02/24/2018 CLINICAL DATA:  New diagnosis lung carcinoma. EXAM: MRI HEAD WITHOUT AND WITH CONTRAST TECHNIQUE: Multiplanar, multiecho pulse sequences of the brain and surrounding structures were obtained without and with intravenous contrast. CONTRAST:  62mL MULTIHANCE GADOBENATE DIMEGLUMINE 529 MG/ML IV SOLN COMPARISON:  None. FINDINGS: Brain: Diffusion imaging does not show any acute or subacute infarction. Brainstem is normal. 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 There is old infarction in the deep insula and right posterior frontal brain. Mild chronic small-vessel changes seen elsewhere within the hemispheric white matter. There is no evidence of metastatic disease. No hemorrhage, hydrocephalus or extra-axial collection. Vascular: Major vessels at the base of the brain show flow. Skull and upper cervical  spine: Negative Sinuses/Orbits: Clear/normal Other: None IMPRESSION: No evidence of metastatic disease.  Old ischemic changes as above. Electronically Signed   By: Nelson Chimes M.D.   On: 02/24/2018 11:58   Dg Chest Port 1 View  Result Date: 02/23/2018 CLINICAL DATA:  Postoperative chest x-ray.  Tube placements. EXAM: PORTABLE CHEST 1 VIEW COMPARISON:  02/22/2018. FINDINGS: PowerPort catheter with tip over cavoatrial junction. Two right chest tubes noted with tip over the right upper chest. Significant reduction in right-sided pleural effusion with mild residual. No pneumothorax. Atelectatic changes are noted in the right upper and lower lungs. Follow-up exams to demonstrate resolution suggested. Heart size normal. No acute bony abnormality. IMPRESSION: 1.  PowerPort catheter noted with tip at the cavoatrial junction. 2. Two right chest tubes noted. Significant reduction in right-sided pleural effusion with mild residual. Atelectatic changes noted the right upper and lower lungs. Follow-up exams demonstrate resolution suggested. Electronically Signed   By: Marcello Moores  Register   On: 02/23/2018 16:10   Dg C-arm 1-60 Min-no Report  Result Date: 02/23/2018 Fluoroscopy was utilized by the requesting physician.  No radiographic interpretation.    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 Management plans discussed with the patient, family and they are in agreement.  CODE STATUS: full  TOTAL TIME TAKING CARE OF THIS PATIENT: 35 minutes.     POSSIBLE D/C IN 3-4 DAYS, DEPENDING ON CLINICAL CONDITION.   Dustin Flock M.D on 02/25/2018   Between 7am to 6pm - Pager - [phone]  After 6pm go to [email or web address] - password [email or web address] [facility]  Office  [phone]  CC: Primary care physician; Dion Body, MD  Note: This dictation was prepared with Dragon dictation along with smaller phrase technology. Any transcriptional errors that result from this process are unintentional.

## 2018-02-26 ENCOUNTER — Inpatient Hospital Stay: Payer: No Typology Code available for payment source

## 2018-02-26 ENCOUNTER — Encounter: Payer: Self-pay | Admitting: Cardiothoracic Surgery

## 2018-02-26 ENCOUNTER — Other Ambulatory Visit: Payer: Self-pay | Admitting: Internal Medicine

## 2018-02-26 ENCOUNTER — Inpatient Hospital Stay (HOSPITAL_COMMUNITY)
Admit: 2018-02-26 | Discharge: 2018-02-26 | Disposition: A | Discharge status: Home / Self Care | Payer: No Typology Code available for payment source | Attending: Nurse Practitioner | Admitting: Nurse Practitioner

## 2018-02-26 DIAGNOSIS — I493 Ventricular premature depolarization: Secondary | ICD-10-CM

## 2018-02-26 DIAGNOSIS — R0600 Dyspnea, unspecified: Secondary | ICD-10-CM

## 2018-02-26 LAB — COMP PANEL: LEUKEMIA/LYMPHOMA

## 2018-02-26 LAB — BASIC METABOLIC PANEL
ANION GAP: 6 (ref 5–15)
BUN: 17 mg/dL (ref 6–20)
CALCIUM: 8 mg/dL — AB (ref 8.9–10.3)
CO2: 26 mmol/L (ref 22–32)
Chloride: 101 mmol/L (ref 101–111)
Creatinine, Ser: 0.96 mg/dL (ref 0.61–1.24)
GFR calc non Af Amer: >60 mL/min (ref >60)
Glucose, Bld: 96 mg/dL (ref 65–99)
POTASSIUM: 4 mmol/L (ref 3.5–5.1)
Sodium: 133 mmol/L — ABNORMAL LOW (ref 135–145)

## 2018-02-26 LAB — TSH: TSH: 2.501 u[IU]/mL (ref 0.350–4.500)

## 2018-02-26 LAB — ECHOCARDIOGRAM COMPLETE
Height: 66 in
Weight: 2812.8 oz

## 2018-02-26 LAB — MAGNESIUM: Magnesium: 1.9 mg/dL (ref 1.7–2.4)

## 2018-02-26 MED ORDER — AMIODARONE HCL IN DEXTROSE 360-4.14 MG/200ML-% IV SOLN
30.0000 mg/h | INTRAVENOUS | Status: DC
  Filled 2018-02-26 (×2): qty 200

## 2018-02-26 MED ORDER — METOPROLOL TARTRATE 25 MG PO TABS
12.5000 mg | ORAL_TABLET | Freq: Two times a day (BID) | ORAL | Status: DC
  Administered 2018-02-26 (×2): 12.5 mg via ORAL
  Filled 2018-02-26 (×3): qty 1

## 2018-02-26 MED ORDER — PERFLUTREN LIPID MICROSPHERE
1.0000 mL | INTRAVENOUS | Status: AC | PRN
  Administered 2018-02-26: 2 mL via INTRAVENOUS
  Filled 2018-02-26: qty 10

## 2018-02-26 MED ORDER — AMIODARONE HCL IN DEXTROSE 360-4.14 MG/200ML-% IV SOLN
60.0000 mg/h | INTRAVENOUS | Status: DC
  Filled 2018-02-26: qty 200

## 2018-02-26 NOTE — Progress Notes (Addendum)
On vital check this morning, HR 32.  Rechecked with hand held pulse ox and from apical auscultation.  Heart rate between 36 and 101.  Pt reports no shortness of breath, no chest pain, no heart racing, blood pressure is 125/76 with Oxygen saturation 94% on RA. Pt had only 50 cc output from chest tube this shift. Spoke with Dr Marcille Blanco who will come and see the patient. Dorna Bloom RN

## 2018-02-26 NOTE — Progress Notes (Signed)
Chaplain followed up from previous visit. Pt received bad news that will not allow him to go home today. He seems to dealing with his diagnosis well but is concern about his wife. "She seem to handle it well but it may hit them harder later' He seems not want to be willing to talk about how he is feeling but what he thinks. He is looking forward to getting back to work. "Hopefully when ie is scheduled"      02/26/18 1100  Clinical Encounter Type  Visited With Patient  Visit Type Follow-up  Referral From Chaplain  Spiritual Encounters  Spiritual Needs Prayer

## 2018-02-26 NOTE — Progress Notes (Addendum)
Pt transferred from 1C, report received from [place]. Spoke to Murray Hodgkins, NP regarding starting amio gtt. Per Murray Hodgkins hold on starting Amio drip.Pt A&OX4, NSR w/ bigemeny on tele HR low 100's.VSS. Will continue to monitor.

## 2018-02-26 NOTE — Care Management (Addendum)
Patient transferred to 2A from 1C today.  There is an unsigned pleurx cath referral in the paper record.  CM reaching out physicians requesting signature.  Contacted supply chain and left voicemail regarding need for bottles to send home with patient.  Reached out to [facility] to determine if patient's insurance is in network with this company agency unable to give answer.

## 2018-02-26 NOTE — Progress Notes (Signed)
Request for [facility] testing faxed to pathology to send out block

## 2018-02-26 NOTE — Consult Note (Signed)
Cardiology Consult    Patient ID: Ruben Eaton MRN: [id number], DOB/AGE: 47-Jul-1972   Admit date: 02/21/2018 Date of Consult: 02/26/2018  Primary Physician: Dion Body, MD Primary Cardiologist: [name], MD Requesting Provider: Chauncey Cruel. Posey Pronto, MD  Patient Profile    Ruben Eaton is a 47 y.o. male with a history of cryptogenic stroke (08/2016) in setting of PFO (s/p closure 09/2016), HTN, OSA, ED, and remote tob abuse, who was admitted with progressive dyspnea and finding of large right pleural effusion in the setting of a large right pleural mass and mediastinal lymph nodes, is being seen today for the evaluation of PVC's at the request of Dr. Posey Pronto.  Past Medical History   Past Medical History:  Diagnosis Date  . Cryptogenic stroke ([place]) 08/2016   a. 09/15/2016 in setting of PFO.  . ED (erectile dysfunction)   . Hx of insomnia   . Hypertension   . OSA on CPAP   . PFO (patent foramen ovale)    a. s/p closure 09/2016 by Dr. [name].    Past Surgical History:  Procedure Laterality Date  . APPENDECTOMY  1980  . EYE SURGERY    . PATENT FORAMEN OVALE CLOSURE  10/07/2016  . PATENT FORAMEN OVALE CLOSURE N/A 10/07/2016   Procedure: Patent [place](PFO) Closure;  Surgeon: [name], MD;  Location: [facility];  Service: Cardiovascular;  Laterality: N/A;  . PORTACATH PLACEMENT N/A 02/23/2018   Procedure: INSERTION PORT-A-CATH;  Surgeon: Nestor Lewandowsky, MD;  Location: [facility];  Service: Thoracic;  Laterality: N/A;  . STRABISMUS SURGERY Left 1984  . TEE WITHOUT CARDIOVERSION N/A 09/08/2016   Procedure: TRANSESOPHAGEAL ECHOCARDIOGRAM (TEE);  Surgeon: [name], MD;  Location: [facility];  Service: Cardiovascular;  Laterality: N/A;  . VIDEO ASSISTED THORACOSCOPY (VATS) W/TALC PLEUADESIS Right 02/23/2018   Procedure: VIDEO ASSISTED THORACOSCOPY (VATS) W/TALC PLEUADESIS;  Surgeon: Nestor Lewandowsky, MD;  Location: [facility];  Service: Thoracic;  Laterality: Right;   . VITRECTOMY Right 2013     Allergies  No Known Allergies  History of Present Illness    47 year old male with the above complex past medical history including cryptogenic stroke in December 2017 in the setting of a patent foramen ovale, status post closure in January 2018; hypertension, sleep apnea, insomnia, and erectile dysfunction.  He lives locally with his wife and is very active, exercising/running at least 3 days/week.  He had recently been experiencing dyspnea and cough and was treated for bronchitis as an outpatient.  Unfortunately, symptoms did not improve any also developed right-sided pleuritic chest discomfort prompting presentation to his primary care provider.  A chest x-ray was performed showing a right pleural effusion.  He was sent to [facility] for admission.  Following admission, CT of the chest showed multiple enhancing pleural-based nodules throughout the right hemithorax with a large right pleural effusion.  Findings are most concerning for pleural-based malignancy/metastatic disease.  There are also multiple enlarged mediastinal lymph nodes concerning for nodal metastatic disease and multiple nodules within the left lung concerning for possibility of pulmonary metastatic disease.  Patient was admitted and initially underwent ultrasound-guided thoracentesis with removal of 2 L of blood-tinged pleural fluid.  With this, he had improvement in dyspnea.  He was seen by oncology and also thoracic surgery.  He subsequently underwent pleural biopsy with talc pleurodesis and Pleurx catheter insertion on May 31.  Chest tube was just from  removed this morning.  Patient was noted earlier this morning to have fluctuations in  heart rate based on pulse oximeter.  Rates as low as 36 and as high as 101 were noted.  For that reason, he was transferred to telemetry where he is been found to have sinus rhythm with frequent PVCs including bigeminy and trigeminy.  Admission EKG from May 29  actually shows sinus with multiple PVCs.  Patient notes some palpitations but is relatively asymptomatic.  He says he has a history of PVCs and that they are currently increased in frequency.  He denies any chest pain, presyncope, or syncope.  Inpatient Medications    . metoprolol tartrate  12.5 mg Oral BID  . sodium chloride flush  3 mL Intravenous Q12H  . traMADol  50-100 mg Oral Q6H    Family History    History reviewed. No pertinent family history. indicated that his mother is alive. No premature CAD.  Social History    Social History   Socioeconomic History  . Marital status: Married    Spouse name: Not on file  . Number of children: Not on file  . Years of education: Not on file  . Highest education level: Not on file  Occupational History  . Occupation: Software engineer  Social Needs  . Financial resource strain: Not on file  . Food insecurity:    Worry: Not on file    Inability: Not on file  . Transportation needs:    Medical: Not on file    Non-medical: Not on file  Tobacco Use  . Smoking status: Former Smoker    Packs/day: 0.50    Years: 10.00    Pack years: 5.00    Last attempt to quit: 2011    Years since quitting: 8.4  . Smokeless tobacco: Never Used  Substance and Sexual Activity  . Alcohol use: Yes    Alcohol/week: 1.8 oz    Types: 3 Cans of beer per week  . Drug use: No  . Sexual activity: Yes  Lifestyle  . Physical activity:    Days per week: Not on file    Minutes per session: Not on file  . Stress: Not on file  Relationships  . Social connections:    Talks on phone: Not on file    Gets together: Not on file    Attends religious service: Not on file    Active member of club or organization: Not on file    Attends meetings of clubs or organizations: Not on file    Relationship status: Not on file  . Intimate partner violence:    Fear of current or ex partner: Not on file    Emotionally abused: Not on file    Physically abused: Not on file     Forced sexual activity: Not on file  Other Topics Concern  . Not on file  Social History Narrative  . Not on file     Review of Systems    General:  No chills, fever, night sweats or weight changes.  Cardiovascular:  +++ pleuritic chest pain, +++ dyspnea on exertion, no edema, orthopnea, +++ palpitations, no paroxysmal nocturnal dyspnea. Dermatological: No rash, lesions/masses Respiratory: No cough, dyspnea Urologic: No hematuria, dysuria Abdominal:   No nausea, vomiting, diarrhea, bright red blood per rectum, melena, or hematemesis Neurologic:  No visual changes, wkns, changes in mental status. All other systems reviewed and are otherwise negative except as noted above.  Physical Exam    Blood pressure (!) 123/57, pulse 94, temperature 98.4 F (36.9 C), temperature source Oral, resp. rate 20, height  5\' 6"  (1.676 m), weight 175 lb 12.8 oz (79.7 kg), SpO2 98 %.  General: Pleasant, NAD Psych: Normal affect. Neuro: Alert and oriented X 3. Moves all extremities spontaneously. HEENT: Normal  Neck: Supple without bruits or JVD. Lungs:  Resp regular and unlabored, markedly diminished breath sounds throughout right lung. Heart: Irreg, freq ectopy. no s3, s4, or murmurs. Abdomen: Soft, non-tender, non-distended, BS + x 4.  Extremities: No clubbing, cyanosis or edema. DP/PT/Radials 2+ and equal bilaterally.  Labs     Lab Results  Component Value Date   WBC 8.4 02/23/2018   HGB 13.7 02/23/2018   HCT 38.4 (L) 02/23/2018   MCV 93.8 02/23/2018   PLT 291 02/23/2018    Recent Labs  Lab 02/23/18 0237  02/26/18 0615  NA 131*   < > 133*  K 4.0   < > 4.0  CL 99*   < > 101  CO2 24   < > 26  BUN 17   < > 17  CREATININE 0.79   < > 0.96  CALCIUM 8.4*   < > 8.0*  PROT 5.8*  --   --   BILITOT 0.4  --   --   ALKPHOS 54  --   --   ALT 20  --   --   AST 24  --   --   GLUCOSE 107*   < > 96   < > = values in this interval not displayed.   Lab Results  Component Value Date   CHOL 228  (H) 09/07/2016   HDL 108 09/07/2016   LDLCALC 111 (H) 09/07/2016   TRIG 44 09/07/2016  But in clinic   Radiology Studies    Dg Chest 2 View  Result Date: 02/21/2018 CLINICAL DATA:  Shortness of breath and chest pain beginning 3 days ago. EXAM: CHEST - 2 VIEW COMPARISON:  07/21/2006 FINDINGS: Left hemithorax is clear. ASD closure device overlies the heart. There is a large right effusion with near complete collapse of the right lung. Partial aeration of the right upper lobe. Mediastinum bows towards the left. No significant bone finding. IMPRESSION: Large right effusion with near complete collapse of the right lung. Partial aeration of the right upper lobe. Mediastinal shift towards the left. Left chest otherwise clear. ASD closure device. Electronically Signed   By: Nelson Chimes M.D.   On: 02/21/2018 16:20   Ct Chest W Contrast  Result Date: 02/21/2018 CLINICAL DATA:  Patient with shortness of breath. History of bronchitis. Large right pleural effusion. EXAM: CT CHEST WITH CONTRAST TECHNIQUE: Multidetector CT imaging of the chest was performed during intravenous contrast administration. CONTRAST:  3mL ISOVUE-370 IOPAMIDOL (ISOVUE-370) INJECTION 76% COMPARISON:  Chest radiograph 02/21/2018 FINDINGS: Cardiovascular: Normal heart size. Trace fluid superior pericardial recess. Normal caliber aorta and main pulmonary artery. Mediastinum/Nodes: Leftward mediastinal shift secondary to large right pleural effusion. Enlarged mediastinal lymph nodes are demonstrated including a 1.3 cm prevascular lymph node (image 55; series 2). There is a 0.8 cm right paratracheal lymph node (image 52; series 2). Esophagus is poorly visualized. Lungs/Pleura: Mild leftward shift of the tracheal air column. There is a 6 mm nodule within the left lower lobe (image 121; series 3). There is a 6 mm left lower lobe nodule (image 79; series 3). There is a 5 mm nodule in the lingula (image 55; series 3). There is a 3 mm nodule within  the left upper lobe (image 41; series 3). There is a large right pleural effusion with  near total collapse of the right lung. There is minimal aeration of the right upper lobe. Multiple enhancing predominantly pleural-based nodules are demonstrated throughout the right hemithorax. There is a more dominant pleural-based nodule demonstrated within the anterior right upper hemithorax which measures up to 7 cm. Upper Abdomen: Unremarkable. Musculoskeletal: Thoracic spine degenerative changes. No aggressive or acute appearing osseous lesions. IMPRESSION: 1. Multiple enhancing pleural-based nodules are demonstrated throughout the right hemithorax with large right pleural effusion most concerning for pleural-based malignancy/metastatic disease. There is a more dominant pleural-based mass within the anterior right hemithorax. 2. Multiple enlarged mediastinal lymph nodes concerning for nodal metastatic disease. 3. Multiple nodules within the left lung concerning for the possibility of pulmonary metastatic disease. 4. These results were called by telephone at the time of interpretation on 02/21/2018 at 5:58 pm. Electronically Signed   By: Lovey Newcomer M.D.   On: 02/21/2018 18:00   Mr Jeri Cos IR Contrast  Result Date: 02/24/2018 CLINICAL DATA:  New diagnosis lung carcinoma. EXAM: MRI HEAD WITHOUT AND WITH CONTRAST TECHNIQUE: Multiplanar, multiecho pulse sequences of the brain and surrounding structures were obtained without and with intravenous contrast. CONTRAST:  66mL MULTIHANCE GADOBENATE DIMEGLUMINE 529 MG/ML IV SOLN COMPARISON:  None. FINDINGS: Brain: Diffusion imaging does not show any acute or subacute infarction. Brainstem is normal. 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 There is old infarction in the deep insula and right posterior frontal brain. Mild chronic small-vessel changes seen elsewhere within the hemispheric white matter. There is no evidence of metastatic disease. No hemorrhage, hydrocephalus  or extra-axial collection. Vascular: Major vessels at the base of the brain show flow. Skull and upper cervical spine: Negative Sinuses/Orbits: Clear/normal Other: None IMPRESSION: No evidence of metastatic disease.  Old ischemic changes as above. Electronically Signed   By: Nelson Chimes M.D.   On: 02/24/2018 11:58   Dg Chest Port 1 View  Result Date: 02/26/2018 CLINICAL DATA:  Chest tube in place.  Lung cancer. EXAM: PORTABLE CHEST 1 VIEW COMPARISON:  One-view chest x-ray 02/23/2018 FINDINGS: Heart size is normal. A right IJ Port-A-Cath is stable. Two right-sided chest tubes are stable in position. Interstitial and airspace disease is increasing on the right. A right pleural effusion is suspected. The left lung is clear. The visualized soft tissues and bony thorax are unremarkable. No residual pneumothorax is present. IMPRESSION: 1. Increasing right pleural effusion and airspace disease. 2. PleurX catheter and right-sided chest tube stable in position. 3. Right IJ catheter stable. Electronically Signed   By: San Morelle M.D.   On: 02/26/2018 07:17   Dg Chest Port 1 View  Result Date: 02/23/2018 CLINICAL DATA:  Postoperative chest x-ray.  Tube placements. EXAM: PORTABLE CHEST 1 VIEW COMPARISON:  02/22/2018. FINDINGS: PowerPort catheter with tip over cavoatrial junction. Two right chest tubes noted with tip over the right upper chest. Significant reduction in right-sided pleural effusion with mild residual. No pneumothorax. Atelectatic changes are noted in the right upper and lower lungs. Follow-up exams to demonstrate resolution suggested. Heart size normal. No acute bony abnormality. IMPRESSION: 1.  PowerPort catheter noted with tip at the cavoatrial junction. 2. Two right chest tubes noted. Significant reduction in right-sided pleural effusion with mild residual. Atelectatic changes noted the right upper and lower lungs. Follow-up exams demonstrate resolution suggested. Electronically Signed   By:  Marcello Moores  Register   On: 02/23/2018 16:10   Dg Chest Port 1 View  Result Date: 02/22/2018 CLINICAL DATA:  Post right-sided thoracentesis EXAM: PORTABLE CHEST 1 VIEW  COMPARISON:  02/21/2018; chest CT-02/21/2018 FINDINGS: Interval reduction in persistent moderate-sized right-sided effusion post thoracentesis. No pneumothorax. Slight reduction in previously noted right to left mediastinal shift. Otherwise, unchanged cardiac silhouette and mediastinal contours with persistent obscuration of the right heart border secondary to right basilar consolidative opacities. Post PFO ligation. No new focal airspace opacities. The left hemithorax remains well aerated. No acute osseus abnormalities. IMPRESSION: Interval reduction in persistent moderate-sized right-sided effusion post thoracentesis. No pneumothorax. Electronically Signed   By: Sandi Mariscal M.D.   On: 02/22/2018 10:41   Dg C-arm 1-60 Min-no Report  Result Date: 02/23/2018 Fluoroscopy was utilized by the requesting physician.  No radiographic interpretation.   US Thoracentesis Asp Pleural Space W/img Guide  Result Date: 02/22/2018 INDICATION: No known primary, now with multiple pleural based masses and symptomatic right-sided pleural effusion. Please perform ultrasound-guided thoracentesis for diagnostic and therapeutic purposes. EXAM: US THORACENTESIS ASP PLEURAL SPACE W/IMG GUIDE COMPARISON:  Chest radiograph - 02/21/2018; chest CT - 02/21/2018 MEDICATIONS: None. COMPLICATIONS: None immediate. TECHNIQUE: Informed written consent was obtained from the patient after a discussion of the risks, benefits and alternatives to treatment. A timeout was performed prior to the initiation of the procedure. Initial ultrasound scanning demonstrates a large anechoic right-sided pleural effusion. The lower chest was prepped and draped in the usual sterile fashion. 1% lidocaine was used for local anesthesia. An ultrasound image was saved for documentation purposes. An 8 Fr  Safe-T-Centesis catheter was introduced. The thoracentesis was performed. The catheter was removed and a dressing was applied. The patient tolerated the procedure well without immediate post procedural complication. The patient was escorted to have an upright chest radiograph. FINDINGS: A total of approximately 2 liters of serous, slightly blood tinged fluid was removed. Requested samples were sent to the laboratory. IMPRESSION: Successful ultrasound-guided right sided thoracentesis yielding 2 liters of pleural fluid. Electronically Signed   By: Sandi Mariscal M.D.   On: 02/22/2018 11:06    ECG & Cardiac Imaging    Sinus tachycardia, 103, freq pvc's, inflat st dep w/ inf twi. Septal infarct.  Assessment & Plan    1.  PVCs with ventricular bigeminy and trigeminy: Patient was admitted with progressive dyspnea and pleuritic chest pain, found to have a right pleural effusion with pleural mass, and concern for metastatic lung cancer.  On admission, ECG notable for frequent PVCs.  He has noted increased frequency and palpitations as well.  He was moved to telemetry this morning secondary to discrepancies and heart rate monitoring/rates reported by auscultation versus pulse oximetry.  He is having ventricular bigeminy and trigeminy.  Potassium is within normal limits.  I sent off of magnesium and TSH earlier, which were both within normal limits.  I also ordered an echocardiogram which is currently pending.  I will add low-dose beta-blocker therapy to try and suppress PVCs and we can look to titrate this as necessary.  If we are not able to adequately suppress PVCs, we may need to add oral antiarrhythmic therapy as I suspect his burden at this point is greater than 20,000/day.  2.  Right pleural effusion/right lung mass with concern for metastasis: Status post pleural biopsy with talc pleurodesis and chest tube placement.  Chest tube pulled this morning.  Markedly diminished breath sounds on the right.  Being seen  by thoracic surgery as well as oncology.  3.  Essential HTN:  Stable.  He is on lisinopril-hctz @ home.  Adding  blocker here.  Signed, Murray Hodgkins, NP 02/26/2018, 2:57 PM  For questions or updates, please contact   Please consult [email or web address] for contact info under Cardiology/STEMI.

## 2018-02-26 NOTE — Progress Notes (Signed)
Pt scheduled for PET on 6/6 at 8:30am and will follow up with Dr. Tasia Catchings on Friday 6/7 at 2:15pm. Will send Omniseq once final path reported.

## 2018-02-26 NOTE — Progress Notes (Signed)
Patient ID: Ruben Eaton, male   DOB: Feb 17, 1971, 47 y.o.   MRN: [id number]  He states that his overall breathing is much improved.  He is not complaining of any significant shortness of breath.  His pain is well-managed.  His chest tube drained 180 cc over the last 24 hours.  The family has watched the video on management of his Pleurx catheter.  All of his wounds were inspected and are clean dry and intact.  I did remove his large bore chest tube.  His chest x-ray prior to this showed the expected postoperative changes.  I explained to them that he should drain the catheter daily beginning tomorrow.  They will keep a running record of that.  He will come back to see me again in 1 week.  From my standpoint he may be discharged home.

## 2018-02-26 NOTE — Progress Notes (Signed)
[facility] [place] NAME: Ruben Eaton    MR#:  [id number]  DATE OF BIRTH:  1971-01-19  SUBJECTIVE:  CHIEF COMPLAINT:   Chief Complaint  Patient presents with  . Shortness of Breath    R sided Pneumothorax   Patient had his chest tube removed he is transferred to telemetry floor due to A. fib cardiology Heart rate noted to be low during the night REVIEW OF SYSTEMS:  CONSTITUTIONAL: No fever, fatigue or weakness.  EYES: No blurred or double vision.  EARS, NOSE, AND THROAT: No tinnitus or ear pain.  RESPIRATORY: No cough, shortness of breath, wheezing or hemoptysis.  CARDIOVASCULAR: No chest pain, orthopnea, edema.  GASTROINTESTINAL: No nausea, vomiting, diarrhea or abdominal pain.  GENITOURINARY: No dysuria, hematuria.  ENDOCRINE: No polyuria, nocturia,  HEMATOLOGY: No anemia, easy bruising or bleeding SKIN: No rash or lesion. MUSCULOSKELETAL: No joint pain or arthritis.   NEUROLOGIC: No tingling, numbness, weakness.  PSYCHIATRY: No anxiety or depression.   ROS  DRUG ALLERGIES:  No Known Allergies  VITALS:  Blood pressure (!) 123/57, pulse 94, temperature 98.4 F (36.9 C), temperature source Oral, resp. rate 20, height 5\' 6"  (1.676 m), weight 79.7 kg (175 lb 12.8 oz), SpO2 98 %.  PHYSICAL EXAMINATION:  GENERAL:  47 y.o.-year-old patient lying in the bed with no acute distress.  EYES: Pupils equal, round, reactive to light and accommodation. No scleral icterus. Extraocular muscles intact.  HEENT: Head atraumatic, normocephalic. Oropharynx and nasopharynx clear.  NECK:  Supple, no jugular venous distention. No thyroid enlargement, no tenderness.  LUNGS: Normal breath sounds bilaterally, no wheezing, rales,rhonchi or crepitation. No use of accessory muscles of respiration.  Chest tube in place  cARDIOVASCULAR: S1, S2 normal. No murmurs, rubs, or gallops.  ABDOMEN: Soft, nontender, nondistended. Bowel sounds present. No  organomegaly or mass.  EXTREMITIES: No pedal edema, cyanosis, or clubbing.  NEUROLOGIC: Cranial nerves II through XII are intact. Muscle strength 5/5 in all extremities. Sensation intact. Gait not checked.  PSYCHIATRIC: The patient is alert and oriented x 3.  SKIN: No obvious rash, lesion, or ulcer.   Physical Exam LABORATORY PANEL:   CBC Recent Labs  Lab 02/23/18 0237  WBC 8.4  HGB 13.7  HCT 38.4*  PLT 291   ------------------------------------------------------------------------------------------------------------------  Chemistries  Recent Labs  Lab 02/23/18 0237  02/26/18 0615  NA 131*   < > 133*  K 4.0   < > 4.0  CL 99*   < > 101  CO2 24   < > 26  GLUCOSE 107*   < > 96  BUN 17   < > 17  CREATININE 0.79   < > 0.96  CALCIUM 8.4*   < > 8.0*  MG  --   --  1.9  AST 24  --   --   ALT 20  --   --   ALKPHOS 54  --   --   BILITOT 0.4  --   --    < > = values in this interval not displayed.   ------------------------------------------------------------------------------------------------------------------  Cardiac Enzymes Recent Labs  Lab 02/21/18 1602  TROPONINI <0.03   ------------------------------------------------------------------------------------------------------------------  RADIOLOGY:  Dg Chest Port 1 View  Result Date: 02/26/2018 CLINICAL DATA:  Chest tube in place.  Lung cancer. EXAM: PORTABLE CHEST 1 VIEW COMPARISON:  One-view chest x-ray 02/23/2018 FINDINGS: Heart size is normal. A right IJ Port-A-Cath is stable. Two right-sided chest tubes are stable in position. Interstitial and airspace  disease is increasing on the right. A right pleural effusion is suspected. The left lung is clear. The visualized soft tissues and bony thorax are unremarkable. No residual pneumothorax is present. IMPRESSION: 1. Increasing right pleural effusion and airspace disease. 2. PleurX catheter and right-sided chest tube stable in position. 3. Right IJ catheter stable.  Electronically Signed   By: San Morelle M.D.   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 Management plans discussed with the patient, family and they are in agreement.  CODE STATUS: full  TOTAL TIME TAKING CARE OF THIS PATIENT: 35 minutes.     POSSIBLE D/C IN 3-4 DAYS, DEPENDING ON CLINICAL CONDITION.   Dustin Flock M.D on 02/26/2018   Between 7am to 6pm - Pager - [phone]  After 6pm go to [email or web address] - password [email or web address] [facility]  Office  [phone]  CC: Primary care physician; Dion Body, MD  Note: This dictation was prepared with Dragon dictation along with smaller phrase technology. Any transcriptional errors that result from this process are unintentional.

## 2018-02-26 NOTE — Progress Notes (Signed)
*  PRELIMINARY RESULTS* Echocardiogram 2D Echocardiogram has been performed.  Ruben Eaton 02/26/2018, 12:19 PM

## 2018-02-26 NOTE — Progress Notes (Signed)
Ruben Eaton, please present the patient at the tumor conference on June 6th in my absence.  Patient has appointment with Dr. Tasia Catchings on the June 7th.   Thank you all!

## 2018-02-26 NOTE — Progress Notes (Signed)
Pt placed on telemetry and called to central monitoring.  Charge nurse aware of bed transfer.  Discussed with pt and wife .  Aware of pending transfer to cardiac unit for closer monitoring.  Am labs NA 133, K 4.0, Calcium 8.0.  Oncoming nurse to follow transfer. Dorna Bloom RN

## 2018-02-26 NOTE — Progress Notes (Signed)
.  gbin MERLIN GOLDEN   DOB:1971/02/24   GB#:[id number]    Subjective: Patient currently doing well.  Appetite improving.  Is breathing better.  Patient had his catheter taken out.  Patient noted to have frequent PVCs overnight; and is currently transferred to the cardiology floor for observation.  Patient eager to go home.  objective:  Vitals:   02/26/18 1652 02/26/18 2032  BP: (!) 115/57 119/64  Pulse: 93 92  Resp:  18  Temp: 98.4 F (36.9 C) 98.3 F (36.8 C)  SpO2: 97% 98%     Intake/Output Summary (Last 24 hours) at 02/26/2018 2146 Last data filed at 02/26/2018 1900 Gross per 24 hour  Intake 240 ml  Output 50 ml  Net 190 ml    GENERAL Alert, no distress and comfortable.  Is alone. EYES: no pallor or icterus OROPHARYNX: no thrush or ulceration. NECK: supple, no masses felt LYMPH:  no palpable lymphadenopathy in the cervical, axillary or inguinal regions LUNGS: decreased breath sounds to auscultation right compared to left and  No wheeze or crackles.  HEART/CVS: regular rate & rhythm and no murmurs; No lower extremity edema ABDOMEN: abdomen soft, tender  on deep palpation. and normal bowel sounds Musculoskeletal:no cyanosis of digits and no clubbing  PSYCH: alert & oriented x 3 with fluent speech NEURO: no focal motor/sensory deficits SKIN:  no rashes or significant lesions   Labs:  Lab Results  Component Value Date   WBC 8.4 02/23/2018   HGB 13.7 02/23/2018   HCT 38.4 (L) 02/23/2018   MCV 93.8 02/23/2018   PLT 291 02/23/2018   NEUTROABS 1.9 10/05/2016    Lab Results  Component Value Date   NA 133 (L) 02/26/2018   K 4.0 02/26/2018   CL 101 02/26/2018   CO2 26 02/26/2018    Studies:  Dg Chest Port 1 View  Result Date: 02/26/2018 CLINICAL DATA:  Chest tube in place.  Lung cancer. EXAM: PORTABLE CHEST 1 VIEW COMPARISON:  One-view chest x-ray 02/23/2018 FINDINGS: Heart size is normal. A right IJ Port-A-Cath is stable. Two right-sided chest tubes are stable in  position. Interstitial and airspace disease is increasing on the right. A right pleural effusion is suspected. The left lung is clear. The visualized soft tissues and bony thorax are unremarkable. No residual pneumothorax is present. IMPRESSION: 1. Increasing right pleural effusion and airspace disease. 2. PleurX catheter and right-sided chest tube stable in position. 3. Right IJ catheter stable. Electronically Signed   By: San Morelle M.D.   On: 02/26/2018 07:17    Assessment & Plan:   #47 year old male patient in the hospital with malignant pleural effusions right side  # Right-sided malignant pleural effusion/right lung mass cytology positive for malignancy; discussed with Dr. Jena Gauss differentiated malignancy.  IHC stains pending; molecular testing/NGS ordered.  Patient awaiting PET scan on 06/06.   #Given the uncertain pathologic diagnosis as of now/and the lack of urgency of to proceed with treatment at this time-I would await the results of the NGS/final pathology report to come up with a treatment plan.  Patient agrees.  #Frequent PVCs-patient evaluated by cardiology; started on metoprolol.   #We also reviewed the pathology imaging at the tumor conference on June 6th. Pt to follow up with Dr.Yu on 06/07.  I will add the patient the tumor conference for June 6th.   Cammie Sickle, MD 02/26/2018  9:46 PM

## 2018-02-26 NOTE — Progress Notes (Signed)
Pt transferred to 2A, Report given to RN. Chest tube pulled prior to transfer. VS stable. A&Ox4.  Kyla Balzarine, LPN

## 2018-02-27 ENCOUNTER — Other Ambulatory Visit: Payer: Self-pay | Admitting: Nurse Practitioner

## 2018-02-27 DIAGNOSIS — I493 Ventricular premature depolarization: Secondary | ICD-10-CM

## 2018-02-27 MED ORDER — SENNOSIDES-DOCUSATE SODIUM 8.6-50 MG PO TABS: 1.0000 | ORAL_TABLET | Freq: Every day | ORAL | 0 refills | Status: DC

## 2018-02-27 MED ORDER — OXYCODONE-ACETAMINOPHEN 5-325 MG PO TABS: 1.0000 | ORAL_TABLET | Freq: Four times a day (QID) | ORAL | 0 refills | Status: DC | PRN

## 2018-02-27 MED ORDER — HEPARIN SOD (PORK) LOCK FLUSH 100 UNIT/ML IV SOLN
500.0000 [IU] | Freq: Once | INTRAVENOUS | Status: AC
  Administered 2018-02-27: 500 [IU] via INTRAVENOUS
  Filled 2018-02-27: qty 5

## 2018-02-27 MED ORDER — HEPARIN SOD (PORK) LOCK FLUSH 100 UNIT/ML IV SOLN
500.0000 [IU] | Freq: Once | INTRAVENOUS | Status: DC
  Filled 2018-02-27: qty 5

## 2018-02-27 MED ORDER — METOPROLOL TARTRATE 25 MG PO TABS
25.0000 mg | ORAL_TABLET | Freq: Two times a day (BID) | ORAL | Status: DC
  Administered 2018-02-27: 25 mg via ORAL

## 2018-02-27 MED ORDER — METOPROLOL TARTRATE 25 MG PO TABS: 25.0000 mg | ORAL_TABLET | Freq: Two times a day (BID) | ORAL | 2 refills | Status: AC

## 2018-02-27 NOTE — Discharge Summary (Signed)
[employer] - [place] at [facility], 47 y.o., DOB 15-Apr-1971, MRN [id number]. 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  On the right.  He underwent thoracentesis which showed malignant cells.  Therefore cardiothoracic surgery was consulted.  Patient underwent talc pleurodesis, Pleurx catheter placement and lung biopsy were done.  Final pathology results from the lung biopsy are pending.  Patient also had a paroxysmal atrial fibrillation was seen by cardiology.  He also developed PVCs.  Patient's echocardiogram was normal.  He is recommended to be treated with oral metoprolol and outpatient cardiology follow-up.  In terms of her his oncology work-up he has oncology follow-up and a PET scan scheduled.  MRI of the brain was negative.  He also had hyponatremia which was treated with fluid restriction.             Consults  Oncology, pulmonary, cardiothoracic surgery  Significant Tests:  See full reports for all details    Dg Chest 2 View  Result Date: 02/21/2018 CLINICAL DATA:  Shortness of breath and chest pain beginning 3 days ago. EXAM: CHEST - 2 VIEW COMPARISON:  07/21/2006 FINDINGS: Left hemithorax is clear. ASD closure device overlies the heart. There is a large right effusion with near complete collapse of the right  lung. Partial aeration of the right upper lobe. Mediastinum bows towards the left. No significant bone finding. IMPRESSION: Large right effusion with near complete collapse of the right lung. Partial aeration of the right upper lobe. Mediastinal shift towards the left. Left chest otherwise clear. ASD closure device. Electronically Signed   By: Nelson Chimes M.D.   On: 02/21/2018 16:20   Ct Chest W Contrast  Result Date: 02/21/2018 CLINICAL DATA:  Patient with shortness of breath. History of bronchitis. Large right pleural effusion. EXAM: CT CHEST WITH CONTRAST TECHNIQUE: Multidetector CT imaging of the chest was performed during intravenous contrast administration. CONTRAST:  64mL ISOVUE-370 IOPAMIDOL (ISOVUE-370) INJECTION 76% COMPARISON:  Chest radiograph 02/21/2018 FINDINGS: Cardiovascular: Normal heart size. Trace fluid superior pericardial recess. Normal caliber aorta and main pulmonary artery. Mediastinum/Nodes: Leftward mediastinal shift secondary to large right pleural effusion. Enlarged mediastinal lymph nodes are demonstrated including a 1.3 cm prevascular lymph node (image 55; series 2). There is a 0.8 cm right paratracheal lymph node (image 52; series 2). Esophagus is poorly visualized. Lungs/Pleura: Mild leftward shift of the tracheal air column. There is a 6 mm nodule within the left lower lobe (image 121; series 3). There is a 6 mm left lower lobe nodule (image 79; series 3). There is a 5 mm nodule in the lingula (image 55; series 3). There is a 3 mm nodule within the left upper lobe (image 41; series 3). There is a large right pleural effusion with near total collapse of the right lung. There is minimal aeration of the right upper lobe. Multiple enhancing predominantly pleural-based nodules are demonstrated throughout the right hemithorax. There is a more dominant pleural-based nodule demonstrated within  the anterior right upper hemithorax which measures up to 7 cm. Upper Abdomen: Unremarkable.  Musculoskeletal: Thoracic spine degenerative changes. No aggressive or acute appearing osseous lesions. IMPRESSION: 1. Multiple enhancing pleural-based nodules are demonstrated throughout the right hemithorax with large right pleural effusion most concerning for pleural-based malignancy/metastatic disease. There is a more dominant pleural-based mass within the anterior right hemithorax. 2. Multiple enlarged mediastinal lymph nodes concerning for nodal metastatic disease. 3. Multiple nodules within the left lung concerning for the possibility of pulmonary metastatic disease. 4. These results were called by telephone at the time of interpretation on 02/21/2018 at 5:58 pm. Electronically Signed   By: Lovey Newcomer M.D.   On: 02/21/2018 18:00   Mr Jeri Cos HY Contrast  Result Date: 02/24/2018 CLINICAL DATA:  New diagnosis lung carcinoma. EXAM: MRI HEAD WITHOUT AND WITH CONTRAST TECHNIQUE: Multiplanar, multiecho pulse sequences of the brain and surrounding structures were obtained without and with intravenous contrast. CONTRAST:  57mL MULTIHANCE GADOBENATE DIMEGLUMINE 529 MG/ML IV SOLN COMPARISON:  None. FINDINGS: Brain: Diffusion imaging does not show any acute or subacute infarction. Brainstem is normal. 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 There is old infarction in the deep insula and right posterior frontal brain. Mild chronic small-vessel changes seen elsewhere within the hemispheric white matter. There is no evidence of metastatic disease. No hemorrhage, hydrocephalus or extra-axial collection. Vascular: Major vessels at the base of the brain show flow. Skull and upper cervical spine: Negative Sinuses/Orbits: Clear/normal Other: None IMPRESSION: No evidence of metastatic disease.  Old ischemic changes as above. Electronically Signed   By: Nelson Chimes M.D.   On: 02/24/2018 11:58   Dg Chest Port 1 View  Result Date: 02/26/2018 CLINICAL DATA:  Chest tube in place.  Lung cancer. EXAM: PORTABLE CHEST 1  VIEW COMPARISON:  One-view chest x-ray 02/23/2018 FINDINGS: Heart size is normal. A right IJ Port-A-Cath is stable. Two right-sided chest tubes are stable in position. Interstitial and airspace disease is increasing on the right. A right pleural effusion is suspected. The left lung is clear. The visualized soft tissues and bony thorax are unremarkable. No residual pneumothorax is present. IMPRESSION: 1. Increasing right pleural effusion and airspace disease. 2. PleurX catheter and right-sided chest tube stable in position. 3. Right IJ catheter stable. Electronically Signed   By: San Morelle M.D.   On: 02/26/2018 07:17   Dg Chest Port 1 View  Result Date: 02/23/2018 CLINICAL DATA:  Postoperative chest x-ray.  Tube placements. EXAM: PORTABLE CHEST 1 VIEW COMPARISON:  02/22/2018. FINDINGS: PowerPort catheter with tip over cavoatrial junction. Two right chest tubes noted with tip over the right upper chest. Significant reduction in right-sided pleural effusion with mild residual. No pneumothorax. Atelectatic changes are noted in the right upper and lower lungs. Follow-up exams to demonstrate resolution suggested. Heart size normal. No acute bony abnormality. IMPRESSION: 1.  PowerPort catheter noted with tip at the cavoatrial junction. 2. Two right chest tubes noted. Significant reduction in right-sided pleural effusion with mild residual. Atelectatic changes noted the right upper and lower lungs. Follow-up exams demonstrate resolution suggested. Electronically Signed   By: Marcello Moores  Register   On: 02/23/2018 16:10   Dg Chest Port 1 View  Result Date: 02/22/2018 CLINICAL DATA:  Post right-sided thoracentesis EXAM: PORTABLE CHEST 1 VIEW COMPARISON:  02/21/2018; chest CT-02/21/2018 FINDINGS: Interval reduction in persistent moderate-sized right-sided effusion post thoracentesis. No pneumothorax. Slight reduction in previously noted right to left mediastinal shift. Otherwise, unchanged cardiac silhouette and  mediastinal contours with  persistent obscuration of the right heart border secondary to right basilar consolidative opacities. Post PFO ligation. No new focal airspace opacities. The left hemithorax remains well aerated. No acute osseus abnormalities. IMPRESSION: Interval reduction in persistent moderate-sized right-sided effusion post thoracentesis. No pneumothorax. Electronically Signed   By: Sandi Mariscal M.D.   On: 02/22/2018 10:41   Dg C-arm 1-60 Min-no Report  Result Date: 02/23/2018 Fluoroscopy was utilized by the requesting physician.  No radiographic interpretation.   US Thoracentesis Asp Pleural Space W/img Guide  Result Date: 02/22/2018 INDICATION: No known primary, now with multiple pleural based masses and symptomatic right-sided pleural effusion. Please perform ultrasound-guided thoracentesis for diagnostic and therapeutic purposes. EXAM: US THORACENTESIS ASP PLEURAL SPACE W/IMG GUIDE COMPARISON:  Chest radiograph - 02/21/2018; chest CT - 02/21/2018 MEDICATIONS: None. COMPLICATIONS: None immediate. TECHNIQUE: Informed written consent was obtained from the patient after a discussion of the risks, benefits and alternatives to treatment. A timeout was performed prior to the initiation of the procedure. Initial ultrasound scanning demonstrates a large anechoic right-sided pleural effusion. The lower chest was prepped and draped in the usual sterile fashion. 1% lidocaine was used for local anesthesia. An ultrasound image was saved for documentation purposes. An 8 Fr Safe-T-Centesis catheter was introduced. The thoracentesis was performed. The catheter was removed and a dressing was applied. The patient tolerated the procedure well without immediate post procedural complication. The patient was escorted to have an upright chest radiograph. FINDINGS: A total of approximately 2 liters of serous, slightly blood tinged fluid was removed. Requested samples were sent to the laboratory. IMPRESSION: Successful  ultrasound-guided right sided thoracentesis yielding 2 liters of pleural fluid. Electronically Signed   By: Sandi Mariscal M.D.   On: 02/22/2018 11:06       Today   Subjective:   Ruben Eaton patient doing much better wants to go home Objective:   Blood pressure 113/60, pulse 86, temperature 98.2 F (36.8 C), temperature source Oral, resp. rate 18, height 5\' 6"  (1.676 m), weight 79.7 kg (175 lb 12.8 oz), SpO2 94 %.  .  Intake/Output Summary (Last 24 hours) at 02/27/2018 1542 Last data filed at 02/27/2018 0948 Gross per 24 hour  Intake 480 ml  Output 0 ml  Net 480 ml    Exam VITAL SIGNS: Blood pressure 113/60, pulse 86, temperature 98.2 F (36.8 C), temperature source Oral, resp. rate 18, height 5\' 6"  (1.676 m), weight 79.7 kg (175 lb 12.8 oz), SpO2 94 %.  GENERAL:  47 y.o.-year-old patient lying in the bed with no acute distress.  EYES: Pupils equal, round, reactive to light and accommodation. No scleral icterus. Extraocular muscles intact.  HEENT: Head atraumatic, normocephalic. Oropharynx and nasopharynx clear.  NECK:  Supple, no jugular venous distention. No thyroid enlargement, no tenderness.  LUNGS: Normal breath sounds bilaterally, no wheezing, rales,rhonchi or crepitation. No use of accessory muscles of respiration.  CARDIOVASCULAR: S1, S2 normal. No murmurs, rubs, or gallops.  ABDOMEN: Soft, nontender, nondistended. Bowel sounds present. No organomegaly or mass.  EXTREMITIES: No pedal edema, cyanosis, or clubbing.  NEUROLOGIC: Cranial nerves II through XII are intact. Muscle strength 5/5 in all extremities. Sensation intact. Gait not checked.  PSYCHIATRIC: The patient is alert and oriented x 3.  SKIN: No obvious rash, lesion, or ulcer.   Data Review     CBC w Diff:  Lab Results  Component Value Date   WBC 8.4 02/23/2018   HGB 13.7 02/23/2018   HGB 13.2 10/05/2016   HCT 38.4 (L) 02/23/2018  HCT 39.1 10/05/2016   PLT 291 02/23/2018   PLT 250 10/05/2016   CMP:   Lab Results  Component Value Date   NA 133 (L) 02/26/2018   NA 141 10/05/2016   K 4.0 02/26/2018   K 4.3 05/15/2013   CL 101 02/26/2018   CO2 26 02/26/2018   BUN 17 02/26/2018   BUN 17 10/05/2016   CREATININE 0.96 02/26/2018   PROT 5.8 (L) 02/23/2018   ALBUMIN 3.0 (L) 02/23/2018   BILITOT 0.4 02/23/2018   ALKPHOS 54 02/23/2018   AST 24 02/23/2018   ALT 20 02/23/2018  .  Micro Results Recent Results (from the past 240 hour(s))  Body fluid culture     Status: None   Collection Time: 02/22/18  9:45 AM  Result Value Ref Range Status   Specimen Description   Final    PLEURAL Performed at [facility], [address], [place], [place] [address]    Special Requests   Final    PLEURAL Performed at [facility], [address] [place], [place] [address]    Gram Stain   Final    FEW WBC PRESENT, PREDOMINANTLY MONONUCLEAR NO ORGANISMS SEEN    Culture   Final    NO GROWTH 3 DAYS Performed at [facility], [place] [address], [place], [place] [address]    Report Status 02/25/2018 FINAL  Final  Acid Fast Smear (AFB)     Status: None   Collection Time: 02/22/18 10:42 AM  Result Value Ref Range Status   AFB Specimen Processing Concentration  Final   Acid Fast Smear Negative  Final    Comment: (NOTE) Performed At: [facility] [place], [place] [id number] Rush Farmer MD OF:[id number]    Source (AFB) PLEURAL  Final    Comment: Performed at [facility], [address] [place], [place] [address]        Code Status Orders  (From admission, onward)        Start     Ordered   02/21/18 1849  Full code  Continuous     02/21/18 1849    Code Status History    Date Active Date Inactive Code Status Order ID Comments User Context   10/07/2016 1244 10/08/2016 1701 Full Code [id number]  [name], MD Inpatient   09/07/2016 0533 09/08/2016 1532 Full Code [id number]  Saundra Shelling, MD Inpatient           Follow-up Information    [name], MD. Go on 03/14/2018.   Specialty:  Cardiology Why:  Appointment Time: 2:00pm with Estella Husk, PA Contact information: [address] [place] [address] [phone]        Earlie Server, MD. Daphane Shepherd on 03/02/2018.   Specialty:  Oncology Why:  Appointment Time: 2:15pm Contact information: [place] [address] [phone]        pet scan. Go on 03/01/2018.   Why:  Appointment Time: 8:30am          Discharge Medications   Allergies as of 02/27/2018   No Known Allergies     Medication List    STOP taking these medications   lisinopril-hydrochlorothiazide 20-12.5 MG tablet Commonly known as:  PRINZIDE,ZESTORETIC     TAKE these medications   aspirin 81 MG EC tablet Take 1 tablet (81 mg total) by mouth daily.   atorvastatin 40 MG tablet Commonly known as:  LIPITOR Take 1 tablet (40 mg total) by  mouth daily at 6 PM.   b complex vitamins capsule Take 1 capsule by mouth daily.   Diclofenac Sodium 2 % Soln Apply 1 pump twice daily as needed. What changed:    how much to take  how to take this  when to take this  reasons to take this  additional instructions   loratadine 10 MG tablet Commonly known as:  CLARITIN Take 10 mg by mouth daily.   metoprolol tartrate 25 MG tablet Commonly known as:  LOPRESSOR Take 1 tablet (25 mg total) by mouth 2 (two) times daily.   multivitamin with minerals Tabs tablet Take 1 tablet by mouth daily.   oxyCODONE-acetaminophen 5-325 MG tablet Commonly known as:  PERCOCET/ROXICET Take 1-2 tablets by mouth every 6 (six) hours as needed for moderate pain.   senna-docusate 8.6-50 MG tablet Commonly known as:  Senokot-S Take 1 tablet by mouth daily.   tadalafil 5 MG tablet Commonly known as:  CIALIS Take 5 mg by mouth daily as needed for erectile dysfunction.   triamcinolone 55 MCG/ACT Aero nasal inhaler Commonly known as:   NASACORT Place 2 sprays into the nose daily as needed (congestion).   Vitamin D (Ergocalciferol) 50000 units Caps capsule Commonly known as:  DRISDOL Take 1 capsule (50,000 Units total) by mouth every 7 (seven) days. What changed:  additional instructions   zolpidem 10 MG tablet Commonly known as:  AMBIEN Take 10 mg by mouth at bedtime as needed for sleep.          Total Time in preparing paper work, data evaluation and todays exam - 56 minutes  Dustin Flock M.D on 02/27/2018 at 3:42 PM [place]  [phone]

## 2018-02-27 NOTE — Progress Notes (Signed)
Noted. Will do. Thx!

## 2018-02-27 NOTE — Progress Notes (Signed)
Progress Note  Patient Name: Ruben Eaton Date of Encounter: 02/27/2018  Primary Cardiologist: [name], MD  Subjective   No chest pain or sob.  Breathing better.  No signif palps.  Inpatient Medications    Scheduled Meds: . heparin lock flush  500 Units Intravenous Once  . metoprolol tartrate  25 mg Oral BID  . sodium chloride flush  3 mL Intravenous Q12H  . traMADol  50-100 mg Oral Q6H   Continuous Infusions: . anticoagulant sodium citrate     PRN Meds: albuterol, alteplase, anticoagulant sodium citrate, docusate sodium, heparin lock flush, LORazepam, morphine injection, ondansetron (ZOFRAN) IV, oxyCODONE-acetaminophen, sodium chloride flush, sodium chloride flush, sodium chloride flush, sodium chloride flush, sodium chloride flush   Vital Signs    Vitals:   02/26/18 1652 02/26/18 2032 02/27/18 0430 02/27/18 0754  BP: (!) 115/57 119/64 (!) 131/58 113/60  Pulse: 93 92 84 86  Resp:  18 18 18   Temp: 98.4 F (36.9 C) 98.3 F (36.8 C) 99.1 F (37.3 C) 98.2 F (36.8 C)  TempSrc: Oral Oral Oral Oral  SpO2: 97% 98% 95% 94%  Weight:      Height:        Intake/Output Summary (Last 24 hours) at 02/27/2018 1358 Last data filed at 02/27/2018 0948 Gross per 24 hour  Intake 480 ml  Output 0 ml  Net 480 ml   Filed Weights   02/21/18 1600 02/21/18 2003 02/23/18 0446  Weight: 165 lb (74.8 kg) 175 lb 0.7 oz (79.4 kg) 175 lb 12.8 oz (79.7 kg)    Physical Exam   GEN: Well nourished, well developed, in no acute distress.  HEENT: Grossly normal.  Neck: Supple, no JVD, carotid bruits, or masses. Cardiac: irreg, freq ectopy, no murmurs, rubs, or gallops. No clubbing, cyanosis, edema.  Radials/DP/PT 2+ and equal bilaterally.  Respiratory:  Respirations regular and unlabored, diminished on right though better than yesterday.  CTA on left. GI: Soft, nontender, nondistended, BS + x 4. MS: no deformity or atrophy. Skin: warm and dry, no rash. Neuro:  Strength and  sensation are intact. Psych: AAOx3.  Normal affect.  Labs    Chemistry Recent Labs  Lab 02/23/18 0237 02/25/18 0743 02/26/18 0615  NA 131* 129* 133*  K 4.0 4.0 4.0  CL 99* 98* 101  CO2 24 24 26   GLUCOSE 107* 100* 96  BUN 17 11 17   CREATININE 0.79 0.76 0.96  CALCIUM 8.4* 7.8* 8.0*  PROT 5.8*  --   --   ALBUMIN 3.0*  --   --   AST 24  --   --   ALT 20  --   --   ALKPHOS 54  --   --   BILITOT 0.4  --   --   GFRNONAA >60 >60 >60  GFRAA >60 >60 >60  ANIONGAP 8 7 6      Hematology Recent Labs  Lab 02/21/18 1602 02/22/18 0543 02/23/18 0237  WBC 9.9 8.9 8.4  RBC 4.44 4.12* 4.09*  HGB 14.5 13.7 13.7  HCT 41.8 38.9* 38.4*  MCV 94.2 94.3 93.8  MCH 32.6 33.3 33.5  MCHC 34.6 35.3 35.7  RDW 13.1 13.4 12.9  PLT 307 290 291    Cardiac Enzymes Recent Labs  Lab 02/21/18 1602  TROPONINI <0.03      Radiology    Dg Chest Port 1 View  Result Date: 02/26/2018 CLINICAL DATA:  Chest tube in place.  Lung cancer. EXAM: PORTABLE CHEST 1 VIEW COMPARISON:  One-view chest x-ray 02/23/2018 FINDINGS: Heart size is normal. A right IJ Port-A-Cath is stable. Two right-sided chest tubes are stable in position. Interstitial and airspace disease is increasing on the right. A right pleural effusion is suspected. The left lung is clear. The visualized soft tissues and bony thorax are unremarkable. No residual pneumothorax is present. IMPRESSION: 1. Increasing right pleural effusion and airspace disease. 2. PleurX catheter and right-sided chest tube stable in position. 3. Right IJ catheter stable. Electronically Signed   By: San Morelle M.D.   On: 02/26/2018 07:17    Telemetry    Sinus w/ freq pvc's bi/trigeminy - Personally Reviewed  Cardiac Studies   Study Conclusions 6.3.2019  - Left ventricle: The cavity size was normal. Wall thickness was   normal. Systolic function was normal. The estimated ejection   fraction was in the range of 55% to 60%. Wall motion was normal;   there  were no regional wall motion abnormalities. The study is   not technically sufficient to allow evaluation of LV diastolic   function. - Atrial septum: A closure device was normal in appearance and   without apparent shunt; there was no structural compromise.  Patient Profile     47 y.o. male with a history of cryptogenic stroke (08/2016) in setting of PFO (s/p closure 09/2016), HTN, OSA, ED, and remote tob abuse, who was admitted with progressive dyspnea and finding of large right pleural effusion in the setting of a large right pleural mass and mediastinal lymph nodes, is being seen today for the evaluation of PVC's at the request of Dr. Posey Pronto.  Assessment & Plan    1.  PVC's w/ ventricular bigeminy and trigeminy: Echo w/ nl EF.  Persistent bi/trigeminy despite low dose  blocker.  Will titrate to 25mg  bid today.  Will set up for 24 hr holter to count pvc burden.  May need EP f/u and antiarrhythmic therapy if >20K pvc's.  2.  R pleural effusion/r lung mass: per heme/onc.  Pleurx cath in place.  Improved air mvmt.  3.  Essential HTN: stable on  blocker.  Signed, Murray Hodgkins, NP  02/27/2018, 1:58 PM    For questions or updates, please contact   Please consult [email or web address] for contact info under Cardiology/STEMI.

## 2018-02-27 NOTE — Progress Notes (Addendum)
Ruben Eaton to be D/C'd Home per MD order.  Discussed prescriptions and follow up appointments with the patient. Prescriptions were e-prescribed, medication list explained in detail. Pt verbalized understanding. Demonstrated how to drain his catheter with the Pleurx system and provided a hand out on how to do it step by step. Pt and wife verbalized understanding.  Allergies as of 02/27/2018   No Known Allergies     Medication List    STOP taking these medications   lisinopril-hydrochlorothiazide 20-12.5 MG tablet Commonly known as:  PRINZIDE,ZESTORETIC     TAKE these medications   aspirin 81 MG EC tablet Take 1 tablet (81 mg total) by mouth daily.   atorvastatin 40 MG tablet Commonly known as:  LIPITOR Take 1 tablet (40 mg total) by mouth daily at 6 PM.   b complex vitamins capsule Take 1 capsule by mouth daily.   Diclofenac Sodium 2 % Soln Apply 1 pump twice daily as needed. What changed:    how much to take  how to take this  when to take this  reasons to take this  additional instructions   loratadine 10 MG tablet Commonly known as:  CLARITIN Take 10 mg by mouth daily.   metoprolol tartrate 25 MG tablet Commonly known as:  LOPRESSOR Take 1 tablet (25 mg total) by mouth 2 (two) times daily.   multivitamin with minerals Tabs tablet Take 1 tablet by mouth daily.   oxyCODONE-acetaminophen 5-325 MG tablet Commonly known as:  PERCOCET/ROXICET Take 1-2 tablets by mouth every 6 (six) hours as needed for moderate pain.   senna-docusate 8.6-50 MG tablet Commonly known as:  Senokot-S Take 1 tablet by mouth daily.   tadalafil 5 MG tablet Commonly known as:  CIALIS Take 5 mg by mouth daily as needed for erectile dysfunction.   triamcinolone 55 MCG/ACT Aero nasal inhaler Commonly known as:  NASACORT Place 2 sprays into the nose daily as needed (congestion).   Vitamin D (Ergocalciferol) 50000 units Caps capsule Commonly known as:  DRISDOL Take 1 capsule  (50,000 Units total) by mouth every 7 (seven) days. What changed:  additional instructions   zolpidem 10 MG tablet Commonly known as:  AMBIEN Take 10 mg by mouth at bedtime as needed for sleep.       Vitals:   02/27/18 0430 02/27/18 0754  BP: (!) 131/58 113/60  Pulse: 84 86  Resp: 18 18  Temp: 99.1 F (37.3 C) 98.2 F (36.8 C)  SpO2: 95% 94%    Tele box removed and returned. Skin clean, dry and intact without evidence of skin break down, no evidence of skin tears noted. IV catheter discontinued intact. Site without signs and symptoms of complications. Dressing and pressure applied. Right upper chest port deaccessed.Pt denies pain at this time. No complaints noted.  An After Visit Summary was printed and given to the patient. Patient escorted via [place], and D/C home via private auto.  [name]

## 2018-02-27 NOTE — Care Management Note (Addendum)
Case Management Note  Patient Details  Name: Ruben Eaton MRN: [id number] Date of Birth: 1971-06-28  Subjective/Objective:   Spoke with wife regarding discharging planning. Patient is discharging today with Pleurex catheters. Supply to provide patient with 5-6 to take home with him. Wife would like a RN to visit 2-3 times for education on pleurex drain. No preference of home health agencies. Referral to [facility] with [facility] for RN. [name] states that [facility] will set up [name] to deliver the remaining drainage supplies.                   Action/Plan: Doroteo Bradford, primary nurse updated.   Expected Discharge Date:  02/27/18               Expected Discharge Plan:  [place]  In-House Referral:     Discharge planning Services  CM Consult  Post Acute Care Choice:  Home Health Choice offered to:  Patient, Spouse  DME Arranged:    DME Agency:     HH Arranged:  RN Cora Agency:  [facility]  Status of Service:  In process, will continue to follow  If discussed at Long Length of Stay Meetings, dates discussed:    Additional Comments:  Jolly Mango, RN 02/27/2018, 10:01 AM

## 2018-02-27 NOTE — Care Management (Signed)
Ruben Eaton went in to speak with patient and his wife. They both refused home health. They had just been demonstrated the pleurex drainage technique and felt like they were comfortable with it. Wife also already had information to orders drainage kits. Home health canceled.

## 2018-02-28 ENCOUNTER — Other Ambulatory Visit: Payer: Self-pay | Admitting: *Deleted

## 2018-02-28 ENCOUNTER — Telehealth: Payer: Self-pay | Admitting: Cardiothoracic Surgery

## 2018-02-28 ENCOUNTER — Other Ambulatory Visit: Payer: Self-pay | Admitting: Pathology

## 2018-02-28 LAB — SURGICAL PATHOLOGY

## 2018-02-28 NOTE — Patient Outreach (Signed)
[facility]) Care Management  02/28/2018  ZEDRICK SPRINGSTEEN [birth year] [id number]   Subjective: Telephone call to patient's home number, no answer, left HIPAA compliant voicemail message, and requested call back.     Objective: Per KPN (Knowledge Performance Now, point of care tool) and chart review,  Patient hospitalized 02/21/18 -02/27/18 for Acute right malignant pleural effusion, and status post preoperative bronchoscopy to assess endobronchial anatomy, right thoracoscopy with talc pleurodesis, pleural biopsy, #3 insertion of Pleurx catheter #4 insertion of Port-A-Cath using ultrasound and vascular guidance on 02/23/18.   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    Assessment: Received Focus Plan Transition of care referral on 02/26/18.   Transition of care follow up pending patient contact.      Plan:  RNCM will send unsuccessful outreach  letter, [facility] pamphlet, will call patient for 2nd telephone outreach attempt, transition of care follow up, and proceed with case closure, within 10 business days if no return call.      Tra Wilemon H. Annia Friendly, BSN, [place] Management [facility] Telephonic CM Phone: [phone] Fax: [phone]

## 2018-02-28 NOTE — Telephone Encounter (Signed)
Patients wife is calling and needs [facility] to send supplies in the future. Please call patients wife and advise.

## 2018-03-01 ENCOUNTER — Ambulatory Visit
Admit: 2018-03-01 | Discharge: 2018-03-01 | Disposition: A | Discharge status: Home / Self Care | Payer: No Typology Code available for payment source | Attending: Internal Medicine | Admitting: Internal Medicine

## 2018-03-01 ENCOUNTER — Telehealth: Payer: Self-pay

## 2018-03-01 ENCOUNTER — Other Ambulatory Visit: Payer: Self-pay | Admitting: *Deleted

## 2018-03-01 ENCOUNTER — Inpatient Hospital Stay: Payer: No Typology Code available for payment source | Attending: Internal Medicine

## 2018-03-01 ENCOUNTER — Other Ambulatory Visit: Payer: Self-pay

## 2018-03-01 DIAGNOSIS — C3431 Malignant neoplasm of lower lobe, right bronchus or lung: Secondary | ICD-10-CM

## 2018-03-01 LAB — GLUCOSE, CAPILLARY: GLUCOSE-CAPILLARY: 86 mg/dL (ref 65–99)

## 2018-03-01 MED ORDER — FLUDEOXYGLUCOSE F - 18 (FDG) INJECTION
9.1000 | Freq: Once | INTRAVENOUS | Status: AC | PRN
  Administered 2018-03-01: 9.62 via INTRAVENOUS

## 2018-03-01 NOTE — Patient Outreach (Addendum)
[facility]) Care Management  03/01/2018  Ruben Eaton May 03, 1971 [id number]   Subjective: Telephone call to patient's home number, spoke with patient, and HIPAA verified.  Discussed [facility] Care Management Focus Plan Transition of care follow up, patient voiced understanding, and is in agreement to follow up. Patient states is doing ok, currently driving, is 5 minutes from home, and will call this RNCM back when he gets home. Telephone call from patient and HIPAA verified.   Patient states he is doing ok, has a follow up appointment with cardiologist on 03/14/18, with primary MD on 03/08/18, and with oncologist on 03/02/18.   States he is having a problem obtaining supplies (Pleurex drainage) from preferred durable medical equipment provider, [facility], since he is unable to use [facility], as a provider due to insurance plan.     States he and his wife have been working with Ivin Booty at [facility] to resolve issues and will reach out to this [facility] if assistance needed.   States he received several days of supplies from local [place] supply store ([facility]) on 02/28/18.  States [facility] is suppose to call him back today with an update, and he will call this RNCM, if he does not receive a call back from [facility] by 2:30pm on 03/11/18. Patient states he is able to manage self care and has assistance as needed with activities of daily living / home management.   Patient voices understanding of medical diagnosis, procedure, and treatment plan.  States he is accessing the following [facility] benefits: outpatient pharmacy, hospital indemnity (not chosen , and will call [employer] today to start family medical leave act (FMLA) process.   Patient verbally given contact number for [employer] [phone], advised he will be need to obtain a copy of all FMLA paperwork for his records, and may need to follow up to ensure paperwork completed in a timely fashion.   Patient  voiced understanding, and states he will follow up to file a claim via online or via phone.  Patient states he does not have any education material, transition of care, care coordination, disease management, disease monitoring, transportation, community resource, or pharmacy needs at this time.  States he is very appreciative of the follow up and is in agreement to receive [place] Management information.      Objective: Per KPN (Knowledge Performance Now, point of care tool) and chart review,  Patient hospitalized 02/21/18 -02/27/18 for Acute right malignant pleural effusion, and status post preoperative bronchoscopy to assess endobronchial anatomy, right thoracoscopy with talc pleurodesis, pleural biopsy, #3 insertion of Pleurx catheter #4 insertion of Port-A-Cath using ultrasound and vascular guidance on 02/23/18.   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    Assessment: Received Focus Plan Transition of care referral on 02/26/18.   Transition of care follow up completed, no care management needs, and will proceed with case closure.      Plan:  RNCM will send patient successful outreach letter, [facility] pamphlet, and magnet. RNCM will complete case closure due to follow up completed / no care management needs.      Richerd Grime H. Annia Friendly, BSN, [place] Management [facility] Telephonic CM Phone: [phone] Fax: [phone]

## 2018-03-01 NOTE — Telephone Encounter (Signed)
Left msg on vm to call and schedule 24 hour monitor

## 2018-03-01 NOTE — Telephone Encounter (Signed)
Patients wife is calling again asking for one of the nurses to give her a call back.

## 2018-03-01 NOTE — Telephone Encounter (Signed)
-----   Message from Theora Gianotti, NP sent at 02/27/2018  1:55 PM EDT ----- S,  Another request.  Sorry.  Blame the pts.  This dude is unfortunate.  I'll just say that.  He needs a 24 hr holter.  He will f/u in [place] but he lives this way, so could we set up here?  I'll place order.  Thanks,  CB

## 2018-03-01 NOTE — Telephone Encounter (Signed)
Pt schedule monitor placement appt for 6/11. [facility] pt of [place] outpatient facility charge for use on monitor. Patient understood.

## 2018-03-02 ENCOUNTER — Other Ambulatory Visit: Payer: Self-pay

## 2018-03-02 ENCOUNTER — Encounter: Payer: Self-pay | Admitting: *Deleted

## 2018-03-02 ENCOUNTER — Encounter: Payer: Self-pay | Admitting: Oncology

## 2018-03-02 ENCOUNTER — Inpatient Hospital Stay: Payer: No Typology Code available for payment source | Attending: Oncology | Admitting: Oncology

## 2018-03-02 VITALS — BP 124/58 | HR 87 | Temp 97.6°F | Wt 171.0 lb

## 2018-03-02 DIAGNOSIS — R61 Generalized hyperhidrosis: Secondary | ICD-10-CM | POA: Insufficient documentation

## 2018-03-02 DIAGNOSIS — I10 Essential (primary) hypertension: Secondary | ICD-10-CM

## 2018-03-02 DIAGNOSIS — R0602 Shortness of breath: Secondary | ICD-10-CM | POA: Insufficient documentation

## 2018-03-02 DIAGNOSIS — Z9981 Dependence on supplemental oxygen: Secondary | ICD-10-CM | POA: Insufficient documentation

## 2018-03-02 DIAGNOSIS — Z9989 Dependence on other enabling machines and devices: Secondary | ICD-10-CM

## 2018-03-02 DIAGNOSIS — Z792 Long term (current) use of antibiotics: Secondary | ICD-10-CM | POA: Diagnosis not present

## 2018-03-02 DIAGNOSIS — Z5111 Encounter for antineoplastic chemotherapy: Secondary | ICD-10-CM | POA: Diagnosis present

## 2018-03-02 DIAGNOSIS — R600 Localized edema: Secondary | ICD-10-CM | POA: Diagnosis not present

## 2018-03-02 DIAGNOSIS — Z7982 Long term (current) use of aspirin: Secondary | ICD-10-CM | POA: Diagnosis not present

## 2018-03-02 DIAGNOSIS — J910 Malignant pleural effusion: Secondary | ICD-10-CM | POA: Diagnosis not present

## 2018-03-02 DIAGNOSIS — Z79899 Other long term (current) drug therapy: Secondary | ICD-10-CM | POA: Diagnosis not present

## 2018-03-02 DIAGNOSIS — C3431 Malignant neoplasm of lower lobe, right bronchus or lung: Secondary | ICD-10-CM

## 2018-03-02 DIAGNOSIS — J9600 Acute respiratory failure, unspecified whether with hypoxia or hypercapnia: Secondary | ICD-10-CM | POA: Diagnosis not present

## 2018-03-02 DIAGNOSIS — R9389 Abnormal findings on diagnostic imaging of other specified body structures: Secondary | ICD-10-CM | POA: Diagnosis not present

## 2018-03-02 DIAGNOSIS — G4700 Insomnia, unspecified: Secondary | ICD-10-CM

## 2018-03-02 DIAGNOSIS — Q211 Atrial septal defect: Secondary | ICD-10-CM

## 2018-03-02 DIAGNOSIS — J189 Pneumonia, unspecified organism: Secondary | ICD-10-CM | POA: Insufficient documentation

## 2018-03-02 DIAGNOSIS — R5383 Other fatigue: Secondary | ICD-10-CM | POA: Insufficient documentation

## 2018-03-02 DIAGNOSIS — G4733 Obstructive sleep apnea (adult) (pediatric): Secondary | ICD-10-CM

## 2018-03-02 DIAGNOSIS — R5381 Other malaise: Secondary | ICD-10-CM | POA: Insufficient documentation

## 2018-03-02 DIAGNOSIS — F419 Anxiety disorder, unspecified: Secondary | ICD-10-CM | POA: Diagnosis not present

## 2018-03-02 DIAGNOSIS — R630 Anorexia: Secondary | ICD-10-CM | POA: Insufficient documentation

## 2018-03-02 DIAGNOSIS — Z7189 Other specified counseling: Secondary | ICD-10-CM | POA: Insufficient documentation

## 2018-03-02 DIAGNOSIS — R9431 Abnormal electrocardiogram [ECG] [EKG]: Secondary | ICD-10-CM | POA: Insufficient documentation

## 2018-03-02 DIAGNOSIS — Z87891 Personal history of nicotine dependence: Secondary | ICD-10-CM

## 2018-03-02 DIAGNOSIS — R590 Localized enlarged lymph nodes: Secondary | ICD-10-CM

## 2018-03-02 DIAGNOSIS — Z8673 Personal history of transient ischemic attack (TIA), and cerebral infarction without residual deficits: Secondary | ICD-10-CM

## 2018-03-02 MED ORDER — LORAZEPAM 0.5 MG PO TABS: 0.5000 mg | ORAL_TABLET | Freq: Three times a day (TID) | ORAL | 0 refills | Status: DC | PRN

## 2018-03-02 NOTE — Progress Notes (Signed)
[email or web address]

## 2018-03-02 NOTE — Progress Notes (Addendum)
Hematology/Oncology Follow Up Note [facility]  [phone] [phone]  Patient Care Team: Dion Body, MD as PCP - General (Family Medicine) [name], MD as PCP - Cardiology (Cardiology) Telford Nab, RN as Registered Nurse   Name of the patient: Ruben Eaton  [id number]  10/05/1970   [facility] follow up to discuss about cancer diagnosis and treatment plan.   HPI 47 year old male with history of hypertension, previous stroke, PFO, who was recently hospitalized due to shortness of breath and found to have large pleural effusion.  Status post paracentesis which showed malignant cells.  Patient underwent talc pleurodesis, Pleurx catheter placement and pleural biopsy. Patient presents to discuss cancer diagnosis and treatment plan.  Patient was evaluated by Dr. Imelda Pillow during his hospitalization.  I am covering Dr. B to see this patient today.   Data Reviewed:  CT chest: Independently reviewed which showed large right-sided pleural effusion, 7 cm anterior pleural-based mass, multiple pleural-based mass.  Mediastinal stye no adenopathy. MRI brain was independently reviewed by me which showed no intracranial metastatic disease.  PET scan was independently reviewed by me imageand  which showed no hypermetabolic lymphadenopathy.  Right pleural effusion is decreased in size and the right pleural drain is seen in place.  Large masslike opacity in the anterior inferior right upper lobe is seen which abuts the anterior chest wall and measures about 12 x 6.5 cm.  SUV 12.  Bulky hypermetabolic soft tissue density is seen throughout the right pleural space, consistent with pleural metastatic disease.  Small left pleural effusion seen without FDG uptake.  No abnormal hypermetabolic activity within the liver pancreas adrenal glands and spleen.  No evidence of metastatic disease within the neck abdomen or pelvis.  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  Patient is accompanied by his wife.   SOB: He reports shortness of breath at baseline, has not gotten worse since discharge.  There has not been much drainage from Pleurx.  Denies any pain.  Fatigue: stable.    Review of Systems  Constitutional: Negative for chills, fever, malaise/fatigue and weight loss.  HENT: Negative for congestion, ear discharge, ear pain, nosebleeds, sinus pain and sore throat.   Eyes: Negative for double vision, photophobia, pain, discharge and redness.  Respiratory: Positive for shortness of breath. Negative for cough, hemoptysis, sputum production and wheezing.   Cardiovascular: Negative for chest pain, palpitations, orthopnea, claudication and leg swelling.  Gastrointestinal: Negative for abdominal pain, blood in stool, constipation, diarrhea, heartburn, melena, nausea and vomiting.  Genitourinary: Negative for dysuria, flank pain, frequency and hematuria.  Musculoskeletal: Negative for back pain, myalgias and neck pain.  Skin: Negative for itching and rash.  Neurological: Negative for dizziness, tingling, tremors, focal weakness, weakness and headaches.  Endo/Heme/Allergies: Negative for environmental allergies. Does not bruise/bleed easily.  Psychiatric/Behavioral: Negative for depression and hallucinations. The patient is not nervous/anxious.       No Known Allergies  Past Medical History:  Diagnosis Date  . 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 09/15/2016 in setting of PFO.  . CVA (cerebral vascular accident) ([place]) 09/07/2016  . ED (erectile dysfunction)   . Essential (primary) hypertension 02/01/2016  . History of stroke 10/08/2016  . Hx of insomnia   . Hypertension   . OSA on CPAP   . PFO (patent foramen ovale)    a. s/p closure 09/2016 by Dr. [name].  . Pleural effusion on right 02/21/2018  . Primary cancer of  right lower lobe of lung ([place]) 02/25/2018  . PVC's (premature  ventricular contractions) 10/08/2016  . 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 TIA (transient ischemic attack) 09/07/2016     Past Surgical History:  Procedure Laterality Date  . APPENDECTOMY  1980  . EYE SURGERY    . PATENT FORAMEN OVALE CLOSURE  10/07/2016  . PATENT FORAMEN OVALE CLOSURE N/A 10/07/2016   Procedure: Patent For[place](PFO) Closure;  Surgeon: [name], MD;  Location: [place] [facility];  Service: Cardiovascular;  Laterality: N/A;  . PORTACATH PLACEMENT N/A 02/23/2018   Procedure: INSERTION PORT-A-CATH;  Surgeon: Nestor Lewandowsky, MD;  Location: [facility] ORS;  Service: Thoracic;  Laterality: N/A;  . STRABISMUS SURGERY Left 1984  . TEE WITHOUT CARDIOVERSION N/A 09/08/2016   Procedure: TRANSESOPHAGEAL ECHOCARDIOGRAM (TEE);  Surgeon: [name], MD;  Location: [facility] ORS;  Service: Cardiovascular;  Laterality: N/A;  . VIDEO ASSISTED THORACOSCOPY (VATS) W/TALC PLEUADESIS Right 02/23/2018   Procedure: VIDEO ASSISTED THORACOSCOPY (VATS) W/TALC PLEUADESIS;  Surgeon: Nestor Lewandowsky, MD;  Location: [facility] OR[facility];  Service: Thoracic;  Laterality: Right;  . VITRECTOMY Right 2013    Social History   Socioeconomic History  . Marital status: Married    Spouse name: [name]   . Number of children: Not on file  . Years of education: Not on file  . Highest education level: Not on file  Occupational History  . Occupation: Software engineer  Social Needs  . Financial resource strain: Not on file  . Food insecurity:    Worry: Not on file    Inability: Not on file  . Transportation needs:    Medical: Not on file    Non-medical: Not on file  Tobacco Use  . Smoking status: Former Smoker    Packs/day: 0.50    Years: 10.00    Pack years: 5.00    Last attempt to quit: 2011    Years since quitting: 8.4  . Smokeless tobacco: Former Systems developer    Types: Snuff  Substance and Sexual Activity  . Alcohol use: Yes    Alcohol/week: 1.8 oz    Types: 3 Cans of beer per week  . Drug  use: No  . Sexual activity: Yes  Lifestyle  . Physical activity:    Days per week: Not on file    Minutes per session: Not on file  . Stress: Not on file  Relationships  . Social connections:    Talks on phone: Not on file    Gets together: Not on file    Attends religious service: Not on file    Active member of club or organization: Not on file    Attends meetings of clubs or organizations: Not on file    Relationship status: Not on file  . Intimate partner violence:    Fear of current or ex partner: Not on file    Emotionally abused: Not on file    Physically abused: Not on file    Forced sexual activity: Not on file  Other Topics Concern  . Not on file  Social History Narrative  . Not on file    Family History  Problem Relation Age of Onset  . Other Paternal Uncle        Abdominal tumor      Current Outpatient Medications:  .  aspirin EC 81 MG EC tablet, Take 1 tablet (81 mg total) by mouth daily., Disp: 120 tablet, Rfl: 0 .  atorvastatin (LIPITOR) 40 MG tablet, Take 1  tablet (40 mg total) by mouth daily at 6 PM. (Patient taking differently: Take 20 mg by mouth daily at 6 PM. ), Disp: 30 tablet, Rfl: 0 .  b complex vitamins capsule, Take 1 capsule by mouth daily. , Disp: , Rfl:  .  Diclofenac Sodium 2 % SOLN, Apply 1 pump twice daily as needed. (Patient taking differently: Apply 1 application topically 2 (two) times daily as needed (pain). Apply 1 pump twice daily as needed.), Disp: 112 g, Rfl: 3 .  loratadine (CLARITIN) 10 MG tablet, Take 10 mg by mouth daily. , Disp: , Rfl:  .  metoprolol tartrate (LOPRESSOR) 25 MG tablet, Take 1 tablet (25 mg total) by mouth 2 (two) times daily., Disp: 60 tablet, Rfl: 2 .  Multiple Vitamin (MULTIVITAMIN WITH MINERALS) TABS tablet, Take 1 tablet by mouth daily., Disp: , Rfl:  .  oxyCODONE-acetaminophen (PERCOCET/ROXICET) 5-325 MG tablet, Take 1-2 tablets by mouth every 6 (six) hours as needed for moderate pain., Disp: 30 tablet, Rfl:  0 .  senna-docusate (SENOKOT-S) 8.6-50 MG tablet, Take 1 tablet by mouth daily., Disp: 30 tablet, Rfl: 0 .  tadalafil (CIALIS) 5 MG tablet, Take 5 mg by mouth daily as needed for erectile dysfunction., Disp: , Rfl:  .  triamcinolone (NASACORT) 55 MCG/ACT AERO nasal inhaler, Place 2 sprays into the nose daily as needed (congestion). , Disp: , Rfl:  .  Vitamin D, Ergocalciferol, (DRISDOL) 50000 units CAPS capsule, Take 1 capsule (50,000 Units total) by mouth every 7 (seven) days. (Patient taking differently: Take 50,000 Units by mouth every 7 (seven) days. Mondays), Disp: 12 capsule, Rfl: 0 .  zolpidem (AMBIEN) 10 MG tablet, Take 10 mg by mouth at bedtime as needed for sleep. , Disp: , Rfl:  .  LORazepam (ATIVAN) 0.5 MG tablet, Take 1 tablet (0.5 mg total) by mouth every 8 (eight) hours as needed for anxiety., Disp: 30 tablet, Rfl: 0  Physical exam:  Vitals:   03/02/18 0849  BP: (!) 124/58  Pulse: 87  Temp: 97.6 F (36.4 C)  TempSrc: Tympanic  SpO2: 97%  Weight: 171 lb (77.6 kg)   Physical Exam  Constitutional: He is oriented to person, place, and time. He appears well-developed and well-nourished. No distress.  HENT:  Head: Normocephalic and atraumatic.  Right Ear: External ear normal.  Left Ear: External ear normal.  Mouth/Throat: Oropharynx is clear and moist.  Eyes: Pupils are equal, round, and reactive to light. Conjunctivae and EOM are normal. No scleral icterus.  Neck: Normal range of motion. Neck supple.  Cardiovascular: Normal rate, regular rhythm and normal heart sounds.  Pulmonary/Chest: Effort normal. No respiratory distress. He has no wheezes. He has no rales. He exhibits no tenderness.  pleurx in place.   Abdominal: Soft. Bowel sounds are normal. He exhibits no distension and no mass. There is no tenderness.  Musculoskeletal: Normal range of motion. He exhibits no edema or deformity.  Lymphadenopathy:    He has no cervical adenopathy.  Neurological: He is alert and  oriented to person, place, and time. No cranial nerve deficit. Coordination normal.  Skin: Skin is warm and dry. No rash noted.  Psychiatric: He has a normal mood and affect. His behavior is normal. Thought content normal.    CMP Latest Ref Rng & Units 02/26/2018  Glucose 65 - 99 mg/dL 96  BUN 6 - 20 mg/dL 17  Creatinine 0.61 - 1.24 mg/dL 0.96  Sodium 135 - 145 mmol/L 133(L)  Potassium 3.5 - 5.1 mmol/L 4.0  Chloride 101 - 111 mmol/L 101  CO2 22 - 32 mmol/L 26  Calcium 8.9 - 10.3 mg/dL 8.0(L)  Total Protein 6.5 - 8.1 g/dL -  Total Bilirubin 0.3 - 1.2 mg/dL -  Alkaline Phos 38 - 126 U/L -  AST 15 - 41 U/L -  ALT 17 - 63 U/L -   CBC Latest Ref Rng & Units 02/23/2018  WBC 3.8 - 10.6 K/uL 8.4  Hemoglobin 13.0 - 18.0 g/dL 13.7  Hematocrit 40.0 - 52.0 % 38.4(L)  Platelets 150 - 440 K/uL 291    Dg Chest 2 View  Result Date: 02/21/2018 CLINICAL DATA:  Shortness of breath and chest pain beginning 3 days ago. EXAM: CHEST - 2 VIEW COMPARISON:  07/21/2006 FINDINGS: Left hemithorax is clear. ASD closure device overlies the heart. There is a large right effusion with near complete collapse of the right lung. Partial aeration of the right upper lobe. Mediastinum bows towards the left. No significant bone finding. IMPRESSION: Large right effusion with near complete collapse of the right lung. Partial aeration of the right upper lobe. Mediastinal shift towards the left. Left chest otherwise clear. ASD closure device. Electronically Signed   By: Nelson Chimes M.D.   On: 02/21/2018 16:20   Ct Chest W Contrast  Result Date: 02/21/2018 CLINICAL DATA:  Patient with shortness of breath. History of bronchitis. Large right pleural effusion. EXAM: CT CHEST WITH CONTRAST TECHNIQUE: Multidetector CT imaging of the chest was performed during intravenous contrast administration. CONTRAST:  37m ISOVUE-370 IOPAMIDOL (ISOVUE-370) INJECTION 76% COMPARISON:  Chest radiograph 02/21/2018 FINDINGS: Cardiovascular: Normal  heart size. Trace fluid superior pericardial recess. Normal caliber aorta and main pulmonary artery. Mediastinum/Nodes: Leftward mediastinal shift secondary to large right pleural effusion. Enlarged mediastinal lymph nodes are demonstrated including a 1.3 cm prevascular lymph node (image 55; series 2). There is a 0.8 cm right paratracheal lymph node (image 52; series 2). Esophagus is poorly visualized. Lungs/Pleura: Mild leftward shift of the tracheal air column. There is a 6 mm nodule within the left lower lobe (image 121; series 3). There is a 6 mm left lower lobe nodule (image 79; series 3). There is a 5 mm nodule in the lingula (image 55; series 3). There is a 3 mm nodule within the left upper lobe (image 41; series 3). There is a large right pleural effusion with near total collapse of the right lung. There is minimal aeration of the right upper lobe. Multiple enhancing predominantly pleural-based nodules are demonstrated throughout the right hemithorax. There is a more dominant pleural-based nodule demonstrated within the anterior right upper hemithorax which measures up to 7 cm. Upper Abdomen: Unremarkable. Musculoskeletal: Thoracic spine degenerative changes. No aggressive or acute appearing osseous lesions. IMPRESSION: 1. Multiple enhancing pleural-based nodules are demonstrated throughout the right hemithorax with large right pleural effusion most concerning for pleural-based malignancy/metastatic disease. There is a more dominant pleural-based mass within the anterior right hemithorax. 2. Multiple enlarged mediastinal lymph nodes concerning for nodal metastatic disease. 3. Multiple nodules within the left lung concerning for the possibility of pulmonary metastatic disease. 4. These results were called by telephone at the time of interpretation on 02/21/2018 at 5:58 pm. Electronically Signed   By: DLovey NewcomerM.D.   On: 02/21/2018 18:00   Mr BJeri CosWYKContrast  Result Date: 02/24/2018 CLINICAL DATA:   New diagnosis lung carcinoma. EXAM: MRI HEAD WITHOUT AND WITH CONTRAST TECHNIQUE: Multiplanar, multiecho pulse sequences of the brain and surrounding structures were obtained without and with  intravenous contrast. CONTRAST:  81m MULTIHANCE GADOBENATE DIMEGLUMINE 529 MG/ML IV SOLN COMPARISON:  None. FINDINGS: Brain: Diffusion imaging does not show any acute or subacute infarction. Brainstem is normal. 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 There is old infarction in the deep insula and right posterior frontal brain. Mild chronic small-vessel changes seen elsewhere within the hemispheric white matter. There is no evidence of metastatic disease. No hemorrhage, hydrocephalus or extra-axial collection. Vascular: Major vessels at the base of the brain show flow. Skull and upper cervical spine: Negative Sinuses/Orbits: Clear/normal Other: None IMPRESSION: No evidence of metastatic disease.  Old ischemic changes as above. Electronically Signed   By: MNelson ChimesM.D.   On: 02/24/2018 11:58   Nm Pet Image Initial (pi) Skull Base To Thigh  Result Date: 03/01/2018 CLINICAL DATA:  Initial treatment strategy for right lung carcinoma. EXAM: NUCLEAR MEDICINE PET SKULL BASE TO THIGH TECHNIQUE: 9.6 mCi F-18 FDG was injected intravenously. Full-ring PET imaging was performed from the skull base to thigh after the radiotracer. CT data was obtained and used for attenuation correction and anatomic localization. Fasting blood glucose: 86 mg/dl COMPARISON:  Chest CT on 02/21/2018 FINDINGS: (Mediastinal blood pool activity: SUV max = 1.7) NECK:  No hypermetabolic lymph nodes or masses. Incidental CT findings:  None. CHEST: No hypermetabolic lymphadenopathy. Right pleural effusion is decreased in size since previous study and right pleural drain is seen in place. Large masslike opacity in the anterior inferior right upper lobe is seen which abuts the anterior chest wall and measures approximately 12.0 x 6.5 cm on image 87/3.  This has SUV max of 12.0, and is suspicious for primary bronchogenic carcinoma. Bulky hypermetabolic soft tissue density is seen throughout the right pleural space, consistent with pleural metastatic disease. Small left pleural effusion is seen, without FDG uptake. No suspicious left lung nodules or masses identified. Incidental CT findings:  None. ABDOMEN/PELVIS: No abnormal hypermetabolic activity within the liver, pancreas, adrenal glands, or spleen. No hypermetabolic lymph nodes in the abdomen or pelvis. Incidental CT findings:  Mild ascites seen, without FDG uptake. SKELETON: No focal hypermetabolic bone lesions to suggest skeletal metastasis. Incidental CT findings:  None. IMPRESSION: Large hypermetabolic masslike opacity in the anterior right upper lobe, suspicious for primary bronchogenic carcinoma. Bulky hypermetabolic soft tissue density throughout the right pleural space, consistent with metastatic disease. No evidence of metastatic disease within the neck, abdomen, or pelvis. Electronically Signed   By: JEarle GellM.D.   On: 03/01/2018 11:28   Dg Chest Port 1 View  Result Date: 02/26/2018 CLINICAL DATA:  Chest tube in place.  Lung cancer. EXAM: PORTABLE CHEST 1 VIEW COMPARISON:  One-view chest x-ray 02/23/2018 FINDINGS: Heart size is normal. A right IJ Port-A-Cath is stable. Two right-sided chest tubes are stable in position. Interstitial and airspace disease is increasing on the right. A right pleural effusion is suspected. The left lung is clear. The visualized soft tissues and bony thorax are unremarkable. No residual pneumothorax is present. IMPRESSION: 1. Increasing right pleural effusion and airspace disease. 2. PleurX catheter and right-sided chest tube stable in position. 3. Right IJ catheter stable. Electronically Signed   By: CSan MorelleM.D.   On: 02/26/2018 07:17   Dg Chest Port 1 View  Result Date: 02/23/2018 CLINICAL DATA:  Postoperative chest x-ray.  Tube placements. EXAM:  PORTABLE CHEST 1 VIEW COMPARISON:  02/22/2018. FINDINGS: PowerPort catheter with tip over cavoatrial junction. Two right chest tubes noted with tip over the right upper chest. Significant reduction  in right-sided pleural effusion with mild residual. No pneumothorax. Atelectatic changes are noted in the right upper and lower lungs. Follow-up exams to demonstrate resolution suggested. Heart size normal. No acute bony abnormality. IMPRESSION: 1.  PowerPort catheter noted with tip at the cavoatrial junction. 2. Two right chest tubes noted. Significant reduction in right-sided pleural effusion with mild residual. Atelectatic changes noted the right upper and lower lungs. Follow-up exams demonstrate resolution suggested. Electronically Signed   By: Marcello Moores  Register   On: 02/23/2018 16:10   Dg Chest Port 1 View  Result Date: 02/22/2018 CLINICAL DATA:  Post right-sided thoracentesis EXAM: PORTABLE CHEST 1 VIEW COMPARISON:  02/21/2018; chest CT-02/21/2018 FINDINGS: Interval reduction in persistent moderate-sized right-sided effusion post thoracentesis. No pneumothorax. Slight reduction in previously noted right to left mediastinal shift. Otherwise, unchanged cardiac silhouette and mediastinal contours with persistent obscuration of the right heart border secondary to right basilar consolidative opacities. Post PFO ligation. No new focal airspace opacities. The left hemithorax remains well aerated. No acute osseus abnormalities. IMPRESSION: Interval reduction in persistent moderate-sized right-sided effusion post thoracentesis. No pneumothorax. Electronically Signed   By: Sandi Mariscal M.D.   On: 02/22/2018 10:41   Dg C-arm 1-60 Min-no Report  Result Date: 02/23/2018 Fluoroscopy was utilized by the requesting physician.  No radiographic interpretation.   US Thoracentesis Asp Pleural Space W/img Guide  Result Date: 02/22/2018 INDICATION: No known primary, now with multiple pleural based masses and symptomatic  right-sided pleural effusion. Please perform ultrasound-guided thoracentesis for diagnostic and therapeutic purposes. EXAM: US THORACENTESIS ASP PLEURAL SPACE W/IMG GUIDE COMPARISON:  Chest radiograph - 02/21/2018; chest CT - 02/21/2018 MEDICATIONS: None. COMPLICATIONS: None immediate. TECHNIQUE: Informed written consent was obtained from the patient after a discussion of the risks, benefits and alternatives to treatment. A timeout was performed prior to the initiation of the procedure. Initial ultrasound scanning demonstrates a large anechoic right-sided pleural effusion. The lower chest was prepped and draped in the usual sterile fashion. 1% lidocaine was used for local anesthesia. An ultrasound image was saved for documentation purposes. An 8 Fr Safe-T-Centesis catheter was introduced. The thoracentesis was performed. The catheter was removed and a dressing was applied. The patient tolerated the procedure well without immediate post procedural complication. The patient was escorted to have an upright chest radiograph. FINDINGS: A total of approximately 2 liters of serous, slightly blood tinged fluid was removed. Requested samples were sent to the laboratory. IMPRESSION: Successful ultrasound-guided right sided thoracentesis yielding 2 liters of pleural fluid. Electronically Signed   By: Sandi Mariscal M.D.   On: 02/22/2018 11:06     Assessment and plan Patient is a 47 y.o. male with history of stroke, PFO, hypertension, newly diagnosed lung cancer present to discuss about pathology results and future treatment plans. 1. Primary cancer of right lower lobe of lung ([place])   2. Anxiety   3. 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  Patient has stage IV disease.  Consensus decision was to proceed with systemic treatment. I  discussed in detail about the cancer diagnosis, disease extent, prognosis with patient and her wife.  Unfortunately he has stage IV disease.    # Goal of care: Discussed with patient that his disease is not curable.  Treatment is with palliative intent. Given that he is not very symptomatic at this point, I will  wait for patient's NGS and PDL 1 expression before finalizing his treatment plan.  Sarcomatoid carcinoma of lung usually present with high PDL 1 expression or c-Met mutation. If he does not carry any targetable mutations, or low PDL 1 expression, consider starting systemic chemotherapy treatment with carboplatin and Taxol. Patient has already had a Mediport placed.  He will have chemotherapy education.  Hopefully to start chemotherapy/immunotherapy once we have NGS/PDL 1 expression results in the next week or tow weeks.  Patient knows to call if his breathing status get worse.  #Anxiety: Secondary to new cancer diagnosis.  He reports insomnia, and Ambien does not work.   Patient request anti-anxiety medication.  I sent prescription of Ativan 0.5 mg every 8 hours as needed for anxiety.  Advised patient not to combine Ativan with Ambien.  He voices understanding.    Follow-up appointment to be determined based on pending pathology results.  Plan to see patient on day 1 of chemotherapy/immunotherapy treatments. Total face to face encounter time for this patient visit was 60 min. >50% of the time was  spent in counseling and coordination of care.  Earlie Server, MD, PhD Hematology Oncology [employer] [facility] Pager- [phone] 03/02/2018

## 2018-03-02 NOTE — Progress Notes (Signed)
Patient here today for PET results. 

## 2018-03-02 NOTE — Progress Notes (Signed)
  Oncology Nurse Navigator Documentation  Navigator Location: [facility] (03/02/18 1000)   )Navigator Encounter Type: Follow-up Appt (03/02/18 1000)     Confirmed Diagnosis Date: 02/28/18 (03/02/18 1000)               Patient Visit Type: MedOnc (03/02/18 1000) Treatment Phase: Pre-Tx/Tx Discussion (03/02/18 1000) Barriers/Navigation Needs: Education;Coordination of Care (03/02/18 1000) Education: Understanding Cancer/ Treatment Options;Newly Diagnosed Cancer Education (03/02/18 1000) Interventions: Coordination of Care;Education (03/02/18 1000)   Coordination of Care: Appts;Chemo (03/02/18 1000) Education Method: Verbal;Written (03/02/18 1000)           met with patient during hospital follow up with Dr. Tasia Catchings. Dr. Tasia Catchings reviewed recent PET scan and pathology results with patient in Dr. Aletha Halim absence. All questions answered at the time of visit. Pt and wife given resources regarding diagnosis and supportive services available. Informed pt and wife the [facility] testing is pending at this time and that testing usually takes approximately 3 weeks but I will call today to get ETA on when to expect results so his follow up appt can be scheduled accordingly. Pt and wife verbalized understanding. Upcoming appts reviewed. No further questions or needs at this time. Instructed to call or email if has any further questions.     Time Spent with Patient: 60 (03/02/18 1000)

## 2018-03-05 ENCOUNTER — Inpatient Hospital Stay: Payer: No Typology Code available for payment source

## 2018-03-05 ENCOUNTER — Encounter: Payer: Self-pay | Admitting: Cardiothoracic Surgery

## 2018-03-05 ENCOUNTER — Ambulatory Visit
Admission: RE | Admit: 2018-03-05 | Discharge: 2018-03-05 | Disposition: A | Discharge status: Home / Self Care | Payer: No Typology Code available for payment source | Source: Ambulatory Visit | Attending: Cardiothoracic Surgery | Admitting: Cardiothoracic Surgery

## 2018-03-05 ENCOUNTER — Ambulatory Visit (INDEPENDENT_AMBULATORY_CARE_PROVIDER_SITE_OTHER): Payer: No Typology Code available for payment source | Admitting: Cardiothoracic Surgery

## 2018-03-05 VITALS — BP 128/71 | HR 45 | Temp 97.8°F | Ht 66.0 in | Wt 172.0 lb

## 2018-03-05 DIAGNOSIS — J90 Pleural effusion, not elsewhere classified: Secondary | ICD-10-CM | POA: Insufficient documentation

## 2018-03-05 DIAGNOSIS — C3431 Malignant neoplasm of lower lobe, right bronchus or lung: Secondary | ICD-10-CM | POA: Insufficient documentation

## 2018-03-05 NOTE — Progress Notes (Signed)
He returns today in follow-up.  He has had no new problems.  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  He has no fevers and occasional cough.  He also says that his shortness of breath has not improved entirely.  They have been draining his Pleurx on a daily basis and it is been only a few cc.  All of his wounds are clean dry and intact.  His lungs show diminished breath sounds throughout the right side and his heart is irregularly irregular.  His chest x-ray today was independently reviewed and shows no acute problems.  We removed all the sutures and Steri-Stripped his wound.  We did not make a return visit for him.  He will discuss with [name] the need for his Pleurx catheter and possible removal.  I explained to him that we can do this as an outpatient and probably here in the office as well.

## 2018-03-05 NOTE — Patient Instructions (Signed)
Pembrolizumab injection What is this medicine? PEMBROLIZUMAB (pem broe liz ue mab) is a monoclonal antibody. It is used to treat melanoma, head and neck cancer, Hodgkin lymphoma, non-small cell lung cancer, urothelial cancer, stomach cancer, and cancers that have a certain genetic condition. This medicine may be used for other purposes; ask your health care provider or pharmacist if you have questions. COMMON BRAND NAME(S): Keytruda What should I tell my health care provider before I take this medicine? They need to know if you have any of these conditions: -diabetes -immune system problems -inflammatory bowel disease -liver disease -lung or breathing disease -lupus -organ transplant -an unusual or allergic reaction to pembrolizumab, other medicines, foods, dyes, or preservatives -pregnant or trying to get pregnant -breast-feeding How should I use this medicine? This medicine is for infusion into a vein. It is given by a health care professional in a hospital or clinic setting. A special MedGuide will be given to you before each treatment. Be sure to read this information carefully each time. Talk to your pediatrician regarding the use of this medicine in children. While this drug may be prescribed for selected conditions, precautions do apply. Overdosage: If you think you have taken too much of this medicine contact a poison control center or emergency room at once. NOTE: This medicine is only for you. Do not share this medicine with others. What if I miss a dose? It is important not to miss your dose. Call your doctor or health care professional if you are unable to keep an appointment. What may interact with this medicine? Interactions have not been studied. Give your health care provider a list of all the medicines, herbs, non-prescription drugs, or dietary supplements you use. Also tell them if you smoke, drink alcohol, or use illegal drugs. Some items may interact with your  medicine. This list may not describe all possible interactions. Give your health care provider a list of all the medicines, herbs, non-prescription drugs, or dietary supplements you use. Also tell them if you smoke, drink alcohol, or use illegal drugs. Some items may interact with your medicine. What should I watch for while using this medicine? Your condition will be monitored carefully while you are receiving this medicine. You may need blood work done while you are taking this medicine. Do not become pregnant while taking this medicine or for 4 months after stopping it. Women should inform their doctor if they wish to become pregnant or think they might be pregnant. There is a potential for serious side effects to an unborn child. Talk to your health care professional or pharmacist for more information. Do not breast-feed an infant while taking this medicine or for 4 months after the last dose. What side effects may I notice from receiving this medicine? Side effects that you should report to your doctor or health care professional as soon as possible: -allergic reactions like skin rash, itching or hives, swelling of the face, lips, or tongue -bloody or black, tarry -breathing problems -changes in vision -chest pain -chills -constipation -cough -dizziness or feeling faint or lightheaded -fast or irregular heartbeat -fever -flushing -hair loss -low blood counts - this medicine may decrease the number of white blood cells, red blood cells and platelets. You may be at increased risk for infections and bleeding. -muscle pain -muscle weakness -persistent headache -signs and symptoms of high blood sugar such as dizziness; dry mouth; dry skin; fruity breath; nausea; stomach pain; increased hunger or thirst; increased urination -signs and symptoms of kidney  injury like trouble passing urine or change in the amount of urine -signs and symptoms of liver injury like dark urine, light-colored  stools, loss of appetite, nausea, right upper belly pain, yellowing of the eyes or skin -stomach pain -sweating -weight loss Side effects that usually do not require medical attention (report to your doctor or health care professional if they continue or are bothersome): -decreased appetite -diarrhea -tiredness This list may not describe all possible side effects. Call your doctor for medical advice about side effects. You may report side effects to FDA at 1-800-FDA-1088. Where should I keep my medicine? This drug is given in a hospital or clinic and will not be stored at home. NOTE: This sheet is a summary. It may not cover all possible information. If you have questions about this medicine, talk to your doctor, pharmacist, or health care provider.  2018 Elsevier/Gold Standard (2016-06-21 12:29:36) Paclitaxel injection What is this medicine? PACLITAXEL (PAK li TAX el) is a chemotherapy drug. It targets fast dividing cells, like cancer cells, and causes these cells to die. This medicine is used to treat ovarian cancer, breast cancer, and other cancers. This medicine may be used for other purposes; ask your health care provider or pharmacist if you have questions. COMMON BRAND NAME(S): Onxol, Taxol What should I tell my health care provider before I take this medicine? They need to know if you have any of these conditions: -blood disorders -irregular heartbeat -infection (especially a virus infection such as chickenpox, cold sores, or herpes) -liver disease -previous or ongoing radiation therapy -an unusual or allergic reaction to paclitaxel, alcohol, polyoxyethylated castor oil, other chemotherapy agents, other medicines, foods, dyes, or preservatives -pregnant or trying to get pregnant -breast-feeding How should I use this medicine? This drug is given as an infusion into a vein. It is administered in a hospital or clinic by a specially trained health care professional. Talk to your  pediatrician regarding the use of this medicine in children. Special care may be needed. Overdosage: If you think you have taken too much of this medicine contact a poison control center or emergency room at once. NOTE: This medicine is only for you. Do not share this medicine with others. What if I miss a dose? It is important not to miss your dose. Call your doctor or health care professional if you are unable to keep an appointment. What may interact with this medicine? Do not take this medicine with any of the following medications: -disulfiram -metronidazole This medicine may also interact with the following medications: -cyclosporine -diazepam -ketoconazole -medicines to increase blood counts like filgrastim, pegfilgrastim, sargramostim -other chemotherapy drugs like cisplatin, doxorubicin, epirubicin, etoposide, teniposide, vincristine -quinidine -testosterone -vaccines -verapamil Talk to your doctor or health care professional before taking any of these medicines: -acetaminophen -aspirin -ibuprofen -ketoprofen -naproxen This list may not describe all possible interactions. Give your health care provider a list of all the medicines, herbs, non-prescription drugs, or dietary supplements you use. Also tell them if you smoke, drink alcohol, or use illegal drugs. Some items may interact with your medicine. What should I watch for while using this medicine? Your condition will be monitored carefully while you are receiving this medicine. You will need important blood work done while you are taking this medicine. This medicine can cause serious allergic reactions. To reduce your risk you will need to take other medicine(s) before treatment with this medicine. If you experience allergic reactions like skin rash, itching or hives, swelling of the face, lips,  or tongue, tell your doctor or health care professional right away. In some cases, you may be given additional medicines to help  with side effects. Follow all directions for their use. This drug may make you feel generally unwell. This is not uncommon, as chemotherapy can affect healthy cells as well as cancer cells. Report any side effects. Continue your course of treatment even though you feel ill unless your doctor tells you to stop. Call your doctor or health care professional for advice if you get a fever, chills or sore throat, or other symptoms of a cold or flu. Do not treat yourself. This drug decreases your body's ability to fight infections. Try to avoid being around people who are sick. This medicine may increase your risk to bruise or bleed. Call your doctor or health care professional if you notice any unusual bleeding. Be careful brushing and flossing your teeth or using a toothpick because you may get an infection or bleed more easily. If you have any dental work done, tell your dentist you are receiving this medicine. Avoid taking products that contain aspirin, acetaminophen, ibuprofen, naproxen, or ketoprofen unless instructed by your doctor. These medicines may hide a fever. Do not become pregnant while taking this medicine. Women should inform their doctor if they wish to become pregnant or think they might be pregnant. There is a potential for serious side effects to an unborn child. Talk to your health care professional or pharmacist for more information. Do not breast-feed an infant while taking this medicine. Men are advised not to father a child while receiving this medicine. This product may contain alcohol. Ask your pharmacist or healthcare provider if this medicine contains alcohol. Be sure to tell all healthcare providers you are taking this medicine. Certain medicines, like metronidazole and disulfiram, can cause an unpleasant reaction when taken with alcohol. The reaction includes flushing, headache, nausea, vomiting, sweating, and increased thirst. The reaction can last from 30 minutes to several  hours. What side effects may I notice from receiving this medicine? Side effects that you should report to your doctor or health care professional as soon as possible: -allergic reactions like skin rash, itching or hives, swelling of the face, lips, or tongue -low blood counts - This drug may decrease the number of white blood cells, red blood cells and platelets. You may be at increased risk for infections and bleeding. -signs of infection - fever or chills, cough, sore throat, pain or difficulty passing urine -signs of decreased platelets or bleeding - bruising, pinpoint red spots on the skin, black, tarry stools, nosebleeds -signs of decreased red blood cells - unusually weak or tired, fainting spells, lightheadedness -breathing problems -chest pain -high or low blood pressure -mouth sores -nausea and vomiting -pain, swelling, redness or irritation at the injection site -pain, tingling, numbness in the hands or feet -slow or irregular heartbeat -swelling of the ankle, feet, hands Side effects that usually do not require medical attention (report to your doctor or health care professional if they continue or are bothersome): -bone pain -complete hair loss including hair on your head, underarms, pubic hair, eyebrows, and eyelashes -changes in the color of fingernails -diarrhea -loosening of the fingernails -loss of appetite -muscle or joint pain -red flush to skin -sweating This list may not describe all possible side effects. Call your doctor for medical advice about side effects. You may report side effects to FDA at 1-800-FDA-1088. Where should I keep my medicine? This drug is given in a hospital  or clinic and will not be stored at home. NOTE: This sheet is a summary. It may not cover all possible information. If you have questions about this medicine, talk to your doctor, pharmacist, or health care provider.  2018 Elsevier/Gold Standard (2015-07-14 19:58:00) Carboplatin  injection What is this medicine? CARBOPLATIN (KAR boe pla tin) is a chemotherapy drug. It targets fast dividing cells, like cancer cells, and causes these cells to die. This medicine is used to treat ovarian cancer and many other cancers. This medicine may be used for other purposes; ask your health care provider or pharmacist if you have questions. COMMON BRAND NAME(S): Paraplatin What should I tell my health care provider before I take this medicine? They need to know if you have any of these conditions: -blood disorders -hearing problems -kidney disease -recent or ongoing radiation therapy -an unusual or allergic reaction to carboplatin, cisplatin, other chemotherapy, other medicines, foods, dyes, or preservatives -pregnant or trying to get pregnant -breast-feeding How should I use this medicine? This drug is usually given as an infusion into a vein. It is administered in a hospital or clinic by a specially trained health care professional. Talk to your pediatrician regarding the use of this medicine in children. Special care may be needed. Overdosage: If you think you have taken too much of this medicine contact a poison control center or emergency room at once. NOTE: This medicine is only for you. Do not share this medicine with others. What if I miss a dose? It is important not to miss a dose. Call your doctor or health care professional if you are unable to keep an appointment. What may interact with this medicine? -medicines for seizures -medicines to increase blood counts like filgrastim, pegfilgrastim, sargramostim -some antibiotics like amikacin, gentamicin, neomycin, streptomycin, tobramycin -vaccines Talk to your doctor or health care professional before taking any of these medicines: -acetaminophen -aspirin -ibuprofen -ketoprofen -naproxen This list may not describe all possible interactions. Give your health care provider a list of all the medicines, herbs,  non-prescription drugs, or dietary supplements you use. Also tell them if you smoke, drink alcohol, or use illegal drugs. Some items may interact with your medicine. What should I watch for while using this medicine? Your condition will be monitored carefully while you are receiving this medicine. You will need important blood work done while you are taking this medicine. This drug may make you feel generally unwell. This is not uncommon, as chemotherapy can affect healthy cells as well as cancer cells. Report any side effects. Continue your course of treatment even though you feel ill unless your doctor tells you to stop. In some cases, you may be given additional medicines to help with side effects. Follow all directions for their use. Call your doctor or health care professional for advice if you get a fever, chills or sore throat, or other symptoms of a cold or flu. Do not treat yourself. This drug decreases your body's ability to fight infections. Try to avoid being around people who are sick. This medicine may increase your risk to bruise or bleed. Call your doctor or health care professional if you notice any unusual bleeding. Be careful brushing and flossing your teeth or using a toothpick because you may get an infection or bleed more easily. If you have any dental work done, tell your dentist you are receiving this medicine. Avoid taking products that contain aspirin, acetaminophen, ibuprofen, naproxen, or ketoprofen unless instructed by your doctor. These medicines may hide a  fever. Do not become pregnant while taking this medicine. Women should inform their doctor if they wish to become pregnant or think they might be pregnant. There is a potential for serious side effects to an unborn child. Talk to your health care professional or pharmacist for more information. Do not breast-feed an infant while taking this medicine. What side effects may I notice from receiving this medicine? Side effects  that you should report to your doctor or health care professional as soon as possible: -allergic reactions like skin rash, itching or hives, swelling of the face, lips, or tongue -signs of infection - fever or chills, cough, sore throat, pain or difficulty passing urine -signs of decreased platelets or bleeding - bruising, pinpoint red spots on the skin, black, tarry stools, nosebleeds -signs of decreased red blood cells - unusually weak or tired, fainting spells, lightheadedness -breathing problems -changes in hearing -changes in vision -chest pain -high blood pressure -low blood counts - This drug may decrease the number of white blood cells, red blood cells and platelets. You may be at increased risk for infections and bleeding. -nausea and vomiting -pain, swelling, redness or irritation at the injection site -pain, tingling, numbness in the hands or feet -problems with balance, talking, walking -trouble passing urine or change in the amount of urine Side effects that usually do not require medical attention (report to your doctor or health care professional if they continue or are bothersome): -hair loss -loss of appetite -metallic taste in the mouth or changes in taste This list may not describe all possible side effects. Call your doctor for medical advice about side effects. You may report side effects to FDA at 1-800-FDA-1088. Where should I keep my medicine? This drug is given in a hospital or clinic and will not be stored at home. NOTE: This sheet is a summary. It may not cover all possible information. If you have questions about this medicine, talk to your doctor, pharmacist, or health care provider.  2018 Elsevier/Gold Standard (2007-12-18 14:38:05)

## 2018-03-05 NOTE — Patient Instructions (Addendum)
We will fill out your paperwork for Pleurx catheter supplies and then it will be faxed to [facility].  Please follow up with Dr. Rogue Bussing. Once you need your Pleurx catheter to be removed, please let us know so we could schedule an appointment with Dr. Genevive Bi.

## 2018-03-05 NOTE — Telephone Encounter (Signed)
Called patient to let him know that I had faxed an order to [facility] at [phone].

## 2018-03-06 ENCOUNTER — Ambulatory Visit (INDEPENDENT_AMBULATORY_CARE_PROVIDER_SITE_OTHER): Payer: No Typology Code available for payment source

## 2018-03-06 DIAGNOSIS — I493 Ventricular premature depolarization: Secondary | ICD-10-CM

## 2018-03-07 ENCOUNTER — Encounter: Payer: Self-pay | Admitting: Oncology

## 2018-03-07 ENCOUNTER — Inpatient Hospital Stay (HOSPITAL_BASED_OUTPATIENT_CLINIC_OR_DEPARTMENT_OTHER): Payer: No Typology Code available for payment source | Admitting: Oncology

## 2018-03-07 ENCOUNTER — Ambulatory Visit
Admission: RE | Admit: 2018-03-07 | Discharge: 2018-03-07 | Disposition: A | Discharge status: Home / Self Care | Payer: No Typology Code available for payment source | Source: Ambulatory Visit | Attending: Oncology | Admitting: Oncology

## 2018-03-07 ENCOUNTER — Inpatient Hospital Stay: Payer: No Typology Code available for payment source

## 2018-03-07 ENCOUNTER — Other Ambulatory Visit: Payer: Self-pay

## 2018-03-07 ENCOUNTER — Other Ambulatory Visit: Payer: Self-pay | Admitting: *Deleted

## 2018-03-07 VITALS — BP 128/70 | HR 86 | Temp 97.2°F | Resp 16 | Wt 167.0 lb

## 2018-03-07 DIAGNOSIS — J189 Pneumonia, unspecified organism: Secondary | ICD-10-CM | POA: Diagnosis not present

## 2018-03-07 DIAGNOSIS — G4733 Obstructive sleep apnea (adult) (pediatric): Secondary | ICD-10-CM | POA: Diagnosis not present

## 2018-03-07 DIAGNOSIS — J910 Malignant pleural effusion: Secondary | ICD-10-CM

## 2018-03-07 DIAGNOSIS — Z7982 Long term (current) use of aspirin: Secondary | ICD-10-CM | POA: Diagnosis not present

## 2018-03-07 DIAGNOSIS — J90 Pleural effusion, not elsewhere classified: Secondary | ICD-10-CM | POA: Insufficient documentation

## 2018-03-07 DIAGNOSIS — Z8673 Personal history of transient ischemic attack (TIA), and cerebral infarction without residual deficits: Secondary | ICD-10-CM

## 2018-03-07 DIAGNOSIS — C3431 Malignant neoplasm of lower lobe, right bronchus or lung: Secondary | ICD-10-CM

## 2018-03-07 DIAGNOSIS — R61 Generalized hyperhidrosis: Secondary | ICD-10-CM

## 2018-03-07 DIAGNOSIS — Z87891 Personal history of nicotine dependence: Secondary | ICD-10-CM

## 2018-03-07 DIAGNOSIS — R0602 Shortness of breath: Secondary | ICD-10-CM | POA: Diagnosis present

## 2018-03-07 DIAGNOSIS — Z9989 Dependence on other enabling machines and devices: Secondary | ICD-10-CM

## 2018-03-07 DIAGNOSIS — Q211 Atrial septal defect: Secondary | ICD-10-CM

## 2018-03-07 DIAGNOSIS — R918 Other nonspecific abnormal finding of lung field: Secondary | ICD-10-CM | POA: Diagnosis not present

## 2018-03-07 DIAGNOSIS — F419 Anxiety disorder, unspecified: Secondary | ICD-10-CM

## 2018-03-07 DIAGNOSIS — Z79899 Other long term (current) drug therapy: Secondary | ICD-10-CM

## 2018-03-07 DIAGNOSIS — G4700 Insomnia, unspecified: Secondary | ICD-10-CM | POA: Diagnosis not present

## 2018-03-07 DIAGNOSIS — R590 Localized enlarged lymph nodes: Secondary | ICD-10-CM

## 2018-03-07 DIAGNOSIS — I10 Essential (primary) hypertension: Secondary | ICD-10-CM

## 2018-03-07 MED ORDER — PREDNISONE 10 MG (21) PO TBPK: ORAL_TABLET | ORAL | 0 refills | Status: DC

## 2018-03-07 MED ORDER — IPRATROPIUM-ALBUTEROL 0.5-2.5 (3) MG/3ML IN SOLN
3.0000 mL | Freq: Once | RESPIRATORY_TRACT | Status: AC
  Administered 2018-03-07: 3 mL via RESPIRATORY_TRACT
  Filled 2018-03-07: qty 3

## 2018-03-07 MED ORDER — LEVOFLOXACIN 500 MG PO TABS: 500.0000 mg | ORAL_TABLET | Freq: Every day | ORAL | 0 refills | Status: DC

## 2018-03-07 NOTE — Progress Notes (Signed)
Symptom Management Consult note [facility]  [phone] [phone]  Patient Care Team: Dion Body, MD as PCP - General (Family Medicine) [name], MD as PCP - Cardiology (Cardiology) Telford Nab, RN as Registered Nurse   Name of the patient: Ruben Eaton  [id number]  June 04, 1971   Date of visit: 03/07/18  Diagnosis-recently diagnosed stage IV lung cancer  Chief complaint/ Reason for visit-shortness of breath/cough  Heme/Onc history:   Patient was last seen by Dr. Tasia Catchings (who is covering for Dr. Rogue Bussing) to discuss cancer diagnosis and treatment plan.  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  Discussed pathology at tumor board on 03/01/2018.  It was decided to proceed with systemic treatment.  Patient is currently waiting on NGS and PDL 1 expression before finalizing his treatment. It should be back in 1 to 2 weeks. Patient complained of shortness of breath which was at baseline but has not gotten any worse since discharge from the hospital.  He has not had much drainage from his Pleurx catheter.  He denied any pain.  His fatigue remains stable.  Complains of anxiety related to cancer diagnosis and the inability to sleep.  He was given Ambien and Ativan to help.  He was encouraged to call clinic if his breathing became worse.   Oncology History   47 year old male with history of hypertension, previous stroke, PFO, who was recently hospitalized due to shortness of breath and found to have large pleural effusion.  Status post paracentesis which showed malignant cells.  Patient underwent talc pleurodesis, Pleurx catheter placement and pleural biopsy. Patient presents to discuss cancer diagnosis and treatment plan.  Patient was evaluated by Dr. Imelda Pillow during his hospitalization.  I am covering Dr. B to see this patient today.      Primary cancer of right lower lobe of lung ([place])   02/25/2018 Initial Diagnosis    Primary cancer of right lower lobe of lung ([facility])      Interval history-  Patient complains of dyspnea, productive cough, productive cough with sputum described as white and gray, sweats and weight loss.  Symptoms began 1 day ago.  The cough is productive of clear sputum, with shortness of breath during the cough, worsening over time and is aggravated by reclining position Associated symptoms include:shortness of breath and sputum production.  History of stage IV lung cancer.  Recently diagnosed 2 weeks ago.  No current treatment.  Awaiting molecular testing.  ECOG FS:1 - Symptomatic but completely ambulatory  Review of systems- Review of Systems  Constitutional: Positive for diaphoresis, malaise/fatigue and weight loss. Negative for chills and fever.  HENT: Negative for congestion and ear pain.   Eyes: Negative.  Negative for blurred vision and double vision.  Respiratory: Positive for cough, sputum production, shortness of breath and wheezing.   Cardiovascular: Negative.  Negative for chest pain, palpitations and leg swelling.  Gastrointestinal: Negative.  Negative for abdominal pain, constipation, diarrhea, nausea and vomiting.  Genitourinary: Negative for dysuria, frequency and urgency.  Musculoskeletal: Negative for back pain and falls.  Skin: Negative.  Negative for rash.  Neurological: Negative.  Negative for weakness and headaches.  Endo/Heme/Allergies: Negative.  Does not bruise/bleed easily.  Psychiatric/Behavioral: Negative for depression. The patient is nervous/anxious. The patient does not have insomnia.      Current treatment-recent diagnosis.  Awaiting PDL 1 and molecular testing before finalizing treatment.   No Known Allergies   Past Medical History:  Diagnosis Date  .  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 09/15/2016 in setting of PFO.  . CVA (cerebral vascular accident) ([place]) 09/07/2016  . ED (erectile dysfunction)   . Essential  (primary) hypertension 02/01/2016  . History of stroke 10/08/2016  . Hx of insomnia   . Hypertension   . OSA on CPAP   . PFO (patent foramen ovale)    a. s/p closure 09/2016 by Dr. [name].  . Pleural effusion on right 02/21/2018  . Primary cancer of right lower lobe of lung ([place]) 02/25/2018  . PVC's (premature ventricular contractions) 10/08/2016  . 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 TIA (transient ischemic attack) 09/07/2016     Past Surgical History:  Procedure Laterality Date  . APPENDECTOMY  1980  . EYE SURGERY    . PATENT FORAMEN OVALE CLOSURE  10/07/2016  . PATENT FORAMEN OVALE CLOSURE N/A 10/07/2016   Procedure: Patent [place](PFO) Closure;  Surgeon: [name], MD;  Location: [facility];  Service: Cardiovascular;  Laterality: N/A;  . PORTACATH PLACEMENT N/A 02/23/2018   Procedure: INSERTION PORT-A-CATH;  Surgeon: Nestor Lewandowsky, MD;  Location: [facility];  Service: Thoracic;  Laterality: N/A;  . STRABISMUS SURGERY Left 1984  . TEE WITHOUT CARDIOVERSION N/A 09/08/2016   Procedure: TRANSESOPHAGEAL ECHOCARDIOGRAM (TEE);  Surgeon: [name], MD;  Location: [facility];  Service: Cardiovascular;  Laterality: N/A;  . VIDEO ASSISTED THORACOSCOPY (VATS) W/TALC PLEUADESIS Right 02/23/2018   Procedure: VIDEO ASSISTED THORACOSCOPY (VATS) W/TALC PLEUADESIS;  Surgeon: Nestor Lewandowsky, MD;  Location: [facility];  Service: Thoracic;  Laterality: Right;  . VITRECTOMY Right 2013    Social History   Socioeconomic History  . Marital status: Married    Spouse name: [name]   . Number of children: Not on file  . Years of education: Not on file  . Highest education level: Not on file  Occupational History  . Occupation: Software engineer  Social Needs  . Financial resource strain: Not on file  . Food insecurity:    Worry: Not on file    Inability: Not on file  . Transportation needs:    Medical: Not on file    Non-medical: Not on file  Tobacco Use  . Smoking  status: Former Smoker    Packs/day: 0.50    Years: 10.00    Pack years: 5.00    Last attempt to quit: 2011    Years since quitting: 8.4  . Smokeless tobacco: Former Systems developer    Types: Snuff  Substance and Sexual Activity  . Alcohol use: Yes    Alcohol/week: 1.8 oz    Types: 3 Cans of beer per week  . Drug use: No  . Sexual activity: Yes  Lifestyle  . Physical activity:    Days per week: Not on file    Minutes per session: Not on file  . Stress: Not on file  Relationships  . Social connections:    Talks on phone: Not on file    Gets together: Not on file    Attends religious service: Not on file    Active member of club or organization: Not on file    Attends meetings of clubs or organizations: Not on file    Relationship status: Not on file  . Intimate partner violence:    Fear of current or ex partner: Not on file    Emotionally abused: Not on file    Physically abused: Not on file    Forced sexual activity: Not on file  Other Topics Concern  . Not on file  Social History Narrative  . Not on file    Family History  Problem Relation Age of Onset  . Other Paternal Uncle        Abdominal tumor      Current Outpatient Medications:  .  aspirin EC 81 MG EC tablet, Take 1 tablet (81 mg total) by mouth daily., Disp: 120 tablet, Rfl: 0 .  atorvastatin (LIPITOR) 40 MG tablet, Take 1 tablet (40 mg total) by mouth daily at 6 PM. (Patient taking differently: Take 20 mg by mouth daily at 6 PM. ), Disp: 30 tablet, Rfl: 0 .  b complex vitamins capsule, Take 1 capsule by mouth daily. , Disp: , Rfl:  .  Diclofenac Sodium 2 % SOLN, Apply 1 pump twice daily as needed. (Patient taking differently: Apply 1 application topically 2 (two) times daily as needed (pain). Apply 1 pump twice daily as needed.), Disp: 112 g, Rfl: 3 .  loratadine (CLARITIN) 10 MG tablet, Take 10 mg by mouth daily. , Disp: , Rfl:  .  LORazepam (ATIVAN) 0.5 MG tablet, Take 1 tablet (0.5 mg total) by mouth every 8  (eight) hours as needed for anxiety., Disp: 30 tablet, Rfl: 0 .  metoprolol tartrate (LOPRESSOR) 25 MG tablet, Take 1 tablet (25 mg total) by mouth 2 (two) times daily., Disp: 60 tablet, Rfl: 2 .  Multiple Vitamin (MULTIVITAMIN WITH MINERALS) TABS tablet, Take 1 tablet by mouth daily., Disp: , Rfl:  .  oxyCODONE-acetaminophen (PERCOCET/ROXICET) 5-325 MG tablet, Take 1-2 tablets by mouth every 6 (six) hours as needed for moderate pain., Disp: 30 tablet, Rfl: 0 .  senna-docusate (SENOKOT-S) 8.6-50 MG tablet, Take 1 tablet by mouth daily., Disp: 30 tablet, Rfl: 0 .  tadalafil (CIALIS) 5 MG tablet, Take 5 mg by mouth daily as needed for erectile dysfunction., Disp: , Rfl:  .  triamcinolone (NASACORT) 55 MCG/ACT AERO nasal inhaler, Place 2 sprays into the nose daily as needed (congestion). , Disp: , Rfl:  .  Vitamin D, Ergocalciferol, (DRISDOL) 50000 units CAPS capsule, Take 1 capsule (50,000 Units total) by mouth every 7 (seven) days. (Patient taking differently: Take 50,000 Units by mouth every 7 (seven) days. Mondays), Disp: 12 capsule, Rfl: 0 .  zolpidem (AMBIEN) 10 MG tablet, Take 10 mg by mouth at bedtime as needed for sleep. , Disp: , Rfl:  .  levofloxacin (LEVAQUIN) 500 MG tablet, Take 1 tablet (500 mg total) by mouth daily., Disp: 7 tablet, Rfl: 0 .  predniSONE (STERAPRED UNI-PAK 21 TAB) 10 MG (21) TBPK tablet, Take as prescribed., Disp: 21 tablet, Rfl: 0  Physical exam:  Vitals:   03/07/18 0844 03/07/18 0853  BP: 128/70   Pulse: 86   Resp: 16   Temp: (!) 97.2 F (36.2 C)   TempSrc: Tympanic   SpO2:  95%  Weight: 167 lb (75.8 kg)    Physical Exam  Constitutional: He is oriented to person, place, and time. Vital signs are normal. He appears well-developed.  Afebrile  HENT:  Head: Normocephalic and atraumatic.  Eyes: Pupils are equal, round, and reactive to light.  Neck: Normal range of motion.  Cardiovascular: Normal rate, regular rhythm and normal heart sounds.  No murmur  heard. Pulmonary/Chest: Effort normal. He has wheezes in the right upper field, the right middle field and the left upper field. He has rhonchi in the right lower field.  Abdominal: Soft. Normal appearance and bowel sounds are normal. He exhibits no  distension. There is no tenderness.  Musculoskeletal: Normal range of motion. He exhibits no edema.  Neurological: He is alert and oriented to person, place, and time.  Skin: Skin is warm. No rash noted. He is diaphoretic.  Psychiatric: Judgment normal.     CMP Latest Ref Rng & Units 02/26/2018  Glucose 65 - 99 mg/dL 96  BUN 6 - 20 mg/dL 17  Creatinine 0.61 - 1.24 mg/dL 0.96  Sodium 135 - 145 mmol/L 133(L)  Potassium 3.5 - 5.1 mmol/L 4.0  Chloride 101 - 111 mmol/L 101  CO2 22 - 32 mmol/L 26  Calcium 8.9 - 10.3 mg/dL 8.0(L)  Total Protein 6.5 - 8.1 g/dL -  Total Bilirubin 0.3 - 1.2 mg/dL -  Alkaline Phos 38 - 126 U/L -  AST 15 - 41 U/L -  ALT 17 - 63 U/L -   CBC Latest Ref Rng & Units 02/23/2018  WBC 3.8 - 10.6 K/uL 8.4  Hemoglobin 13.0 - 18.0 g/dL 13.7  Hematocrit 40.0 - 52.0 % 38.4(L)  Platelets 150 - 440 K/uL 291    No images are attached to the encounter.  Dg Chest 2 View  Result Date: 03/07/2018 CLINICAL DATA:  Lung carcinoma.  Cough. EXAM: CHEST - 2 VIEW COMPARISON:  March 05, 2018 FINDINGS: Right pleural drain is again noted with the tip at the right apex level. Port-A-Cath tip is in the superior vena cava. No pneumothorax. There are areas of apparent loculated effusion as well as consolidation throughout the right lung. The left lung is clear. Heart size and pulmonary vascularity are normal. IMPRESSION: No appreciable change compared to most recent chest radiograph. Areas of loculated effusion as well as consolidation and probable underlying mases on the right with opacification of much of the right lung, stable. Left lung clear. Stable cardiac silhouette. Electronically Signed   By: Lowella Grip III M.D.   On: 03/07/2018 10:10    Dg Chest 2 View  Result Date: 03/05/2018 CLINICAL DATA:  47 year old male with shortness of breath for 2 weeks. Right lung cancer diagnosed in the past several weeks. EXAM: CHEST - 2 VIEW COMPARISON:  PET-CT 03/01/2018 and earlier. FINDINGS: Right chest IJ approach power port appear stable, not currently accessed. Superimposed right pleural drain. Regressed large right pleural effusion and patchy improved right lung ventilation since the radiographs on 02/21/2018. Residual loculated pleural fluid and/or pleural thickening. Associated thickening of the right paratracheal stripe and hilum. Left mediastinal contours remain normal. A cardiac interatrial occluded device is re-demonstrated. Small left pleural effusion is stable since 03/01/2018, otherwise the left lung is clear. Stable visualized osseous structures. Negative visible bowel gas pattern. IMPRESSION: 1. Right pleural drain in place with regressed right pleural effusion and patchy improved right lung ventilation since 02/21/2018. Residual loculated pleural fluid and/or bulky pleural thickening. 2. Stable small layering left pleural effusion. 3. No new cardiopulmonary abnormality identified. Electronically Signed   By: Genevie Ann M.D.   On: 03/05/2018 15:27   Dg Chest 2 View  Result Date: 02/21/2018 CLINICAL DATA:  Shortness of breath and chest pain beginning 3 days ago. EXAM: CHEST - 2 VIEW COMPARISON:  07/21/2006 FINDINGS: Left hemithorax is clear. ASD closure device overlies the heart. There is a large right effusion with near complete collapse of the right lung. Partial aeration of the right upper lobe. Mediastinum bows towards the left. No significant bone finding. IMPRESSION: Large right effusion with near complete collapse of the right lung. Partial aeration of the right upper lobe. Mediastinal  shift towards the left. Left chest otherwise clear. ASD closure device. Electronically Signed   By: Nelson Chimes M.D.   On: 02/21/2018 16:20   Ct Chest W  Contrast  Result Date: 02/21/2018 CLINICAL DATA:  Patient with shortness of breath. History of bronchitis. Large right pleural effusion. EXAM: CT CHEST WITH CONTRAST TECHNIQUE: Multidetector CT imaging of the chest was performed during intravenous contrast administration. CONTRAST:  72mL ISOVUE-370 IOPAMIDOL (ISOVUE-370) INJECTION 76% COMPARISON:  Chest radiograph 02/21/2018 FINDINGS: Cardiovascular: Normal heart size. Trace fluid superior pericardial recess. Normal caliber aorta and main pulmonary artery. Mediastinum/Nodes: Leftward mediastinal shift secondary to large right pleural effusion. Enlarged mediastinal lymph nodes are demonstrated including a 1.3 cm prevascular lymph node (image 55; series 2). There is a 0.8 cm right paratracheal lymph node (image 52; series 2). Esophagus is poorly visualized. Lungs/Pleura: Mild leftward shift of the tracheal air column. There is a 6 mm nodule within the left lower lobe (image 121; series 3). There is a 6 mm left lower lobe nodule (image 79; series 3). There is a 5 mm nodule in the lingula (image 55; series 3). There is a 3 mm nodule within the left upper lobe (image 41; series 3). There is a large right pleural effusion with near total collapse of the right lung. There is minimal aeration of the right upper lobe. Multiple enhancing predominantly pleural-based nodules are demonstrated throughout the right hemithorax. There is a more dominant pleural-based nodule demonstrated within the anterior right upper hemithorax which measures up to 7 cm. Upper Abdomen: Unremarkable. Musculoskeletal: Thoracic spine degenerative changes. No aggressive or acute appearing osseous lesions. IMPRESSION: 1. Multiple enhancing pleural-based nodules are demonstrated throughout the right hemithorax with large right pleural effusion most concerning for pleural-based malignancy/metastatic disease. There is a more dominant pleural-based mass within the anterior right hemithorax. 2. Multiple  enlarged mediastinal lymph nodes concerning for nodal metastatic disease. 3. Multiple nodules within the left lung concerning for the possibility of pulmonary metastatic disease. 4. These results were called by telephone at the time of interpretation on 02/21/2018 at 5:58 pm. Electronically Signed   By: Lovey Newcomer M.D.   On: 02/21/2018 18:00   Mr Jeri Cos FG Contrast  Result Date: 02/24/2018 CLINICAL DATA:  New diagnosis lung carcinoma. EXAM: MRI HEAD WITHOUT AND WITH CONTRAST TECHNIQUE: Multiplanar, multiecho pulse sequences of the brain and surrounding structures were obtained without and with intravenous contrast. CONTRAST:  24mL MULTIHANCE GADOBENATE DIMEGLUMINE 529 MG/ML IV SOLN COMPARISON:  None. FINDINGS: Brain: Diffusion imaging does not show any acute or subacute infarction. Brainstem is normal. 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 There is old infarction in the deep insula and right posterior frontal brain. Mild chronic small-vessel changes seen elsewhere within the hemispheric white matter. There is no evidence of metastatic disease. No hemorrhage, hydrocephalus or extra-axial collection. Vascular: Major vessels at the base of the brain show flow. Skull and upper cervical spine: Negative Sinuses/Orbits: Clear/normal Other: None IMPRESSION: No evidence of metastatic disease.  Old ischemic changes as above. Electronically Signed   By: Nelson Chimes M.D.   On: 02/24/2018 11:58   Nm Pet Image Initial (pi) Skull Base To Thigh  Result Date: 03/01/2018 CLINICAL DATA:  Initial treatment strategy for right lung carcinoma. EXAM: NUCLEAR MEDICINE PET SKULL BASE TO THIGH TECHNIQUE: 9.6 mCi F-18 FDG was injected intravenously. Full-ring PET imaging was performed from the skull base to thigh after the radiotracer. CT data was obtained and used for attenuation correction and anatomic localization.  Fasting blood glucose: 86 mg/dl COMPARISON:  Chest CT on 02/21/2018 FINDINGS: (Mediastinal blood pool  activity: SUV max = 1.7) NECK:  No hypermetabolic lymph nodes or masses. Incidental CT findings:  None. CHEST: No hypermetabolic lymphadenopathy. Right pleural effusion is decreased in size since previous study and right pleural drain is seen in place. Large masslike opacity in the anterior inferior right upper lobe is seen which abuts the anterior chest wall and measures approximately 12.0 x 6.5 cm on image 87/3. This has SUV max of 12.0, and is suspicious for primary bronchogenic carcinoma. Bulky hypermetabolic soft tissue density is seen throughout the right pleural space, consistent with pleural metastatic disease. Small left pleural effusion is seen, without FDG uptake. No suspicious left lung nodules or masses identified. Incidental CT findings:  None. ABDOMEN/PELVIS: No abnormal hypermetabolic activity within the liver, pancreas, adrenal glands, or spleen. No hypermetabolic lymph nodes in the abdomen or pelvis. Incidental CT findings:  Mild ascites seen, without FDG uptake. SKELETON: No focal hypermetabolic bone lesions to suggest skeletal metastasis. Incidental CT findings:  None. IMPRESSION: Large hypermetabolic masslike opacity in the anterior right upper lobe, suspicious for primary bronchogenic carcinoma. Bulky hypermetabolic soft tissue density throughout the right pleural space, consistent with metastatic disease. No evidence of metastatic disease within the neck, abdomen, or pelvis. Electronically Signed   By: Earle Gell M.D.   On: 03/01/2018 11:28   Dg Chest Port 1 View  Result Date: 02/26/2018 CLINICAL DATA:  Chest tube in place.  Lung cancer. EXAM: PORTABLE CHEST 1 VIEW COMPARISON:  One-view chest x-ray 02/23/2018 FINDINGS: Heart size is normal. A right IJ Port-A-Cath is stable. Two right-sided chest tubes are stable in position. Interstitial and airspace disease is increasing on the right. A right pleural effusion is suspected. The left lung is clear. The visualized soft tissues and bony  thorax are unremarkable. No residual pneumothorax is present. IMPRESSION: 1. Increasing right pleural effusion and airspace disease. 2. PleurX catheter and right-sided chest tube stable in position. 3. Right IJ catheter stable. Electronically Signed   By: San Morelle M.D.   On: 02/26/2018 07:17   Dg Chest Port 1 View  Result Date: 02/23/2018 CLINICAL DATA:  Postoperative chest x-ray.  Tube placements. EXAM: PORTABLE CHEST 1 VIEW COMPARISON:  02/22/2018. FINDINGS: PowerPort catheter with tip over cavoatrial junction. Two right chest tubes noted with tip over the right upper chest. Significant reduction in right-sided pleural effusion with mild residual. No pneumothorax. Atelectatic changes are noted in the right upper and lower lungs. Follow-up exams to demonstrate resolution suggested. Heart size normal. No acute bony abnormality. IMPRESSION: 1.  PowerPort catheter noted with tip at the cavoatrial junction. 2. Two right chest tubes noted. Significant reduction in right-sided pleural effusion with mild residual. Atelectatic changes noted the right upper and lower lungs. Follow-up exams demonstrate resolution suggested. Electronically Signed   By: Marcello Moores  Register   On: 02/23/2018 16:10   Dg Chest Port 1 View  Result Date: 02/22/2018 CLINICAL DATA:  Post right-sided thoracentesis EXAM: PORTABLE CHEST 1 VIEW COMPARISON:  02/21/2018; chest CT-02/21/2018 FINDINGS: Interval reduction in persistent moderate-sized right-sided effusion post thoracentesis. No pneumothorax. Slight reduction in previously noted right to left mediastinal shift. Otherwise, unchanged cardiac silhouette and mediastinal contours with persistent obscuration of the right heart border secondary to right basilar consolidative opacities. Post PFO ligation. No new focal airspace opacities. The left hemithorax remains well aerated. No acute osseus abnormalities. IMPRESSION: Interval reduction in persistent moderate-sized right-sided  effusion post thoracentesis. No pneumothorax.  Electronically Signed   By: Sandi Mariscal M.D.   On: 02/22/2018 10:41   Dg C-arm 1-60 Min-no Report  Result Date: 02/23/2018 Fluoroscopy was utilized by the requesting physician.  No radiographic interpretation.   US Thoracentesis Asp Pleural Space W/img Guide  Result Date: 02/22/2018 INDICATION: No known primary, now with multiple pleural based masses and symptomatic right-sided pleural effusion. Please perform ultrasound-guided thoracentesis for diagnostic and therapeutic purposes. EXAM: US THORACENTESIS ASP PLEURAL SPACE W/IMG GUIDE COMPARISON:  Chest radiograph - 02/21/2018; chest CT - 02/21/2018 MEDICATIONS: None. COMPLICATIONS: None immediate. TECHNIQUE: Informed written consent was obtained from the patient after a discussion of the risks, benefits and alternatives to treatment. A timeout was performed prior to the initiation of the procedure. Initial ultrasound scanning demonstrates a large anechoic right-sided pleural effusion. The lower chest was prepped and draped in the usual sterile fashion. 1% lidocaine was used for local anesthesia. An ultrasound image was saved for documentation purposes. An 8 Fr Safe-T-Centesis catheter was introduced. The thoracentesis was performed. The catheter was removed and a dressing was applied. The patient tolerated the procedure well without immediate post procedural complication. The patient was escorted to have an upright chest radiograph. FINDINGS: A total of approximately 2 liters of serous, slightly blood tinged fluid was removed. Requested samples were sent to the laboratory. IMPRESSION: Successful ultrasound-guided right sided thoracentesis yielding 2 liters of pleural fluid. Electronically Signed   By: Sandi Mariscal M.D.   On: 02/22/2018 11:06     Assessment and plan- Patient is a 47 y.o. male who presents to symptom management for worsening cough and congestion x1 day.  1.  Newly diagnosed stage IV lung  cancer: Patient was recently hospitalized for shortness of breath and found to have a large  right pleural effusion with near complete collapse of the right lung.  Thoracentesis revealing malignant cells.  Cardiothoracic surgery was consulted and patient underwent talc pleurodesis with Pleurx catheter placement and lung biopsy.  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  PET scan from 03/01/2018 revealed large hypermetabolic mass in right upper lobe and bulky hypermetabolic soft tissue density within the right pleural space.  There is no evidence of metastatic disease within the neck abdomen or pelvis.  Awaiting results from PDL 1 and NGS to finalize treatment planning.  He is scheduled to return to clinic on 03/23/2017 to discuss treatment plans with Dr. Rogue Bussing.  2.  Productive cough/congestion: Patient recently had chest x-ray on 03/05/2018 for evaluation of placement of Pleurx catheter by Dr. Genevive Bi.  Patient states symptoms developed yesterday.  He is afebrile during our visit today but is diaphoretic.  During assessment, patient's lungs had scattered wheezes and rhonchi to right lower and middle lobes.  Will get stat chest x-ray.   Plan:  No labs were drawn today. Stat chest x-ray: No appreciable change compared to most recent chest x-ray.  Did show areas of loculated effusion as well as consolidation and probable underlying masses on the right side.  The left lung remains clear.  Spoke with Dr. Grayland Ormond and given patient is extremely symptomatic we will treat with 7-day course of Levaquin 500 mg daily and a Medrol Dosepak to cover him for community acquired penumonia.  Patient tells me he has Xopenex at home if needed.  We will give DuoNeb while in clinic.  Reassessed after DuoNeb.  Lungs continued to be wheezy but improved.  Patient's vital signs remained stable.   Visit Diagnosis 1. Primary cancer of right lower lobe of  lung ([place])   2. Pneumonia of right lung due to  infectious organism, unspecified part of lung     Patient expressed understanding and was in agreement with this plan. He also understands that He can call clinic at any time with any questions, concerns, or complaints.   Greater than 50% was spent in counseling and coordination of care with this patient including but not limited to discussion of the relevant topics above (See A&P) including, but not limited to diagnosis and management of acute and chronic medical conditions.    Faythe Casa, AGNP-C [facility]- [phone] Pager- [phone] 03/12/2018 8:27 AM

## 2018-03-08 DIAGNOSIS — E7800 Pure hypercholesterolemia, unspecified: Secondary | ICD-10-CM | POA: Insufficient documentation

## 2018-03-12 ENCOUNTER — Inpatient Hospital Stay (HOSPITAL_BASED_OUTPATIENT_CLINIC_OR_DEPARTMENT_OTHER): Payer: No Typology Code available for payment source | Admitting: Oncology

## 2018-03-12 ENCOUNTER — Other Ambulatory Visit: Payer: Self-pay | Admitting: Oncology

## 2018-03-12 ENCOUNTER — Ambulatory Visit
Admission: RE | Admit: 2018-03-12 | Discharge: 2018-03-12 | Disposition: A | Discharge status: Home / Self Care | Payer: No Typology Code available for payment source | Source: Ambulatory Visit | Attending: Oncology | Admitting: Oncology

## 2018-03-12 ENCOUNTER — Telehealth: Payer: Self-pay | Admitting: *Deleted

## 2018-03-12 VITALS — BP 133/79 | HR 86 | Temp 97.9°F | Resp 24 | Wt 175.0 lb

## 2018-03-12 DIAGNOSIS — G4700 Insomnia, unspecified: Secondary | ICD-10-CM

## 2018-03-12 DIAGNOSIS — Q211 Atrial septal defect: Secondary | ICD-10-CM | POA: Diagnosis not present

## 2018-03-12 DIAGNOSIS — Z7982 Long term (current) use of aspirin: Secondary | ICD-10-CM

## 2018-03-12 DIAGNOSIS — Z8673 Personal history of transient ischemic attack (TIA), and cerebral infarction without residual deficits: Secondary | ICD-10-CM

## 2018-03-12 DIAGNOSIS — C800 Disseminated malignant neoplasm, unspecified: Secondary | ICD-10-CM | POA: Insufficient documentation

## 2018-03-12 DIAGNOSIS — J910 Malignant pleural effusion: Secondary | ICD-10-CM

## 2018-03-12 DIAGNOSIS — J189 Pneumonia, unspecified organism: Secondary | ICD-10-CM

## 2018-03-12 DIAGNOSIS — J90 Pleural effusion, not elsewhere classified: Secondary | ICD-10-CM

## 2018-03-12 DIAGNOSIS — F419 Anxiety disorder, unspecified: Secondary | ICD-10-CM

## 2018-03-12 DIAGNOSIS — G4733 Obstructive sleep apnea (adult) (pediatric): Secondary | ICD-10-CM | POA: Diagnosis not present

## 2018-03-12 DIAGNOSIS — Z9989 Dependence on other enabling machines and devices: Secondary | ICD-10-CM

## 2018-03-12 DIAGNOSIS — R0602 Shortness of breath: Secondary | ICD-10-CM | POA: Diagnosis present

## 2018-03-12 DIAGNOSIS — J9811 Atelectasis: Secondary | ICD-10-CM | POA: Insufficient documentation

## 2018-03-12 DIAGNOSIS — Z87891 Personal history of nicotine dependence: Secondary | ICD-10-CM

## 2018-03-12 DIAGNOSIS — Z79899 Other long term (current) drug therapy: Secondary | ICD-10-CM

## 2018-03-12 DIAGNOSIS — C3431 Malignant neoplasm of lower lobe, right bronchus or lung: Secondary | ICD-10-CM

## 2018-03-12 DIAGNOSIS — I10 Essential (primary) hypertension: Secondary | ICD-10-CM

## 2018-03-12 DIAGNOSIS — R61 Generalized hyperhidrosis: Secondary | ICD-10-CM | POA: Diagnosis not present

## 2018-03-12 LAB — CBC WITH DIFFERENTIAL/PLATELET
BASOS PCT: 0 %
Basophils Absolute: 0 10*3/uL (ref 0–0.1)
EOS ABS: 0.1 10*3/uL (ref 0–0.7)
EOS PCT: 1 %
HCT: 35.7 % — ABNORMAL LOW (ref 40.0–52.0)
Hemoglobin: 12.2 g/dL — ABNORMAL LOW (ref 13.0–18.0)
LYMPHS ABS: 1.5 10*3/uL (ref 1.0–3.6)
Lymphocytes Relative: 15 %
MCH: 31.8 pg (ref 26.0–34.0)
MCHC: 34.2 g/dL (ref 32.0–36.0)
MCV: 92.9 fL (ref 80.0–100.0)
Monocytes Absolute: 1 10*3/uL (ref 0.2–1.0)
Monocytes Relative: 10 %
Neutro Abs: 7.7 10*3/uL — ABNORMAL HIGH (ref 1.4–6.5)
Neutrophils Relative %: 74 %
Platelets: 390 10*3/uL (ref 150–440)
RBC: 3.84 MIL/uL — AB (ref 4.40–5.90)
RDW: 12.9 % (ref 11.5–14.5)
WBC: 10.3 10*3/uL (ref 3.8–10.6)

## 2018-03-12 LAB — COMPREHENSIVE METABOLIC PANEL
ALT: 28 U/L (ref 17–63)
ANION GAP: 8 (ref 5–15)
AST: 26 U/L (ref 15–41)
Albumin: 2.8 g/dL — ABNORMAL LOW (ref 3.5–5.0)
Alkaline Phosphatase: 58 U/L (ref 38–126)
BUN: 18 mg/dL (ref 6–20)
CO2: 24 mmol/L (ref 22–32)
Calcium: 9.4 mg/dL (ref 8.9–10.3)
Chloride: 103 mmol/L (ref 101–111)
Creatinine, Ser: 1.1 mg/dL (ref 0.61–1.24)
GFR calc non Af Amer: >60 mL/min (ref >60)
Glucose, Bld: 114 mg/dL — ABNORMAL HIGH (ref 65–99)
Potassium: 4.1 mmol/L (ref 3.5–5.1)
SODIUM: 135 mmol/L (ref 135–145)
Total Bilirubin: 0.7 mg/dL (ref 0.3–1.2)
Total Protein: 5.6 g/dL — ABNORMAL LOW (ref 6.5–8.1)

## 2018-03-12 LAB — MAGNESIUM: Magnesium: 1.8 mg/dL (ref 1.7–2.4)

## 2018-03-12 LAB — BRAIN NATRIURETIC PEPTIDE: B NATRIURETIC PEPTIDE 5: 76 pg/mL (ref 0.0–100.0)

## 2018-03-12 LAB — LACTIC ACID, PLASMA: LACTIC ACID, VENOUS: 2.1 mmol/L — AB (ref 0.5–1.9)

## 2018-03-12 MED ORDER — DEXAMETHASONE 4 MG PO TABS: 8.0000 mg | ORAL_TABLET | Freq: Every day | ORAL | 1 refills | Status: DC

## 2018-03-12 MED ORDER — MORPHINE SULFATE (CONCENTRATE) 10 MG /0.5 ML PO SOLN: 20.0000 mg | ORAL | 0 refills | Status: DC | PRN

## 2018-03-12 MED ORDER — IOPAMIDOL (ISOVUE-370) INJECTION 76%
75.0000 mL | Freq: Once | INTRAVENOUS | Status: AC | PRN
  Administered 2018-03-12: 75 mL via INTRAVENOUS

## 2018-03-12 MED ORDER — ONDANSETRON HCL 8 MG PO TABS
8.0000 mg | ORAL_TABLET | Freq: Two times a day (BID) | ORAL | 1 refills | Status: DC | PRN
Start: 2018-03-12 — End: 2018-04-24

## 2018-03-12 NOTE — Progress Notes (Signed)
Symptom Management Consult note [facility]  [phone] [phone]  Patient Care Team: Dion Body, MD as PCP - General (Family Medicine) [name], MD as PCP - Cardiology (Cardiology) Telford Nab, RN as Registered Nurse   Name of the patient: Ruben Eaton  [id number]  August 11, 1971   Date of visit: 03/12/18  Diagnosis-recently diagnosed stage IV lung cancer  Chief complaint/ Reason for visit-increasing shortness of breath and cough  Heme/Onc history: Patient was last seen in [facility] on 03/07/18 for shortness of breath and cough X 1 day.  Had chest x-ray revealing no appreciable change compared to most recent chest x-ray.  It did show areas of loculated effusion as well as consolidation.  The left lung remains clear.  He was started on a 7-day course of Levaquin 500 mg daily and a Medrol Dosepak to cover him for community-acquired pneumonia.  He was given a DuoNeb in clinic and told to return if symptoms did not improve.  Prior to this, he was seen by Dr. Tasia Catchings to discuss cancer diagnosis and treatment plan they were waiting on molecular studies to finalize treatment plan.  He is presented at tumor board and consensus was for patient to begin systemic treatment once studies returned.  He continued to self drain his Pleurx catheter with very low output.  Oncology History   47 year old male with history of hypertension, previous stroke, PFO, who was recently hospitalized due to shortness of breath and found to have large pleural effusion.  Status post paracentesis which showed malignant cells.  Patient underwent talc pleurodesis, Pleurx catheter placement and pleural biopsy. Patient presents to discuss cancer diagnosis and treatment plan.  Patient was evaluated by Dr. Imelda Pillow during his hospitalization.  I am covering Dr. B to see this patient today.      Primary cancer of right lower lobe of lung ([place])   02/25/2018 Initial Diagnosis     Primary cancer of right lower lobe of lung ([place])      03/12/2018 -  Chemotherapy    The patient had PALONOSETRON HCL INJECTION 0.25 MG/5ML, 0.25 mg, Intravenous,  Once, 0 of 4 cycles pegfilgrastim-cbqv (UDENYCA) injection 6 mg, 6 mg, Subcutaneous, Once, 0 of 4 cycles CARBOplatin (PARAPLATIN) in sodium chloride 0.9 % 100 mL chemo infusion, , Intravenous,  Once, 0 of 4 cycles PACLitaxel (TAXOL) 384 mg in sodium chloride 0.9 % 500 mL chemo infusion (> 80mg /m2), 200 mg/m2, Intravenous,  Once, 0 of 4 cycles  for chemotherapy treatment.        Interval history-  Patient complains of shortness of breath at rest.  Symptoms include constant cough, difficulty breathing, dyspnea on exertion, dyspnea when laying down, edema BLE, productive cough, shortness of breath, sputum production, tightness in chest and wheezing. Symptoms began 5 days ago, rapidly worsening since that time.  Patient denies chest pain, constipation, diarrhea. Associated symptoms include dyspnea, productive cough and weight gain 8 lbs. Patient has not had recent travel.  Weight has been stable.  Appetite has been decreased. Symptoms are exacerbated by any exercise. Symptoms are alleviated by rest.   ECOG FS:1 - Symptomatic but completely ambulatory  Review of systems- Review of Systems  Constitutional: Negative.  Negative for chills, fever, malaise/fatigue and weight loss.  HENT: Negative for congestion and ear pain.   Eyes: Negative.  Negative for blurred vision and double vision.  Respiratory: Positive for cough, sputum production, shortness of breath and wheezing.   Cardiovascular: Negative.  Negative for chest pain,  palpitations and leg swelling.  Gastrointestinal: Negative.  Negative for abdominal pain, constipation, diarrhea, nausea and vomiting.  Genitourinary: Negative for dysuria, frequency and urgency.  Musculoskeletal: Negative for back pain and falls.  Skin: Negative.  Negative for rash.  Neurological: Negative.   Negative for weakness and headaches.  Endo/Heme/Allergies: Negative.  Does not bruise/bleed easily.  Psychiatric/Behavioral: Negative for depression. The patient is nervous/anxious and has insomnia.      Current treatment-treatment plan is not finalized  No Known Allergies   Past Medical History:  Diagnosis Date  . 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 09/15/2016 in setting of PFO.  . CVA (cerebral vascular accident) ([place]) 09/07/2016  . ED (erectile dysfunction)   . Essential (primary) hypertension 02/01/2016  . History of stroke 10/08/2016  . Hx of insomnia   . Hypertension   . OSA on CPAP   . PFO (patent foramen ovale)    a. s/p closure 09/2016 by Dr. [name].  . Pleural effusion on right 02/21/2018  . Primary cancer of right lower lobe of lung ([place]) 02/25/2018  . PVC's (premature ventricular contractions) 10/08/2016  . 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 TIA (transient ischemic attack) 09/07/2016     Past Surgical History:  Procedure Laterality Date  . APPENDECTOMY  1980  . EYE SURGERY    . PATENT FORAMEN OVALE CLOSURE  10/07/2016  . PATENT FORAMEN OVALE CLOSURE N/A 10/07/2016   Procedure: Patent Forament [place](PFO) Closure;  Surgeon: [name], MD;  Location: [place] [facility];  Service: Cardiovascular;  Laterality: N/A;  . PORTACATH PLACEMENT N/A 02/23/2018   Procedure: INSERTION PORT-A-CATH;  Surgeon: Nestor Lewandowsky, MD;  Location: [facility];  Service: Thoracic;  Laterality: N/A;  . STRABISMUS SURGERY Left 1984  . TEE WITHOUT CARDIOVERSION N/A 09/08/2016   Procedure: TRANSESOPHAGEAL ECHOCARDIOGRAM (TEE);  Surgeon: [name], MD;  Location: [facility];  Service: Cardiovascular;  Laterality: N/A;  . VIDEO ASSISTED THORACOSCOPY (VATS) W/TALC PLEUADESIS Right 02/23/2018   Procedure: VIDEO ASSISTED THORACOSCOPY (VATS) W/TALC PLEUADESIS;  Surgeon: Nestor Lewandowsky, MD;  Location: [facility];  Service: Thoracic;  Laterality: Right;  . VITRECTOMY Right 2013     Social History   Socioeconomic History  . Marital status: Married    Spouse name: [name]   . Number of children: Not on file  . Years of education: Not on file  . Highest education level: Not on file  Occupational History  . Occupation: Software engineer  Social Needs  . Financial resource strain: Not on file  . Food insecurity:    Worry: Not on file    Inability: Not on file  . Transportation needs:    Medical: Not on file    Non-medical: Not on file  Tobacco Use  . Smoking status: Former Smoker    Packs/day: 0.50    Years: 10.00    Pack years: 5.00    Last attempt to quit: 2011    Years since quitting: 8.4  . Smokeless tobacco: Former Systems developer    Types: Snuff  Substance and Sexual Activity  . Alcohol use: Yes    Alcohol/week: 1.8 oz    Types: 3 Cans of beer per week  . Drug use: No  . Sexual activity: Yes  Lifestyle  . Physical activity:    Days per week: Not on file    Minutes per session: Not on file  . Stress: Not on file  Relationships  . Social connections:    Talks on phone: Not  on file    Gets together: Not on file    Attends religious service: Not on file    Active member of club or organization: Not on file    Attends meetings of clubs or organizations: Not on file    Relationship status: Not on file  . Intimate partner violence:    Fear of current or ex partner: Not on file    Emotionally abused: Not on file    Physically abused: Not on file    Forced sexual activity: Not on file  Other Topics Concern  . Not on file  Social History Narrative  . Not on file    Family History  Problem Relation Age of Onset  . Other Paternal Uncle        Abdominal tumor      Current Outpatient Medications:  .  aspirin EC 81 MG EC tablet, Take 1 tablet (81 mg total) by mouth daily., Disp: 120 tablet, Rfl: 0 .  atorvastatin (LIPITOR) 40 MG tablet, Take 1 tablet (40 mg total) by mouth daily at 6 PM. (Patient taking differently: Take 20 mg by mouth daily at 6 PM. ),  Disp: 30 tablet, Rfl: 0 .  b complex vitamins capsule, Take 1 capsule by mouth daily. , Disp: , Rfl:  .  Diclofenac Sodium 2 % SOLN, Apply 1 pump twice daily as needed. (Patient taking differently: Apply 1 application topically 2 (two) times daily as needed (pain). Apply 1 pump twice daily as needed.), Disp: 112 g, Rfl: 3 .  levofloxacin (LEVAQUIN) 500 MG tablet, Take 1 tablet (500 mg total) by mouth daily., Disp: 7 tablet, Rfl: 0 .  loratadine (CLARITIN) 10 MG tablet, Take 10 mg by mouth daily. , Disp: , Rfl:  .  LORazepam (ATIVAN) 0.5 MG tablet, Take 1 tablet (0.5 mg total) by mouth every 8 (eight) hours as needed for anxiety., Disp: 30 tablet, Rfl: 0 .  metoprolol tartrate (LOPRESSOR) 25 MG tablet, Take 1 tablet (25 mg total) by mouth 2 (two) times daily., Disp: 60 tablet, Rfl: 2 .  Multiple Vitamin (MULTIVITAMIN WITH MINERALS) TABS tablet, Take 1 tablet by mouth daily., Disp: , Rfl:  .  predniSONE (STERAPRED UNI-PAK 21 TAB) 10 MG (21) TBPK tablet, Take as prescribed., Disp: 21 tablet, Rfl: 0 .  senna-docusate (SENOKOT-S) 8.6-50 MG tablet, Take 1 tablet by mouth daily., Disp: 30 tablet, Rfl: 0 .  tadalafil (CIALIS) 5 MG tablet, Take 5 mg by mouth daily as needed for erectile dysfunction., Disp: , Rfl:  .  triamcinolone (NASACORT) 55 MCG/ACT AERO nasal inhaler, Place 2 sprays into the nose daily as needed (congestion). , Disp: , Rfl:  .  Vitamin D, Ergocalciferol, (DRISDOL) 50000 units CAPS capsule, Take 1 capsule (50,000 Units total) by mouth every 7 (seven) days. (Patient taking differently: Take 50,000 Units by mouth every 7 (seven) days. Mondays), Disp: 12 capsule, Rfl: 0 .  zolpidem (AMBIEN) 10 MG tablet, Take 10 mg by mouth at bedtime as needed for sleep. , Disp: , Rfl:  .  dexamethasone (DECADRON) 4 MG tablet, Take 2 tablets (8 mg total) by mouth daily. Start the day after chemotherapy for 2 days., Disp: 30 tablet, Rfl: 1 .  Morphine Sulfate (MORPHINE CONCENTRATE) 10 mg / 0.5 ml concentrated  solution, Take 1 mL (20 mg total) by mouth every 4 (four) hours as needed for shortness of breath., Disp: 118 mL, Rfl: 0 .  ondansetron (ZOFRAN) 8 MG tablet, Take 1 tablet (8 mg total) by  mouth 2 (two) times daily as needed for refractory nausea / vomiting., Disp: 30 tablet, Rfl: 1  Physical exam:  Vitals:   03/12/18 1006  BP: 133/79  Pulse: 86  Resp: (!) 24  Temp: 97.9 F (36.6 C)  SpO2: 93%  Weight: 175 lb (79.4 kg)   Physical Exam  Constitutional: He is oriented to person, place, and time. Vital signs are normal. He appears well-developed and well-nourished.  HENT:  Head: Normocephalic and atraumatic.  Eyes: Pupils are equal, round, and reactive to light.  Neck: Normal range of motion.  Cardiovascular: Normal rate, regular rhythm and normal heart sounds.  No murmur heard. Pulmonary/Chest: Effort normal. He has wheezes in the right lower field and the left lower field.  Abdominal: Soft. Normal appearance and bowel sounds are normal. He exhibits no distension. There is no tenderness.  Musculoskeletal: Normal range of motion.       Right lower leg: He exhibits edema.       Left lower leg: He exhibits edema.  Neurological: He is alert and oriented to person, place, and time.  Skin: Skin is warm and dry. No rash noted.  Psychiatric: Judgment normal. His mood appears anxious.     CMP Latest Ref Rng & Units 03/12/2018  Glucose 65 - 99 mg/dL 114(H)  BUN 6 - 20 mg/dL 18  Creatinine 0.61 - 1.24 mg/dL 1.10  Sodium 135 - 145 mmol/L 135  Potassium 3.5 - 5.1 mmol/L 4.1  Chloride 101 - 111 mmol/L 103  CO2 22 - 32 mmol/L 24  Calcium 8.9 - 10.3 mg/dL 9.4  Total Protein 6.5 - 8.1 g/dL 5.6(L)  Total Bilirubin 0.3 - 1.2 mg/dL 0.7  Alkaline Phos 38 - 126 U/L 58  AST 15 - 41 U/L 26  ALT 17 - 63 U/L 28   CBC Latest Ref Rng & Units 03/12/2018  WBC 3.8 - 10.6 K/uL 10.3  Hemoglobin 13.0 - 18.0 g/dL 12.2(L)  Hematocrit 40.0 - 52.0 % 35.7(L)  Platelets 150 - 440 K/uL 390    No images are  attached to the encounter.  Dg Chest 2 View  Result Date: 03/12/2018 CLINICAL DATA:  RIGHT-side pleural effusion, shortness of breath RIGHT lower lobe lung cancer, former smoker EXAM: CHEST - 2 VIEW COMPARISON:  03/07/2018 FINDINGS: RIGHT jugular Port-A-Cath with tip projecting over SVC. RIGHT thoracostomy tube. Stable heart size. Loculated RIGHT pleural effusion and nodularity throughout the RIGHT hemithorax from known malignant effusion. Mediastinal shift RIGHT to LEFT increased. LEFT lung clear. No LEFT pneumothorax identified. Osseous structures unremarkable. IMPRESSION: Known RIGHT pleural carcinomatosis and malignant effusion with increased RIGHT pleural opacity and RIGHT lung atelectasis since 03/07/2018. Increased RIGHT to LEFT midline shift. Electronically Signed   By: Lavonia Dana M.D.   On: 03/12/2018 11:35   Dg Chest 2 View  Result Date: 03/07/2018 CLINICAL DATA:  Lung carcinoma.  Cough. EXAM: CHEST - 2 VIEW COMPARISON:  March 05, 2018 FINDINGS: Right pleural drain is again noted with the tip at the right apex level. Port-A-Cath tip is in the superior vena cava. No pneumothorax. There are areas of apparent loculated effusion as well as consolidation throughout the right lung. The left lung is clear. Heart size and pulmonary vascularity are normal. IMPRESSION: No appreciable change compared to most recent chest radiograph. Areas of loculated effusion as well as consolidation and probable underlying mases on the right with opacification of much of the right lung, stable. Left lung clear. Stable cardiac silhouette. Electronically Signed   By: Gwyndolyn Saxon  Jasmine December III M.D.   On: 03/07/2018 10:10   Dg Chest 2 View  Result Date: 03/05/2018 CLINICAL DATA:  47 year old male with shortness of breath for 2 weeks. Right lung cancer diagnosed in the past several weeks. EXAM: CHEST - 2 VIEW COMPARISON:  PET-CT 03/01/2018 and earlier. FINDINGS: Right chest IJ approach power port appear stable, not currently  accessed. Superimposed right pleural drain. Regressed large right pleural effusion and patchy improved right lung ventilation since the radiographs on 02/21/2018. Residual loculated pleural fluid and/or pleural thickening. Associated thickening of the right paratracheal stripe and hilum. Left mediastinal contours remain normal. A cardiac interatrial occluded device is re-demonstrated. Small left pleural effusion is stable since 03/01/2018, otherwise the left lung is clear. Stable visualized osseous structures. Negative visible bowel gas pattern. IMPRESSION: 1. Right pleural drain in place with regressed right pleural effusion and patchy improved right lung ventilation since 02/21/2018. Residual loculated pleural fluid and/or bulky pleural thickening. 2. Stable small layering left pleural effusion. 3. No new cardiopulmonary abnormality identified. Electronically Signed   By: Genevie Ann M.D.   On: 03/05/2018 15:27   Dg Chest 2 View  Result Date: 02/21/2018 CLINICAL DATA:  Shortness of breath and chest pain beginning 3 days ago. EXAM: CHEST - 2 VIEW COMPARISON:  07/21/2006 FINDINGS: Left hemithorax is clear. ASD closure device overlies the heart. There is a large right effusion with near complete collapse of the right lung. Partial aeration of the right upper lobe. Mediastinum bows towards the left. No significant bone finding. IMPRESSION: Large right effusion with near complete collapse of the right lung. Partial aeration of the right upper lobe. Mediastinal shift towards the left. Left chest otherwise clear. ASD closure device. Electronically Signed   By: Nelson Chimes M.D.   On: 02/21/2018 16:20   Ct Chest W Contrast  Result Date: 02/21/2018 CLINICAL DATA:  Patient with shortness of breath. History of bronchitis. Large right pleural effusion. EXAM: CT CHEST WITH CONTRAST TECHNIQUE: Multidetector CT imaging of the chest was performed during intravenous contrast administration. CONTRAST:  49mL ISOVUE-370 IOPAMIDOL  (ISOVUE-370) INJECTION 76% COMPARISON:  Chest radiograph 02/21/2018 FINDINGS: Cardiovascular: Normal heart size. Trace fluid superior pericardial recess. Normal caliber aorta and main pulmonary artery. Mediastinum/Nodes: Leftward mediastinal shift secondary to large right pleural effusion. Enlarged mediastinal lymph nodes are demonstrated including a 1.3 cm prevascular lymph node (image 55; series 2). There is a 0.8 cm right paratracheal lymph node (image 52; series 2). Esophagus is poorly visualized. Lungs/Pleura: Mild leftward shift of the tracheal air column. There is a 6 mm nodule within the left lower lobe (image 121; series 3). There is a 6 mm left lower lobe nodule (image 79; series 3). There is a 5 mm nodule in the lingula (image 55; series 3). There is a 3 mm nodule within the left upper lobe (image 41; series 3). There is a large right pleural effusion with near total collapse of the right lung. There is minimal aeration of the right upper lobe. Multiple enhancing predominantly pleural-based nodules are demonstrated throughout the right hemithorax. There is a more dominant pleural-based nodule demonstrated within the anterior right upper hemithorax which measures up to 7 cm. Upper Abdomen: Unremarkable. Musculoskeletal: Thoracic spine degenerative changes. No aggressive or acute appearing osseous lesions. IMPRESSION: 1. Multiple enhancing pleural-based nodules are demonstrated throughout the right hemithorax with large right pleural effusion most concerning for pleural-based malignancy/metastatic disease. There is a more dominant pleural-based mass within the anterior right hemithorax. 2. Multiple enlarged mediastinal lymph nodes  concerning for nodal metastatic disease. 3. Multiple nodules within the left lung concerning for the possibility of pulmonary metastatic disease. 4. These results were called by telephone at the time of interpretation on 02/21/2018 at 5:58 pm. Electronically Signed   By: Lovey Newcomer M.D.   On: 02/21/2018 18:00   Ct Angio Chest Pe W Or Wo Contrast  Result Date: 03/12/2018 CLINICAL DATA:  Increasing shortness of breath. EXAM: CT ANGIOGRAPHY CHEST WITH CONTRAST TECHNIQUE: Multidetector CT imaging of the chest was performed using the standard protocol during bolus administration of intravenous contrast. Multiplanar CT image reconstructions and MIPs were obtained to evaluate the vascular anatomy. CONTRAST:  62mL ISOVUE-370 IOPAMIDOL (ISOVUE-370) INJECTION 76% COMPARISON:  PET-CT dated 03/01/2018 and chest CT dated 02/21/2018 FINDINGS: Cardiovascular: There is due shift of the heart and other mediastinal structures to the left by the rapidly expanding extensive tumor in the right hemithorax. There are no pulmonary emboli. There is slight compression of the right main pulmonary artery back extensive mediastinal and hilar adenopathy. Port-A-Cath tip at the cavoatrial junction. Mediastinum/Nodes: Rapid increased extensive right hilar and mediastinal adenopathy. Rapid expansion of subcarinal adenopathy and adenopathy adjacent to the distal esophagus. Lungs/Pleura: There has been quite rapid growth of the extensive tumor in the right hemithorax as well as expansion of a cystic area in the right midzone with further compressive atelectasis of the remaining portion of the right lung. There is a small right chest tube in place. No significant residual right effusion. Left effusion has resolved since the prior study. Small new focal areas of patchy infiltrate in the anterior aspect of the left upper lobe. Upper Abdomen: There is extensive tumor along the superior surface of the right hemidiaphragm depressing the right hemidiaphragm and the right lobe of the liver. Musculoskeletal: No chest wall abnormality. No acute or significant osseous findings. Review of the MIP images confirms the above findings. IMPRESSION: 1. Very rapid increase in extensive tumor in the right hemithorax since the PET-CT scan  done 11 days ago. 2. Shift of the heart and mediastinal structures to the left with depression of the right hemidiaphragm due to the extensive tumor in the right hemithorax and in the mediastinum. 3. No pulmonary emboli. 4. Further compression of the right lung by tumor. 5. Expansion of loculated fluid in the right midzone. 6. Resolution of left pleural effusion. Critical Value/emergent results were called by telephone at the time of interpretation on 03/12/2018 at 12:11 pm to [place], NP, who verbally acknowledged these results. Electronically Signed   By: Lorriane Shire M.D.   On: 03/12/2018 12:19   Mr Jeri Cos TO Contrast  Result Date: 02/24/2018 CLINICAL DATA:  New diagnosis lung carcinoma. EXAM: MRI HEAD WITHOUT AND WITH CONTRAST TECHNIQUE: Multiplanar, multiecho pulse sequences of the brain and surrounding structures were obtained without and with intravenous contrast. CONTRAST:  58mL MULTIHANCE GADOBENATE DIMEGLUMINE 529 MG/ML IV SOLN COMPARISON:  None. FINDINGS: Brain: Diffusion imaging does not show any acute or subacute infarction. Brainstem is normal. 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 There is old infarction in the deep insula and right posterior frontal brain. Mild chronic small-vessel changes seen elsewhere within the hemispheric white matter. There is no evidence of metastatic disease. No hemorrhage, hydrocephalus or extra-axial collection. Vascular: Major vessels at the base of the brain show flow. Skull and upper cervical spine: Negative Sinuses/Orbits: Clear/normal Other: None IMPRESSION: No evidence of metastatic disease.  Old ischemic changes as above. Electronically Signed   By: Nelson Chimes  M.D.   On: 02/24/2018 11:58   Nm Pet Image Initial (pi) Skull Base To Thigh  Result Date: 03/01/2018 CLINICAL DATA:  Initial treatment strategy for right lung carcinoma. EXAM: NUCLEAR MEDICINE PET SKULL BASE TO THIGH TECHNIQUE: 9.6 mCi F-18 FDG was injected intravenously. Full-ring  PET imaging was performed from the skull base to thigh after the radiotracer. CT data was obtained and used for attenuation correction and anatomic localization. Fasting blood glucose: 86 mg/dl COMPARISON:  Chest CT on 02/21/2018 FINDINGS: (Mediastinal blood pool activity: SUV max = 1.7) NECK:  No hypermetabolic lymph nodes or masses. Incidental CT findings:  None. CHEST: No hypermetabolic lymphadenopathy. Right pleural effusion is decreased in size since previous study and right pleural drain is seen in place. Large masslike opacity in the anterior inferior right upper lobe is seen which abuts the anterior chest wall and measures approximately 12.0 x 6.5 cm on image 87/3. This has SUV max of 12.0, and is suspicious for primary bronchogenic carcinoma. Bulky hypermetabolic soft tissue density is seen throughout the right pleural space, consistent with pleural metastatic disease. Small left pleural effusion is seen, without FDG uptake. No suspicious left lung nodules or masses identified. Incidental CT findings:  None. ABDOMEN/PELVIS: No abnormal hypermetabolic activity within the liver, pancreas, adrenal glands, or spleen. No hypermetabolic lymph nodes in the abdomen or pelvis. Incidental CT findings:  Mild ascites seen, without FDG uptake. SKELETON: No focal hypermetabolic bone lesions to suggest skeletal metastasis. Incidental CT findings:  None. IMPRESSION: Large hypermetabolic masslike opacity in the anterior right upper lobe, suspicious for primary bronchogenic carcinoma. Bulky hypermetabolic soft tissue density throughout the right pleural space, consistent with metastatic disease. No evidence of metastatic disease within the neck, abdomen, or pelvis. Electronically Signed   By: Earle Gell M.D.   On: 03/01/2018 11:28   Dg Chest Port 1 View  Result Date: 02/26/2018 CLINICAL DATA:  Chest tube in place.  Lung cancer. EXAM: PORTABLE CHEST 1 VIEW COMPARISON:  One-view chest x-ray 02/23/2018 FINDINGS: Heart size  is normal. A right IJ Port-A-Cath is stable. Two right-sided chest tubes are stable in position. Interstitial and airspace disease is increasing on the right. A right pleural effusion is suspected. The left lung is clear. The visualized soft tissues and bony thorax are unremarkable. No residual pneumothorax is present. IMPRESSION: 1. Increasing right pleural effusion and airspace disease. 2. PleurX catheter and right-sided chest tube stable in position. 3. Right IJ catheter stable. Electronically Signed   By: San Morelle M.D.   On: 02/26/2018 07:17   Dg Chest Port 1 View  Result Date: 02/23/2018 CLINICAL DATA:  Postoperative chest x-ray.  Tube placements. EXAM: PORTABLE CHEST 1 VIEW COMPARISON:  02/22/2018. FINDINGS: PowerPort catheter with tip over cavoatrial junction. Two right chest tubes noted with tip over the right upper chest. Significant reduction in right-sided pleural effusion with mild residual. No pneumothorax. Atelectatic changes are noted in the right upper and lower lungs. Follow-up exams to demonstrate resolution suggested. Heart size normal. No acute bony abnormality. IMPRESSION: 1.  PowerPort catheter noted with tip at the cavoatrial junction. 2. Two right chest tubes noted. Significant reduction in right-sided pleural effusion with mild residual. Atelectatic changes noted the right upper and lower lungs. Follow-up exams demonstrate resolution suggested. Electronically Signed   By: Marcello Moores  Register   On: 02/23/2018 16:10   Dg Chest Port 1 View  Result Date: 02/22/2018 CLINICAL DATA:  Post right-sided thoracentesis EXAM: PORTABLE CHEST 1 VIEW COMPARISON:  02/21/2018; chest CT-02/21/2018 FINDINGS:  Interval reduction in persistent moderate-sized right-sided effusion post thoracentesis. No pneumothorax. Slight reduction in previously noted right to left mediastinal shift. Otherwise, unchanged cardiac silhouette and mediastinal contours with persistent obscuration of the right heart  border secondary to right basilar consolidative opacities. Post PFO ligation. No new focal airspace opacities. The left hemithorax remains well aerated. No acute osseus abnormalities. IMPRESSION: Interval reduction in persistent moderate-sized right-sided effusion post thoracentesis. No pneumothorax. Electronically Signed   By: Sandi Mariscal M.D.   On: 02/22/2018 10:41   Dg C-arm 1-60 Min-no Report  Result Date: 02/23/2018 Fluoroscopy was utilized by the requesting physician.  No radiographic interpretation.   US Thoracentesis Asp Pleural Space W/img Guide  Result Date: 02/22/2018 INDICATION: No known primary, now with multiple pleural based masses and symptomatic right-sided pleural effusion. Please perform ultrasound-guided thoracentesis for diagnostic and therapeutic purposes. EXAM: US THORACENTESIS ASP PLEURAL SPACE W/IMG GUIDE COMPARISON:  Chest radiograph - 02/21/2018; chest CT - 02/21/2018 MEDICATIONS: None. COMPLICATIONS: None immediate. TECHNIQUE: Informed written consent was obtained from the patient after a discussion of the risks, benefits and alternatives to treatment. A timeout was performed prior to the initiation of the procedure. Initial ultrasound scanning demonstrates a large anechoic right-sided pleural effusion. The lower chest was prepped and draped in the usual sterile fashion. 1% lidocaine was used for local anesthesia. An ultrasound image was saved for documentation purposes. An 8 Fr Safe-T-Centesis catheter was introduced. The thoracentesis was performed. The catheter was removed and a dressing was applied. The patient tolerated the procedure well without immediate post procedural complication. The patient was escorted to have an upright chest radiograph. FINDINGS: A total of approximately 2 liters of serous, slightly blood tinged fluid was removed. Requested samples were sent to the laboratory. IMPRESSION: Successful ultrasound-guided right sided thoracentesis yielding 2 liters of  pleural fluid. Electronically Signed   By: Sandi Mariscal M.D.   On: 02/22/2018 11:06     Assessment and plan- Patient is a 47 y.o. male who presents for worsening shortness of breath, orthopnea bilateral lower extremity swelling.  1.  Stage IV lung cancer: Patient was recently hospitalized for shortness of breath and found to have a large  right pleural effusion with near complete collapse of the right lung.  Thoracentesis revealing malignant cells.  Cardiothoracic surgery was consulted and patient underwent talc pleurodesis with Pleurx catheter placement and lung biopsy.  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  PET scan from 03/01/2018 revealed large hypermetabolic mass in right upper lobe and bulky hypermetabolic soft tissue density within the right pleural space.  There is no evidence of metastatic disease within the neck abdomen or pelvis.  Awaiting results from PDL 1 and NGS to finalize treatment planning. Scheduled to see Dr. B back next week to finalize treatment.   2.  Elevated lactic acid: Lactic 2.1 today.  Unlikely related to sepsis.  Patient has been afebrile  white count is normal.  This is likely related to malignancy.  Treatment is removal of the tumor by chemotherapy, radiation or surgery.  This usually corrects lactic acidosis.  3.  Worsening shortness of breath: Patient was seen and [facility] on 03/07/2018 for productive cough and congestion.  Chest x-ray was performed revealing consolidation of right lung.  He was started on Levaquin 500 mg daily and Medrol Dosepak for CAP. He also received a DuoNeb while in clinic.  Patient began to feel a little better but over the weekend developed worsening shortness of breath.   During initial assessment today,  he is visibly short of breath.  Oxygen saturations are okay at 93-96%.  Weight is up 8 pounds since last visit.  He has bilateral lower extremity swelling.  BNP was negative for heart failure exacerbation.   PLAN: STAT  Labs: CBC. CMET, Mag, Lactic, BNP. Lactic Acid 2.1.  STAT CTA: Negative for PE.  Revealed very rapid increase in extensive tumor in the right hemithorax since PET scan 11 days ago.  Shift of the heart and mediastinal structures in the left with depression of the right hemidiaphragm due to the extensive tumor in the right hemithorax.  Compression of right lung by tumor and expansion of loculated fluid in right mid zone. Given the significant increase in tumor, consulted with Dr. Tasia Catchings and we will have to start chemotherapy tomorrow with Carbo/Taxol.  We will not be able to wait for molecular studies to return.  Patient has already had chemo education last week.  We will get him scheduled for chemo tomorrow.  We will also see if radiation can accommodate him today or tomorrow for consultation.  Radiation appointment scheduled for 9 AM tomorrow morning.  Patient already has Port-A-Cath in place.  We will evaluate patient's oxygen saturations while ambulating to see if he qualifies for home O2.  Visit Diagnosis 1. Shortness of breath   2. Pleural effusion on right     Patient expressed understanding and was in agreement with this plan. He also understands that He can call clinic at any time with any questions, concerns, or complaints.   Greater than 50% was spent in counseling and coordination of care with this patient including but not limited to discussion of the relevant topics above (See A&P) including, but not limited to diagnosis and management of acute and chronic medical conditions.    Faythe Casa, AGNP-C [facility] at [place]- [phone] Pager- [phone] 03/12/2018 1:24 PM

## 2018-03-12 NOTE — Progress Notes (Signed)
Send Rx of Morphin Sulfate 20mg  Q4 hours for pain and SOB. Patient reports not using percocet. Will delete from his medication list

## 2018-03-12 NOTE — Addendum Note (Signed)
Addended by: Earlie Server on: 03/12/2018 12:21 PM   Modules accepted: Orders

## 2018-03-12 NOTE — Progress Notes (Signed)
START ON PATHWAY REGIMEN - Non-Small Cell Lung     A cycle is every 21 days:     Paclitaxel      Carboplatin      Bevacizumab   **Always confirm dose/schedule in your pharmacy ordering system**  Patient Characteristics: Stage IV Metastatic, Nonsquamous, Initial Chemotherapy/Immunotherapy, PS = 0, 1, PD-L1 Expression Positive 1-49% (TPS) / Negative / Not Tested / Awaiting Test Results AJCC T Category: T4 Current Disease Status: Distant Metastases AJCC N Category: N0 AJCC M Category: M1 AJCC 8 Stage Grouping: IV Histology: Nonsquamous Cell ROS1 Rearrangement Status: Awaiting Test Results T790M Mutation Status: Not Applicable - EGFR Mutation Negative/Unknown Other Mutations/Biomarkers: No Other Actionable Mutations NTRK Gene Fusion Status: Awaiting Test Results PD-L1 Expression Status: Awaiting Test Results Chemotherapy/Immunotherapy LOT: Initial Chemotherapy/Immunotherapy Molecular Targeted Therapy: Not Appropriate ALK Translocation Status: Awaiting Test Results EGFR Mutation Status: Awaiting Test Results BRAF V600E Mutation Status: Awaiting Test Results Performance Status: PS = 0, 1 Intent of Therapy: Non-Curative / Palliative Intent, Discussed with Patient

## 2018-03-12 NOTE — Telephone Encounter (Signed)
Patient called and states that he is no better since diagnosed with pneumonia and would like to be seen today. Per Lorretta Harp, NP have him come in now. He states he will be here at 10AM

## 2018-03-13 ENCOUNTER — Inpatient Hospital Stay: Payer: No Typology Code available for payment source

## 2018-03-13 ENCOUNTER — Ambulatory Visit: Payer: Self-pay | Admitting: Oncology

## 2018-03-13 ENCOUNTER — Other Ambulatory Visit: Payer: Self-pay

## 2018-03-13 ENCOUNTER — Encounter: Payer: Self-pay | Admitting: *Deleted

## 2018-03-13 ENCOUNTER — Inpatient Hospital Stay (HOSPITAL_BASED_OUTPATIENT_CLINIC_OR_DEPARTMENT_OTHER): Payer: No Typology Code available for payment source | Admitting: Oncology

## 2018-03-13 ENCOUNTER — Ambulatory Visit
Admission: RE | Admit: 2018-03-13 | Discharge: 2018-03-13 | Disposition: A | Discharge status: Home / Self Care | Payer: No Typology Code available for payment source | Source: Ambulatory Visit | Attending: Radiation Oncology | Admitting: Radiation Oncology

## 2018-03-13 VITALS — BP 154/69 | HR 98 | Temp 96.6°F | Resp 18

## 2018-03-13 DIAGNOSIS — R0609 Other forms of dyspnea: Secondary | ICD-10-CM | POA: Insufficient documentation

## 2018-03-13 DIAGNOSIS — J189 Pneumonia, unspecified organism: Secondary | ICD-10-CM | POA: Diagnosis not present

## 2018-03-13 DIAGNOSIS — R5383 Other fatigue: Secondary | ICD-10-CM

## 2018-03-13 DIAGNOSIS — Z959 Presence of cardiac and vascular implant and graft, unspecified: Secondary | ICD-10-CM | POA: Diagnosis not present

## 2018-03-13 DIAGNOSIS — C3431 Malignant neoplasm of lower lobe, right bronchus or lung: Secondary | ICD-10-CM

## 2018-03-13 DIAGNOSIS — J90 Pleural effusion, not elsewhere classified: Secondary | ICD-10-CM | POA: Insufficient documentation

## 2018-03-13 DIAGNOSIS — R590 Localized enlarged lymph nodes: Secondary | ICD-10-CM

## 2018-03-13 DIAGNOSIS — R5381 Other malaise: Secondary | ICD-10-CM | POA: Diagnosis not present

## 2018-03-13 DIAGNOSIS — I10 Essential (primary) hypertension: Secondary | ICD-10-CM

## 2018-03-13 DIAGNOSIS — R0602 Shortness of breath: Secondary | ICD-10-CM

## 2018-03-13 DIAGNOSIS — Z792 Long term (current) use of antibiotics: Secondary | ICD-10-CM

## 2018-03-13 DIAGNOSIS — Z79899 Other long term (current) drug therapy: Secondary | ICD-10-CM

## 2018-03-13 DIAGNOSIS — Z7189 Other specified counseling: Secondary | ICD-10-CM

## 2018-03-13 DIAGNOSIS — F419 Anxiety disorder, unspecified: Secondary | ICD-10-CM

## 2018-03-13 DIAGNOSIS — Z720 Tobacco use: Secondary | ICD-10-CM | POA: Insufficient documentation

## 2018-03-13 DIAGNOSIS — Q211 Atrial septal defect: Secondary | ICD-10-CM | POA: Diagnosis not present

## 2018-03-13 DIAGNOSIS — Z9989 Dependence on other enabling machines and devices: Secondary | ICD-10-CM | POA: Diagnosis not present

## 2018-03-13 DIAGNOSIS — Z9889 Other specified postprocedural states: Secondary | ICD-10-CM | POA: Diagnosis not present

## 2018-03-13 DIAGNOSIS — G4733 Obstructive sleep apnea (adult) (pediatric): Secondary | ICD-10-CM | POA: Diagnosis not present

## 2018-03-13 DIAGNOSIS — G4700 Insomnia, unspecified: Secondary | ICD-10-CM | POA: Insufficient documentation

## 2018-03-13 DIAGNOSIS — J910 Malignant pleural effusion: Secondary | ICD-10-CM | POA: Diagnosis not present

## 2018-03-13 DIAGNOSIS — Z8673 Personal history of transient ischemic attack (TIA), and cerebral infarction without residual deficits: Secondary | ICD-10-CM

## 2018-03-13 DIAGNOSIS — Z7982 Long term (current) use of aspirin: Secondary | ICD-10-CM | POA: Diagnosis not present

## 2018-03-13 DIAGNOSIS — Z87891 Personal history of nicotine dependence: Secondary | ICD-10-CM

## 2018-03-13 MED ORDER — SODIUM CHLORIDE 0.9% FLUSH
10.0000 mL | INTRAVENOUS | Status: DC | PRN
  Filled 2018-03-13: qty 10

## 2018-03-13 MED ORDER — DIPHENHYDRAMINE HCL 50 MG/ML IJ SOLN
25.0000 mg | Freq: Once | INTRAMUSCULAR | Status: AC
  Administered 2018-03-13: 25 mg via INTRAVENOUS

## 2018-03-13 MED ORDER — PROCHLORPERAZINE MALEATE 10 MG PO TABS: 10.0000 mg | ORAL_TABLET | Freq: Four times a day (QID) | ORAL | 1 refills | Status: DC | PRN

## 2018-03-13 MED ORDER — PACLITAXEL CHEMO INJECTION 300 MG/50ML
200.0000 mg/m2 | Freq: Once | INTRAVENOUS | Status: AC
  Administered 2018-03-13: 384 mg via INTRAVENOUS
  Filled 2018-03-13: qty 64

## 2018-03-13 MED ORDER — FAMOTIDINE IN NACL 20-0.9 MG/50ML-% IV SOLN
20.0000 mg | Freq: Once | INTRAVENOUS | Status: AC
  Administered 2018-03-13: 20 mg via INTRAVENOUS
  Filled 2018-03-13: qty 50

## 2018-03-13 MED ORDER — PALONOSETRON HCL INJECTION 0.25 MG/5ML
0.2500 mg | Freq: Once | INTRAVENOUS | Status: AC
  Administered 2018-03-13: 0.25 mg via INTRAVENOUS
  Filled 2018-03-13: qty 5

## 2018-03-13 MED ORDER — DIPHENHYDRAMINE HCL 50 MG/ML IJ SOLN
INTRAMUSCULAR | Status: AC
  Filled 2018-03-13: qty 1

## 2018-03-13 MED ORDER — METHYLPREDNISOLONE SODIUM SUCC 125 MG IJ SOLR
125.0000 mg | Freq: Once | INTRAMUSCULAR | Status: AC
  Administered 2018-03-13: 125 mg via INTRAVENOUS

## 2018-03-13 MED ORDER — SODIUM CHLORIDE 0.9 % IV SOLN
20.0000 mg | Freq: Once | INTRAVENOUS | Status: AC
  Administered 2018-03-13: 20 mg via INTRAVENOUS
  Filled 2018-03-13: qty 2

## 2018-03-13 MED ORDER — SODIUM CHLORIDE 0.9 % IV SOLN
700.0000 mg | Freq: Once | INTRAVENOUS | Status: AC
  Administered 2018-03-13: 700 mg via INTRAVENOUS
  Filled 2018-03-13: qty 70

## 2018-03-13 MED ORDER — HEPARIN SOD (PORK) LOCK FLUSH 100 UNIT/ML IV SOLN
500.0000 [IU] | Freq: Once | INTRAVENOUS | Status: AC | PRN
  Administered 2018-03-13: 500 [IU]
  Filled 2018-03-13: qty 5

## 2018-03-13 MED ORDER — SODIUM CHLORIDE 0.9 % IV SOLN
Freq: Once | INTRAVENOUS | Status: AC
  Administered 2018-03-13: 10:00:00 via INTRAVENOUS
  Filled 2018-03-13: qty 1000

## 2018-03-13 MED ORDER — DIPHENHYDRAMINE HCL 50 MG/ML IJ SOLN
50.0000 mg | Freq: Once | INTRAMUSCULAR | Status: AC
  Administered 2018-03-13: 50 mg via INTRAVENOUS
  Filled 2018-03-13: qty 1

## 2018-03-13 MED ORDER — METHYLPREDNISOLONE SODIUM SUCC 125 MG IJ SOLR
INTRAMUSCULAR | Status: AC
  Filled 2018-03-13: qty 2

## 2018-03-13 NOTE — Progress Notes (Signed)
  Oncology Nurse Navigator Documentation  Navigator Location: [facility]-Med Onc (03/13/18 1200)   )Navigator Encounter Type: Treatment;Initial RadOnc (03/13/18 1200)                   Treatment Initiated Date: 03/13/18 (03/13/18 1200) Patient Visit Type: MedOnc (03/13/18 1200) Treatment Phase: First Chemo Tx (03/13/18 1200) Barriers/Navigation Needs: Coordination of Care (03/13/18 1200)   Interventions: Coordination of Care (03/13/18 1200)   Coordination of Care: Appts (03/13/18 1200)     met with patient and wife prior to first chemo and initial rad-onc consultation with Dr. Baruch Gouty. Pt and wife anxious to start treatment before cancer progresses further. Reassurance provided. All questions answered at the time of visit. Reviewed upcoming appts. Pt and wife had no further questions or needs.             Time Spent with Patient: 60 (03/13/18 1200)

## 2018-03-13 NOTE — Progress Notes (Signed)
1130: O2 sats 85% on RA at rest. Pt noted to be coughing throughout the time in infusion. He has no s/s of infusion related reaction at this time. Dr. Tasia Catchings at chairside and pt placed on 2 liter O2 [place] per MD order and per Dr. Tasia Catchings okay to proceed with Taxol as this does not appear to be an infusion reaction. 1218: pt denies any s/s other than coughing. O2 sats remaining between 89%-90% on 4 liters O2 [place]. Per Dr. Tasia Catchings, paused taxol and give medications (see MAR) and monitor for 30 minutes.  1300: pt stable and VS stable. O2 sats continue to remain between 89-90% Per Dr. Tasia Catchings okay to restart Taxol infusion. O2 [place] to remain at 4 liters. No further orders at this time. Pt stable at this time. 1619: Pt O2 sat is 85 on RA. Dr. Tasia Catchings at chairside. Pt is asymptomatic. Per Dr. Tasia Catchings okay to discharge pt home, order has been sent in for pt to receive O2 at home per [facility]. Pt educated to monitor Sats at home (pt states he does have an O2 monitor) and if low sats or s/s arise to go to ER. Pt verbalizes understanding. Pt stable at discharge.

## 2018-03-13 NOTE — Progress Notes (Unsigned)
Patient here today for follow up. Pt very short of breath this morning due to walking, pulse ox 91% on room air.

## 2018-03-13 NOTE — Consult Note (Signed)
NEW PATIENT EVALUATION  Name: Ruben Eaton  MRN: [id number]  Date:   03/13/2018     DOB: 1971-03-31   This 47 y.o. 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  REFERRING PHYSICIAN: Dion Body, MD  CHIEF COMPLAINT: No chief complaint on file.   DIAGNOSIS: The encounter diagnosis was Primary cancer of right lower lobe of lung ([place]).   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 Patient underwent paracentesis which showed malignant cells. He then underwent a talc pleurodesis. He also had pleural biopsy by Dr. Faith Rogue showing high-grade carcinoma with sarcomatoid features.CT scan PET CT scan showed a large hypermetabolic mass opacity anterior right upper chest suspicious for primary bronchogenic carcinoma. There was also hypermetabolic soft tissue density throughout the right pleural space consistent with metastatic disease. No distant metastatic disease was noted.patient's been undergoing evaluation by medical oncology for stage IV disease although in 2 weeks on CT scan had marked progression of disease throughout the right chest.patient is a is plan to start chemotherapy with Taxol is now referred to radiation oncology on an emergent basis for possible palliative treatment. Patient is doing fairly well he does continue to have shortness of breath dyspnea on exertion no hemoptysis.  PLANNED TREATMENT REGIMEN: palliative radiation therapy to right chest  PAST MEDICAL HISTORY:  has a past medical history of Cryptogenic stroke ([place])  (08/2016), CVA (cerebral vascular accident) ([place]) (09/07/2016), ED (erectile dysfunction), Essential (primary) hypertension (02/01/2016), History of stroke (10/08/2016), insomnia, Hypertension, OSA on CPAP, PFO (patent foramen ovale), Pleural effusion on right (02/21/2018), Primary cancer of right lower lobe of lung ([place]) (02/25/2018), PVC's (premature ventricular contractions) (10/08/2016), Quadriceps tendinitis (02/01/2016), and TIA (transient ischemic attack) (09/07/2016).    PAST SURGICAL HISTORY:  Past Surgical History:  Procedure Laterality Date  . APPENDECTOMY  1980  . EYE SURGERY    . PATENT FORAMEN OVALE CLOSURE  10/07/2016  . PATENT FORAMEN OVALE CLOSURE N/A 10/07/2016   Procedure: Patent [place](PFO) Closure;  Surgeon: [name], MD;  Location: [facility];  Service: Cardiovascular;  Laterality: N/A;  . PORTACATH PLACEMENT N/A 02/23/2018   Procedure: INSERTION PORT-A-CATH;  Surgeon: Nestor Lewandowsky, MD;  Location: [facility] ORS;  Service: Thoracic;  Laterality: N/A;  . STRABISMUS SURGERY Left 1984  . TEE WITHOUT CARDIOVERSION N/A 09/08/2016   Procedure: TRANSESOPHAGEAL ECHOCARDIOGRAM (TEE);  Surgeon: [name], MD;  Location: [facility] ORS;  Service: Cardiovascular;  Laterality: N/A;  . VIDEO ASSISTED THORACOSCOPY (VATS) W/TALC PLEUADESIS Right 02/23/2018   Procedure: VIDEO ASSISTED THORACOSCOPY (VATS) W/TALC PLEUADESIS;  Surgeon: Nestor Lewandowsky, MD;  Location: [facility] ORS;  Service: Thoracic;  Laterality: Right;  . VITRECTOMY Right 2013    FAMILY HISTORY: family history includes Other in his paternal uncle.  SOCIAL HISTORY:  reports that he quit smoking about 8 years ago. He has a 5.00 pack-year smoking history. He has quit using smokeless tobacco. His smokeless tobacco use included snuff. He reports that he drinks about 1.8 oz of alcohol per week. He reports that he does not use drugs.  ALLERGIES: Patient has no known allergies.  MEDICATIONS:  Current Outpatient Medications   Medication Sig Dispense Refill  . aspirin EC 81 MG EC tablet Take 1 tablet (81 mg total) by mouth daily. 120 tablet 0  . atorvastatin (LIPITOR) 40 MG tablet Take 1 tablet (40 mg total) by mouth daily at 6 PM. (Patient not taking: Reported on 03/13/2018) 30 tablet 0  . b complex vitamins capsule Take 1 capsule by mouth daily.     Marland Kitchen dexamethasone (DECADRON) 4 MG tablet Take 2 tablets (8 mg total) by mouth daily. Start the day after chemotherapy for 2 days. 30 tablet 1  . Diclofenac Sodium 2 % SOLN Apply 1 pump twice daily as needed. (Patient taking differently: Apply 1 application topically 2 (two) times daily as needed (pain). Apply 1 pump twice daily as needed.) 112 g 3  . levofloxacin (LEVAQUIN) 500 MG tablet Take 1 tablet (500 mg total) by mouth daily. (Patient not taking: Reported on 03/13/2018) 7 tablet 0  . loratadine (CLARITIN) 10 MG tablet Take 10 mg by mouth daily.     Marland Kitchen LORazepam (ATIVAN) 0.5 MG tablet Take 1 tablet (0.5 mg total) by mouth every 8 (eight) hours as needed for anxiety. 30 tablet 0  . metoprolol tartrate (LOPRESSOR) 25 MG tablet Take 1 tablet (25 mg total) by mouth 2 (two) times daily. 60 tablet 2  . Morphine Sulfate (MORPHINE CONCENTRATE) 10 mg / 0.5 ml concentrated solution Take 1 mL (20 mg total) by mouth every 4 (four) hours as needed for shortness of breath. 118 mL 0  . Multiple Vitamin (MULTIVITAMIN WITH MINERALS) TABS tablet Take 1 tablet by mouth daily.    . ondansetron (ZOFRAN) 8 MG tablet Take 1 tablet (8 mg total) by mouth 2 (two) times daily as needed for refractory nausea / vomiting. 30 tablet 1  . prochlorperazine (COMPAZINE) 10 MG tablet Take 1 tablet (10 mg total) by mouth every 6 (six) hours as needed (Nausea or vomiting). 30 tablet 1  . senna-docusate (SENOKOT-S) 8.6-50 MG tablet Take 1 tablet by mouth daily. 30 tablet 0  . tadalafil (CIALIS) 5 MG tablet Take 5 mg by mouth daily as needed for erectile dysfunction.    . triamcinolone (NASACORT) 55 MCG/ACT AERO  nasal inhaler Place 2 sprays into the nose daily as needed (congestion).     . Vitamin D, Ergocalciferol, (DRISDOL) 50000 units CAPS capsule Take 1 capsule (50,000 Units total) by mouth every 7 (seven) days. (Patient taking differently: Take 50,000 Units by mouth every 7 (seven) days. Mondays) 12 capsule 0  . zolpidem (AMBIEN) 10 MG tablet Take 10 mg by mouth at bedtime as needed for sleep.      No current facility-administered medications for this encounter.    Facility-Administered Medications Ordered in Other Encounters  Medication Dose Route Frequency Provider Last Rate Last Dose  . CARBOplatin (PARAPLATIN) 700 mg in sodium chloride 0.9 % 250 mL chemo infusion  700 mg Intravenous Once Earlie Server, MD      . heparin lock flush 100 unit/mL  500 Units Intracatheter Once PRN Earlie Server, MD      . PACLitaxel (TAXOL) 384 mg in sodium chloride 0.9 % 500 mL chemo infusion (> 80mg /m2)  200 mg/m2 (Treatment Plan Recorded) Intravenous Once Earlie Server, MD 141 mL/hr at 03/13/18 1300    . sodium chloride flush (NS) 0.9 % injection 10 mL  10 mL Intracatheter PRN Earlie Server, MD        ECOG PERFORMANCE STATUS:  1 - Symptomatic but completely ambulatory  REVIEW  OF SYSTEMS: except for thesymptoms associated with his lung cancer Patient denies any weight loss, fatigue, weakness, fever, chills or night sweats. Patient denies any loss of vision, blurred vision. Patient denies any ringing  of the ears or hearing loss. No irregular heartbeat. Patient denies heart murmur or history of fainting. Patient denies any chest pain or pain radiating to her upper extremities. Patient denies any shortness of breath, difficulty breathing at night, cough or hemoptysis. Patient denies any swelling in the lower legs. Patient denies any nausea vomiting, vomiting of blood, or coffee ground material in the vomitus. Patient denies any stomach pain. Patient states has had normal bowel movements no significant constipation or diarrhea. Patient  denies any dysuria, hematuria or significant nocturia. Patient denies any problems walking, swelling in the joints or loss of balance. Patient denies any skin changes, loss of hair or loss of weight. Patient denies any excessive worrying or anxiety or significant depression. Patient denies any problems with insomnia. Patient denies excessive thirst, polyuria, polydipsia. Patient denies any swollen glands, patient denies easy bruising or easy bleeding. Patient denies any recent infections, allergies or URI. Patient "s visual fields have not changed significantly in recent time.   PHYSICAL EXAM: There were no vitals taken for this visit. Thin slightly cachectic male in NAD he has decreased breath breath sounds in his right lung fields. Well-developed well-nourished patient in NAD. HEENT reveals PERLA, EOMI, discs not visualized.  Oral cavity is clear. No oral mucosal lesions are identified. Neck is clear without evidence of cervical or supraclavicular adenopathy. Lungs are clear to A&P. Cardiac examination is essentially unremarkable with regular rate and rhythm without murmur rub or thrill. Abdomen is benign with no organomegaly or masses noted. Motor sensory and DTR levels are equal and symmetric in the upper and lower extremities. Cranial nerves II through XII are grossly intact. Proprioception is intact. No peripheral adenopathy or edema is identified. No motor or sensory levels are noted. Crude visual fields are within normal range.  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  PLAN: at this time I have recommended a course of palliative radiation therapy to his chest. Would plan on delivering 4000 cGy over 4 weeks  evaluate for response. I will target tumor based on his original PET CT criteria. Risks and benefits of treatment including possible dysphasia from radiation esophagitis fatigue alteration of blood counts cough and skin reaction all were discussed in detail. Patient seems to comprehend my treatment plan well. I've firstly set up and ordered CT simulation for tomorrow and will start his treatments first thing next week. We will also coordinate his chemotherapy.There will be extra effort by both professional staff as well as technical staff to coordinate and manage concurrent chemoradiation and ensuing side effects during his treatments.   I would like to take this opportunity to thank you for allowing me to participate in the care of your patient.Noreene Filbert, MD

## 2018-03-13 NOTE — Progress Notes (Signed)
Hematology/Oncology Follow Up Note [facility]  [phone] [phone]  Patient Care Team: Dion Body, MD as PCP - General (Family Medicine) [name], MD as PCP - Cardiology (Cardiology) Telford Nab, RN as Registered Nurse   Name of the patient: Ruben Eaton  [id number]  1971-08-13   REASON FOR VISIT Follow up for assessment prior to chemotherapy treatment for newly diagnosed lung cancer. HPI 47 year old male with history of hypertension, previous stroke, PFO, who was recently hospitalized due to shortness of breath and found to have large pleural effusion.  Status post paracentesis which showed malignant cells.  Patient underwent talc pleurodesis, Pleurx catheter placement and pleural biopsy. Patient presents to discuss cancer diagnosis and treatment plan.  Patient was evaluated by Dr. Imelda Pillow during his hospitalization.  I am covering Dr. Rogue Bussing to see this patient today.   Data Reviewed:  CT chest: Independently reviewed which showed large right-sided pleural effusion, 7 cm anterior pleural-based mass, multiple pleural-based mass.  Mediastinal stye no adenopathy. MRI brain was independently reviewed by me which showed no intracranial metastatic disease.  PET scan was independently reviewed by me imageand  which showed no hypermetabolic lymphadenopathy.  Right pleural effusion is decreased in size and the right pleural drain is seen in place.  Large masslike opacity in the anterior inferior right upper lobe is seen which abuts the anterior chest wall and measures about 12 x 6.5 cm.  SUV 12.  Bulky hypermetabolic soft tissue density is seen throughout the right pleural space, consistent with pleural metastatic disease.  Small left pleural effusion seen without FDG uptake.  No abnormal hypermetabolic activity within the liver pancreas adrenal glands and spleen.  No evidence of metastatic disease within the neck abdomen or  pelvis.  Pathology of Pleural biopsy showed high-grade carcinoma with sarcomatoid features.  INTERVAL HISTORY Ruben Eaton is a 47 y.o. 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 Patient is accompanied by his wife.     Problems and complaints are listed below: He was last seen by me on March 02, 2018 to discuss about pathology results.  At that time he is minimally symptomatic, breathing comfortably.  His Pleurx drainage she is about at 10 cc every couple days.  We had a discussion about his pathology since he is asymptomatic, we agreed on waiting for NGS results to come back and to finalize treatment plan. On March 07, 2018, patient called and reported feeling a lot more shortness of breath, Productive cough,, not associated with fever or chills..  She was seen by symptom management NP Faythe Casa.   he got x-ray which was independently reviewed by me and showed no appreciated change compared to most recent chest x-ray.  He was started with a course of Levaquin 500 mg daily and Medrol Dosepak for community acquired pneumonia. On March 12, 2018, patient reports rapidly worsening of symptoms, productive cough, dyspnea, he was seen by NP Faythe Casa.  Pulmonary embolism was suspected, patient got a stat CT angiogram.  CT chest angiogram was independently reviewed by me.  Unfortunately it showed rapid progression of his lung cancer.  Today he presented for assessment prior to chemotherapy with [place] and Taxol.  He has been to chemotherapy class. Shortness of breath: Rapid worsened compared to last week, slightly better after he was started on morphine.  Yesterday in the clinic, ambulatory oxygen saturation  was 89%.  Today he is resting room air saturation was 91%. Fatigue: reports worsening fatigue. no aggravating or improving factors, no associated symptoms.  Cough,  continue to have productive cough.  Not associated with fever or chills.   Review of Systems  Constitutional: Positive for malaise/fatigue. Negative for chills, fever and weight loss.  HENT: Negative for congestion, ear discharge, ear pain, nosebleeds, sinus pain and sore throat.   Eyes: Negative for double vision, photophobia, pain, discharge and redness.  Respiratory: Positive for cough, sputum production and shortness of breath. Negative for hemoptysis and wheezing.   Cardiovascular: Negative for chest pain, palpitations, orthopnea, claudication and leg swelling.  Gastrointestinal: Negative for abdominal pain, blood in stool, constipation, diarrhea, heartburn, melena, nausea and vomiting.  Genitourinary: Negative for dysuria, flank pain, frequency and hematuria.  Musculoskeletal: Negative for back pain, myalgias and neck pain.  Skin: Negative for itching and rash.  Neurological: Negative for dizziness, tingling, tremors, focal weakness, weakness and headaches.  Endo/Heme/Allergies: Negative for environmental allergies. Does not bruise/bleed easily.  Psychiatric/Behavioral: Negative for depression and hallucinations. The patient is not nervous/anxious.       No Known Allergies  Past Medical History:  Diagnosis Date  . 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 09/15/2016 in setting of PFO.  . CVA (cerebral vascular accident) ([place]) 09/07/2016  . ED (erectile dysfunction)   . Essential (primary) hypertension 02/01/2016  . History of stroke 10/08/2016  . Hx of insomnia   . Hypertension   . OSA on CPAP   . PFO (patent foramen ovale)    a. s/p closure 09/2016 by Dr. [name].  . Pleural effusion on right 02/21/2018  . Primary cancer of right lower lobe of lung ([place]) 02/25/2018  . PVC's (premature ventricular contractions) 10/08/2016  . 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 TIA (transient ischemic attack) 09/07/2016     Past Surgical History:  Procedure Laterality Date  .  APPENDECTOMY  1980  . EYE SURGERY    . PATENT FORAMEN OVALE CLOSURE  10/07/2016  . PATENT FORAMEN OVALE CLOSURE N/A 10/07/2016   Procedure: Patent [place](PFO) Closure;  Surgeon: [name], MD;  Location: [facility];  Service: Cardiovascular;  Laterality: N/A;  . PORTACATH PLACEMENT N/A 02/23/2018   Procedure: INSERTION PORT-A-CATH;  Surgeon: Nestor Lewandowsky, MD;  Location: [facility];  Service: Thoracic;  Laterality: N/A;  . STRABISMUS SURGERY Left 1984  . TEE WITHOUT CARDIOVERSION N/A 09/08/2016   Procedure: TRANSESOPHAGEAL ECHOCARDIOGRAM (TEE);  Surgeon: [name], MD;  Location: [facility];  Service: Cardiovascular;  Laterality: N/A;  . VIDEO ASSISTED THORACOSCOPY (VATS) W/TALC PLEUADESIS Right 02/23/2018   Procedure: VIDEO ASSISTED THORACOSCOPY (VATS) W/TALC PLEUADESIS;  Surgeon: Nestor Lewandowsky, MD;  Location: [facility];  Service: Thoracic;  Laterality: Right;  . VITRECTOMY Right 2013    Social History   Socioeconomic History  . Marital status: Married    Spouse name: [name]   . Number of children: Not on file  . Years of education: Not on file  . Highest education level: Not on file  Occupational History  . Occupation: Software engineer  Social Needs  . Financial resource strain: Not on file  . Food insecurity:    Worry: Not on file    Inability: Not on file  . Transportation needs:    Medical: Not on file    Non-medical: Not on file  Tobacco Use  . Smoking status: Former Smoker    Packs/day: 0.50    Years:  10.00    Pack years: 5.00    Last attempt to quit: 2011    Years since quitting: 8.4  . Smokeless tobacco: Former Systems developer    Types: Snuff  Substance and Sexual Activity  . Alcohol use: Yes    Alcohol/week: 1.8 oz    Types: 3 Cans of beer per week  . Drug use: No  . Sexual activity: Yes  Lifestyle  . Physical activity:    Days per week: Not on file    Minutes per session: Not on file  . Stress: Not on file  Relationships  . Social connections:     Talks on phone: Not on file    Gets together: Not on file    Attends religious service: Not on file    Active member of club or organization: Not on file    Attends meetings of clubs or organizations: Not on file    Relationship status: Not on file  . Intimate partner violence:    Fear of current or ex partner: Not on file    Emotionally abused: Not on file    Physically abused: Not on file    Forced sexual activity: Not on file  Other Topics Concern  . Not on file  Social History Narrative  . Not on file    Family History  Problem Relation Age of Onset  . Other Paternal Uncle        Abdominal tumor      Current Outpatient Medications:  .  aspirin EC 81 MG EC tablet, Take 1 tablet (81 mg total) by mouth daily., Disp: 120 tablet, Rfl: 0 .  atorvastatin (LIPITOR) 40 MG tablet, Take 1 tablet (40 mg total) by mouth daily at 6 PM. (Patient not taking: Reported on 03/13/2018), Disp: 30 tablet, Rfl: 0 .  b complex vitamins capsule, Take 1 capsule by mouth daily. , Disp: , Rfl:  .  dexamethasone (DECADRON) 4 MG tablet, Take 2 tablets (8 mg total) by mouth daily. Start the day after chemotherapy for 2 days., Disp: 30 tablet, Rfl: 1 .  Diclofenac Sodium 2 % SOLN, Apply 1 pump twice daily as needed. (Patient taking differently: Apply 1 application topically 2 (two) times daily as needed (pain). Apply 1 pump twice daily as needed.), Disp: 112 g, Rfl: 3 .  levofloxacin (LEVAQUIN) 500 MG tablet, Take 1 tablet (500 mg total) by mouth daily. (Patient not taking: Reported on 03/13/2018), Disp: 7 tablet, Rfl: 0 .  loratadine (CLARITIN) 10 MG tablet, Take 10 mg by mouth daily. , Disp: , Rfl:  .  LORazepam (ATIVAN) 0.5 MG tablet, Take 1 tablet (0.5 mg total) by mouth every 8 (eight) hours as needed for anxiety., Disp: 30 tablet, Rfl: 0 .  metoprolol tartrate (LOPRESSOR) 25 MG tablet, Take 1 tablet (25 mg total) by mouth 2 (two) times daily., Disp: 60 tablet, Rfl: 2 .  Morphine Sulfate (MORPHINE  CONCENTRATE) 10 mg / 0.5 ml concentrated solution, Take 1 mL (20 mg total) by mouth every 4 (four) hours as needed for shortness of breath., Disp: 118 mL, Rfl: 0 .  Multiple Vitamin (MULTIVITAMIN WITH MINERALS) TABS tablet, Take 1 tablet by mouth daily., Disp: , Rfl:  .  ondansetron (ZOFRAN) 8 MG tablet, Take 1 tablet (8 mg total) by mouth 2 (two) times daily as needed for refractory nausea / vomiting., Disp: 30 tablet, Rfl: 1 .  predniSONE (STERAPRED UNI-PAK 21 TAB) 10 MG (21) TBPK tablet, Take as prescribed. (Patient not taking: Reported  on 03/13/2018), Disp: 21 tablet, Rfl: 0 .  prochlorperazine (COMPAZINE) 10 MG tablet, Take 1 tablet (10 mg total) by mouth every 6 (six) hours as needed (Nausea or vomiting)., Disp: 30 tablet, Rfl: 1 .  senna-docusate (SENOKOT-S) 8.6-50 MG tablet, Take 1 tablet by mouth daily., Disp: 30 tablet, Rfl: 0 .  tadalafil (CIALIS) 5 MG tablet, Take 5 mg by mouth daily as needed for erectile dysfunction., Disp: , Rfl:  .  triamcinolone (NASACORT) 55 MCG/ACT AERO nasal inhaler, Place 2 sprays into the nose daily as needed (congestion). , Disp: , Rfl:  .  Vitamin D, Ergocalciferol, (DRISDOL) 50000 units CAPS capsule, Take 1 capsule (50,000 Units total) by mouth every 7 (seven) days. (Patient taking differently: Take 50,000 Units by mouth every 7 (seven) days. Mondays), Disp: 12 capsule, Rfl: 0 .  zolpidem (AMBIEN) 10 MG tablet, Take 10 mg by mouth at bedtime as needed for sleep. , Disp: , Rfl:    Physical Exam  Constitutional: He is oriented to person, place, and time. He appears well-developed and well-nourished. No distress.  HENT:  Head: Normocephalic and atraumatic.  Right Ear: External ear normal.  Left Ear: External ear normal.  Mouth/Throat: Oropharynx is clear and moist.  Eyes: Pupils are equal, round, and reactive to light. Conjunctivae and EOM are normal. No scleral icterus.  Neck: Normal range of motion. Neck supple.  Cardiovascular: Normal rate, regular rhythm  and normal heart sounds.  Pulmonary/Chest: Effort normal. No respiratory distress. He has no wheezes. He has no rales. He exhibits no tenderness.  pleurx in place.  Significantly decreased right lung breath sound  Abdominal: Soft. Bowel sounds are normal. He exhibits no distension and no mass. There is no tenderness.  Musculoskeletal: Normal range of motion. He exhibits no edema or deformity.  Lymphadenopathy:    He has no cervical adenopathy.  Neurological: He is alert and oriented to person, place, and time. No cranial nerve deficit. Coordination normal.  Skin: Skin is warm and dry. No rash noted.  Psychiatric: He has a normal mood and affect. His behavior is normal. Thought content normal.  Nursing note and vitals reviewed.   CMP Latest Ref Rng & Units 03/12/2018  Glucose 65 - 99 mg/dL 114(H)  BUN 6 - 20 mg/dL 18  Creatinine 0.61 - 1.24 mg/dL 1.10  Sodium 135 - 145 mmol/L 135  Potassium 3.5 - 5.1 mmol/L 4.1  Chloride 101 - 111 mmol/L 103  CO2 22 - 32 mmol/L 24  Calcium 8.9 - 10.3 mg/dL 9.4  Total Protein 6.5 - 8.1 g/dL 5.6(L)  Total Bilirubin 0.3 - 1.2 mg/dL 0.7  Alkaline Phos 38 - 126 U/L 58  AST 15 - 41 U/L 26  ALT 17 - 63 U/L 28   CBC Latest Ref Rng & Units 03/12/2018  WBC 3.8 - 10.6 K/uL 10.3  Hemoglobin 13.0 - 18.0 g/dL 12.2(L)  Hematocrit 40.0 - 52.0 % 35.7(L)  Platelets 150 - 440 K/uL 390    Dg Chest 2 View  Result Date: 03/12/2018 CLINICAL DATA:  RIGHT-side pleural effusion, shortness of breath RIGHT lower lobe lung cancer, former smoker EXAM: CHEST - 2 VIEW COMPARISON:  03/07/2018 FINDINGS: RIGHT jugular Port-A-Cath with tip projecting over SVC. RIGHT thoracostomy tube. Stable heart size. Loculated RIGHT pleural effusion and nodularity throughout the RIGHT hemithorax from known malignant effusion. Mediastinal shift RIGHT to LEFT increased. LEFT lung clear. No LEFT pneumothorax identified. Osseous structures unremarkable. IMPRESSION: Known RIGHT pleural carcinomatosis  and malignant effusion with increased RIGHT pleural  opacity and RIGHT lung atelectasis since 03/07/2018. Increased RIGHT to LEFT midline shift. Electronically Signed   By: Lavonia Dana M.D.   On: 03/12/2018 11:35   Dg Chest 2 View  Result Date: 03/07/2018 CLINICAL DATA:  Lung carcinoma.  Cough. EXAM: CHEST - 2 VIEW COMPARISON:  March 05, 2018 FINDINGS: Right pleural drain is again noted with the tip at the right apex level. Port-A-Cath tip is in the superior vena cava. No pneumothorax. There are areas of apparent loculated effusion as well as consolidation throughout the right lung. The left lung is clear. Heart size and pulmonary vascularity are normal. IMPRESSION: No appreciable change compared to most recent chest radiograph. Areas of loculated effusion as well as consolidation and probable underlying mases on the right with opacification of much of the right lung, stable. Left lung clear. Stable cardiac silhouette. Electronically Signed   By: Lowella Grip III M.D.   On: 03/07/2018 10:10   Dg Chest 2 View  Result Date: 03/05/2018 CLINICAL DATA:  47 year old male with shortness of breath for 2 weeks. Right lung cancer diagnosed in the past several weeks. EXAM: CHEST - 2 VIEW COMPARISON:  PET-CT 03/01/2018 and earlier. FINDINGS: Right chest IJ approach power port appear stable, not currently accessed. Superimposed right pleural drain. Regressed large right pleural effusion and patchy improved right lung ventilation since the radiographs on 02/21/2018. Residual loculated pleural fluid and/or pleural thickening. Associated thickening of the right paratracheal stripe and hilum. Left mediastinal contours remain normal. A cardiac interatrial occluded device is re-demonstrated. Small left pleural effusion is stable since 03/01/2018, otherwise the left lung is clear. Stable visualized osseous structures. Negative visible bowel gas pattern. IMPRESSION: 1. Right pleural drain in place with regressed right pleural  effusion and patchy improved right lung ventilation since 02/21/2018. Residual loculated pleural fluid and/or bulky pleural thickening. 2. Stable small layering left pleural effusion. 3. No new cardiopulmonary abnormality identified. Electronically Signed   By: Genevie Ann M.D.   On: 03/05/2018 15:27   Dg Chest 2 View  Result Date: 02/21/2018 CLINICAL DATA:  Shortness of breath and chest pain beginning 3 days ago. EXAM: CHEST - 2 VIEW COMPARISON:  07/21/2006 FINDINGS: Left hemithorax is clear. ASD closure device overlies the heart. There is a large right effusion with near complete collapse of the right lung. Partial aeration of the right upper lobe. Mediastinum bows towards the left. No significant bone finding. IMPRESSION: Large right effusion with near complete collapse of the right lung. Partial aeration of the right upper lobe. Mediastinal shift towards the left. Left chest otherwise clear. ASD closure device. Electronically Signed   By: Nelson Chimes M.D.   On: 02/21/2018 16:20   Ct Chest W Contrast  Result Date: 02/21/2018 CLINICAL DATA:  Patient with shortness of breath. History of bronchitis. Large right pleural effusion. EXAM: CT CHEST WITH CONTRAST TECHNIQUE: Multidetector CT imaging of the chest was performed during intravenous contrast administration. CONTRAST:  18m ISOVUE-370 IOPAMIDOL (ISOVUE-370) INJECTION 76% COMPARISON:  Chest radiograph 02/21/2018 FINDINGS: Cardiovascular: Normal heart size. Trace fluid superior pericardial recess. Normal caliber aorta and main pulmonary artery. Mediastinum/Nodes: Leftward mediastinal shift secondary to large right pleural effusion. Enlarged mediastinal lymph nodes are demonstrated including a 1.3 cm prevascular lymph node (image 55; series 2). There is a 0.8 cm right paratracheal lymph node (image 52; series 2). Esophagus is poorly visualized. Lungs/Pleura: Mild leftward shift of the tracheal air column. There is a 6 mm nodule within the left lower lobe  (image 121; series  3). There is a 6 mm left lower lobe nodule (image 79; series 3). There is a 5 mm nodule in the lingula (image 55; series 3). There is a 3 mm nodule within the left upper lobe (image 41; series 3). There is a large right pleural effusion with near total collapse of the right lung. There is minimal aeration of the right upper lobe. Multiple enhancing predominantly pleural-based nodules are demonstrated throughout the right hemithorax. There is a more dominant pleural-based nodule demonstrated within the anterior right upper hemithorax which measures up to 7 cm. Upper Abdomen: Unremarkable. Musculoskeletal: Thoracic spine degenerative changes. No aggressive or acute appearing osseous lesions. IMPRESSION: 1. Multiple enhancing pleural-based nodules are demonstrated throughout the right hemithorax with large right pleural effusion most concerning for pleural-based malignancy/metastatic disease. There is a more dominant pleural-based mass within the anterior right hemithorax. 2. Multiple enlarged mediastinal lymph nodes concerning for nodal metastatic disease. 3. Multiple nodules within the left lung concerning for the possibility of pulmonary metastatic disease. 4. These results were called by telephone at the time of interpretation on 02/21/2018 at 5:58 pm. Electronically Signed   By: Lovey Newcomer M.D.   On: 02/21/2018 18:00   Ct Angio Chest Pe W Or Wo Contrast  Result Date: 03/12/2018 CLINICAL DATA:  Increasing shortness of breath. EXAM: CT ANGIOGRAPHY CHEST WITH CONTRAST TECHNIQUE: Multidetector CT imaging of the chest was performed using the standard protocol during bolus administration of intravenous contrast. Multiplanar CT image reconstructions and MIPs were obtained to evaluate the vascular anatomy. CONTRAST:  9m ISOVUE-370 IOPAMIDOL (ISOVUE-370) INJECTION 76% COMPARISON:  PET-CT dated 03/01/2018 and chest CT dated 02/21/2018 FINDINGS: Cardiovascular: There is due shift of the heart and  other mediastinal structures to the left by the rapidly expanding extensive tumor in the right hemithorax. There are no pulmonary emboli. There is slight compression of the right main pulmonary artery back extensive mediastinal and hilar adenopathy. Port-A-Cath tip at the cavoatrial junction. Mediastinum/Nodes: Rapid increased extensive right hilar and mediastinal adenopathy. Rapid expansion of subcarinal adenopathy and adenopathy adjacent to the distal esophagus. Lungs/Pleura: There has been quite rapid growth of the extensive tumor in the right hemithorax as well as expansion of a cystic area in the right midzone with further compressive atelectasis of the remaining portion of the right lung. There is a small right chest tube in place. No significant residual right effusion. Left effusion has resolved since the prior study. Small new focal areas of patchy infiltrate in the anterior aspect of the left upper lobe. Upper Abdomen: There is extensive tumor along the superior surface of the right hemidiaphragm depressing the right hemidiaphragm and the right lobe of the liver. Musculoskeletal: No chest wall abnormality. No acute or significant osseous findings. Review of the MIP images confirms the above findings. IMPRESSION: 1. Very rapid increase in extensive tumor in the right hemithorax since the PET-CT scan done 11 days ago. 2. Shift of the heart and mediastinal structures to the left with depression of the right hemidiaphragm due to the extensive tumor in the right hemithorax and in the mediastinum. 3. No pulmonary emboli. 4. Further compression of the right lung by tumor. 5. Expansion of loculated fluid in the right midzone. 6. Resolution of left pleural effusion. Critical Value/emergent results were called by telephone at the time of interpretation on 03/12/2018 at 12:11 pm to [place] NP, who verbally acknowledged these results. Electronically Signed   By: JLorriane ShireM.D.   On: 03/12/2018 12:19   Mr  Brain W Wo Contrast  Result Date: 02/24/2018 CLINICAL DATA:  New diagnosis lung carcinoma. EXAM: MRI HEAD WITHOUT AND WITH CONTRAST TECHNIQUE: Multiplanar, multiecho pulse sequences of the brain and surrounding structures were obtained without and with intravenous contrast. CONTRAST:  22m MULTIHANCE GADOBENATE DIMEGLUMINE 529 MG/ML IV SOLN COMPARISON:  None. FINDINGS: Brain: Diffusion imaging does not show any acute or subacute infarction. Brainstem is normal. 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 There is old infarction in the deep insula and right posterior frontal brain. Mild chronic small-vessel changes seen elsewhere within the hemispheric white matter. There is no evidence of metastatic disease. No hemorrhage, hydrocephalus or extra-axial collection. Vascular: Major vessels at the base of the brain show flow. Skull and upper cervical spine: Negative Sinuses/Orbits: Clear/normal Other: None IMPRESSION: No evidence of metastatic disease.  Old ischemic changes as above. Electronically Signed   By: MNelson ChimesM.D.   On: 02/24/2018 11:58   Nm Pet Image Initial (pi) Skull Base To Thigh  Result Date: 03/01/2018 CLINICAL DATA:  Initial treatment strategy for right lung carcinoma. EXAM: NUCLEAR MEDICINE PET SKULL BASE TO THIGH TECHNIQUE: 9.6 mCi F-18 FDG was injected intravenously. Full-ring PET imaging was performed from the skull base to thigh after the radiotracer. CT data was obtained and used for attenuation correction and anatomic localization. Fasting blood glucose: 86 mg/dl COMPARISON:  Chest CT on 02/21/2018 FINDINGS: (Mediastinal blood pool activity: SUV max = 1.7) NECK:  No hypermetabolic lymph nodes or masses. Incidental CT findings:  None. CHEST: No hypermetabolic lymphadenopathy. Right pleural effusion is decreased in size since previous study and right pleural drain is seen in place. Large masslike opacity in the anterior inferior right upper lobe is seen which abuts the anterior  chest wall and measures approximately 12.0 x 6.5 cm on image 87/3. This has SUV max of 12.0, and is suspicious for primary bronchogenic carcinoma. Bulky hypermetabolic soft tissue density is seen throughout the right pleural space, consistent with pleural metastatic disease. Small left pleural effusion is seen, without FDG uptake. No suspicious left lung nodules or masses identified. Incidental CT findings:  None. ABDOMEN/PELVIS: No abnormal hypermetabolic activity within the liver, pancreas, adrenal glands, or spleen. No hypermetabolic lymph nodes in the abdomen or pelvis. Incidental CT findings:  Mild ascites seen, without FDG uptake. SKELETON: No focal hypermetabolic bone lesions to suggest skeletal metastasis. Incidental CT findings:  None. IMPRESSION: Large hypermetabolic masslike opacity in the anterior right upper lobe, suspicious for primary bronchogenic carcinoma. Bulky hypermetabolic soft tissue density throughout the right pleural space, consistent with metastatic disease. No evidence of metastatic disease within the neck, abdomen, or pelvis. Electronically Signed   By: JEarle GellM.D.   On: 03/01/2018 11:28   Dg Chest Port 1 View  Result Date: 02/26/2018 CLINICAL DATA:  Chest tube in place.  Lung cancer. EXAM: PORTABLE CHEST 1 VIEW COMPARISON:  One-view chest x-ray 02/23/2018 FINDINGS: Heart size is normal. A right IJ Port-A-Cath is stable. Two right-sided chest tubes are stable in position. Interstitial and airspace disease is increasing on the right. A right pleural effusion is suspected. The left lung is clear. The visualized soft tissues and bony thorax are unremarkable. No residual pneumothorax is present. IMPRESSION: 1. Increasing right pleural effusion and airspace disease. 2. PleurX catheter and right-sided chest tube stable in position. 3. Right IJ catheter stable. Electronically Signed   By: CSan MorelleM.D.   On: 02/26/2018 07:17   Dg Chest Port 1 View  Result Date:  02/23/2018 CLINICAL  DATA:  Postoperative chest x-ray.  Tube placements. EXAM: PORTABLE CHEST 1 VIEW COMPARISON:  02/22/2018. FINDINGS: PowerPort catheter with tip over cavoatrial junction. Two right chest tubes noted with tip over the right upper chest. Significant reduction in right-sided pleural effusion with mild residual. No pneumothorax. Atelectatic changes are noted in the right upper and lower lungs. Follow-up exams to demonstrate resolution suggested. Heart size normal. No acute bony abnormality. IMPRESSION: 1.  PowerPort catheter noted with tip at the cavoatrial junction. 2. Two right chest tubes noted. Significant reduction in right-sided pleural effusion with mild residual. Atelectatic changes noted the right upper and lower lungs. Follow-up exams demonstrate resolution suggested. Electronically Signed   By: Marcello Moores  Register   On: 02/23/2018 16:10   Dg Chest Port 1 View  Result Date: 02/22/2018 CLINICAL DATA:  Post right-sided thoracentesis EXAM: PORTABLE CHEST 1 VIEW COMPARISON:  02/21/2018; chest CT-02/21/2018 FINDINGS: Interval reduction in persistent moderate-sized right-sided effusion post thoracentesis. No pneumothorax. Slight reduction in previously noted right to left mediastinal shift. Otherwise, unchanged cardiac silhouette and mediastinal contours with persistent obscuration of the right heart border secondary to right basilar consolidative opacities. Post PFO ligation. No new focal airspace opacities. The left hemithorax remains well aerated. No acute osseus abnormalities. IMPRESSION: Interval reduction in persistent moderate-sized right-sided effusion post thoracentesis. No pneumothorax. Electronically Signed   By: Sandi Mariscal M.D.   On: 02/22/2018 10:41   Dg C-arm 1-60 Min-no Report  Result Date: 02/23/2018 Fluoroscopy was utilized by the requesting physician.  No radiographic interpretation.   US Thoracentesis Asp Pleural Space W/img Guide  Result Date: 02/22/2018 INDICATION: No  known primary, now with multiple pleural based masses and symptomatic right-sided pleural effusion. Please perform ultrasound-guided thoracentesis for diagnostic and therapeutic purposes. EXAM: US THORACENTESIS ASP PLEURAL SPACE W/IMG GUIDE COMPARISON:  Chest radiograph - 02/21/2018; chest CT - 02/21/2018 MEDICATIONS: None. COMPLICATIONS: None immediate. TECHNIQUE: Informed written consent was obtained from the patient after a discussion of the risks, benefits and alternatives to treatment. A timeout was performed prior to the initiation of the procedure. Initial ultrasound scanning demonstrates a large anechoic right-sided pleural effusion. The lower chest was prepped and draped in the usual sterile fashion. 1% lidocaine was used for local anesthesia. An ultrasound image was saved for documentation purposes. An 8 Fr Safe-T-Centesis catheter was introduced. The thoracentesis was performed. The catheter was removed and a dressing was applied. The patient tolerated the procedure well without immediate post procedural complication. The patient was escorted to have an upright chest radiograph. FINDINGS: A total of approximately 2 liters of serous, slightly blood tinged fluid was removed. Requested samples were sent to the laboratory. IMPRESSION: Successful ultrasound-guided right sided thoracentesis yielding 2 liters of pleural fluid. Electronically Signed   By: Sandi Mariscal M.D.   On: 02/22/2018 11:06     Assessment and plan Patient is a 47 y.o. male with history of stroke, PFO, hypertension, newly diagnosed lung cancer present to discuss about pathology results and future treatment plans. 1. Primary cancer of right lower lobe of lung ([place])   2. Goals of care, counseling/discussion   3. SOB (shortness of breath)   4. 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  Image showed a very rapid increase in extensive tumor in  the right hemithorax, shift of the heart and mediastinal structures to the left with depression of the right hemi-diaphragm due to the extensive tumor in the right hemithorax and in the mediastinum.  No PE.  Further compression of the right lumbar trouble.  Inspection of loculated fluid in the right mid zone.  Resolution of left pleural effusion.    I explained to the patient the risks and benefits of chemotherapy including all but not limited to hair loss, mouth sore, nausea, vomiting, low blood counts, bleeding, and risk of life threatening infection and even death, secondary malignancy etc.  Risk of neuropathy is associated with Taxol . Patient voices understanding and willing to proceed chemotherapy. He has been to chemotherapy education class.   # Antiemetics-Zofran and Compazine have been sent to Pharmacy.  # Goal of care: Discussed with patient that his disease is not curable.  Treatment is with palliative intent. Awaiting for results of NGS and PDL 1.   #SOB: Due to lung cancer.  At the infusion center, he was noticed to have resting oxygen saturation at 85%.  Started on nasal cannula oxygen at the infusion center.  Will start paperwork for him to get home oxygen. Continue morphine concentrate 20 mg every 4 hours as needed.  He has lorazepam 0.5 mg every 8 hours as needed for anxiety. #Patient has been referred to radiation oncologist Dr. Baruch Gouty, will started on treatment next week.  #Follow-up in 1 week with Dr. B to assess tolerability of chemo and respiratory status.  Patient knows to call if breathing gets worse.  Total face to face encounter time for this patient visit was 40 min. >50% of the time was  spent in counseling and coordination of care.  Addendum:  Omniseq test came back today.  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 Dr.Brahmanday to discuss with patient.  Called patient and he is aware about his NGS result.   Earlie Server, MD, PhD Hematology Oncology [facility] Pager- [phone] 03/13/2018

## 2018-03-14 ENCOUNTER — Ambulatory Visit: Payer: Self-pay | Admitting: Physician Assistant

## 2018-03-14 ENCOUNTER — Ambulatory Visit
Admission: RE | Admit: 2018-03-14 | Discharge: 2018-03-14 | Disposition: A | Discharge status: Home / Self Care | Payer: No Typology Code available for payment source | Source: Ambulatory Visit | Attending: Radiation Oncology | Admitting: Radiation Oncology

## 2018-03-14 DIAGNOSIS — Z87891 Personal history of nicotine dependence: Secondary | ICD-10-CM | POA: Diagnosis not present

## 2018-03-14 DIAGNOSIS — C3431 Malignant neoplasm of lower lobe, right bronchus or lung: Secondary | ICD-10-CM | POA: Insufficient documentation

## 2018-03-14 DIAGNOSIS — J910 Malignant pleural effusion: Secondary | ICD-10-CM | POA: Diagnosis not present

## 2018-03-15 ENCOUNTER — Encounter: Payer: Self-pay | Admitting: Oncology

## 2018-03-15 ENCOUNTER — Ambulatory Visit
Admission: RE | Admit: 2018-03-15 | Discharge: 2018-03-15 | Disposition: A | Discharge status: Home / Self Care | Payer: No Typology Code available for payment source | Source: Ambulatory Visit | Attending: Nurse Practitioner | Admitting: Nurse Practitioner

## 2018-03-15 DIAGNOSIS — I493 Ventricular premature depolarization: Secondary | ICD-10-CM | POA: Diagnosis not present

## 2018-03-16 ENCOUNTER — Encounter: Payer: Self-pay | Admitting: Oncology

## 2018-03-18 ENCOUNTER — Telehealth: Payer: Self-pay | Admitting: Internal Medicine

## 2018-03-18 MED ORDER — OSIMERTINIB MESYLATE 80 MG PO TABS: 80.0000 mg | ORAL_TABLET | Freq: Every day | ORAL | 5 refills | Status: DC

## 2018-03-18 NOTE — Telephone Encounter (Signed)
As per records, pt noted to have EGFR del 19 [NGS not available on EPIC]; will plan to start on Osimertinib 80 mg/day; printed. Recent echo- June 3rd 2019- EF- 55-60%.  Pt has appt with me on June 24th; will discuss further.

## 2018-03-19 ENCOUNTER — Encounter: Payer: Self-pay | Admitting: Internal Medicine

## 2018-03-19 ENCOUNTER — Other Ambulatory Visit: Payer: Self-pay

## 2018-03-19 ENCOUNTER — Telehealth: Payer: Self-pay | Admitting: Pharmacy Technician

## 2018-03-19 ENCOUNTER — Inpatient Hospital Stay (HOSPITAL_BASED_OUTPATIENT_CLINIC_OR_DEPARTMENT_OTHER): Payer: No Typology Code available for payment source | Admitting: Internal Medicine

## 2018-03-19 ENCOUNTER — Telehealth: Payer: Self-pay | Admitting: Pharmacist

## 2018-03-19 ENCOUNTER — Ambulatory Visit: Payer: No Typology Code available for payment source

## 2018-03-19 ENCOUNTER — Inpatient Hospital Stay: Payer: No Typology Code available for payment source

## 2018-03-19 VITALS — BP 122/74 | HR 99 | Temp 97.3°F | Resp 16 | Wt 172.0 lb

## 2018-03-19 DIAGNOSIS — R630 Anorexia: Secondary | ICD-10-CM | POA: Diagnosis not present

## 2018-03-19 DIAGNOSIS — J910 Malignant pleural effusion: Secondary | ICD-10-CM

## 2018-03-19 DIAGNOSIS — Z7189 Other specified counseling: Secondary | ICD-10-CM

## 2018-03-19 DIAGNOSIS — J189 Pneumonia, unspecified organism: Secondary | ICD-10-CM

## 2018-03-19 DIAGNOSIS — R600 Localized edema: Secondary | ICD-10-CM

## 2018-03-19 DIAGNOSIS — Z79899 Other long term (current) drug therapy: Secondary | ICD-10-CM

## 2018-03-19 DIAGNOSIS — R5381 Other malaise: Secondary | ICD-10-CM | POA: Diagnosis not present

## 2018-03-19 DIAGNOSIS — Q211 Atrial septal defect: Secondary | ICD-10-CM

## 2018-03-19 DIAGNOSIS — R590 Localized enlarged lymph nodes: Secondary | ICD-10-CM | POA: Diagnosis not present

## 2018-03-19 DIAGNOSIS — R9389 Abnormal findings on diagnostic imaging of other specified body structures: Secondary | ICD-10-CM | POA: Diagnosis not present

## 2018-03-19 DIAGNOSIS — C3431 Malignant neoplasm of lower lobe, right bronchus or lung: Secondary | ICD-10-CM | POA: Diagnosis not present

## 2018-03-19 DIAGNOSIS — R0602 Shortness of breath: Secondary | ICD-10-CM | POA: Diagnosis not present

## 2018-03-19 DIAGNOSIS — Z8673 Personal history of transient ischemic attack (TIA), and cerebral infarction without residual deficits: Secondary | ICD-10-CM

## 2018-03-19 DIAGNOSIS — Z9989 Dependence on other enabling machines and devices: Secondary | ICD-10-CM

## 2018-03-19 DIAGNOSIS — I10 Essential (primary) hypertension: Secondary | ICD-10-CM

## 2018-03-19 DIAGNOSIS — Z9981 Dependence on supplemental oxygen: Secondary | ICD-10-CM

## 2018-03-19 DIAGNOSIS — R9431 Abnormal electrocardiogram [ECG] [EKG]: Secondary | ICD-10-CM

## 2018-03-19 DIAGNOSIS — F419 Anxiety disorder, unspecified: Secondary | ICD-10-CM

## 2018-03-19 DIAGNOSIS — Z87891 Personal history of nicotine dependence: Secondary | ICD-10-CM

## 2018-03-19 DIAGNOSIS — G4733 Obstructive sleep apnea (adult) (pediatric): Secondary | ICD-10-CM

## 2018-03-19 DIAGNOSIS — G4700 Insomnia, unspecified: Secondary | ICD-10-CM

## 2018-03-19 DIAGNOSIS — R5383 Other fatigue: Secondary | ICD-10-CM | POA: Diagnosis not present

## 2018-03-19 DIAGNOSIS — J9600 Acute respiratory failure, unspecified whether with hypoxia or hypercapnia: Secondary | ICD-10-CM

## 2018-03-19 DIAGNOSIS — Z7982 Long term (current) use of aspirin: Secondary | ICD-10-CM

## 2018-03-19 LAB — CBC WITH DIFFERENTIAL/PLATELET
Basophils Absolute: 0 10*3/uL (ref 0–0.1)
Basophils Relative: 1 %
EOS ABS: 0.2 10*3/uL (ref 0–0.7)
EOS PCT: 3 %
HCT: 33.2 % — ABNORMAL LOW (ref 40.0–52.0)
Hemoglobin: 11.5 g/dL — ABNORMAL LOW (ref 13.0–18.0)
LYMPHS ABS: 1.3 10*3/uL (ref 1.0–3.6)
Lymphocytes Relative: 24 %
MCH: 31.2 pg (ref 26.0–34.0)
MCHC: 34.5 g/dL (ref 32.0–36.0)
MCV: 90.4 fL (ref 80.0–100.0)
Monocytes Absolute: 0.3 10*3/uL (ref 0.2–1.0)
Monocytes Relative: 5 %
Neutro Abs: 3.5 10*3/uL (ref 1.4–6.5)
Neutrophils Relative %: 67 %
PLATELETS: 182 10*3/uL (ref 150–440)
RBC: 3.68 MIL/uL — AB (ref 4.40–5.90)
RDW: 12.8 % (ref 11.5–14.5)
WBC: 5.3 10*3/uL (ref 3.8–10.6)

## 2018-03-19 LAB — COMPREHENSIVE METABOLIC PANEL
ALT: 41 U/L (ref 17–63)
AST: 35 U/L (ref 15–41)
Albumin: 2.7 g/dL — ABNORMAL LOW (ref 3.5–5.0)
Alkaline Phosphatase: 45 U/L (ref 38–126)
Anion gap: 9 (ref 5–15)
BILIRUBIN TOTAL: 0.7 mg/dL (ref 0.3–1.2)
BUN: 21 mg/dL — AB (ref 6–20)
CHLORIDE: 99 mmol/L — AB (ref 101–111)
CO2: 23 mmol/L (ref 22–32)
CREATININE: 0.91 mg/dL (ref 0.61–1.24)
Calcium: 9.3 mg/dL (ref 8.9–10.3)
GFR calc Af Amer: >60 mL/min (ref >60)
Glucose, Bld: 119 mg/dL — ABNORMAL HIGH (ref 65–99)
Potassium: 4 mmol/L (ref 3.5–5.1)
Sodium: 131 mmol/L — ABNORMAL LOW (ref 135–145)
Total Protein: 5.3 g/dL — ABNORMAL LOW (ref 6.5–8.1)

## 2018-03-19 MED ORDER — MORPHINE SULFATE ER 15 MG PO TBCR: 15.0000 mg | EXTENDED_RELEASE_TABLET | Freq: Two times a day (BID) | ORAL | 0 refills | Status: DC

## 2018-03-19 MED ORDER — OSIMERTINIB MESYLATE 80 MG PO TABS: 80.0000 mg | ORAL_TABLET | Freq: Every day | ORAL | 5 refills | Status: DC

## 2018-03-19 MED FILL — TAGRISSO 80 MG TABLET: 80 | 30 days supply | Qty: 30 | Fill #0

## 2018-03-19 NOTE — Telephone Encounter (Signed)
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  CMP from 03/12/18 assessed, no relevant lab abnormalities. ECHO from 02/26/18 showed EF of 55-60%. ECG from 03/07/18 showed QTC of 491 and ECG from 02/21/18 showed QTc of 455, recommend repeating ECG prior to starting Tagrisso. Prescription dose and frequency assessed.   Current medication list in Epic reviewed, no relevant DDIs with Tagrisso identified. Levofloxacin is on the patient's medication list but according to the prescription instructions, dates, and quantity, this course of antibiotics should be completed.  Prescription has been e-scribed to the [facility] for benefits analysis and approval.  Oral Oncology Clinic will continue to follow for insurance authorization, copayment issues, initial counseling and start date.  Darl Pikes, PharmD, BCPS Hematology/Oncology Clinical Pharmacist [facility] [place] [facility] [phone]  03/19/2018 8:59 AM

## 2018-03-19 NOTE — Telephone Encounter (Signed)
Oral Oncology Patient Advocate Encounter  Prior Authorization for Ruben Eaton has been approved.    Reference# [id number] Effective dates: 03/19/2018 through 03/19/2019  Benefits Investigation Copay is $250. We are in process of getting Ruben Eaton a copay card from [employer] to reduce copay to $0.  Oral Oncology Clinic will continue to follow.   Ruben Eaton Patient [facility] Phone [phone] Fax [phone] 03/19/2018 3:13 PM

## 2018-03-19 NOTE — Assessment & Plan Note (Addendum)
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  #Given the rapid progression of disease/and the activating mutation noted in the EGFR exon 19-recommend osimertinib 80 mg once a day.  Prescription started.  Discussed that the median duration of response is about 2 years; the response rates are about 70 to 80%.  Patient wants to hold off any radiation at this time.  Discussed with Dr. Donella Stade.  He agrees.  Discussed the potential side effects including but not limited to rash diarrhea cardiac abnormalities recent 2D echo within normal limits.   #Pleurx catheter draining about 20 cc a day.  If continued minimal drainage would recommend discontinuation.  #Pain poorly controlled/difficulty sleeping at night recommend adding mscontin 15 BID; in addition to short-acting pain medication.   #Patient wanted to go back to work at this time-which I think is not feasible given his oxygen dependent status; rapidly progressive disease.  Did counsel the patient that if his clinical response is noted then he could go back to work.  # no labs/july 5th.

## 2018-03-19 NOTE — Progress Notes (Signed)
[facility] CONSULT NOTE  Patient Care Team: Linthavong, Kanhka, MD as PCP - General (Family Medicine) Cooper, Michael, MD as PCP - Cardiology (Cardiology) Rhode, Hayley, RN as Registered Nurse Cooper, Darlene H, RN as [facility] Care Management  CHIEF COMPLAINTS/PURPOSE OF CONSULTATION:  LUNG CANCER  #  Oncology History   # June 2019- LUNG CA- HIGH GRADE CA with sarcomatoid features [EGFR MUTATED; exon 19; clinically non-small cell];II opinion at [facility]  # June 17th- carbo-taxol #1  # HTN/ cryptogenic stroke    DIAGNOSIS: non-small cell lung ca  STAGE:  IV       ;GOALS: palliative  CURRENT/MOST RECENT THERAPY- June 26th OSIMERTINIB       Primary cancer of right lower lobe of lung (HCC)     HISTORY OF PRESENTING ILLNESS:  Ruben Eaton 47 y.o.  male newly diagnosed metastatic lung cancer/non-small cell is here for follow-up.  Patient underwent chemotherapy with carbotaxol approximately 1 week ago; pending NGS results.   Over the weekend NGS results come back positive for exam 19 EGFR mutation.  He was evaluated due for a second opinion this morning.  Patient continues to have progressive shortness of breath and cough.  Poor appetite.  Positive for swelling in the legs.  Difficulty sleeping at night.  Complains of pain at night.  Is currently on short-acting pain medication.  Poor appetite.  Review of Systems  Constitutional: Positive for malaise/fatigue and weight loss. Negative for chills, diaphoresis and fever.  HENT: Negative for nosebleeds and sore throat.   Eyes: Negative for double vision.  Respiratory: Positive for cough and shortness of breath. Negative for hemoptysis, sputum production and wheezing.   Cardiovascular: Positive for leg swelling. Negative for chest pain, palpitations and orthopnea.  Gastrointestinal: Negative for abdominal pain, blood in stool, constipation, diarrhea, heartburn, melena, nausea and vomiting.   Genitourinary: Negative for dysuria, frequency and urgency.  Musculoskeletal: Negative for back pain and joint pain.  Skin: Negative.  Negative for itching and rash.  Neurological: Negative for dizziness, tingling, focal weakness, weakness and headaches.  Endo/Heme/Allergies: Does not bruise/bleed easily.  Psychiatric/Behavioral: Negative for depression. The patient is nervous/anxious and has insomnia.      MEDICAL HISTORY:  Past Medical History:  Diagnosis Date  . Cryptogenic stroke (HCC) 08/2016   a. 09/15/2016 in setting of PFO.  . CVA (cerebral vascular accident) (HCC) 09/07/2016  . ED (erectile dysfunction)   . Essential (primary) hypertension 02/01/2016  . History of stroke 10/08/2016  . Hx of insomnia   . Hypertension   . OSA on CPAP   . PFO (patent foramen ovale)    a. s/p closure 09/2016 by Dr. Cooper.  . Pleural effusion on right 02/21/2018  . Primary cancer of right lower lobe of lung (HCC) 02/25/2018  . PVC's (premature ventricular contractions) 10/08/2016  . 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 TIA (transient ischemic attack) 09/07/2016    SURGICAL HISTORY: Past Surgical History:  Procedure Laterality Date  . APPENDECTOMY  1980  . EYE SURGERY    . PATENT FORAMEN OVALE CLOSURE  10/07/2016  . PATENT FORAMEN OVALE CLOSURE N/A 10/07/2016   Procedure: Patent Forament [place](PFO) Closure;  Surgeon: Michael Cooper, MD;  Location: [facility] INVASIVE CV LAB;  Service: Cardiovascular;  Laterality: N/A;  . PORTACATH PLACEMENT N/A 02/23/2018   Procedure: INSERTION PORT-A-CATH;  Surgeon: Oaks, Timothy, MD;  Location: [facility] ORS;  Service: Thoracic;  Laterality: N/A;  . STRABISMUS SURGERY Left 1984  .   TEE WITHOUT CARDIOVERSION N/A 09/08/2016   Procedure: TRANSESOPHAGEAL ECHOCARDIOGRAM (TEE);  Surgeon: [name], MD;  Location: [facility] ORS;  Service: Cardiovascular;  Laterality: N/A;  . VIDEO ASSISTED THORACOSCOPY (VATS) W/TALC PLEUADESIS Right 02/23/2018   Procedure:  VIDEO ASSISTED THORACOSCOPY (VATS) W/TALC PLEUADESIS;  Surgeon: Nestor Lewandowsky, MD;  Location: [facility] ORS;  Service: Thoracic;  Laterality: Right;  . VITRECTOMY Right 2013    SOCIAL HISTORY: Social History   Socioeconomic History  . Marital status: Married    Spouse name: [name]   . Number of children: Not on file  . Years of education: Not on file  . Highest education level: Not on file  Occupational History  . Occupation: Software engineer  Social Needs  . Financial resource strain: Not on file  . Food insecurity:    Worry: Not on file    Inability: Not on file  . Transportation needs:    Medical: Not on file    Non-medical: Not on file  Tobacco Use  . Smoking status: Former Smoker    Packs/day: 0.50    Years: 10.00    Pack years: 5.00    Last attempt to quit: 2011    Years since quitting: 8.4  . Smokeless tobacco: Former Systems developer    Types: Snuff  Substance and Sexual Activity  . Alcohol use: Yes    Alcohol/week: 1.8 oz    Types: 3 Cans of beer per week  . Drug use: No  . Sexual activity: Yes  Lifestyle  . Physical activity:    Days per week: Not on file    Minutes per session: Not on file  . Stress: Not on file  Relationships  . Social connections:    Talks on phone: Not on file    Gets together: Not on file    Attends religious service: Not on file    Active member of club or organization: Not on file    Attends meetings of clubs or organizations: Not on file    Relationship status: Not on file  . Intimate partner violence:    Fear of current or ex partner: Not on file    Emotionally abused: Not on file    Physically abused: Not on file    Forced sexual activity: Not on file  Other Topics Concern  . Not on file  Social History Narrative  . Not on file    FAMILY HISTORY: Family History  Problem Relation Age of Onset  . Other Paternal Uncle        Abdominal tumor     ALLERGIES:  has No Known Allergies.  MEDICATIONS:  No current facility-administered  medications for this visit.    No current outpatient medications on file.   Facility-Administered Medications Ordered in Other Visits  Medication Dose Route Frequency Provider Last Rate Last Dose  . 0.9 %  sodium chloride infusion   Intravenous PRN Patrecia Pour, MD   Stopped at 03/23/18 1859  . acetaminophen (TYLENOL) tablet 650 mg  650 mg Oral Q6H PRN Patrecia Pour, MD   650 mg at 03/24/18 2008   Or  . acetaminophen (TYLENOL) suppository 650 mg  650 mg Rectal Q6H PRN Patrecia Pour, MD      . albuterol (PROVENTIL) (2.5 MG/3ML) 0.083% nebulizer solution 2.5 mg  2.5 mg Nebulization Q2H PRN Debbe Odea, MD   2.5 mg at 03/23/18 1310  . aspirin EC tablet 81 mg  81 mg Oral Daily Patrecia Pour, MD   [phone]  mg at 03/25/18 0902  . Chlorhexidine Gluconate Cloth 2 % PADS 6 each  6 each Topical Daily Rizwan, Saima, MD      . desonide (DESOWEN) 0.05 % ointment   Topical BID Rizwan, Saima, MD      . dextromethorphan-guaiFENesin (MUCINEX DM) 30-600 MG per 12 hr tablet 1 tablet  1 tablet Oral BID Schorr, Rhetta Mura, NP   1 tablet at 03/25/18 0902  . enoxaparin (LOVENOX) injection 40 mg  40 mg Subcutaneous Daily Debbe Odea, MD   40 mg at 03/25/18 0900  . feeding supplement (PRO-STAT SUGAR FREE 64) liquid 30 mL  30 mL Oral BID Debbe Odea, MD   30 mL at 03/25/18 0922  . ipratropium-albuterol (DUONEB) 0.5-2.5 (3) MG/3ML nebulizer solution 3 mL  3 mL Nebulization Q6H PRN Debbe Odea, MD      . MEDLINE mouth rinse  15 mL Mouth Rinse BID Patrecia Pour, MD   15 mL at 03/25/18 0924  . metoprolol tartrate (LOPRESSOR) tablet 25 mg  25 mg Oral BID Patrecia Pour, MD   25 mg at 03/25/18 0900  . morphine CONCENTRATE 10 MG/0.5ML oral solution 10 mg  10 mg Oral Q2H PRN Berton Mount, RPH      . multivitamin with minerals tablet 1 tablet  1 tablet Oral Daily Patrecia Pour, MD   1 tablet at 03/25/18 0902  . ondansetron (ZOFRAN) tablet 4 mg  4 mg Oral Q6H PRN Patrecia Pour, MD       Or  . ondansetron [facility])  injection 4 mg  4 mg Intravenous Q6H PRN Patrecia Pour, MD      . osimertinib mesylate (TAGRISSO) tablet 80 mg  80 mg Oral Daily Patrecia Pour, MD   80 mg at 03/25/18 0910  . oxyCODONE (Oxy IR/ROXICODONE) immediate release tablet 5 mg  5 mg Oral Q6H PRN Debbe Odea, MD      . piperacillin-tazobactam (ZOSYN) IVPB 3.375 g  3.375 g Intravenous Q8H Debbe Odea, MD   Stopped at 03/25/18 1352  . polyethylene glycol (MIRALAX / GLYCOLAX) packet 17 g  17 g Oral Daily Debbe Odea, MD   17 g at 03/22/18 0925  . prochlorperazine (COMPAZINE) tablet 10 mg  10 mg Oral Q6H PRN Patrecia Pour, MD      . senna-docusate (Senokot-S) tablet 1 tablet  1 tablet Oral Daily Patrecia Pour, MD   1 tablet at 03/22/18 [phone]      .  PHYSICAL EXAMINATION: ECOG PERFORMANCE STATUS: 2 - Symptomatic, <50% confined to bed  Vitals:   03/19/18 1453 03/19/18 1458  BP: 122/74   Pulse: 99   Resp: 16   Temp: (!) 97.3 F (36.3 C)   SpO2:  92%   Filed Weights   03/19/18 1453  Weight: 172 lb (78 kg)    GENERAL: Well-nourished well-developed; Alert, no distress and comfortable.  Accompanied by family.  She is a wheelchair. EYES: no pallor or icterus OROPHARYNX: no thrush or ulceration; NECK: supple; no lymph nodes felt. LYMPH:  no palpable lymphadenopathy in the axillary or inguinal regions LUNGS: Decreased breath sounds auscultation bilaterally. No wheeze or crackles HEART/CVS: regular rate & rhythm and no murmurs; bilateral 2+ lower extremity edema ABDOMEN:abdomen soft, non-tender and normal bowel sounds. No hepatomegaly or splenomegaly.  Musculoskeletal:no cyanosis of digits and no clubbing  PSYCH: alert & oriented x 3 with fluent speech NEURO: no focal motor/sensory deficits SKIN:  no rashes or significant lesions  LABORATORY  DATA:  I have reviewed the data as listed Lab Results  Component Value Date   WBC 5.8 03/23/2018   HGB 11.0 (L) 03/23/2018   HCT 32.3 (L) 03/23/2018   MCV 89.5 03/23/2018   PLT 195  03/23/2018   Recent Labs    03/19/18 1425  03/20/18 2100  03/23/18 0620 03/24/18 1024 03/25/18 0729  NA 131*   < >  --    < > 137 140 141  K 4.0   < >  --    < > 3.8 3.8 3.5  CL 99*   < >  --    < > 102 105 103  CO2 23   < >  --    < > 29 29 31  GLUCOSE 119*   < >  --    < > 106* 117* 108*  BUN 21*   < >  --    < > 26* 22* 25*  CREATININE 0.91   < >  --    < > 1.05 1.04 1.18  CALCIUM 9.3   < >  --    < > 9.7 9.9 9.9  GFRNONAA >60   < >  --    < > >60 >60 >60  GFRAA >60   < >  --    < > >60 >60 >60  PROT 5.3*  --  5.3*  --  4.8*  --   --   ALBUMIN 2.7*  --  2.7*  --  2.4*  --   --   AST 35  --  35  --  24  --   --   ALT 41  --  40  --  32  --   --   ALKPHOS 45  --  44  --  40  --   --   BILITOT 0.7  --  0.5  --  0.6  --   --   BILIDIR  --   --  <0.1  --   --   --   --   IBILI  --   --  NOT CALCULATED  --   --   --   --    < > = values in this interval not displayed.    RADIOGRAPHIC STUDIES: I have personally reviewed the radiological images as listed and agreed with the findings in the report. Dg Chest 2 View  Result Date: 03/12/2018 CLINICAL DATA:  RIGHT-side pleural effusion, shortness of breath RIGHT lower lobe lung cancer, former smoker EXAM: CHEST - 2 VIEW COMPARISON:  03/07/2018 FINDINGS: RIGHT jugular Port-A-Cath with tip projecting over SVC. RIGHT thoracostomy tube. Stable heart size. Loculated RIGHT pleural effusion and nodularity throughout the RIGHT hemithorax from known malignant effusion. Mediastinal shift RIGHT to LEFT increased. LEFT lung clear. No LEFT pneumothorax identified. Osseous structures unremarkable. IMPRESSION: Known RIGHT pleural carcinomatosis and malignant effusion with increased RIGHT pleural opacity and RIGHT lung atelectasis since 03/07/2018. Increased RIGHT to LEFT midline shift. Electronically Signed   By: Mark  Boles M.D.   On: 03/12/2018 11:35   Dg Chest 2 View  Result Date: 03/07/2018 CLINICAL DATA:  Lung carcinoma.  Cough. EXAM: CHEST - 2  VIEW COMPARISON:  March 05, 2018 FINDINGS: Right pleural drain is again noted with the tip at the right apex level. Port-A-Cath tip is in the superior vena cava. No pneumothorax. There are areas of apparent loculated effusion as well as consolidation throughout the right lung. The left lung is clear. Heart   size and pulmonary vascularity are normal. IMPRESSION: No appreciable change compared to most recent chest radiograph. Areas of loculated effusion as well as consolidation and probable underlying mases on the right with opacification of much of the right lung, stable. Left lung clear. Stable cardiac silhouette. Electronically Signed   By: William  Woodruff III M.D.   On: 03/07/2018 10:10   Dg Chest 2 View  Result Date: 03/05/2018 CLINICAL DATA:  47-year-old male with shortness of breath for 2 weeks. Right lung cancer diagnosed in the past several weeks. EXAM: CHEST - 2 VIEW COMPARISON:  PET-CT 03/01/2018 and earlier. FINDINGS: Right chest IJ approach power port appear stable, not currently accessed. Superimposed right pleural drain. Regressed large right pleural effusion and patchy improved right lung ventilation since the radiographs on 02/21/2018. Residual loculated pleural fluid and/or pleural thickening. Associated thickening of the right paratracheal stripe and hilum. Left mediastinal contours remain normal. A cardiac interatrial occluded device is re-demonstrated. Small left pleural effusion is stable since 03/01/2018, otherwise the left lung is clear. Stable visualized osseous structures. Negative visible bowel gas pattern. IMPRESSION: 1. Right pleural drain in place with regressed right pleural effusion and patchy improved right lung ventilation since 02/21/2018. Residual loculated pleural fluid and/or bulky pleural thickening. 2. Stable small layering left pleural effusion. 3. No new cardiopulmonary abnormality identified. Electronically Signed   By: H  Hall M.D.   On: 03/05/2018 15:27   Ct Angio  Chest Pe W Or Wo Contrast  Result Date: 03/21/2018 CLINICAL DATA:  47-year-old male with shortness of breath. Patient receiving chemotherapy for right lower lobe lung cancer. EXAM: CT ANGIOGRAPHY CHEST WITH CONTRAST TECHNIQUE: Multidetector CT imaging of the chest was performed using the standard protocol during bolus administration of intravenous contrast. Multiplanar CT image reconstructions and MIPs were obtained to evaluate the vascular anatomy. CONTRAST:  72mL ISOVUE-370 IOPAMIDOL (ISOVUE-370) INJECTION 76% COMPARISON:  None. FINDINGS: Cardiovascular: There is no cardiomegaly or pericardial effusion. An Amplatzer occlusive device of patent foramen ovale. The thoracic aorta is unremarkable. The origins of the great vessels of the aortic arch are patent. Evaluation of the pulmonary arteries is limited due to suboptimal enhancement of the peripheral branches. No large or central pulmonary artery embolus identified. Mediastinum/Nodes: There is no adenopathy in the region of the left hilum. Evaluation of the right hilum is limited due to consolidative changes of the right lung. There is shift of the mediastinum into the left hemithorax. Mildly enlarged anterior mediastinal lymph nodes measure up to 15 mm in short axis. A Port-A-Cath is noted with tip in the region of the cavoatrial junction. Lungs/Pleura: There is a large somewhat loculated and complex right pleural effusion and pleural based masses. There is compressive atelectasis and collapse of the right lung. There is only a small aerated portion of the right upper lobe. There are ill-defined hypodense masses in the right chest involving the right lung or pleura which are suboptimally visualized and poorly evaluated. There is mass effect and shift of the mediastinum into the left hemithorax. There is extension of fluid or pleural masses posterior to the mediastinum. A right-sided pleural base tube is seen with tip in the right apical region. There is mass  effect and narrowing of the right upper lobe pulmonary artery and high-grade narrowing and complete occlusion of the right mainstem bronchus. Diffuse confluent ground-glass nodular densities in the left lung primarily involving the left lower lobe and lingula most consistent with pneumonia although metastatic disease is not excluded. There is no pneumothorax. Upper   Abdomen: No acute abnormality. Musculoskeletal: No chest wall abnormality. No acute or significant osseous findings. Review of the MIP images confirms the above findings. IMPRESSION: 1. No CT evidence of pulmonary embolism. 2. Large complex pleural effusion and pleural based masses with near complete collapse of the right lung and associated mass effect and shift of the mediastinum into the left hemithorax. There is occlusion of the right mainstem bronchus secondary to mass effect and extrinsic compression. 3. Left lower lobe and lingula airspace densities most consistent with pneumonia. Clinical correlation recommended. Electronically Signed   By: Anner Crete M.D.   On: 03/21/2018 04:46   Ct Angio Chest Pe W Or Wo Contrast  Result Date: 03/12/2018 CLINICAL DATA:  Increasing shortness of breath. EXAM: CT ANGIOGRAPHY CHEST WITH CONTRAST TECHNIQUE: Multidetector CT imaging of the chest was performed using the standard protocol during bolus administration of intravenous contrast. Multiplanar CT image reconstructions and MIPs were obtained to evaluate the vascular anatomy. CONTRAST:  25m ISOVUE-370 IOPAMIDOL (ISOVUE-370) INJECTION 76% COMPARISON:  PET-CT dated 03/01/2018 and chest CT dated 02/21/2018 FINDINGS: Cardiovascular: There is due shift of the heart and other mediastinal structures to the left by the rapidly expanding extensive tumor in the right hemithorax. There are no pulmonary emboli. There is slight compression of the right main pulmonary artery back extensive mediastinal and hilar adenopathy. Port-A-Cath tip at the cavoatrial junction.  Mediastinum/Nodes: Rapid increased extensive right hilar and mediastinal adenopathy. Rapid expansion of subcarinal adenopathy and adenopathy adjacent to the distal esophagus. Lungs/Pleura: There has been quite rapid growth of the extensive tumor in the right hemithorax as well as expansion of a cystic area in the right midzone with further compressive atelectasis of the remaining portion of the right lung. There is a small right chest tube in place. No significant residual right effusion. Left effusion has resolved since the prior study. Small new focal areas of patchy infiltrate in the anterior aspect of the left upper lobe. Upper Abdomen: There is extensive tumor along the superior surface of the right hemidiaphragm depressing the right hemidiaphragm and the right lobe of the liver. Musculoskeletal: No chest wall abnormality. No acute or significant osseous findings. Review of the MIP images confirms the above findings. IMPRESSION: 1. Very rapid increase in extensive tumor in the right hemithorax since the PET-CT scan done 11 days ago. 2. Shift of the heart and mediastinal structures to the left with depression of the right hemidiaphragm due to the extensive tumor in the right hemithorax and in the mediastinum. 3. No pulmonary emboli. 4. Further compression of the right lung by tumor. 5. Expansion of loculated fluid in the right midzone. 6. Resolution of left pleural effusion. Critical Value/emergent results were called by telephone at the time of interpretation on 03/12/2018 at 12:11 pm to [place] NP, who verbally acknowledged these results. Electronically Signed   By: JLorriane ShireM.D.   On: 03/12/2018 12:19   Mr BJeri CosWZOContrast  Result Date: 02/24/2018 CLINICAL DATA:  New diagnosis lung carcinoma. EXAM: MRI HEAD WITHOUT AND WITH CONTRAST TECHNIQUE: Multiplanar, multiecho pulse sequences of the brain and surrounding structures were obtained without and with intravenous contrast. CONTRAST:  129m MULTIHANCE GADOBENATE DIMEGLUMINE 529 MG/ML IV SOLN COMPARISON:  None. FINDINGS: Brain: Diffusion imaging does not show any acute or subacute infarction. Brainstem is normal. 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 There is old infarction in the deep insula and right posterior frontal brain. Mild chronic small-vessel changes seen elsewhere within the hemispheric white  matter. There is no evidence of metastatic disease. No hemorrhage, hydrocephalus or extra-axial collection. Vascular: Major vessels at the base of the brain show flow. Skull and upper cervical spine: Negative Sinuses/Orbits: Clear/normal Other: None IMPRESSION: No evidence of metastatic disease.  Old ischemic changes as above. Electronically Signed   By: Nelson Chimes M.D.   On: 02/24/2018 11:58   Nm Pet Image Initial (pi) Skull Base To Thigh  Result Date: 03/01/2018 CLINICAL DATA:  Initial treatment strategy for right lung carcinoma. EXAM: NUCLEAR MEDICINE PET SKULL BASE TO THIGH TECHNIQUE: 9.6 mCi F-18 FDG was injected intravenously. Full-ring PET imaging was performed from the skull base to thigh after the radiotracer. CT data was obtained and used for attenuation correction and anatomic localization. Fasting blood glucose: 86 mg/dl COMPARISON:  Chest CT on 02/21/2018 FINDINGS: (Mediastinal blood pool activity: SUV max = 1.7) NECK:  No hypermetabolic lymph nodes or masses. Incidental CT findings:  None. CHEST: No hypermetabolic lymphadenopathy. Right pleural effusion is decreased in size since previous study and right pleural drain is seen in place. Large masslike opacity in the anterior inferior right upper lobe is seen which abuts the anterior chest wall and measures approximately 12.0 x 6.5 cm on image 87/3. This has SUV max of 12.0, and is suspicious for primary bronchogenic carcinoma. Bulky hypermetabolic soft tissue density is seen throughout the right pleural space, consistent with pleural metastatic disease. Small left pleural  effusion is seen, without FDG uptake. No suspicious left lung nodules or masses identified. Incidental CT findings:  None. ABDOMEN/PELVIS: No abnormal hypermetabolic activity within the liver, pancreas, adrenal glands, or spleen. No hypermetabolic lymph nodes in the abdomen or pelvis. Incidental CT findings:  Mild ascites seen, without FDG uptake. SKELETON: No focal hypermetabolic bone lesions to suggest skeletal metastasis. Incidental CT findings:  None. IMPRESSION: Large hypermetabolic masslike opacity in the anterior right upper lobe, suspicious for primary bronchogenic carcinoma. Bulky hypermetabolic soft tissue density throughout the right pleural space, consistent with metastatic disease. No evidence of metastatic disease within the neck, abdomen, or pelvis. Electronically Signed   By: Earle Gell M.D.   On: 03/01/2018 11:28   Dg Chest Port 1 View  Result Date: 03/23/2018 CLINICAL DATA:  47 year old male with acute respiratory failure. History of VATS on 02/23/2018 and right lower lobe cancer. EXAM: PORTABLE CHEST 1 VIEW COMPARISON:  None. FINDINGS: Right-sided Port-A-Cath with tip over the mediastinum. There is a large pleural effusion with near complete consolidative changes of the right lung. There is only a small aerated portion of the right lung in the upper lobe. Left lung base densities may represent atelectasis or infiltrate. A small left pleural effusion may be present. There is no pneumothorax. There is silhouetting of the right cardiac border. No acute osseous pathology. IMPRESSION: Near complete opacification of the right hemithorax secondary to large right pleural effusion and associated right lung compressive atelectasis or consolidation. Left lung base atelectasis versus infiltrate. Electronically Signed   By: Anner Crete M.D.   On: 03/23/2018 05:04   Dg Chest Port 1 View  Result Date: 03/20/2018 CLINICAL DATA:  Recently diagnosed right upper lobe lung cancer with malignant right  pleural effusion status post right VATS with talc pleurodesis. Worsening dyspnea EXAM: PORTABLE CHEST 1 VIEW COMPARISON:  03/12/2018 chest radiograph. FINDINGS: Right internal jugular MediPort terminates at the cavoatrial junction. Right apical chest tube is in place. Worsening near complete opacification of the right hemithorax with minimal residual aeration in the right upper lung. Worsening marked circumferential  right pleural thickening/effusion. Worsened left mediastinal shift. Normal heart size. Stable mediastinal contour. No pneumothorax. No left pleural effusion. Increased patchy left retrocardiac opacity. IMPRESSION: 1. Worsening near complete opacification of the right hemithorax with minimal residual aeration in the right upper lung and worsening diffuse marked right pleural thickening/effusion. 2. Worsening left mediastinal shift with increased patchy left retrocardiac opacity, probably atelectasis. Electronically Signed   By: Ilona Sorrel M.D.   On: 03/20/2018 21:19   Dg Chest Port 1 View  Result Date: 02/26/2018 CLINICAL DATA:  Chest tube in place.  Lung cancer. EXAM: PORTABLE CHEST 1 VIEW COMPARISON:  One-view chest x-ray 02/23/2018 FINDINGS: Heart size is normal. A right IJ Port-A-Cath is stable. Two right-sided chest tubes are stable in position. Interstitial and airspace disease is increasing on the right. A right pleural effusion is suspected. The left lung is clear. The visualized soft tissues and bony thorax are unremarkable. No residual pneumothorax is present. IMPRESSION: 1. Increasing right pleural effusion and airspace disease. 2. PleurX catheter and right-sided chest tube stable in position. 3. Right IJ catheter stable. Electronically Signed   By: San Morelle M.D.   On: 02/26/2018 07:17   Ir US Chest  Result Date: 03/21/2018 CLINICAL DATA:  47 year old male with mediastinal shift and SVC syndrome EXAM: CHEST ULTRASOUND COMPARISON:  CT 03/21/2018, 03/01/2018, 02/21/2018  FINDINGS: Limited ultrasound images were performed in order to locate potential fluid for aspiration/drainage. Ultrasound survey of the anterior and posterior chest demonstrates tumor tissue occupying space around the lungs. Drainage deferred at this time. IMPRESSION: Limited ultrasound images of the chest demonstrate no localized fluid collection for drainage. Drainage deferred at this time. Electronically Signed   By: Corrie Mckusick D.O.   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  #Given the rapid progression of disease/and the activating mutation noted in the EGFR exon 19-recommend osimertinib 80 mg once a day.  Prescription started.  Discussed that the median duration of response is about 2 years; the response rates are about 70 to 80%.  Patient wants to hold off any radiation at this time.  Discussed with Dr. Donella Stade.  He agrees.  Discussed the potential side effects including but not limited to rash diarrhea cardiac abnormalities recent 2D echo within normal limits.   #Pleurx catheter draining about 20 cc a day.  If continued minimal drainage would recommend discontinuation.  #Pain poorly controlled/difficulty sleeping at night recommend adding mscontin 15 BID; in addition to short-acting pain medication.   #Patient wanted to go back to work at this time-which I think is not feasible given his oxygen dependent status; rapidly progressive disease.  Did counsel the patient that if his clinical response is noted then he could go back to work.  # no labs/july 5th.   All questions were answered. The patient knows to call the clinic with any problems, questions or concerns.    Cammie Sickle, MD 03/25/2018 5:50 PM

## 2018-03-20 ENCOUNTER — Other Ambulatory Visit: Payer: Self-pay

## 2018-03-20 ENCOUNTER — Emergency Department: Payer: No Typology Code available for payment source

## 2018-03-20 ENCOUNTER — Ambulatory Visit: Payer: No Typology Code available for payment source

## 2018-03-20 ENCOUNTER — Encounter: Payer: Self-pay | Admitting: *Deleted

## 2018-03-20 ENCOUNTER — Emergency Department
Admission: EM | Admit: 2018-03-20 | Discharge: 2018-03-21 | Disposition: A | Discharge status: Other Acute Inpatient Hospital | Payer: No Typology Code available for payment source | Attending: Emergency Medicine | Admitting: Emergency Medicine

## 2018-03-20 DIAGNOSIS — R0602 Shortness of breath: Secondary | ICD-10-CM | POA: Diagnosis present

## 2018-03-20 DIAGNOSIS — C3431 Malignant neoplasm of lower lobe, right bronchus or lung: Secondary | ICD-10-CM | POA: Insufficient documentation

## 2018-03-20 DIAGNOSIS — Z8673 Personal history of transient ischemic attack (TIA), and cerebral infarction without residual deficits: Secondary | ICD-10-CM | POA: Diagnosis not present

## 2018-03-20 DIAGNOSIS — F419 Anxiety disorder, unspecified: Secondary | ICD-10-CM | POA: Insufficient documentation

## 2018-03-20 DIAGNOSIS — C3491 Malignant neoplasm of unspecified part of right bronchus or lung: Secondary | ICD-10-CM

## 2018-03-20 DIAGNOSIS — I10 Essential (primary) hypertension: Secondary | ICD-10-CM | POA: Insufficient documentation

## 2018-03-20 DIAGNOSIS — Z79899 Other long term (current) drug therapy: Secondary | ICD-10-CM | POA: Insufficient documentation

## 2018-03-20 DIAGNOSIS — R601 Generalized edema: Secondary | ICD-10-CM | POA: Diagnosis not present

## 2018-03-20 DIAGNOSIS — Z87891 Personal history of nicotine dependence: Secondary | ICD-10-CM | POA: Insufficient documentation

## 2018-03-20 DIAGNOSIS — Z7982 Long term (current) use of aspirin: Secondary | ICD-10-CM | POA: Insufficient documentation

## 2018-03-20 LAB — BASIC METABOLIC PANEL
Anion gap: 10 (ref 5–15)
BUN: 23 mg/dL — AB (ref 6–20)
CHLORIDE: 95 mmol/L — AB (ref 98–111)
CO2: 24 mmol/L (ref 22–32)
Calcium: 9.3 mg/dL (ref 8.9–10.3)
Creatinine, Ser: 0.82 mg/dL (ref 0.61–1.24)
GFR calc Af Amer: >60 mL/min (ref >60)
GFR calc non Af Amer: >60 mL/min (ref >60)
Glucose, Bld: 127 mg/dL — ABNORMAL HIGH (ref 70–99)
POTASSIUM: 3.7 mmol/L (ref 3.5–5.1)
Sodium: 129 mmol/L — ABNORMAL LOW (ref 135–145)

## 2018-03-20 LAB — HEPATIC FUNCTION PANEL
ALT: 40 U/L (ref 0–44)
AST: 35 U/L (ref 15–41)
Albumin: 2.7 g/dL — ABNORMAL LOW (ref 3.5–5.0)
Alkaline Phosphatase: 44 U/L (ref 38–126)
Total Bilirubin: 0.5 mg/dL (ref 0.3–1.2)
Total Protein: 5.3 g/dL — ABNORMAL LOW (ref 6.5–8.1)

## 2018-03-20 LAB — URINALYSIS, COMPLETE (UACMP) WITH MICROSCOPIC
BACTERIA UA: NONE SEEN
Bilirubin Urine: NEGATIVE
Glucose, UA: NEGATIVE mg/dL
HGB URINE DIPSTICK: NEGATIVE
Ketones, ur: 5 mg/dL — AB
LEUKOCYTES UA: NEGATIVE
Nitrite: NEGATIVE
PROTEIN: NEGATIVE mg/dL
Specific Gravity, Urine: 1.021 (ref 1.005–1.030)
pH: 6 (ref 5.0–8.0)

## 2018-03-20 LAB — CBC
HEMATOCRIT: 34.2 % — AB (ref 40.0–52.0)
HEMOGLOBIN: 12 g/dL — AB (ref 13.0–18.0)
MCH: 31.4 pg (ref 26.0–34.0)
MCHC: 35 g/dL (ref 32.0–36.0)
MCV: 89.6 fL (ref 80.0–100.0)
Platelets: 206 10*3/uL (ref 150–440)
RBC: 3.81 MIL/uL — AB (ref 4.40–5.90)
RDW: 12.9 % (ref 11.5–14.5)
WBC: 7.2 10*3/uL (ref 3.8–10.6)

## 2018-03-20 LAB — TROPONIN I: Troponin I: <0.03 ng/mL (ref <0.03)

## 2018-03-20 LAB — BRAIN NATRIURETIC PEPTIDE: B NATRIURETIC PEPTIDE 5: 64 pg/mL (ref 0.0–100.0)

## 2018-03-20 MED ORDER — ALBUTEROL SULFATE (2.5 MG/3ML) 0.083% IN NEBU: 5.0000 mg | INHALATION_SOLUTION | Freq: Once | RESPIRATORY_TRACT | Status: DC

## 2018-03-20 MED ORDER — LIDOCAINE-PRILOCAINE 2.5-2.5 % EX CREA
TOPICAL_CREAM | CUTANEOUS | Status: AC
  Filled 2018-03-20: qty 5

## 2018-03-20 NOTE — ED Triage Notes (Signed)
Pt to ED reporting SOB since last week that has been worsening. Pt is currently a chemo pt and received last Tuesday. Family has tried compression socks and multiple other interventions without success. Generalized +3 ptting edema. Pt unable to lay flat without unbearable SOB. Pt is on 4: [place] upon arrival to ED. Family reporting his oxygen dropped into the 80s on 2L this week.

## 2018-03-20 NOTE — ED Notes (Signed)
Pt updated to the best of the RNs ability. Family at bedside.

## 2018-03-20 NOTE — Telephone Encounter (Signed)
Oral Chemotherapy Pharmacist Encounter  Patient Education I spoke with patient and his wife following his OV on 03/19/18 for overview of new oral chemotherapy medication: Tagrisso (osimertinib) for the treatment of NSCLC EGFR positive, planned duration until disease progression or unacceptable drug toxicity.   Counseled patient on administration, dosing, side effects, monitoring, drug-food interactions, safe handling, storage, and disposal. Patient will take 1 tablet (80 mg total) by mouth daily.  Side effects include but not limited to: diarrhea, rash, decreased wbc/plt/hgb .    Reviewed with patient importance of keeping a medication schedule and plan for any missed doses.  Ruben Eaton voiced understanding and appreciation. All questions answered. Medication handout provided and consent obtained.   Provided patient with [facility] phone number. Patient knows to call the office with questions or concerns. Oral Chemotherapy Navigation [facility] will continue to follow.  Darl Pikes, PharmD, BCPS Hematology/Oncology Clinical Pharmacist [facility] [phone]  03/20/2018 8:42 AM

## 2018-03-20 NOTE — ED Provider Notes (Addendum)
[facility] Emergency Department Provider Note  ____________________________________________   I have reviewed the triage vital signs and the nursing notes. Where available I have reviewed prior notes and, if possible and indicated, outside hospital notes.    HISTORY  Chief Complaint Shortness of Breath and Leg Swelling    HPI Ruben Eaton is a 47 y.o. male is quite healthy until recently, when he was unfortunately diagnosed with a very aggressive non-small cell carcinoma of the lung, patient has had 1 dose of chemotherapy a week ago.  He had a CT scan done on the 17th of this month which showed that the tumor has rapidly progressed, with mediastinal shift indicated at that time, patient is on 2 L of home oxygen, on the 31st of last month he had a Pleurx placed, which has been draining small amounts of fluid since it was placed.  Approximately 20 cc every few days.  Patient denies any chest pain but is in increasing shortness of breath today and anasarca take swelling to the arms and legs is been noted is rapidly progressing over the last few days and for this reason he comes to the emergency department.  He denies any fever or chills no dysuria no urinary frequency, he states that he is progressively more orthopneic and dyspneic.   Past Medical History:  Diagnosis Date  . 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 09/15/2016 in setting of PFO.  . CVA (cerebral vascular accident) ([place]) 09/07/2016  . ED (erectile dysfunction)   . Essential (primary) hypertension 02/01/2016  . History of stroke 10/08/2016  . Hx of insomnia   . Hypertension   . OSA on CPAP   . PFO (patent foramen ovale)    a. s/p closure 09/2016 by Dr. [name].  . Pleural effusion on right 02/21/2018  . Primary cancer of right lower lobe of lung ([place]) 02/25/2018  . PVC's (premature ventricular contractions) 10/08/2016  . 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 TIA (transient ischemic  attack) 09/07/2016    Patient Active Problem List   Diagnosis Date Noted  . Goals of care, counseling/discussion 03/02/2018  . Anxiety 03/02/2018  . Primary cancer of right lower lobe of lung ([place]) 02/25/2018  . Pleural effusion on right 02/21/2018  . Vaccine counseling 09/01/2017  . OSA on CPAP 10/27/2016  . History of stroke 10/08/2016  . PVC's (premature ventricular contractions) 10/08/2016  . PFO (patent foramen ovale) 10/07/2016  . TIA (transient ischemic attack) 09/07/2016  . CVA (cerebral vascular accident) ([place]) 09/07/2016  . 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 Essential (primary) hypertension 02/01/2016  . H/O disease 02/01/2016  . Quadriceps tendinitis 02/01/2016    Past Surgical History:  Procedure Laterality Date  . APPENDECTOMY  1980  . EYE SURGERY    . PATENT FORAMEN OVALE CLOSURE  10/07/2016  . PATENT FORAMEN OVALE CLOSURE N/A 10/07/2016   Procedure: Patent [place](PFO) Closure;  Surgeon: [name], MD;  Location: [facility];  Service: Cardiovascular;  Laterality: N/A;  . PORTACATH PLACEMENT N/A 02/23/2018   Procedure: INSERTION PORT-A-CATH;  Surgeon: Nestor Lewandowsky, MD;  Location: [facility] OR[facility];  Service: Thoracic;  Laterality: N/A;  . STRABISMUS SURGERY Left 1984  . TEE WITHOUT CARDIOVERSION N/A 09/08/2016   Procedure: TRANSESOPHAGEAL ECHOCARDIOGRAM (TEE);  Surgeon: [name], MD;  Location: [facility] OR[facility];  Service: Cardiovascular;  Laterality: N/A;  . VIDEO ASSISTED THORACOSCOPY (VATS) W/TALC PLEUADESIS Right 02/23/2018   Procedure: VIDEO ASSISTED THORACOSCOPY (  VATS) W/TALC PLEUADESIS;  Surgeon: Nestor Lewandowsky, MD;  Location: [facility] OR[facility];  Service: Thoracic;  Laterality: Right;  . VITRECTOMY Right 2013    Prior to Admission medications   Medication Sig Start Date End Date Taking? Authorizing Provider  aspirin EC 81 MG EC tablet Take 1 tablet (81 mg total) by mouth daily. 09/08/16  Yes Mody, Ulice Bold, MD  b complex vitamins  capsule Take 1 capsule by mouth daily.    Yes [provider]  B Complex-C (B-COMPLEX WITH VITAMIN C) tablet Take 1 tablet by mouth daily.   Yes [provider]  dexamethasone (DECADRON) 4 MG tablet Take 2 tablets (8 mg total) by mouth daily. Start the day after chemotherapy for 2 days. 03/12/18  Yes Earlie Server, MD  Diclofenac Sodium 2 % SOLN Apply 1 pump twice daily as needed. Patient taking differently: Apply 1 application topically 2 (two) times daily as needed (pain). Apply 1 pump twice daily as needed. 02/01/16  Yes Lyndal Pulley, DO  loratadine (CLARITIN) 10 MG tablet Take 10 mg by mouth daily.    Yes [provider]  LORazepam (ATIVAN) 0.5 MG tablet Take 1 tablet (0.5 mg total) by mouth every 8 (eight) hours as needed for anxiety. 03/02/18  Yes Earlie Server, MD  metoprolol tartrate (LOPRESSOR) 25 MG tablet Take 1 tablet (25 mg total) by mouth 2 (two) times daily. 02/27/18  Yes Dustin Flock, MD  morphine (MS CONTIN) 15 MG 12 hr tablet Take 1 tablet (15 mg total) by mouth every 12 (twelve) hours. 03/19/18  Yes Cammie Sickle, MD  Morphine Sulfate (MORPHINE CONCENTRATE) 10 mg / 0.5 ml concentrated solution Take 1 mL (20 mg total) by mouth every 4 (four) hours as needed for shortness of breath. 03/12/18  Yes Earlie Server, MD  Multiple Vitamin (MULTIVITAMIN WITH MINERALS) TABS tablet Take 1 tablet by mouth daily.   Yes [provider]  prochlorperazine (COMPAZINE) 10 MG tablet Take 1 tablet (10 mg total) by mouth every 6 (six) hours as needed (Nausea or vomiting). 03/13/18  Yes Earlie Server, MD  senna-docusate (SENOKOT-S) 8.6-50 MG tablet Take 1 tablet by mouth daily. 02/27/18  Yes Dustin Flock, MD  tadalafil (CIALIS) 5 MG tablet Take 5 mg by mouth daily as needed for erectile dysfunction.   Yes [provider]  triamcinolone (NASACORT) 55 MCG/ACT AERO nasal inhaler Place 2 sprays into the nose daily as needed (congestion).    Yes [provider]  Vitamin  D, Ergocalciferol, (DRISDOL) 50000 units CAPS capsule Take 1 capsule (50,000 Units total) by mouth every 7 (seven) days. Patient taking differently: Take 50,000 Units by mouth every 7 (seven) days. Mondays 05/05/16  Yes Lyndal Pulley, DO  zolpidem (AMBIEN) 10 MG tablet Take 10 mg by mouth at bedtime as needed for sleep.  12/10/15  Yes [provider]  atorvastatin (LIPITOR) 40 MG tablet Take 1 tablet (40 mg total) by mouth daily at 6 PM. Patient not taking: Reported on 03/13/2018 09/08/16   Bettey Costa, MD  levofloxacin (LEVAQUIN) 500 MG tablet Take 1 tablet (500 mg total) by mouth daily. Patient not taking: Reported on 03/13/2018 03/07/18   Jacquelin Hawking, NP  ondansetron (ZOFRAN) 8 MG tablet Take 1 tablet (8 mg total) by mouth 2 (two) times daily as needed for refractory nausea / vomiting. 03/12/18   Earlie Server, MD  osimertinib mesylate (TAGRISSO) 80 MG tablet Take 1 tablet (80 mg total) by mouth daily. Patient not taking: Reported on 03/20/2018  03/19/18   Cammie Sickle, MD    Allergies Patient has no known allergies.  Family History  Problem Relation Age of Onset  . Other Paternal Uncle        Abdominal tumor     Social History Social History   Tobacco Use  . Smoking status: Former Smoker    Packs/day: 0.50    Years: 10.00    Pack years: 5.00    Last attempt to quit: 2011    Years since quitting: 8.4  . Smokeless tobacco: Former Systems developer    Types: Snuff  Substance Use Topics  . Alcohol use: Yes    Alcohol/week: 1.8 oz    Types: 3 Cans of beer per week  . Drug use: No    Review of Systems Constitutional: No fever/chills Eyes: No visual changes. ENT: No sore throat. No stiff neck no neck pain Cardiovascular: Denies chest pain. Respiratory: Denies shortness of breath. Gastrointestinal:   no vomiting.  No diarrhea.  No constipation. Genitourinary: Negative for dysuria. Musculoskeletal: Negative lower extremity swelling Skin: Negative for rash. Neurological:  Negative for severe headaches, focal weakness or numbness.   ____________________________________________   PHYSICAL EXAM:  VITAL SIGNS: ED Triage Vitals  Enc Vitals Group     BP 03/20/18 2030 (!) 153/70     Pulse Rate 03/20/18 2030 97     Resp 03/20/18 2034 20     Temp 03/20/18 2034 (!) 97 F (36.1 C)     Temp Source 03/20/18 2034 Oral     SpO2 03/20/18 2030 92 %     Weight 03/20/18 2035 182 lb 15.7 oz (83 kg)     Height 03/20/18 2035 5\' 6"  (1.676 m)     Head Circumference --      Peak Flow --      Pain Score 03/20/18 2036 4     Pain Loc --      Pain Edu? --      Excl. in [place]? --     Constitutional: Alert and oriented.  Ill-appearing gentleman sitting acutely up on the side of the bed but conversant in full sentences Eyes: Conjunctivae are normal Head: Atraumatic HEENT: No congestion/rhinnorhea. Mucous membranes are moist.  Oropharynx non-erythematous Neck:   Nontender with no meningismus, no masses, no stridor Cardiovascular: Normal rate, regular rhythm. Grossly normal heart sounds.  Good peripheral circulation. Respiratory: Upright on the side of the bed, diminished respiratory sounds significantly noted in the right lung.  No wheeze or other pathology noted on the left. Abdominal: Soft and nontender. No distention. No guarding no rebound Back:  There is no focal tenderness or step off.  there is no midline tenderness there are no lesions noted. there is no CVA tenderness Musculoskeletal: No lower extremity tenderness, no upper extremity tenderness. No joint effusions, significant swelling noted to upper and lower extremities Neurologic:  Normal speech and language. No gross focal neurologic deficits are appreciated.  Skin:  Skin is warm, dry and intact. No rash noted. Psychiatric: Mood and affect are normal. Speech and behavior are normal.  ____________________________________________   LABS (all labs ordered are listed, but only abnormal results are displayed)  Labs  Reviewed  CBC - Abnormal; Notable for the following components:      Result Value   RBC 3.81 (*)    Hemoglobin 12.0 (*)    HCT 34.2 (*)    All other components within normal limits  BASIC METABOLIC PANEL - Abnormal; Notable for the following components:  Sodium 129 (*)    Chloride 95 (*)    Glucose, Bld 127 (*)    BUN 23 (*)    All other components within normal limits  HEPATIC FUNCTION PANEL - Abnormal; Notable for the following components:   Total Protein 5.3 (*)    Albumin 2.7 (*)    All other components within normal limits  TROPONIN I  BRAIN NATRIURETIC PEPTIDE  URINALYSIS, COMPLETE (UACMP) WITH MICROSCOPIC    Pertinent labs  results that were available during my care of the patient were reviewed by me and considered in my medical decision making (see chart for details). ____________________________________________  EKG  I personally interpreted any EKGs ordered by me or triage Sinus tach rate 109, diffuse ST changes consistent with prior EKG yesterday.  Normal axis. ____________________________________________  RADIOLOGY  Pertinent labs & imaging results that were available during my care of the patient were reviewed by me and considered in my medical decision making (see chart for details). If possible, patient and/or family made aware of any abnormal findings.  Dg Chest Port 1 View  Result Date: 03/20/2018 CLINICAL DATA:  Recently diagnosed right upper lobe lung cancer with malignant right pleural effusion status post right VATS with talc pleurodesis. Worsening dyspnea EXAM: PORTABLE CHEST 1 VIEW COMPARISON:  03/12/2018 chest radiograph. FINDINGS: Right internal jugular MediPort terminates at the cavoatrial junction. Right apical chest tube is in place. Worsening near complete opacification of the right hemithorax with minimal residual aeration in the right upper lung. Worsening marked circumferential right pleural thickening/effusion. Worsened left mediastinal shift.  Normal heart size. Stable mediastinal contour. No pneumothorax. No left pleural effusion. Increased patchy left retrocardiac opacity. IMPRESSION: 1. Worsening near complete opacification of the right hemithorax with minimal residual aeration in the right upper lung and worsening diffuse marked right pleural thickening/effusion. 2. Worsening left mediastinal shift with increased patchy left retrocardiac opacity, probably atelectasis. Electronically Signed   By: Ilona Sorrel M.D.   On: 03/20/2018 21:19   ____________________________________________    PROCEDURES  Procedure(s) performed: None  Procedures  Critical Care performed: CRITICAL CARE Performed by: Schuyler Amor   Total critical care time: 45 minutes  Critical care time was exclusive of separately billable procedures and treating other patients.  Critical care was necessary to treat or prevent imminent or life-threatening deterioration.  Critical care was time spent personally by me on the following activities: development of treatment plan with patient and/or surrogate as well as nursing, discussions with consultants, evaluation of patient's response to treatment, examination of patient, obtaining history from patient or surrogate, ordering and performing treatments and interventions, ordering and review of laboratory studies, ordering and review of radiographic studies, pulse oximetry and re-evaluation of patient's condition.   ____________________________________________   INITIAL IMPRESSION / ASSESSMENT AND PLAN / ED COURSE  Pertinent labs & imaging results that were available during my care of the patient were reviewed by me and considered in my medical decision making (see chart for details).  Patient here with anasarca changes, differential includes low albumin, also given his x-ray showing increased mediastinal shift, he may be having difficulty with venous return to the heart, he does have a Pleurx which is only  draining a little bit, does appear to be in place to the extent that we can determine.  No evidence of CHF on blood work,  I discussed with Dr. Mike Gip, of oncology, she recommends admission to the hospitalist service and cardiothoracic consult.  I talked to the family, they are entertaining  whether they wish to be transferred to a tertiary care center however, they cannot go to [facility] or [facility] because of the patient's insurance he can only go to [facility]  I therefore have called [facility] and talk to [place] of cardiothoracic surgery there he states that he is happy to take care of the patient in consultation from the medicine service.  I also talked to the hospitalist at [facility], and his name is Dr. Blaine Hamper, he is also willing to take the patient, we will discuss with Dr. Genevive Bi prior to transfer although Dr. Faith Rogue is not technically on-call at this hour, we will see if we can get him on the phone and see if he can feel comfortable managing this here, if he does in the family would prefer to stay that is fine if not we have receiving physicians at [facility] depending on the family's decision.  Very unfortunate patient.  I will avoid giving him Lasix as I am worried that he might be is to some extent preload dependent with a mediastinal shift, signs have been reassuring up to now  ----------------------------------------- 11:33 PM on 03/20/2018 -----------------------------------------  Patient and family in agreement with this plan.  Vital signs are reassuring thus far, analysis reassuring, review of prior CT scan from a week ago shows that really most of this is tumor there is very small effusion, this is simply bulky tumor pushing his mediastinum.  This will require most likely surgical intervention although we will defer to surgery obviously with a think.  In addition, we did page Dr. Genevive Bi to, he is not technically on call after 5 PM and has not yet called  back.  ----------------------------------------- 11:56 PM on 03/20/2018 -----------------------------------------  Signed out to dr. Joni Fears pending transport.     ____________________________________________   FINAL CLINICAL IMPRESSION(S) / ED DIAGNOSES  Final diagnoses:  SOB (shortness of breath)      This chart was dictated using voice recognition software.  Despite best efforts to proofread,  errors can occur which can change meaning.      Schuyler Amor, MD 03/20/18 2259    Schuyler Amor, MD 03/20/18 1660    Schuyler Amor, MD 03/20/18 6004    Schuyler Amor, MD 03/20/18 2356

## 2018-03-20 NOTE — Telephone Encounter (Signed)
Oral Oncology Patient Advocate Encounter  Signed patient up for [facility] card.  Patient copay will be reduced to $0.  BIN:  [id number] PCN: [id number] ID: [id number] Group:  [id number]  [place] Nancy Erath Patient [name] Phone [phone] Fax [phone] 03/20/2018 8:31 AM

## 2018-03-21 ENCOUNTER — Ambulatory Visit
Admit: 2018-03-21 | Discharge: 2018-03-21 | Disposition: A | Discharge status: Home / Self Care | Payer: No Typology Code available for payment source | Attending: Radiation Oncology | Admitting: Radiation Oncology

## 2018-03-21 ENCOUNTER — Encounter (HOSPITAL_COMMUNITY): Payer: Self-pay | Admitting: Internal Medicine

## 2018-03-21 ENCOUNTER — Inpatient Hospital Stay (HOSPITAL_COMMUNITY)
Admission: AD | Admit: 2018-03-21 | Discharge: 2018-03-27 | DRG: 180 | Disposition: A | Discharge status: Home / Self Care | Payer: No Typology Code available for payment source | Source: Other Acute Inpatient Hospital | Attending: Internal Medicine | Admitting: Internal Medicine

## 2018-03-21 ENCOUNTER — Inpatient Hospital Stay (HOSPITAL_COMMUNITY): Payer: No Typology Code available for payment source

## 2018-03-21 ENCOUNTER — Other Ambulatory Visit: Payer: Self-pay

## 2018-03-21 ENCOUNTER — Ambulatory Visit
Admit: 2018-03-21 | Discharge: 2018-03-21 | Disposition: A | Discharge status: Home / Self Care | Payer: No Typology Code available for payment source | Source: Ambulatory Visit | Attending: Radiation Oncology | Admitting: Radiation Oncology

## 2018-03-21 ENCOUNTER — Ambulatory Visit: Payer: No Typology Code available for payment source

## 2018-03-21 ENCOUNTER — Encounter: Payer: Self-pay | Admitting: *Deleted

## 2018-03-21 DIAGNOSIS — C3411 Malignant neoplasm of upper lobe, right bronchus or lung: Secondary | ICD-10-CM | POA: Diagnosis not present

## 2018-03-21 DIAGNOSIS — Z8673 Personal history of transient ischemic attack (TIA), and cerebral infarction without residual deficits: Secondary | ICD-10-CM | POA: Diagnosis not present

## 2018-03-21 DIAGNOSIS — R600 Localized edema: Secondary | ICD-10-CM | POA: Diagnosis present

## 2018-03-21 DIAGNOSIS — Z87891 Personal history of nicotine dependence: Secondary | ICD-10-CM | POA: Diagnosis not present

## 2018-03-21 DIAGNOSIS — F05 Delirium due to known physiological condition: Secondary | ICD-10-CM | POA: Diagnosis not present

## 2018-03-21 DIAGNOSIS — Z9049 Acquired absence of other specified parts of digestive tract: Secondary | ICD-10-CM | POA: Diagnosis not present

## 2018-03-21 DIAGNOSIS — R601 Generalized edema: Secondary | ICD-10-CM | POA: Diagnosis present

## 2018-03-21 DIAGNOSIS — D61818 Other pancytopenia: Secondary | ICD-10-CM | POA: Diagnosis present

## 2018-03-21 DIAGNOSIS — Z79891 Long term (current) use of opiate analgesic: Secondary | ICD-10-CM | POA: Diagnosis not present

## 2018-03-21 DIAGNOSIS — J181 Lobar pneumonia, unspecified organism: Secondary | ICD-10-CM | POA: Diagnosis present

## 2018-03-21 DIAGNOSIS — C3490 Malignant neoplasm of unspecified part of unspecified bronchus or lung: Secondary | ICD-10-CM | POA: Diagnosis not present

## 2018-03-21 DIAGNOSIS — G4700 Insomnia, unspecified: Secondary | ICD-10-CM | POA: Diagnosis present

## 2018-03-21 DIAGNOSIS — D6959 Other secondary thrombocytopenia: Secondary | ICD-10-CM | POA: Diagnosis present

## 2018-03-21 DIAGNOSIS — R0602 Shortness of breath: Secondary | ICD-10-CM | POA: Diagnosis present

## 2018-03-21 DIAGNOSIS — C3481 Malignant neoplasm of overlapping sites of right bronchus and lung: Secondary | ICD-10-CM | POA: Diagnosis not present

## 2018-03-21 DIAGNOSIS — J9601 Acute respiratory failure with hypoxia: Secondary | ICD-10-CM | POA: Diagnosis not present

## 2018-03-21 DIAGNOSIS — Z9981 Dependence on supplemental oxygen: Secondary | ICD-10-CM | POA: Diagnosis not present

## 2018-03-21 DIAGNOSIS — Z9989 Dependence on other enabling machines and devices: Secondary | ICD-10-CM | POA: Diagnosis not present

## 2018-03-21 DIAGNOSIS — I871 Compression of vein: Secondary | ICD-10-CM | POA: Diagnosis present

## 2018-03-21 DIAGNOSIS — Z7189 Other specified counseling: Secondary | ICD-10-CM

## 2018-03-21 DIAGNOSIS — N529 Male erectile dysfunction, unspecified: Secondary | ICD-10-CM | POA: Diagnosis present

## 2018-03-21 DIAGNOSIS — G4733 Obstructive sleep apnea (adult) (pediatric): Secondary | ICD-10-CM | POA: Diagnosis present

## 2018-03-21 DIAGNOSIS — C3492 Malignant neoplasm of unspecified part of left bronchus or lung: Secondary | ICD-10-CM | POA: Diagnosis not present

## 2018-03-21 DIAGNOSIS — J90 Pleural effusion, not elsewhere classified: Secondary | ICD-10-CM | POA: Diagnosis not present

## 2018-03-21 DIAGNOSIS — R0902 Hypoxemia: Secondary | ICD-10-CM

## 2018-03-21 DIAGNOSIS — R9389 Abnormal findings on diagnostic imaging of other specified body structures: Secondary | ICD-10-CM | POA: Diagnosis not present

## 2018-03-21 DIAGNOSIS — Z8774 Personal history of (corrected) congenital malformations of heart and circulatory system: Secondary | ICD-10-CM | POA: Diagnosis not present

## 2018-03-21 DIAGNOSIS — C3431 Malignant neoplasm of lower lobe, right bronchus or lung: Principal | ICD-10-CM | POA: Diagnosis present

## 2018-03-21 DIAGNOSIS — I493 Ventricular premature depolarization: Secondary | ICD-10-CM | POA: Diagnosis present

## 2018-03-21 DIAGNOSIS — J9811 Atelectasis: Secondary | ICD-10-CM | POA: Diagnosis present

## 2018-03-21 DIAGNOSIS — Z7952 Long term (current) use of systemic steroids: Secondary | ICD-10-CM

## 2018-03-21 DIAGNOSIS — J910 Malignant pleural effusion: Secondary | ICD-10-CM | POA: Diagnosis present

## 2018-03-21 DIAGNOSIS — J9600 Acute respiratory failure, unspecified whether with hypoxia or hypercapnia: Secondary | ICD-10-CM | POA: Diagnosis not present

## 2018-03-21 DIAGNOSIS — Z7982 Long term (current) use of aspirin: Secondary | ICD-10-CM | POA: Diagnosis not present

## 2018-03-21 DIAGNOSIS — Y95 Nosocomial condition: Secondary | ICD-10-CM | POA: Diagnosis present

## 2018-03-21 DIAGNOSIS — I10 Essential (primary) hypertension: Secondary | ICD-10-CM | POA: Diagnosis present

## 2018-03-21 DIAGNOSIS — Z79899 Other long term (current) drug therapy: Secondary | ICD-10-CM

## 2018-03-21 DIAGNOSIS — C3491 Malignant neoplasm of unspecified part of right bronchus or lung: Secondary | ICD-10-CM | POA: Diagnosis not present

## 2018-03-21 LAB — CBC
HEMATOCRIT: 33.1 % — AB (ref 39.0–52.0)
HEMOGLOBIN: 11.2 g/dL — AB (ref 13.0–17.0)
MCH: 30 pg (ref 26.0–34.0)
MCHC: 33.8 g/dL (ref 30.0–36.0)
MCV: 88.7 fL (ref 78.0–100.0)
Platelets: 198 10*3/uL (ref 150–400)
RBC: 3.73 MIL/uL — ABNORMAL LOW (ref 4.22–5.81)
RDW: 11.8 % (ref 11.5–15.5)
WBC: 6.4 10*3/uL (ref 4.0–10.5)

## 2018-03-21 LAB — BASIC METABOLIC PANEL
Anion gap: 9 (ref 5–15)
BUN: 18 mg/dL (ref 6–20)
CALCIUM: 9.6 mg/dL (ref 8.9–10.3)
CO2: 26 mmol/L (ref 22–32)
CREATININE: 0.88 mg/dL (ref 0.61–1.24)
Chloride: 96 mmol/L — ABNORMAL LOW (ref 98–111)
GFR calc Af Amer: >60 mL/min (ref >60)
Glucose, Bld: 111 mg/dL — ABNORMAL HIGH (ref 70–99)
POTASSIUM: 3.7 mmol/L (ref 3.5–5.1)
SODIUM: 131 mmol/L — AB (ref 135–145)

## 2018-03-21 LAB — PROTIME-INR
INR: 1.16
Prothrombin Time: 14.7 seconds (ref 11.4–15.2)

## 2018-03-21 LAB — SURGICAL PCR SCREEN
MRSA, PCR: NEGATIVE
Staphylococcus aureus: NEGATIVE

## 2018-03-21 LAB — GLUCOSE, CAPILLARY
GLUCOSE-CAPILLARY: 113 mg/dL — AB (ref 70–99)
Glucose-Capillary: 105 mg/dL — ABNORMAL HIGH (ref 70–99)
Glucose-Capillary: 113 mg/dL — ABNORMAL HIGH (ref 70–99)

## 2018-03-21 MED ORDER — ACETAMINOPHEN 650 MG RE SUPP: 650.0000 mg | Freq: Four times a day (QID) | RECTAL | Status: DC | PRN

## 2018-03-21 MED ORDER — IOPAMIDOL (ISOVUE-370) INJECTION 76%
100.0000 mL | Freq: Once | INTRAVENOUS | Status: AC | PRN
  Administered 2018-03-21: 72 mL via INTRAVENOUS

## 2018-03-21 MED ORDER — PIPERACILLIN-TAZOBACTAM 3.375 G IVPB
3.3750 g | Freq: Three times a day (TID) | INTRAVENOUS | Status: AC
Start: 2018-03-21 — End: 2018-03-26
  Administered 2018-03-21 – 2018-03-25 (×14): 3.375 g via INTRAVENOUS
  Filled 2018-03-21 (×14): qty 50

## 2018-03-21 MED ORDER — PROCHLORPERAZINE MALEATE 10 MG PO TABS
10.0000 mg | ORAL_TABLET | Freq: Four times a day (QID) | ORAL | Status: DC | PRN
  Administered 2018-03-26: 10 mg via ORAL
  Filled 2018-03-21 (×2): qty 1

## 2018-03-21 MED ORDER — METOPROLOL TARTRATE 25 MG PO TABS
25.0000 mg | ORAL_TABLET | Freq: Two times a day (BID) | ORAL | Status: DC
  Administered 2018-03-21 – 2018-03-27 (×13): 25 mg via ORAL
  Filled 2018-03-21 (×13): qty 1

## 2018-03-21 MED ORDER — SODIUM CHLORIDE 0.9 % IV SOLN: INTRAVENOUS | Status: DC

## 2018-03-21 MED ORDER — VANCOMYCIN HCL 10 G IV SOLR
1500.0000 mg | Freq: Once | INTRAVENOUS | Status: AC
  Administered 2018-03-21: 1500 mg via INTRAVENOUS
  Filled 2018-03-21: qty 1500

## 2018-03-21 MED ORDER — PIPERACILLIN-TAZOBACTAM 3.375 G IVPB 30 MIN
3.3750 g | Freq: Once | INTRAVENOUS | Status: AC
  Administered 2018-03-21: 3.375 g via INTRAVENOUS
  Filled 2018-03-21: qty 50

## 2018-03-21 MED ORDER — SENNOSIDES-DOCUSATE SODIUM 8.6-50 MG PO TABS
1.0000 | ORAL_TABLET | Freq: Every day | ORAL | Status: DC
Start: 2018-03-21 — End: 2018-03-27
  Administered 2018-03-21 – 2018-03-22 (×2): 1 via ORAL
  Filled 2018-03-21 (×4): qty 1

## 2018-03-21 MED ORDER — ORAL CARE MOUTH RINSE
15.0000 mL | Freq: Two times a day (BID) | OROMUCOSAL | Status: DC
  Administered 2018-03-21 – 2018-03-27 (×8): 15 mL via OROMUCOSAL

## 2018-03-21 MED ORDER — ASPIRIN EC 81 MG PO TBEC
81.0000 mg | DELAYED_RELEASE_TABLET | Freq: Every day | ORAL | Status: DC
  Administered 2018-03-22 – 2018-03-27 (×6): 81 mg via ORAL
  Filled 2018-03-21 (×7): qty 1

## 2018-03-21 MED ORDER — ADULT MULTIVITAMIN W/MINERALS CH
1.0000 | ORAL_TABLET | Freq: Every day | ORAL | Status: DC
  Administered 2018-03-21 – 2018-03-27 (×7): 1 via ORAL
  Filled 2018-03-21 (×7): qty 1

## 2018-03-21 MED ORDER — OSIMERTINIB MESYLATE 80 MG PO TABS
80.0000 mg | ORAL_TABLET | Freq: Every day | ORAL | Status: DC
  Administered 2018-03-22 – 2018-03-27 (×6): 80 mg via ORAL

## 2018-03-21 MED ORDER — ONDANSETRON HCL 4 MG PO TABS: 4.0000 mg | ORAL_TABLET | Freq: Four times a day (QID) | ORAL | Status: DC | PRN

## 2018-03-21 MED ORDER — SODIUM CHLORIDE 0.9 % IV SOLN
INTRAVENOUS | Status: DC | PRN
  Administered 2018-03-21: 1000 mL via INTRAVENOUS

## 2018-03-21 MED ORDER — MORPHINE SULFATE ER 15 MG PO TBCR
15.0000 mg | EXTENDED_RELEASE_TABLET | Freq: Two times a day (BID) | ORAL | Status: DC
  Filled 2018-03-21 (×2): qty 1

## 2018-03-21 MED ORDER — ACETAMINOPHEN 325 MG PO TABS
650.0000 mg | ORAL_TABLET | Freq: Four times a day (QID) | ORAL | Status: DC | PRN
  Administered 2018-03-24: 650 mg via ORAL
  Filled 2018-03-21: qty 2

## 2018-03-21 MED ORDER — VANCOMYCIN HCL IN DEXTROSE 1-5 GM/200ML-% IV SOLN: 1000.0000 mg | Freq: Two times a day (BID) | INTRAVENOUS | Status: DC

## 2018-03-21 MED ORDER — MORPHINE SULFATE (CONCENTRATE) 10 MG/0.5ML PO SOLN
20.0000 mg | ORAL | Status: DC | PRN
  Administered 2018-03-21: 20 mg via ORAL
  Administered 2018-03-22 (×2): 10 mg via ORAL
  Filled 2018-03-21 (×3): qty 1

## 2018-03-21 MED ORDER — VANCOMYCIN HCL IN DEXTROSE 750-5 MG/150ML-% IV SOLN
750.0000 mg | Freq: Three times a day (TID) | INTRAVENOUS | Status: DC
  Administered 2018-03-21: 750 mg via INTRAVENOUS
  Filled 2018-03-21 (×3): qty 150

## 2018-03-21 MED ORDER — ONDANSETRON HCL 4 MG/2ML IJ SOLN: 4.0000 mg | Freq: Four times a day (QID) | INTRAMUSCULAR | Status: DC | PRN

## 2018-03-21 NOTE — Consult Note (Addendum)
Chief Complaint: Patient was seen in consultation today for right chest tube drain placement at the request of Dr Fatima Blank   Supervising Physician: Corrie Mckusick  Patient Status: [facility] - In-pt  History of Present Illness: Ruben Eaton is a 47 y.o. male   Hx CVA--PFO SOB- worsening approx 3 - 4 weeks ago Diagnosed originally effusion and thoracentesis was performed 2L pleural fluid 02/22/18 Pleural bx 5/31: DIAGNOSIS:  A. AND B. PLEURA; BIOPSY:  - HIGH-GRADE CARCINOMA WITH SARCOMATOID FEATURES.  Chemotherapy and pleurX drain placed Hx Talc pleurodesis Seemed to do well for few weeks Was seen then at [facility] 6/25 with increasing SOB Tx to [facility] CT today:  IMPRESSION: 1. No CT evidence of pulmonary embolism. 2. Large complex pleural effusion and pleural based masses with near complete collapse of the right lung and associated mass effect and shift of the mediastinum into the left hemithorax. There is occlusion of the right mainstem bronchus secondary to mass effect and extrinsic compression. 3. Left lower lobe and lingula airspace densities most consistent with pneumonia. Clinical correlation recommended.  Noted is Bilat hand and arm swelling; Rt worse than left Bilat LE swelling SVC syndrome from tumor/fluid burden in chest?  CCM asking for chest tube drain placement Dr Earleen Newport has reviewed imaging and approves procedure  Past Medical History:  Diagnosis Date  . 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 09/15/2016 in setting of PFO.  . CVA (cerebral vascular accident) ([place]) 09/07/2016  . ED (erectile dysfunction)   . Essential (primary) hypertension 02/01/2016  . History of stroke 10/08/2016  . Hx of insomnia   . Hypertension   . OSA on CPAP   . PFO (patent foramen ovale)    a. s/p closure 09/2016 by Dr. [name].  . Pleural effusion on right 02/21/2018  . Primary cancer of right lower lobe of lung ([place]) 02/25/2018  . PVC's (premature ventricular contractions) 10/08/2016  .  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 TIA (transient ischemic attack) 09/07/2016    Past Surgical History:  Procedure Laterality Date  . APPENDECTOMY  1980  . EYE SURGERY    . PATENT FORAMEN OVALE CLOSURE  10/07/2016  . PATENT FORAMEN OVALE CLOSURE N/A 10/07/2016   Procedure: Patent [place](PFO) Closure;  Surgeon: [name], MD;  Location: [facility];  Service: Cardiovascular;  Laterality: N/A;  . PORTACATH PLACEMENT N/A 02/23/2018   Procedure: INSERTION PORT-A-CATH;  Surgeon: Nestor Lewandowsky, MD;  Location: [facility] OR[facility];  Service: Thoracic;  Laterality: N/A;  . STRABISMUS SURGERY Left 1984  . TEE WITHOUT CARDIOVERSION N/A 09/08/2016   Procedure: TRANSESOPHAGEAL ECHOCARDIOGRAM (TEE);  Surgeon: [name], MD;  Location: [facility] ORS;  Service: Cardiovascular;  Laterality: N/A;  . VIDEO ASSISTED THORACOSCOPY (VATS) W/TALC PLEUADESIS Right 02/23/2018   Procedure: VIDEO ASSISTED THORACOSCOPY (VATS) W/TALC PLEUADESIS;  Surgeon: Nestor Lewandowsky, MD;  Location: [facility] ORS;  Service: Thoracic;  Laterality: Right;  . VITRECTOMY Right 2013    Allergies: Patient has no known allergies.  Medications: Prior to Admission medications   Medication Sig Start Date End Date Taking? Authorizing Provider  aspirin EC 81 MG EC tablet Take 1 tablet (81 mg total) by mouth daily. 09/08/16  Yes Mody, Ulice Bold, MD  B Complex-C (B-COMPLEX WITH VITAMIN C) tablet Take 1 tablet by mouth daily.   Yes [provider]  dexamethasone (DECADRON) 4 MG tablet Take 2 tablets (8 mg total) by mouth daily. Start the day after chemotherapy for 2 days. 03/12/18  Yes Earlie Server, MD  Diclofenac Sodium 2 % SOLN Apply 1 pump twice daily as needed. Patient taking differently: Apply 1 application topically 2 (two) times daily as needed (pain). Apply 1 pump twice daily as needed. 02/01/16  Yes Lyndal Pulley, DO  loratadine (CLARITIN) 10 MG tablet Take 10 mg by mouth daily as needed for allergies.    Yes  [provider]  LORazepam (ATIVAN) 0.5 MG tablet Take 1 tablet (0.5 mg total) by mouth every 8 (eight) hours as needed for anxiety. 03/02/18  Yes Earlie Server, MD  metoprolol tartrate (LOPRESSOR) 25 MG tablet Take 1 tablet (25 mg total) by mouth 2 (two) times daily. 02/27/18  Yes Dustin Flock, MD  morphine (MS CONTIN) 15 MG 12 hr tablet Take 1 tablet (15 mg total) by mouth every 12 (twelve) hours. Patient taking differently: Take 15 mg by mouth every 12 (twelve) hours as needed for pain.  03/19/18  Yes Cammie Sickle, MD  Morphine Sulfate (MORPHINE CONCENTRATE) 10 mg / 0.5 ml concentrated solution Take 1 mL (20 mg total) by mouth every 4 (four) hours as needed for shortness of breath. Patient taking differently: Take 5-10 mg by mouth every 4 (four) hours as needed for shortness of breath.  03/12/18  Yes Earlie Server, MD  Multiple Vitamin (MULTIVITAMIN WITH MINERALS) TABS tablet Take 1 tablet by mouth daily.   Yes [provider]  ondansetron (ZOFRAN) 8 MG tablet Take 1 tablet (8 mg total) by mouth 2 (two) times daily as needed for refractory nausea / vomiting. 03/12/18  Yes Earlie Server, MD  osimertinib mesylate (TAGRISSO) 80 MG tablet Take 1 tablet (80 mg total) by mouth daily. 03/19/18  Yes Cammie Sickle, MD  prochlorperazine (COMPAZINE) 10 MG tablet Take 1 tablet (10 mg total) by mouth every 6 (six) hours as needed (Nausea or vomiting). 03/13/18  Yes Earlie Server, MD  senna-docusate (SENOKOT-S) 8.6-50 MG tablet Take 1 tablet by mouth daily. 02/27/18  Yes Dustin Flock, MD  tadalafil (CIALIS) 5 MG tablet Take 5 mg by mouth daily as needed for erectile dysfunction.   Yes [provider]  triamcinolone (NASACORT) 55 MCG/ACT AERO nasal inhaler Place 2 sprays into the nose daily as needed (congestion).    Yes [provider]  Vitamin D, Ergocalciferol, (DRISDOL) 50000 units CAPS capsule Take 1 capsule (50,000 Units total) by mouth every 7 (seven) days. Patient taking  differently: Take 50,000 Units by mouth every 7 (seven) days. Mondays 05/05/16  Yes Lyndal Pulley, DO  zolpidem (AMBIEN) 10 MG tablet Take 10 mg by mouth at bedtime as needed for sleep.  12/10/15  Yes [provider]  atorvastatin (LIPITOR) 40 MG tablet Take 1 tablet (40 mg total) by mouth daily at 6 PM. Patient not taking: Reported on 03/13/2018 09/08/16   Bettey Costa, MD     Family History  Problem Relation Age of Onset  . Other Paternal Uncle        Abdominal tumor     Social History   Socioeconomic History  . Marital status: Married    Spouse name: [name]   . Number of children: Not on file  . Years of education: Not on file  . Highest education level: Not on file  Occupational History  . Occupation: Software engineer  Social Needs  . Financial resource strain: Not on file  . Food insecurity:    Worry: Not on file    Inability: Not on file  . Transportation needs:  Medical: Not on file    Non-medical: Not on file  Tobacco Use  . Smoking status: Former Smoker    Packs/day: 0.50    Years: 10.00    Pack years: 5.00    Last attempt to quit: 2011    Years since quitting: 8.4  . Smokeless tobacco: Former Systems developer    Types: Snuff  Substance and Sexual Activity  . Alcohol use: Yes    Alcohol/week: 1.8 oz    Types: 3 Cans of beer per week  . Drug use: No  . Sexual activity: Yes  Lifestyle  . Physical activity:    Days per week: Not on file    Minutes per session: Not on file  . Stress: Not on file  Relationships  . Social connections:    Talks on phone: Not on file    Gets together: Not on file    Attends religious service: Not on file    Active member of club or organization: Not on file    Attends meetings of clubs or organizations: Not on file    Relationship status: Not on file  Other Topics Concern  . Not on file  Social History Narrative  . Not on file     Review of Systems: A 12 point ROS discussed and pertinent positives are indicated in the HPI  above.  All other systems are negative.  Review of Systems  Constitutional: Positive for activity change and fatigue. Negative for fever.  Respiratory: Positive for choking, chest tightness and shortness of breath. Negative for stridor.   Cardiovascular: Positive for chest pain and leg swelling.  Musculoskeletal: Positive for gait problem.  Psychiatric/Behavioral: Negative for behavioral problems and confusion.    Vital Signs: BP 132/66 (BP Location: Left Arm)   Pulse 98   Temp 97.9 F (36.6 C) (Oral)   Resp (!) 23   Ht 5\' 6"  (1.676 m)   Wt 170 lb 9.6 oz (77.4 kg)   SpO2 95%   BMI 27.54 kg/m   Physical Exam  Constitutional: He is oriented to person, place, and time.  Cardiovascular: Normal rate, regular rhythm and normal heart sounds.  Pulmonary/Chest: He has wheezes.  Decreased breath sounds RT  Abdominal: Soft. Bowel sounds are normal.  Musculoskeletal: Normal range of motion. He exhibits edema.  Rt upper extr swelling is worse than left Bilat LE swelling  Neurological: He is alert and oriented to person, place, and time.  Skin: Skin is warm and dry. There is erythema.  Psychiatric: He has a normal mood and affect. His behavior is normal. Judgment and thought content normal.  Nursing note and vitals reviewed.   Imaging: Dg Chest 2 View  Result Date: 03/12/2018 CLINICAL DATA:  RIGHT-side pleural effusion, shortness of breath RIGHT lower lobe lung cancer, former smoker EXAM: CHEST - 2 VIEW COMPARISON:  03/07/2018 FINDINGS: RIGHT jugular Port-A-Cath with tip projecting over SVC. RIGHT thoracostomy tube. Stable heart size. Loculated RIGHT pleural effusion and nodularity throughout the RIGHT hemithorax from known malignant effusion. Mediastinal shift RIGHT to LEFT increased. LEFT lung clear. No LEFT pneumothorax identified. Osseous structures unremarkable. IMPRESSION: Known RIGHT pleural carcinomatosis and malignant effusion with increased RIGHT pleural opacity and RIGHT lung  atelectasis since 03/07/2018. Increased RIGHT to LEFT midline shift. Electronically Signed   By: Lavonia Dana M.D.   On: 03/12/2018 11:35   Dg Chest 2 View  Result Date: 03/07/2018 CLINICAL DATA:  Lung carcinoma.  Cough. EXAM: CHEST - 2 VIEW COMPARISON:  March 05, 2018 FINDINGS:  Right pleural drain is again noted with the tip at the right apex level. Port-A-Cath tip is in the superior vena cava. No pneumothorax. There are areas of apparent loculated effusion as well as consolidation throughout the right lung. The left lung is clear. Heart size and pulmonary vascularity are normal. IMPRESSION: No appreciable change compared to most recent chest radiograph. Areas of loculated effusion as well as consolidation and probable underlying mases on the right with opacification of much of the right lung, stable. Left lung clear. Stable cardiac silhouette. Electronically Signed   By: Lowella Grip III M.D.   On: 03/07/2018 10:10   Dg Chest 2 View  Result Date: 03/05/2018 CLINICAL DATA:  47 year old male with shortness of breath for 2 weeks. Right lung cancer diagnosed in the past several weeks. EXAM: CHEST - 2 VIEW COMPARISON:  PET-CT 03/01/2018 and earlier. FINDINGS: Right chest IJ approach power port appear stable, not currently accessed. Superimposed right pleural drain. Regressed large right pleural effusion and patchy improved right lung ventilation since the radiographs on 02/21/2018. Residual loculated pleural fluid and/or pleural thickening. Associated thickening of the right paratracheal stripe and hilum. Left mediastinal contours remain normal. A cardiac interatrial occluded device is re-demonstrated. Small left pleural effusion is stable since 03/01/2018, otherwise the left lung is clear. Stable visualized osseous structures. Negative visible bowel gas pattern. IMPRESSION: 1. Right pleural drain in place with regressed right pleural effusion and patchy improved right lung ventilation since 02/21/2018.  Residual loculated pleural fluid and/or bulky pleural thickening. 2. Stable small layering left pleural effusion. 3. No new cardiopulmonary abnormality identified. Electronically Signed   By: Genevie Ann M.D.   On: 03/05/2018 15:27   Dg Chest 2 View  Result Date: 02/21/2018 CLINICAL DATA:  Shortness of breath and chest pain beginning 3 days ago. EXAM: CHEST - 2 VIEW COMPARISON:  07/21/2006 FINDINGS: Left hemithorax is clear. ASD closure device overlies the heart. There is a large right effusion with near complete collapse of the right lung. Partial aeration of the right upper lobe. Mediastinum bows towards the left. No significant bone finding. IMPRESSION: Large right effusion with near complete collapse of the right lung. Partial aeration of the right upper lobe. Mediastinal shift towards the left. Left chest otherwise clear. ASD closure device. Electronically Signed   By: Nelson Chimes M.D.   On: 02/21/2018 16:20   Ct Chest W Contrast  Result Date: 02/21/2018 CLINICAL DATA:  Patient with shortness of breath. History of bronchitis. Large right pleural effusion. EXAM: CT CHEST WITH CONTRAST TECHNIQUE: Multidetector CT imaging of the chest was performed during intravenous contrast administration. CONTRAST:  3mL ISOVUE-370 IOPAMIDOL (ISOVUE-370) INJECTION 76% COMPARISON:  Chest radiograph 02/21/2018 FINDINGS: Cardiovascular: Normal heart size. Trace fluid superior pericardial recess. Normal caliber aorta and main pulmonary artery. Mediastinum/Nodes: Leftward mediastinal shift secondary to large right pleural effusion. Enlarged mediastinal lymph nodes are demonstrated including a 1.3 cm prevascular lymph node (image 55; series 2). There is a 0.8 cm right paratracheal lymph node (image 52; series 2). Esophagus is poorly visualized. Lungs/Pleura: Mild leftward shift of the tracheal air column. There is a 6 mm nodule within the left lower lobe (image 121; series 3). There is a 6 mm left lower lobe nodule (image 79;  series 3). There is a 5 mm nodule in the lingula (image 55; series 3). There is a 3 mm nodule within the left upper lobe (image 41; series 3). There is a large right pleural effusion with near total collapse of the  right lung. There is minimal aeration of the right upper lobe. Multiple enhancing predominantly pleural-based nodules are demonstrated throughout the right hemithorax. There is a more dominant pleural-based nodule demonstrated within the anterior right upper hemithorax which measures up to 7 cm. Upper Abdomen: Unremarkable. Musculoskeletal: Thoracic spine degenerative changes. No aggressive or acute appearing osseous lesions. IMPRESSION: 1. Multiple enhancing pleural-based nodules are demonstrated throughout the right hemithorax with large right pleural effusion most concerning for pleural-based malignancy/metastatic disease. There is a more dominant pleural-based mass within the anterior right hemithorax. 2. Multiple enlarged mediastinal lymph nodes concerning for nodal metastatic disease. 3. Multiple nodules within the left lung concerning for the possibility of pulmonary metastatic disease. 4. These results were called by telephone at the time of interpretation on 02/21/2018 at 5:58 pm. Electronically Signed   By: Lovey Newcomer M.D.   On: 02/21/2018 18:00   Ct Angio Chest Pe W Or Wo Contrast  Result Date: 03/21/2018 CLINICAL DATA:  47 year old male with shortness of breath. Patient receiving chemotherapy for right lower lobe lung cancer. EXAM: CT ANGIOGRAPHY CHEST WITH CONTRAST TECHNIQUE: Multidetector CT imaging of the chest was performed using the standard protocol during bolus administration of intravenous contrast. Multiplanar CT image reconstructions and MIPs were obtained to evaluate the vascular anatomy. CONTRAST:  76mL ISOVUE-370 IOPAMIDOL (ISOVUE-370) INJECTION 76% COMPARISON:  None. FINDINGS: Cardiovascular: There is no cardiomegaly or pericardial effusion. An Amplatzer occlusive device of  patent foramen ovale. The thoracic aorta is unremarkable. The origins of the great vessels of the aortic arch are patent. Evaluation of the pulmonary arteries is limited due to suboptimal enhancement of the peripheral branches. No large or central pulmonary artery embolus identified. Mediastinum/Nodes: There is no adenopathy in the region of the left hilum. Evaluation of the right hilum is limited due to consolidative changes of the right lung. There is shift of the mediastinum into the left hemithorax. Mildly enlarged anterior mediastinal lymph nodes measure up to 15 mm in short axis. A Port-A-Cath is noted with tip in the region of the cavoatrial junction. Lungs/Pleura: There is a large somewhat loculated and complex right pleural effusion and pleural based masses. There is compressive atelectasis and collapse of the right lung. There is only a small aerated portion of the right upper lobe. There are ill-defined hypodense masses in the right chest involving the right lung or pleura which are suboptimally visualized and poorly evaluated. There is mass effect and shift of the mediastinum into the left hemithorax. There is extension of fluid or pleural masses posterior to the mediastinum. A right-sided pleural base tube is seen with tip in the right apical region. There is mass effect and narrowing of the right upper lobe pulmonary artery and high-grade narrowing and complete occlusion of the right mainstem bronchus. Diffuse confluent ground-glass nodular densities in the left lung primarily involving the left lower lobe and lingula most consistent with pneumonia although metastatic disease is not excluded. There is no pneumothorax. Upper Abdomen: No acute abnormality. Musculoskeletal: No chest wall abnormality. No acute or significant osseous findings. Review of the MIP images confirms the above findings. IMPRESSION: 1. No CT evidence of pulmonary embolism. 2. Large complex pleural effusion and pleural based masses  with near complete collapse of the right lung and associated mass effect and shift of the mediastinum into the left hemithorax. There is occlusion of the right mainstem bronchus secondary to mass effect and extrinsic compression. 3. Left lower lobe and lingula airspace densities most consistent with pneumonia. Clinical correlation recommended.  Electronically Signed   By: Anner Crete M.D.   On: 03/21/2018 04:46   Ct Angio Chest Pe W Or Wo Contrast  Result Date: 03/12/2018 CLINICAL DATA:  Increasing shortness of breath. EXAM: CT ANGIOGRAPHY CHEST WITH CONTRAST TECHNIQUE: Multidetector CT imaging of the chest was performed using the standard protocol during bolus administration of intravenous contrast. Multiplanar CT image reconstructions and MIPs were obtained to evaluate the vascular anatomy. CONTRAST:  61mL ISOVUE-370 IOPAMIDOL (ISOVUE-370) INJECTION 76% COMPARISON:  PET-CT dated 03/01/2018 and chest CT dated 02/21/2018 FINDINGS: Cardiovascular: There is due shift of the heart and other mediastinal structures to the left by the rapidly expanding extensive tumor in the right hemithorax. There are no pulmonary emboli. There is slight compression of the right main pulmonary artery back extensive mediastinal and hilar adenopathy. Port-A-Cath tip at the cavoatrial junction. Mediastinum/Nodes: Rapid increased extensive right hilar and mediastinal adenopathy. Rapid expansion of subcarinal adenopathy and adenopathy adjacent to the distal esophagus. Lungs/Pleura: There has been quite rapid growth of the extensive tumor in the right hemithorax as well as expansion of a cystic area in the right midzone with further compressive atelectasis of the remaining portion of the right lung. There is a small right chest tube in place. No significant residual right effusion. Left effusion has resolved since the prior study. Small new focal areas of patchy infiltrate in the anterior aspect of the left upper lobe. Upper Abdomen:  There is extensive tumor along the superior surface of the right hemidiaphragm depressing the right hemidiaphragm and the right lobe of the liver. Musculoskeletal: No chest wall abnormality. No acute or significant osseous findings. Review of the MIP images confirms the above findings. IMPRESSION: 1. Very rapid increase in extensive tumor in the right hemithorax since the PET-CT scan done 11 days ago. 2. Shift of the heart and mediastinal structures to the left with depression of the right hemidiaphragm due to the extensive tumor in the right hemithorax and in the mediastinum. 3. No pulmonary emboli. 4. Further compression of the right lung by tumor. 5. Expansion of loculated fluid in the right midzone. 6. Resolution of left pleural effusion. Critical Value/emergent results were called by telephone at the time of interpretation on 03/12/2018 at 12:11 pm to [place], NP, who verbally acknowledged these results. Electronically Signed   By: Lorriane Shire M.D.   On: 03/12/2018 12:19   Mr Jeri Cos TI Contrast  Result Date: 02/24/2018 CLINICAL DATA:  New diagnosis lung carcinoma. EXAM: MRI HEAD WITHOUT AND WITH CONTRAST TECHNIQUE: Multiplanar, multiecho pulse sequences of the brain and surrounding structures were obtained without and with intravenous contrast. CONTRAST:  54mL MULTIHANCE GADOBENATE DIMEGLUMINE 529 MG/ML IV SOLN COMPARISON:  None. FINDINGS: Brain: Diffusion imaging does not show any acute or subacute infarction. Brainstem is normal. 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 There is old infarction in the deep insula and right posterior frontal brain. Mild chronic small-vessel changes seen elsewhere within the hemispheric white matter. There is no evidence of metastatic disease. No hemorrhage, hydrocephalus or extra-axial collection. Vascular: Major vessels at the base of the brain show flow. Skull and upper cervical spine: Negative Sinuses/Orbits: Clear/normal Other: None IMPRESSION: No  evidence of metastatic disease.  Old ischemic changes as above. Electronically Signed   By: Nelson Chimes M.D.   On: 02/24/2018 11:58   Nm Pet Image Initial (pi) Skull Base To Thigh  Result Date: 03/01/2018 CLINICAL DATA:  Initial treatment strategy for right lung carcinoma. EXAM: NUCLEAR MEDICINE PET SKULL BASE  TO THIGH TECHNIQUE: 9.6 mCi F-18 FDG was injected intravenously. Full-ring PET imaging was performed from the skull base to thigh after the radiotracer. CT data was obtained and used for attenuation correction and anatomic localization. Fasting blood glucose: 86 mg/dl COMPARISON:  Chest CT on 02/21/2018 FINDINGS: (Mediastinal blood pool activity: SUV max = 1.7) NECK:  No hypermetabolic lymph nodes or masses. Incidental CT findings:  None. CHEST: No hypermetabolic lymphadenopathy. Right pleural effusion is decreased in size since previous study and right pleural drain is seen in place. Large masslike opacity in the anterior inferior right upper lobe is seen which abuts the anterior chest wall and measures approximately 12.0 x 6.5 cm on image 87/3. This has SUV max of 12.0, and is suspicious for primary bronchogenic carcinoma. Bulky hypermetabolic soft tissue density is seen throughout the right pleural space, consistent with pleural metastatic disease. Small left pleural effusion is seen, without FDG uptake. No suspicious left lung nodules or masses identified. Incidental CT findings:  None. ABDOMEN/PELVIS: No abnormal hypermetabolic activity within the liver, pancreas, adrenal glands, or spleen. No hypermetabolic lymph nodes in the abdomen or pelvis. Incidental CT findings:  Mild ascites seen, without FDG uptake. SKELETON: No focal hypermetabolic bone lesions to suggest skeletal metastasis. Incidental CT findings:  None. IMPRESSION: Large hypermetabolic masslike opacity in the anterior right upper lobe, suspicious for primary bronchogenic carcinoma. Bulky hypermetabolic soft tissue density throughout the  right pleural space, consistent with metastatic disease. No evidence of metastatic disease within the neck, abdomen, or pelvis. Electronically Signed   By: Earle Gell M.D.   On: 03/01/2018 11:28   Dg Chest Port 1 View  Result Date: 03/20/2018 CLINICAL DATA:  Recently diagnosed right upper lobe lung cancer with malignant right pleural effusion status post right VATS with talc pleurodesis. Worsening dyspnea EXAM: PORTABLE CHEST 1 VIEW COMPARISON:  03/12/2018 chest radiograph. FINDINGS: Right internal jugular MediPort terminates at the cavoatrial junction. Right apical chest tube is in place. Worsening near complete opacification of the right hemithorax with minimal residual aeration in the right upper lung. Worsening marked circumferential right pleural thickening/effusion. Worsened left mediastinal shift. Normal heart size. Stable mediastinal contour. No pneumothorax. No left pleural effusion. Increased patchy left retrocardiac opacity. IMPRESSION: 1. Worsening near complete opacification of the right hemithorax with minimal residual aeration in the right upper lung and worsening diffuse marked right pleural thickening/effusion. 2. Worsening left mediastinal shift with increased patchy left retrocardiac opacity, probably atelectasis. Electronically Signed   By: Ilona Sorrel M.D.   On: 03/20/2018 21:19   Dg Chest Port 1 View  Result Date: 02/26/2018 CLINICAL DATA:  Chest tube in place.  Lung cancer. EXAM: PORTABLE CHEST 1 VIEW COMPARISON:  One-view chest x-ray 02/23/2018 FINDINGS: Heart size is normal. A right IJ Port-A-Cath is stable. Two right-sided chest tubes are stable in position. Interstitial and airspace disease is increasing on the right. A right pleural effusion is suspected. The left lung is clear. The visualized soft tissues and bony thorax are unremarkable. No residual pneumothorax is present. IMPRESSION: 1. Increasing right pleural effusion and airspace disease. 2. PleurX catheter and right-sided  chest tube stable in position. 3. Right IJ catheter stable. Electronically Signed   By: San Morelle M.D.   On: 02/26/2018 07:17   Dg Chest Port 1 View  Result Date: 02/23/2018 CLINICAL DATA:  Postoperative chest x-ray.  Tube placements. EXAM: PORTABLE CHEST 1 VIEW COMPARISON:  02/22/2018. FINDINGS: PowerPort catheter with tip over cavoatrial junction. Two right chest tubes noted with tip  over the right upper chest. Significant reduction in right-sided pleural effusion with mild residual. No pneumothorax. Atelectatic changes are noted in the right upper and lower lungs. Follow-up exams to demonstrate resolution suggested. Heart size normal. No acute bony abnormality. IMPRESSION: 1.  PowerPort catheter noted with tip at the cavoatrial junction. 2. Two right chest tubes noted. Significant reduction in right-sided pleural effusion with mild residual. Atelectatic changes noted the right upper and lower lungs. Follow-up exams demonstrate resolution suggested. Electronically Signed   By: Marcello Moores  Register   On: 02/23/2018 16:10   Dg Chest Port 1 View  Result Date: 02/22/2018 CLINICAL DATA:  Post right-sided thoracentesis EXAM: PORTABLE CHEST 1 VIEW COMPARISON:  02/21/2018; chest CT-02/21/2018 FINDINGS: Interval reduction in persistent moderate-sized right-sided effusion post thoracentesis. No pneumothorax. Slight reduction in previously noted right to left mediastinal shift. Otherwise, unchanged cardiac silhouette and mediastinal contours with persistent obscuration of the right heart border secondary to right basilar consolidative opacities. Post PFO ligation. No new focal airspace opacities. The left hemithorax remains well aerated. No acute osseus abnormalities. IMPRESSION: Interval reduction in persistent moderate-sized right-sided effusion post thoracentesis. No pneumothorax. Electronically Signed   By: Sandi Mariscal M.D.   On: 02/22/2018 10:41   Dg C-arm 1-60 Min-no Report  Result Date:  02/23/2018 Fluoroscopy was utilized by the requesting physician.  No radiographic interpretation.   US Thoracentesis Asp Pleural Space W/img Guide  Result Date: 02/22/2018 INDICATION: No known primary, now with multiple pleural based masses and symptomatic right-sided pleural effusion. Please perform ultrasound-guided thoracentesis for diagnostic and therapeutic purposes. EXAM: US THORACENTESIS ASP PLEURAL SPACE W/IMG GUIDE COMPARISON:  Chest radiograph - 02/21/2018; chest CT - 02/21/2018 MEDICATIONS: None. COMPLICATIONS: None immediate. TECHNIQUE: Informed written consent was obtained from the patient after a discussion of the risks, benefits and alternatives to treatment. A timeout was performed prior to the initiation of the procedure. Initial ultrasound scanning demonstrates a large anechoic right-sided pleural effusion. The lower chest was prepped and draped in the usual sterile fashion. 1% lidocaine was used for local anesthesia. An ultrasound image was saved for documentation purposes. An 8 Fr Safe-T-Centesis catheter was introduced. The thoracentesis was performed. The catheter was removed and a dressing was applied. The patient tolerated the procedure well without immediate post procedural complication. The patient was escorted to have an upright chest radiograph. FINDINGS: A total of approximately 2 liters of serous, slightly blood tinged fluid was removed. Requested samples were sent to the laboratory. IMPRESSION: Successful ultrasound-guided right sided thoracentesis yielding 2 liters of pleural fluid. Electronically Signed   By: Sandi Mariscal M.D.   On: 02/22/2018 11:06    Labs:  CBC: Recent Labs    03/12/18 1115 03/19/18 1425 03/20/18 2045 03/21/18 0500  WBC 10.3 5.3 7.2 6.4  HGB 12.2* 11.5* 12.0* 11.2*  HCT 35.7* 33.2* 34.2* 33.1*  PLT 390 182 206 198    COAGS: Recent Labs    02/23/18 0237 03/21/18 0520  INR 1.05 1.16  APTT 29  --     BMP: Recent Labs    03/12/18 1115  03/19/18 1425 03/20/18 2045 03/21/18 0500  NA 135 131* 129* 131*  K 4.1 4.0 3.7 3.7  CL 103 99* 95* 96*  CO2 24 23 24 26   GLUCOSE 114* 119* 127* 111*  BUN 18 21* 23* 18  CALCIUM 9.4 9.3 9.3 9.6  CREATININE 1.10 0.91 0.82 0.88  GFRNONAA >60 >60 >60 >60  GFRAA >60 >60 >60 >60    LIVER FUNCTION TESTS: Recent Labs  02/23/18 0237 03/12/18 1115 03/19/18 1425 03/20/18 2100  BILITOT 0.4 0.7 0.7 0.5  AST 24 26 35 35  ALT 20 28 41 40  ALKPHOS 54 58 45 44  PROT 5.8* 5.6* 5.3* 5.3*  ALBUMIN 3.0* 2.8* 2.7* 2.7*    TUMOR MARKERS: No results for input(s): AFPTM, CEA, CA199, CHROMGRNA in the last 8760 hours.  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  All of the patient's questions were answered, patient is agreeable to proceed. Consent signed and in chart.   Thank you for this interesting consult.  I greatly enjoyed meeting Ruben Eaton and look forward to participating in their care.  A copy of this report was sent to the requesting provider on this date.  Electronically Signed: [name], PA-C 03/21/2018, 3:22 PM   I spent a total of 40 Minutes    in face to face in clinical consultation, greater than 50% of which was counseling/coordinating care for right chest tube placement

## 2018-03-21 NOTE — Progress Notes (Addendum)
PATIENT ARRIVED FROM [facility] ON STRETCHER; WALKED TO ROOM CHAIR. ATTACHED TO 4L [place] AND  ROOM MONITOR. SO SIGNIFICANT DISTRESS NOTED. 2 % CHLORHEXIDINE WIPES USED TO BATHE PATIENT, RIGHT FLANK DRESSING INTACT, NO DRAINAGE NOTED. PICK SACRUM FOAM PLACED; NO SKIN BREAKDOWN NOTED. RT CHEST PORT-A-CATH AND RT AC 20 G NOTED. [facility]YS[facility] NOTIFIED OF PATIENTS ARRIVAL.

## 2018-03-21 NOTE — Progress Notes (Signed)
Dr Bonner Puna paged regarding transferring patient to [facility]

## 2018-03-21 NOTE — Progress Notes (Addendum)
Summary of the events of the day:  Patient transferred to the ICU for impending respiratory failure from pleural effusion  Communicated with CVTS, not a surgical candidate since patient had pleurex that is not functional and talc placed.  Communicated with IR, films all reviewed, concern is this may be a pleural effusion vs a rapidly growing mass.  Difficult to ascertain.   Placed U/S probe on patient's chest, only one spot superior and posterior that has fluid and multiple loculation noted.  Options discussed with patient, he was unable to decide and asked for recommendations.  Went down to IR again, Dr. Corrie Mckusick was agreeable to attempt and place a CT guided tube.  Met with Dr. 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  Asked about radiation options and that appears to be a possiblity.  Communicated with Dr. Kae Heller from radiation oncology, agreeable to radiate patient tonight but will need to be transferred to [facility].  Communicated with Dr. Benay Spice and Dr. Kae Heller again, will radiate and start biologics once transferred.  Communicated with family, agreeable with plan.  Communicated with Dr. Bonner Puna as patient's primary, once back from IR, will transfer patient to [facility] on [facility] service.  Communicated with charge RN, once patient arrives, Dr. Bonner Puna is to be notified to transfer patient and upon arrival to [facility] then bedside RN there is to notify radiation oncology Dr. Kae Heller of arrival to begin radiation.  Per discussion with patient, no CPR/cardioversion/Trach/PEG but short term intubation and pressors are acceptable, will adjust code status.  PCCM will sign off.  The patient is critically ill with multiple organ systems failure and requires high complexity decision making for assessment and support, frequent evaluation and titration of therapies, application of advanced monitoring technologies and extensive interpretation of  multiple databases.   Critical Care Time devoted to patient care services described in this note is  60  Minutes. This time reflects time of care of this signee Dr Jennet Maduro. This critical care time does not reflect procedure time, or teaching time or supervisory time of PA/NP/Med student/Med Resident etc but could involve care discussion time.  Rush Farmer, M.D. [facility] Pulmonary/Critical Care Medicine. Pager: [phone]. After hours pager: [phone].

## 2018-03-21 NOTE — Consult Note (Addendum)
[address] E[address]       Ruben Eaton,Ruben Eaton [address]             [phone]        Shin S Mccartt [place] Medical Record #[id number] Date of Birth: 1971/07/27  Referring: No ref. provider found Primary Care: Dion Body, MD Primary Cardiologist:Ruben [name], MD  Chief Complaint:  Shortness of breath and lower/upper extremity edema  History of Present Illness:      Ruben Eaton is a 47 year old male patient with a past medical history significant for non-small cell lung cancer with rapid progression, former smoker, cryptogenic stoke with PFO s/Ruben closure,  Hypertension, and sleep apnea who was recently diagnosed with a pleural effusion. He had a right VATs with talc pleurodesis done and a pleurx catheter was placed. At this time he was found to hae non-small cell lung cancer with rapid progression and was started on chemotherapy. The patient then started developing worsening shortness of breath with increasing peripheral edema. His chemotherapy was stopped at this time. He does have orthopnea but denies chest pain, fever, chills, and abdominal pain. He has had some nausea. CXR in the ED showed near opacification of the right hemithorax and worsening mediastinal shift. Subsequent CT scan of the chest revealed no evidence of a pulmonary embolism, a large complex pleural effusion and pleural based masses with near complete collapse of the right lung and associated mass effect and shift of the mediastinum into the left hemithorax. There is also occlusion of the right mainstem bronchus. There are also  left lower lobe and lingula airspace densities most consistent with pneumonia. The patient has been started on IV Vanc and Zosyn for broad spectrum antibiotic coverage.    Current Activity/ Functional Status: Patient was independent with mobility/ambulation, transfers, ADL's, IADL's.   Zubrod Score: At the time of surgery this patient's most appropriate activity status/level  should be described as: []     0    Normal activity, no symptoms []     1    Restricted in physical strenuous activity but ambulatory, able to do out light work [x]     2    Ambulatory and capable of self care, unable to do work activities, up and about                 more than 50%  Of the time                            []     3    Only limited self care, in bed greater than 50% of waking hours []     4    Completely disabled, no self care, confined to bed or chair []     5    Moribund  Past Medical History:  Diagnosis Date  . 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 09/15/2016 in setting of PFO.  . CVA (cerebral vascular accident) ([place]) 09/07/2016  . ED (erectile dysfunction)   . Essential (primary) hypertension 02/01/2016  . History of stroke 10/08/2016  . Hx of insomnia   . Hypertension   . OSA on CPAP   . PFO (patent foramen ovale)    a. s/Ruben closure 09/2016 by Ruben. Burt Eaton.  . Pleural effusion on right 02/21/2018  . Primary cancer of right lower lobe of lung ([facility]) 02/25/2018  . PVC's (premature ventricular contractions) 10/08/2016  . 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  TIA (transient ischemic attack) 09/07/2016    Past Surgical History:  Procedure Laterality Date  . APPENDECTOMY  1980  . EYE SURGERY    . PATENT FORAMEN OVALE CLOSURE  10/07/2016  . PATENT FORAMEN OVALE CLOSURE N/A 10/07/2016   Procedure: Patent [place](PFO) Closure;  Surgeon: [name], MD;  Location: [facility];  Service: Cardiovascular;  Laterality: N/A;  . PORTACATH PLACEMENT N/A 02/23/2018   Procedure: INSERTION PORT-A-CATH;  Surgeon: Nestor Lewandowsky, MD;  Location: [facility];  Service: Thoracic;  Laterality: N/A;  . STRABISMUS SURGERY Left 1984  . TEE WITHOUT CARDIOVERSION N/A 09/08/2016   Procedure: TRANSESOPHAGEAL ECHOCARDIOGRAM (TEE);  Surgeon: [name], MD;  Location: [facility];  Service: Cardiovascular;  Laterality: N/A;  . VIDEO ASSISTED THORACOSCOPY (VATS) W/TALC  PLEUADESIS Right 02/23/2018   Procedure: VIDEO ASSISTED THORACOSCOPY (VATS) W/TALC PLEUADESIS;  Surgeon: Nestor Lewandowsky, MD;  Location: [facility];  Service: Thoracic;  Laterality: Right;  . VITRECTOMY Right 2013    Social History   Tobacco Use  Smoking Status Former Smoker  . Packs/day: 0.50  . Years: 10.00  . Pack years: 5.00  . Last attempt to quit: 2011  . Years since quitting: 8.4  Smokeless Tobacco Former Systems developer  . Types: Snuff    Social History   Substance and Sexual Activity  Alcohol Use Yes  . Alcohol/week: 1.8 oz  . Types: 3 Cans of beer per week     No Known Allergies  Current Facility-Administered Medications  Medication Dose Route Frequency Provider Last Rate Last Dose  . 0.9 %  sodium chloride infusion   Intravenous PRN Patrecia Pour, MD      . acetaminophen (TYLENOL) tablet 650 mg  650 mg Oral Q6H PRN Rise Patience, MD       Or  . acetaminophen (TYLENOL) suppository 650 mg  650 mg Rectal Q6H PRN Rise Patience, MD      . aspirin EC tablet 81 mg  81 mg Oral Daily Rise Patience, MD      . MEDLINE mouth rinse  15 mL Mouth Rinse BID Rise Patience, MD      . metoprolol tartrate (LOPRESSOR) tablet 25 mg  25 mg Oral BID Rise Patience, MD   25 mg at 03/21/18 1610  . morphine (MS CONTIN) 12 hr tablet 15 mg  15 mg Oral Q12H Rise Patience, MD      . morphine CONCENTRATE 10 MG/0.5ML oral solution 20 mg  20 mg Oral Q4H PRN Rise Patience, MD   20 mg at 03/21/18 0345  . multivitamin with minerals tablet 1 tablet  1 tablet Oral Daily Rise Patience, MD   1 tablet at 03/21/18 [phone]  . ondansetron (ZOFRAN) tablet 4 mg  4 mg Oral Q6H PRN Rise Patience, MD       Or  . ondansetron [facility]) injection 4 mg  4 mg Intravenous Q6H PRN Rise Patience, MD      . osimertinib mesylate (TAGRISSO) tablet 80 mg  80 mg Oral Daily Vance Gather B, MD      . piperacillin-tazobactam (ZOSYN) IVPB 3.375 g  3.375 g Intravenous Q8H  Rise Patience, MD      . prochlorperazine (COMPAZINE) tablet 10 mg  10 mg Oral Q6H PRN Rise Patience, MD      . senna-docusate (Senokot-S) tablet 1 tablet  1 tablet Oral Daily Rise Patience, MD   1 tablet at 03/21/18 [phone]  .  vancomycin (VANCOCIN) IVPB 750 mg/150 ml premix  750 mg Intravenous Q8H Rise Patience, MD        Medications Prior to Admission  Medication Sig Dispense Refill Last Dose  . aspirin EC 81 MG EC tablet Take 1 tablet (81 mg total) by mouth daily. 120 tablet 0 03/19/2018 at Unknown time  . atorvastatin (LIPITOR) 40 MG tablet Take 1 tablet (40 mg total) by mouth daily at 6 PM. (Patient not taking: Reported on 03/13/2018) 30 tablet 0 Not Taking at Unknown time  . b complex vitamins capsule Take 1 capsule by mouth daily.    Past Week at Unknown time  . B Complex-C (B-COMPLEX WITH VITAMIN C) tablet Take 1 tablet by mouth daily.   03/19/2018 at Unknown time  . dexamethasone (DECADRON) 4 MG tablet Take 2 tablets (8 mg total) by mouth daily. Start the day after chemotherapy for 2 days. 30 tablet 1 Past Week at Unknown time  . Diclofenac Sodium 2 % SOLN Apply 1 pump twice daily as needed. (Patient taking differently: Apply 1 application topically 2 (two) times daily as needed (pain). Apply 1 pump twice daily as needed.) 112 g 3 prn at prn  . levofloxacin (LEVAQUIN) 500 MG tablet Take 1 tablet (500 mg total) by mouth daily. (Patient not taking: Reported on 03/13/2018) 7 tablet 0 Not Taking at Unknown time  . loratadine (CLARITIN) 10 MG tablet Take 10 mg by mouth daily.    03/19/2018 at Unknown time  . LORazepam (ATIVAN) 0.5 MG tablet Take 1 tablet (0.5 mg total) by mouth every 8 (eight) hours as needed for anxiety. 30 tablet 0 03/19/2018 at Unknown time  . metoprolol tartrate (LOPRESSOR) 25 MG tablet Take 1 tablet (25 mg total) by mouth 2 (two) times daily. 60 tablet 2 03/20/2018 at 0800  . morphine (MS CONTIN) 15 MG 12 hr tablet Take 1 tablet (15 mg total) by mouth  every 12 (twelve) hours. 60 tablet 0 03/19/2018 at Unknown time  . Morphine Sulfate (MORPHINE CONCENTRATE) 10 mg / 0.5 ml concentrated solution Take 1 mL (20 mg total) by mouth every 4 (four) hours as needed for shortness of breath. 118 mL 0 03/20/2018 at 1300  . Multiple Vitamin (MULTIVITAMIN WITH MINERALS) TABS tablet Take 1 tablet by mouth daily.   Past Week at Unknown time  . ondansetron (ZOFRAN) 8 MG tablet Take 1 tablet (8 mg total) by mouth 2 (two) times daily as needed for refractory nausea / vomiting. 30 tablet 1 prn at prn  . osimertinib mesylate (TAGRISSO) 80 MG tablet Take 1 tablet (80 mg total) by mouth daily. (Patient not taking: Reported on 03/20/2018) 30 tablet 5 Not Taking at Unknown time  . prochlorperazine (COMPAZINE) 10 MG tablet Take 1 tablet (10 mg total) by mouth every 6 (six) hours as needed (Nausea or vomiting). 30 tablet 1 03/20/2018 at 1800  . senna-docusate (SENOKOT-S) 8.6-50 MG tablet Take 1 tablet by mouth daily. 30 tablet 0 Past Week at Unknown time  . tadalafil (CIALIS) 5 MG tablet Take 5 mg by mouth daily as needed for erectile dysfunction.   prn at prn  . triamcinolone (NASACORT) 55 MCG/ACT AERO nasal inhaler Place 2 sprays into the nose daily as needed (congestion).    Past Week at Unknown time  . Vitamin D, Ergocalciferol, (DRISDOL) 50000 units CAPS capsule Take 1 capsule (50,000 Units total) by mouth every 7 (seven) days. (Patient taking differently: Take 50,000 Units by mouth every 7 (seven) days. Mondays) 12  capsule 0 Past Week at Unknown time  . zolpidem (AMBIEN) 10 MG tablet Take 10 mg by mouth at bedtime as needed for sleep.    Past Week at Unknown time    Family History  Problem Relation Age of Onset  . Other Paternal Uncle        Abdominal tumor      Review of Systems:   Review of Systems  Constitutional: Negative for fever.  HENT: Positive for congestion.   Respiratory: Positive for cough and shortness of breath.   Cardiovascular: Positive for  orthopnea and leg swelling. Negative for chest pain.  Gastrointestinal: Positive for nausea.  Psychiatric/Behavioral: Positive for depression.   Pertinent items are noted in HPI.     Physical Exam: BP 121/79 (BP Location: Left Arm)   Pulse (!) 107   Temp 98.3 F (36.8 C) (Oral)   Resp 18   Ht 5\' 6"  (1.676 m)   Wt 77.4 kg (170 lb 9.6 oz)   SpO2 95%   BMI 27.54 kg/m    General appearance: alert, cooperative and mild distress Resp: diminished breath sounds, diffuse rhonchi Cardio: irregular, no murmur GI: soft, non-tender; bowel sounds normal; no masses,  no organomegaly Extremities: 3-4+ bilaterall pitting edema in lower legs and hands/arms Neurologic: Grossly normal  Diagnostic Studies & Laboratory data:     Recent Radiology Findings:   Ct Angio Chest Pe W Or Wo Contrast  Result Date: 03/21/2018 CLINICAL DATA:  47 year old male with shortness of breath. Patient receiving chemotherapy for right lower lobe lung cancer. EXAM: CT ANGIOGRAPHY CHEST WITH CONTRAST TECHNIQUE: Multidetector CT imaging of the chest was performed using the standard protocol during bolus administration of intravenous contrast. Multiplanar CT image reconstructions and MIPs were obtained to evaluate the vascular anatomy. CONTRAST:  60mL ISOVUE-370 IOPAMIDOL (ISOVUE-370) INJECTION 76% COMPARISON:  None. FINDINGS: Cardiovascular: There is no cardiomegaly or pericardial effusion. An Amplatzer occlusive device of patent foramen ovale. The thoracic aorta is unremarkable. The origins of the great vessels of the aortic arch are patent. Evaluation of the pulmonary arteries is limited due to suboptimal enhancement of the peripheral branches. No large or central pulmonary artery embolus identified. Mediastinum/Nodes: There is no adenopathy in the region of the left hilum. Evaluation of the right hilum is limited due to consolidative changes of the right lung. There is shift of the mediastinum into the left hemithorax. Mildly  enlarged anterior mediastinal lymph nodes measure up to 15 mm in short axis. A Port-A-Cath is noted with tip in the region of the cavoatrial junction. Lungs/Pleura: There is a large somewhat loculated and complex right pleural effusion and pleural based masses. There is compressive atelectasis and collapse of the right lung. There is only a small aerated portion of the right upper lobe. There are ill-defined hypodense masses in the right chest involving the right lung or pleura which are suboptimally visualized and poorly evaluated. There is mass effect and shift of the mediastinum into the left hemithorax. There is extension of fluid or pleural masses posterior to the mediastinum. A right-sided pleural base tube is seen with tip in the right apical region. There is mass effect and narrowing of the right upper lobe pulmonary artery and high-grade narrowing and complete occlusion of the right mainstem bronchus. Diffuse confluent ground-glass nodular densities in the left lung primarily involving the left lower lobe and lingula most consistent with pneumonia although metastatic disease is not excluded. There is no pneumothorax. Upper Abdomen: No acute abnormality. Musculoskeletal: No chest wall  abnormality. No acute or significant osseous findings. Review of the MIP images confirms the above findings. IMPRESSION: 1. No CT evidence of pulmonary embolism. 2. Large complex pleural effusion and pleural based masses with near complete collapse of the right lung and associated mass effect and shift of the mediastinum into the left hemithorax. There is occlusion of the right mainstem bronchus secondary to mass effect and extrinsic compression. 3. Left lower lobe and lingula airspace densities most consistent with pneumonia. Clinical correlation recommended. Electronically Signed   By: Anner Crete M.D.   On: 03/21/2018 04:46   Dg Chest Port 1 View  Result Date: 03/20/2018 CLINICAL DATA:  Recently diagnosed right  upper lobe lung cancer with malignant right pleural effusion status post right VATS with talc pleurodesis. Worsening dyspnea EXAM: PORTABLE CHEST 1 VIEW COMPARISON:  03/12/2018 chest radiograph. FINDINGS: Right internal jugular MediPort terminates at the cavoatrial junction. Right apical chest tube is in place. Worsening near complete opacification of the right hemithorax with minimal residual aeration in the right upper lung. Worsening marked circumferential right pleural thickening/effusion. Worsened left mediastinal shift. Normal heart size. Stable mediastinal contour. No pneumothorax. No left pleural effusion. Increased patchy left retrocardiac opacity. IMPRESSION: 1. Worsening near complete opacification of the right hemithorax with minimal residual aeration in the right upper lung and worsening diffuse marked right pleural thickening/effusion. 2. Worsening left mediastinal shift with increased patchy left retrocardiac opacity, probably atelectasis. Electronically Signed   By: Ilona Sorrel M.D.   On: 03/20/2018 21:19     I have independently reviewed the above radiologic studies and discussed with the patient   Recent Lab Findings: Lab Results  Component Value Date   WBC 6.4 03/21/2018   HGB 11.2 (L) 03/21/2018   HCT 33.1 (L) 03/21/2018   PLT 198 03/21/2018   GLUCOSE 111 (H) 03/21/2018   CHOL 228 (H) 09/07/2016   TRIG 44 09/07/2016   HDL 108 09/07/2016   LDLCALC 111 (H) 09/07/2016   ALT 40 03/20/2018   AST 35 03/20/2018   NA 131 (L) 03/21/2018   K 3.7 03/21/2018   CL 96 (L) 03/21/2018   CREATININE 0.88 03/21/2018   BUN 18 03/21/2018   CO2 26 03/21/2018   TSH 2.501 02/26/2018   INR 1.16 03/21/2018   HGBA1C 5.3 09/07/2016      Assessment / Plan:       1. Complex right pleural effusion with mediastinal shift/occlusion of the right mainstem bronchus-Continue current medical treatment. S/Ruben right VATs with talc pleurodesis with Ruben. Genevive Eaton on 02/23/2018. Pleurx catheter in place and now  not draining. He would be a poor surgical candidate for re-do VATs. Of note the patient mentions that Ruben. Genevive Eaton did not do a decortication. Possibly could do a bronch if patient intubated to release mucous plug. CT scan of the chest and chest xray listed above.  2. HCAP- continue broad spectrum antibiotics until specific organism is identified.  3. Sleep apnea-on CPAP 4. Hypertension- continue home medication regimen 5. NSR with PVCs-continue metoprolol 6. Hx cryptogenic stroke with PFO s/Ruben closure-continue ASA 7. Recent echocardiogram reviewed (02/26/2018)-LVEF is 55%-60%. No valvular abnormalities.  8. Significant upper and lower extremity edema-medicine service is holding off on lasix due to preload dependence with his mediastinal shift.  9. Consider palliative care consult, would consider transfer to ICU for critical care.  Ruben Rough, PA-C  03/21/2018 9:52 AM   CT scan, xray images personally reviewed and patient discussed with T. Conte,PA and  Ruben Eaton for coordination of  care. With imaging evidence that there is no fluid to drain from the R pleural space and the previously placed Pleurx cath  not draining  There are no surgical options and direct antineoplastic therapy to the rapidly advancing malignancy in the R chest appears to be the only palliative therapy that could help this unfortunate patient.   Agree with above note and assessment  Ruben Prescott Gum MD

## 2018-03-21 NOTE — Consult Note (Addendum)
PULMONARY / CRITICAL CARE MEDICINE   Name: Ruben Eaton MRN: [id number] DOB: July 21, 1971    ADMISSION DATE:  03/21/2018 CONSULTATION DATE: 03/21/2018  REFERRING MD: [facility]  CHIEF COMPLAINT: Shortness of breath  HISTORY OF PRESENT ILLNESS:    His past medical history is significant for a right MCA infarct secondary to patent foramen ovale and embolic event.  He has very little residual from that.  He had a transcatheter closure of his patent foramen ovale.  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  He was the hospital had a CT scan revealed right pleural effusion for which he underwent thoracentesis.  Surgical pathology revealed non-small cell lung cancer.  He underwent 1 round of chemotherapy with (ALOXI)(UDENYCA)(PARAPLATIN) (TAXOL).  Further he had a right Pleurx catheter placed which drained a moderate amount of fluid.  He has proved refractory to chemotherapy and represented to [facility] hospital 03/20/2018 with increasing shortness of breath and upper and lower extremities edema.  Repeat CT scan was negative for pulmonary embolism but revealed progression of tumor with compression of the right mainstem bronchus and shunting of the trachea to the left.  03/21/2018 pulmonary critical care was called to the bedside for consult.  He was noted to be tripoding in acute respiratory distress.  Due to his increased respiratory distress he was transferred to the intensive care unit.  At the time of this note cardiovascular thoracic surgery was a seen for questionable emergency debulking of the right tumor.  He expressed he would not want long-term intubation but short-term intubation would be acceptable he would not want to have cardiopulmonary resuscitation in case of a code.  Definitely would not want to be trached impact in the future.    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  PAST SURGICAL HISTORY: He  has a past surgical history that includes TEE without cardioversion (N/A, 09/08/2016); Appendectomy (1980); Strabismus surgery (Left, 1984); Vitrectomy (Right, 2013); Patent foramen ovale closure (10/07/2016); Eye surgery; Patent foramen ovale closure (N/A, 10/07/2016); Video assisted thoracoscopy (vats) w/talc pleuadesis (Right, 02/23/2018); and Portacath placement (N/A, 02/23/2018).  No Known Allergies  No current facility-administered medications on file prior to encounter.    Current Outpatient Medications on File Prior to Encounter  Medication Sig  . aspirin EC 81 MG EC tablet Take 1 tablet (81 mg total) by mouth daily.  Marland Kitchen atorvastatin (LIPITOR) 40 MG tablet Take 1 tablet (40 mg total) by mouth daily at 6 PM. (Patient not taking: Reported on 03/13/2018)  . b complex vitamins capsule Take 1 capsule by mouth daily.   . B Complex-C (B-COMPLEX WITH VITAMIN C) tablet Take 1 tablet by mouth daily.  Marland Kitchen dexamethasone (DECADRON) 4 MG tablet Take 2 tablets (8 mg total) by mouth daily. Start the day after chemotherapy for 2 days.  . Diclofenac Sodium 2 % SOLN Apply 1 pump twice daily as needed. (Patient taking differently: Apply 1 application topically 2 (two) times daily as needed (pain). Apply 1 pump twice daily as needed.)  .  levofloxacin (LEVAQUIN) 500 MG tablet Take 1 tablet (500 mg total) by mouth daily. (Patient not taking: Reported on 03/13/2018)  . loratadine  (CLARITIN) 10 MG tablet Take 10 mg by mouth daily.   Marland Kitchen LORazepam (ATIVAN) 0.5 MG tablet Take 1 tablet (0.5 mg total) by mouth every 8 (eight) hours as needed for anxiety.  . metoprolol tartrate (LOPRESSOR) 25 MG tablet Take 1 tablet (25 mg total) by mouth 2 (two) times daily.  Marland Kitchen morphine (MS CONTIN) 15 MG 12 hr tablet Take 1 tablet (15 mg total) by mouth every 12 (twelve) hours.  . Morphine Sulfate (MORPHINE CONCENTRATE) 10 mg / 0.5 ml concentrated solution Take 1 mL (20 mg total) by mouth every 4 (four) hours as needed for shortness of breath.  . Multiple Vitamin (MULTIVITAMIN WITH MINERALS) TABS tablet Take 1 tablet by mouth daily.  . ondansetron (ZOFRAN) 8 MG tablet Take 1 tablet (8 mg total) by mouth 2 (two) times daily as needed for refractory nausea / vomiting.  Marland Kitchen osimertinib mesylate (TAGRISSO) 80 MG tablet Take 1 tablet (80 mg total) by mouth daily. (Patient not taking: Reported on 03/20/2018)  . prochlorperazine (COMPAZINE) 10 MG tablet Take 1 tablet (10 mg total) by mouth every 6 (six) hours as needed (Nausea or vomiting).  Marland Kitchen senna-docusate (SENOKOT-S) 8.6-50 MG tablet Take 1 tablet by mouth daily.  . tadalafil (CIALIS) 5 MG tablet Take 5 mg by mouth daily as needed for erectile dysfunction.  . triamcinolone (NASACORT) 55 MCG/ACT AERO nasal inhaler Place 2 sprays into the nose daily as needed (congestion).   . Vitamin D, Ergocalciferol, (DRISDOL) 50000 units CAPS capsule Take 1 capsule (50,000 Units total) by mouth every 7 (seven) days. (Patient taking differently: Take 50,000 Units by mouth every 7 (seven) days. Mondays)  . zolpidem (AMBIEN) 10 MG tablet Take 10 mg by mouth at bedtime as needed for sleep.     FAMILY HISTORY:  His indicated that his mother is alive. He indicated that his father is deceased. He indicated that his sister is alive. He indicated that his paternal uncle is alive.   SOCIAL HISTORY: He  reports that he quit smoking about 8 years ago. He has a 5.00 pack-year  smoking history. He has quit using smokeless tobacco. His smokeless tobacco use included snuff. He reports that he drinks about 1.8 oz of alcohol per week. He reports that he does not use drugs.  REVIEW OF SYSTEMS:   Taken extensively see HPI  SUBJECTIVE:  47 year old male with recent diagnosis of non-small cell lung cancer with increasing tumor load despite interventions of chemotherapy.  He is in obvious respiratory distress from the transfer to the intensive care unit.  Questionable if we have any interventional treatments off have other than comfort care.  VITAL SIGNS: BP 121/79 (BP Location: Left Arm)   Pulse (!) 107   Temp 98.3 F (36.8 C) (Oral)   Resp 18   Ht 5\' 6"  (1.676 m)   Wt 77.4 kg (170 lb 9.6 oz)   SpO2 95%   BMI 27.54 kg/m   HEMODYNAMICS:    VENTILATOR SETTINGS:    INTAKE / OUTPUT: I/O last 3 completed shifts: In: 100 [IV Piggyback:100] Out: -   PHYSICAL EXAMINATION: General: Well-nourished well-developed male in obvious distress. Neuro: Currently intact moves all extremities follows commands HEENT: No JVD or lymphadenopathy is appreciated Cardiovascular: Heart sounds are regular regular rate and rhythm Lungs: Decreased breath sounds on the right dull to percussion on the right, left lung  with coarse rhonchi Abdomen: Abdomen soft nontender Musculoskeletal: Intact Skin: Bilateral arms bilateral legs with 3+ pitting edema  LABS:  BMET Recent Labs  Lab 03/19/18 1425 03/20/18 2045 03/21/18 0500  NA 131* 129* 131*  K 4.0 3.7 3.7  CL 99* 95* 96*  CO2 23 24 26   BUN 21* 23* 18  CREATININE 0.91 0.82 0.88  GLUCOSE 119* 127* 111*    Electrolytes Recent Labs  Lab 03/19/18 1425 03/20/18 2045 03/21/18 0500  CALCIUM 9.3 9.3 9.6    CBC Recent Labs  Lab 03/19/18 1425 03/20/18 2045 03/21/18 0500  WBC 5.3 7.2 6.4  HGB 11.5* 12.0* 11.2*  HCT 33.2* 34.2* 33.1*  PLT 182 206 198    Coag's Recent Labs  Lab 03/21/18 0520  INR 1.16    Sepsis  Markers No results for input(s): LATICACIDVEN, PROCALCITON, O2SATVEN in the last 168 hours.  ABG No results for input(s): PHART, PCO2ART, PO2ART in the last 168 hours.  Liver Enzymes Recent Labs  Lab 03/19/18 1425 03/20/18 2100  AST 35 35  ALT 41 40  ALKPHOS 45 44  BILITOT 0.7 0.5  ALBUMIN 2.7* 2.7*    Cardiac Enzymes Recent Labs  Lab 03/20/18 2045  TROPONINI <0.03    Glucose Recent Labs  Lab 03/21/18 0553  GLUCAP 105*    Imaging Ct Angio Chest Pe W Or Wo Contrast  Result Date: 03/21/2018 CLINICAL DATA:  47 year old male with shortness of breath. Patient receiving chemotherapy for right lower lobe lung cancer. EXAM: CT ANGIOGRAPHY CHEST WITH CONTRAST TECHNIQUE: Multidetector CT imaging of the chest was performed using the standard protocol during bolus administration of intravenous contrast. Multiplanar CT image reconstructions and MIPs were obtained to evaluate the vascular anatomy. CONTRAST:  94mL ISOVUE-370 IOPAMIDOL (ISOVUE-370) INJECTION 76% COMPARISON:  None. FINDINGS: Cardiovascular: There is no cardiomegaly or pericardial effusion. An Amplatzer occlusive device of patent foramen ovale. The thoracic aorta is unremarkable. The origins of the great vessels of the aortic arch are patent. Evaluation of the pulmonary arteries is limited due to suboptimal enhancement of the peripheral branches. No large or central pulmonary artery embolus identified. Mediastinum/Nodes: There is no adenopathy in the region of the left hilum. Evaluation of the right hilum is limited due to consolidative changes of the right lung. There is shift of the mediastinum into the left hemithorax. Mildly enlarged anterior mediastinal lymph nodes measure up to 15 mm in short axis. A Port-A-Cath is noted with tip in the region of the cavoatrial junction. Lungs/Pleura: There is a large somewhat loculated and complex right pleural effusion and pleural based masses. There is compressive atelectasis and collapse  of the right lung. There is only a small aerated portion of the right upper lobe. There are ill-defined hypodense masses in the right chest involving the right lung or pleura which are suboptimally visualized and poorly evaluated. There is mass effect and shift of the mediastinum into the left hemithorax. There is extension of fluid or pleural masses posterior to the mediastinum. A right-sided pleural base tube is seen with tip in the right apical region. There is mass effect and narrowing of the right upper lobe pulmonary artery and high-grade narrowing and complete occlusion of the right mainstem bronchus. Diffuse confluent ground-glass nodular densities in the left lung primarily involving the left lower lobe and lingula most consistent with pneumonia although metastatic disease is not excluded. There is no pneumothorax. Upper Abdomen: No acute abnormality. Musculoskeletal: No chest wall abnormality. No acute or significant osseous findings. Review of  the MIP images confirms the above findings. IMPRESSION: 1. No CT evidence of pulmonary embolism. 2. Large complex pleural effusion and pleural based masses with near complete collapse of the right lung and associated mass effect and shift of the mediastinum into the left hemithorax. There is occlusion of the right mainstem bronchus secondary to mass effect and extrinsic compression. 3. Left lower lobe and lingula airspace densities most consistent with pneumonia. Clinical correlation recommended. Electronically Signed   By: Anner Crete M.D.   On: 03/21/2018 04:46   Dg Chest Port 1 View  Result Date: 03/20/2018 CLINICAL DATA:  Recently diagnosed right upper lobe lung cancer with malignant right pleural effusion status post right VATS with talc pleurodesis. Worsening dyspnea EXAM: PORTABLE CHEST 1 VIEW COMPARISON:  03/12/2018 chest radiograph. FINDINGS: Right internal jugular MediPort terminates at the cavoatrial junction. Right apical chest tube is in place.  Worsening near complete opacification of the right hemithorax with minimal residual aeration in the right upper lung. Worsening marked circumferential right pleural thickening/effusion. Worsened left mediastinal shift. Normal heart size. Stable mediastinal contour. No pneumothorax. No left pleural effusion. Increased patchy left retrocardiac opacity. IMPRESSION: 1. Worsening near complete opacification of the right hemithorax with minimal residual aeration in the right upper lung and worsening diffuse marked right pleural thickening/effusion. 2. Worsening left mediastinal shift with increased patchy left retrocardiac opacity, probably atelectasis. Electronically Signed   By: Ilona Sorrel M.D.   On: 03/20/2018 21:19     STUDIES:  03/21/2018 CT scan chest PE protocol with no PE.  Advanced size of right lung cancer with compression of the right mainstem bronchus and displacement of the trachea to the left.  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  He was the hospital had a CT scan revealed right pleural effusion for which he underwent thoracentesis.  Surgical pathology revealed non-small cell lung cancer.  He underwent 1 round of chemotherapy with (ALOXI)(UDENYCA)(PARAPLATIN) (TAXOL).  Further he had a right Pleurx catheter placed which drained a moderate amount of fluid.  He has proved refractory to chemotherapy and represented to [facility] 03/20/2018 with increasing shortness of breath and upper and lower extremities edema.  Repeat CT scan was negative for pulmonary embolism but revealed progression of tumor with compression of the right mainstem bronchus and shunting of  the trachea to the left.  03/21/2018 pulmonary critical care was called to the bedside for consult.  He was noted to be tripoding in acute respiratory distress.  Due to his increased respiratory distress he was transferred to the intensive care unit.  At the time of this note cardiovascular thoracic surgery was a seen for questionable emergency debulking of the right tumor.  He expressed he would not want long-term intubation but short-term intubation would be acceptable he would not want to have cardiopulmonary resuscitation in case of a code.  Definitely would not want to be trached impact in the future.  His past medical history is significant for a right MCA infarct secondary to patent foramen ovale and embolic event.  He has very little residual from that.  He had a transcatheter closure of his patent foramen ovale.  ASSESSMENT / PLAN:  PULMONARY A: Acute respiratory failure in the setting of progressive  stage IV non-small cell lung cancer with metastasis. Right Pleurx catheter in place History of right talc pleurodesis OSA P:   Transfer to the intensive care unit Consult to cardiovascular thoracic surgery for any interventions Possible short-term intubation No prolonged intubation, tracheostomy, cardiopulmonary resuscitation. Cpap as tolerated  CARDIOVASCULAR A:  History of patent foramen ovale with transcatheter closure 08/2017 Hypertension P:  Monitor blood pressure Treat as needed  RENAL A:   No acute issues P:   Monitor electrolytes  GASTROINTESTINAL A:   GI protection P:   PPI  HEMATOLOGIC A:   Newly diagnosed stage IV non-small cell lung cancer of the right lung with rapid progression despite chemotherapy. P:  Oncology consult for any interventional therapy available CVTS consult  INFECTIOUS A:   Cannot rule out left lower lobe pneumonia P:   Vancomycin and Zosyn day 1 ofx  ENDOCRINE CBG (last 3)  Recent Labs    03/21/18 0553  GLUCAP 105*    A:    No acute issues P:   Monitor glucose  NEUROLOGIC A:   History of cryptogenic stroke with right MCA infarct in the setting of a patent foramen ovale.  Very little neurological residual noted P:   RASS goal: 1 Comfort care as needed   FAMILY  - Updates: 03/21/2018 family and patient updated at length at bedside  - Inter-disciplinary family meet or Palliative Care meeting due by:  day 7    [facility] Minor ACNP Maryanna Shape PCCM Pager [phone] till 1 pm If no answer page [phone] 03/21/2018, 9:58 AM  Attending Note:  47 year old male with recent diagnosis of stage 4 NSCLC who underwent talc pleurodesis and pleurex catheter placement.  PCCM was consulted for evolving respiratory failure.  I reviewed chest CT myself, difficult to ascertain if the lung tissue there is mass vs atelectatic lung, pleural fluid present but appears loculated.  On exam, patient is tripoding but able to speak in semi-full sentences.  I discussed the case with CVTS, they do not believe surgical interventions is appropriate.  I flushed the pleurex catheter, able to push fluid through but unable to aspirate any.  I am concern that there too many loculations and will consider IR to evaluate.  Spoke with patient, LCB with short term intubation only.  Will evaluate with [place] upon arrival to the ICU from SDU.  The patient is critically ill with multiple organ systems failure and requires high complexity decision making for assessment and support, frequent evaluation and titration of therapies, application of advanced monitoring technologies and extensive interpretation of multiple databases.   Critical Care Time devoted to patient care services described in this note is  35  Minutes. This time reflects time of care of this signee Dr Jennet Maduro. This critical care time does not reflect procedure time, or teaching time or supervisory time of PA/NP/Med student/Med Resident etc but could involve care discussion time.  Rush Farmer, M.D. [facility] Pulmonary/Critical Care Medicine. Pager: [phone]. After hours pager: [phone].

## 2018-03-21 NOTE — Progress Notes (Signed)
[facility] Therapy Treatment Record Phone [phone]   Radiation Therapy was administered to Ruben Eaton on: 03/21/2018  7:45 PM and was treatment # 1 out of a planned course of 5 treatments.  Radiation Treatment  1). Beam photons with 6-10 energy  2). Brachytherapy None  3). Stereotactic Radiosurgery None  4). Other Radiation None     Paulla Fore, RT (T)

## 2018-03-21 NOTE — Progress Notes (Signed)
Pt pescribed- Tagrisso-osimertinib (80 mg,) daily. 29 pills were counted/ verified by RN and Pt's wife. Medication was delivered to Pharmacy.  -Pt started medication today, and first dose was taken 03/21/18 @  1330  Pt refused scheduled morphine- (MS Contin) 15 mg scheduled q 12. RN wasted medication in sink with Daleen Squibb., RN.

## 2018-03-21 NOTE — Care Management (Signed)
  This is a no charge note  Transfer from [facility] per [name]  47 year old man with a past medical history of hypertension, hyperlipidemia, stroke, TIA, anxiety, OSA on CPAP, anxiety, recently diagnosed aggressive non-small cell lung cancer (currently on chemotherapy), who presents with shortness of breath.  Per EDP, "pt was unfortunately diagnosed with a very aggressive non-small cell carcinoma of the lung, patient has had 1 dose of chemotherapy a week ago.  He had a CT scan done on the 17th of this month which showed that the tumor has rapidly progressed, with mediastinal shift indicated at that time, patient is on 2 L of home oxygen, on the 31st of last month he had a Pleurx placed by thoracic surgeon, which has been draining small amounts of fluid since it was placed.  Approximately 20 cc every few days". Pt has been having worsening shortness of breath for about 1 week.  With oxygen desaturation to 80s.  Patient does not have chest pain.  No fever or chills.  Patient has anasarca of unclear etiology. Albumin 2.7. BNP 64. CXR showed worsening near complete opacification of the right hemithorax with minimal residual aeration in the right upper lung and worsening diffuse marked right pleural thickening/effusion, and also showed worsening left mediastinal shift with increased patchy left retrocardiac opacity. Concerning for possible obstruction of venous return.   Patient was found to have WBC 7.2, sodium 129, creatinine normal, negative troponin, BNP 64, temperature 97.3, tachycardia, tachypnea, blood pressure 112/62.  caridothoracic surgeon, Dr. Roxy Manns was consulted for consultation. Pt is accepted to SDU as inpt.  Please call manager of [facility] at [phone] when pt arrives to floor   Ivor Costa, MD  [facility] Pager [phone]  If 7PM-7AM, please contact night-coverage [email or web address] Password [facility] 03/21/2018, 12:04 AM

## 2018-03-21 NOTE — Progress Notes (Signed)
Pharmacy Antibiotic Note  Ruben Eaton is a 47 y.o. male admitted on 03/21/2018 with pneumonia.  Pharmacy has been consulted for vancomycin and zosyn dosing.  Plan: Zosyn 3.375g IV q8h (4 hour infusion).  Vancomycin 1500 mg IV x 1 then 750 mg IV q8 hours F/u renal function, cultures and clinical course  Height: 5\' 6"  (167.6 cm) Weight: 170 lb 9.6 oz (77.4 kg) IBW/kg (Calculated) : 63.8  Temp (24hrs), Avg:96.9 F (36.1 C), Min:96.7 F (35.9 C), Max:97 F (36.1 C)  Recent Labs  Lab 03/19/18 1425 03/20/18 2045  WBC 5.3 7.2  CREATININE 0.91 0.82    Estimated Creatinine Clearance: 109 mL/min (by C-G formula based on SCr of 0.82 mg/dL).    No Known Allergies  Thank you for allowing pharmacy to be a part of this patient's care.  Excell Seltzer Poteet 03/21/2018 5:07 AM

## 2018-03-21 NOTE — Progress Notes (Signed)
  Radiation Oncology         [phone] ________________________________  Name: Ruben Eaton MRN: [id number]  Date: 03/21/2018  DOB: Aug 17, 1971  Simulation Verification Note    ICD-10-CM   1. Primary cancer of right lower lobe of lung (HCC) C34.31     Status: inpatient  NARRATIVE: The patient was brought to the treatment unit and placed in the planned treatment position. The clinical setup was verified. Then port films were obtained and uploaded to the radiation oncology medical record software.  The treatment beams were carefully compared against the planned radiation fields. The position location and shape of the radiation fields was reviewed. They targeted volume of tissue appears to be appropriately covered by the radiation beams. Organs at risk appear to be excluded as planned.  Based on my personal review, I approved the simulation verification. The patient's treatment will proceed as planned.  -----------------------------------  Blair Promise, PhD, MD

## 2018-03-21 NOTE — H&P (Signed)
History and Physical    Ruben Eaton UDJ:[id number] DOB: 1971-05-01 DOA: 03/21/2018  PCP: Dion Body, MD  Patient coming from: Patient was transferred from [facility].  Chief Complaint: Shortness of breath.  HPI: HIREN PEPLINSKI is a 47 y.o. male with history of recently diagnosed non-small cell lung cancer with rapid progression, cryptogenic stroke with PFO status post closure, hypertension, PVCs, sleep apnea who was recently diagnosed with pleural effusion and had pleurodesis and Pleurx catheter placement subsequently was found to have non-small cell lung cancer with rapid progression had chemotherapy last week first time and eventually was started Osimsrtinib which patient is here to start started developing worsening shortness of breath with increasing peripheral edema both upper and lower extremities over the last 3 to 4 days after his Decadron was stopped after his chemotherapy.  Patient gets short of breath on lying down.  Denies any chest pain fever chills had some nausea.  Denies any abdominal pain.  ED Course: In the ER chest x-ray was showing near opacification of the right hemithorax and worsening left mediastinal shift.  ER physician at [facility] discussed with Dr. Roxy Manns on-call cardiothoracic surgeon at [facility] and has been accepted for further management.  On exam patient is not in distress but is short of breath and has significant peripheral edema involving both upper and lower extremity.  Review of Systems: As per HPI, rest all negative.   Past Medical History:  Diagnosis Date  . Cryptogenic stroke ([place]) 08/2016   a. 09/15/2016 in setting of PFO.  . CVA (cerebral vascular accident) ([place]) 09/07/2016  . ED (erectile dysfunction)   . Essential (primary) hypertension 02/01/2016  . History of stroke 10/08/2016  . Hx of insomnia   . Hypertension   . OSA on CPAP   . PFO (patent foramen ovale)    a. s/p closure 09/2016 by Dr. [name].  .  Pleural effusion on right 02/21/2018  . Primary cancer of right lower lobe of lung ([place]) 02/25/2018  . PVC's (premature ventricular contractions) 10/08/2016  . 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 TIA (transient ischemic attack) 09/07/2016    Past Surgical History:  Procedure Laterality Date  . APPENDECTOMY  1980  . EYE SURGERY    . PATENT FORAMEN OVALE CLOSURE  10/07/2016  . PATENT FORAMEN OVALE CLOSURE N/A 10/07/2016   Procedure: Patent For[place](PFO) Closure;  Surgeon: [name], MD;  Location: [place] [facility];  Service: Cardiovascular;  Laterality: N/A;  . PORTACATH PLACEMENT N/A 02/23/2018   Procedure: INSERTION PORT-A-CATH;  Surgeon: Nestor Lewandowsky, MD;  Location: [facility] ORS;  Service: Thoracic;  Laterality: N/A;  . STRABISMUS SURGERY Left 1984  . TEE WITHOUT CARDIOVERSION N/A 09/08/2016   Procedure: TRANSESOPHAGEAL ECHOCARDIOGRAM (TEE);  Surgeon: [name], MD;  Location: [facility] ORS;  Service: Cardiovascular;  Laterality: N/A;  . VIDEO ASSISTED THORACOSCOPY (VATS) W/TALC PLEUADESIS Right 02/23/2018   Procedure: VIDEO ASSISTED THORACOSCOPY (VATS) W/TALC PLEUADESIS;  Surgeon: Nestor Lewandowsky, MD;  Location: [facility] ORS;  Service: Thoracic;  Laterality: Right;  . VITRECTOMY Right 2013     reports that he quit smoking about 8 years ago. He has a 5.00 pack-year smoking history. He has quit using smokeless tobacco. His smokeless tobacco use included snuff. He reports that he drinks about 1.8 oz of alcohol per week. He reports that he does not use drugs.  No Known Allergies  Family History  Problem Relation Age of Onset  . Other Paternal  Uncle        Abdominal tumor     Prior to Admission medications   Medication Sig Start Date End Date Taking? Authorizing Provider  aspirin EC 81 MG EC tablet Take 1 tablet (81 mg total) by mouth daily. 09/08/16   Bettey Costa, MD  atorvastatin (LIPITOR) 40 MG tablet Take 1 tablet (40 mg total) by mouth daily at 6 PM. Patient  not taking: Reported on 03/13/2018 09/08/16   Bettey Costa, MD  b complex vitamins capsule Take 1 capsule by mouth daily.     [provider]  B Complex-C (B-COMPLEX WITH VITAMIN C) tablet Take 1 tablet by mouth daily.    [provider]  dexamethasone (DECADRON) 4 MG tablet Take 2 tablets (8 mg total) by mouth daily. Start the day after chemotherapy for 2 days. 03/12/18   Earlie Server, MD  Diclofenac Sodium 2 % SOLN Apply 1 pump twice daily as needed. Patient taking differently: Apply 1 application topically 2 (two) times daily as needed (pain). Apply 1 pump twice daily as needed. 02/01/16   Lyndal Pulley, DO  levofloxacin (LEVAQUIN) 500 MG tablet Take 1 tablet (500 mg total) by mouth daily. Patient not taking: Reported on 03/13/2018 03/07/18   Jacquelin Hawking, NP  loratadine (CLARITIN) 10 MG tablet Take 10 mg by mouth daily.     [provider]  LORazepam (ATIVAN) 0.5 MG tablet Take 1 tablet (0.5 mg total) by mouth every 8 (eight) hours as needed for anxiety. 03/02/18   Earlie Server, MD  metoprolol tartrate (LOPRESSOR) 25 MG tablet Take 1 tablet (25 mg total) by mouth 2 (two) times daily. 02/27/18   Dustin Flock, MD  morphine (MS CONTIN) 15 MG 12 hr tablet Take 1 tablet (15 mg total) by mouth every 12 (twelve) hours. 03/19/18   Cammie Sickle, MD  Morphine Sulfate (MORPHINE CONCENTRATE) 10 mg / 0.5 ml concentrated solution Take 1 mL (20 mg total) by mouth every 4 (four) hours as needed for shortness of breath. 03/12/18   Earlie Server, MD  Multiple Vitamin (MULTIVITAMIN WITH MINERALS) TABS tablet Take 1 tablet by mouth daily.    [provider]  ondansetron (ZOFRAN) 8 MG tablet Take 1 tablet (8 mg total) by mouth 2 (two) times daily as needed for refractory nausea / vomiting. 03/12/18   Earlie Server, MD  osimertinib mesylate (TAGRISSO) 80 MG tablet Take 1 tablet (80 mg total) by mouth daily. Patient not taking: Reported on 03/20/2018 03/19/18   Cammie Sickle, MD    prochlorperazine (COMPAZINE) 10 MG tablet Take 1 tablet (10 mg total) by mouth every 6 (six) hours as needed (Nausea or vomiting). 03/13/18   Earlie Server, MD  senna-docusate (SENOKOT-S) 8.6-50 MG tablet Take 1 tablet by mouth daily. 02/27/18   Dustin Flock, MD  tadalafil (CIALIS) 5 MG tablet Take 5 mg by mouth daily as needed for erectile dysfunction.    [provider]  triamcinolone (NASACORT) 55 MCG/ACT AERO nasal inhaler Place 2 sprays into the nose daily as needed (congestion).     [provider]  Vitamin D, Ergocalciferol, (DRISDOL) 50000 units CAPS capsule Take 1 capsule (50,000 Units total) by mouth every 7 (seven) days. Patient taking differently: Take 50,000 Units by mouth every 7 (seven) days. Mondays 05/05/16   Lyndal Pulley, DO  zolpidem (AMBIEN) 10 MG tablet Take 10 mg by mouth at bedtime as needed for sleep.  12/10/15   [provider]    Physical  Exam: Vitals:   03/21/18 0105 03/21/18 0120  BP: 116/76   Pulse: (!) 103   Resp: 16   Temp: (!) 96.7 F (35.9 C)   TempSrc: Axillary   SpO2: 94%   Weight:  77.4 kg (170 lb 9.6 oz)  Height:  5\' 6"  (1.676 m)      Constitutional: Moderately built and nourished. Vitals:   03/21/18 0105 03/21/18 0120  BP: 116/76   Pulse: (!) 103   Resp: 16   Temp: (!) 96.7 F (35.9 C)   TempSrc: Axillary   SpO2: 94%   Weight:  77.4 kg (170 lb 9.6 oz)  Height:  5\' 6"  (1.676 m)   Eyes: Anicteric no pallor. ENMT: No discharge from the ears eyes nose or mouth. Neck: No mass felt.  No neck rigidity.  JVD not appreciated. Respiratory: Poor air entry on the right side.  No wheezing or crepitations on the left side. Cardiovascular: S1-S2 heard. Abdomen: Soft nontender bowel sounds present. Musculoskeletal: Significant edema with all extremities. Skin: No rash. Neurologic: Alert awake oriented to time place and person.  Moves all extremities. Psychiatric: Appears normal per normal affect.   Labs on Admission: I  have personally reviewed following labs and imaging studies  CBC: Recent Labs  Lab 03/19/18 1425 03/20/18 2045  WBC 5.3 7.2  NEUTROABS 3.5  --   HGB 11.5* 12.0*  HCT 33.2* 34.2*  MCV 90.4 89.6  PLT 182 094   Basic Metabolic Panel: Recent Labs  Lab 03/19/18 1425 03/20/18 2045  NA 131* 129*  K 4.0 3.7  CL 99* 95*  CO2 23 24  GLUCOSE 119* 127*  BUN 21* 23*  CREATININE 0.91 0.82  CALCIUM 9.3 9.3   GFR: Estimated Creatinine Clearance: 109 mL/min (by C-G formula based on SCr of 0.82 mg/dL). Liver Function Tests: Recent Labs  Lab 03/19/18 1425 03/20/18 2100  AST 35 35  ALT 41 40  ALKPHOS 45 44  BILITOT 0.7 0.5  PROT 5.3* 5.3*  ALBUMIN 2.7* 2.7*   No results for input(s): LIPASE, AMYLASE in the last 168 hours. No results for input(s): AMMONIA in the last 168 hours. Coagulation Profile: No results for input(s): INR, PROTIME in the last 168 hours. Cardiac Enzymes: Recent Labs  Lab 03/20/18 2045  TROPONINI <0.03   BNP (last 3 results) No results for input(s): PROBNP in the last 8760 hours. HbA1C: No results for input(s): HGBA1C in the last 72 hours. CBG: No results for input(s): GLUCAP in the last 168 hours. Lipid Profile: No results for input(s): CHOL, HDL, LDLCALC, TRIG, CHOLHDL, LDLDIRECT in the last 72 hours. Thyroid Function Tests: No results for input(s): TSH, T4TOTAL, FREET4, T3FREE, THYROIDAB in the last 72 hours. Anemia Panel: No results for input(s): VITAMINB12, FOLATE, FERRITIN, TIBC, IRON, RETICCTPCT in the last 72 hours. Urine analysis:    Component Value Date/Time   COLORURINE YELLOW (A) 03/20/2018 2256   APPEARANCEUR CLEAR (A) 03/20/2018 2256   LABSPEC 1.021 03/20/2018 2256   PHURINE 6.0 03/20/2018 2256   GLUCOSEU NEGATIVE 03/20/2018 2256   HGBUR NEGATIVE 03/20/2018 2256   BILIRUBINUR NEGATIVE 03/20/2018 2256   KETONESUR 5 (A) 03/20/2018 2256   PROTEINUR NEGATIVE 03/20/2018 2256   NITRITE NEGATIVE 03/20/2018 2256   LEUKOCYTESUR NEGATIVE  03/20/2018 2256   Sepsis Labs: @LABRCNTIP (procalcitonin:4,lacticidven:4) )No results found for this or any previous visit (from the past 240 hour(s)).   Radiological Exams on Admission: Dg Chest Port 1 View  Result Date: 03/20/2018 CLINICAL DATA:  Recently diagnosed right upper  lobe lung cancer with malignant right pleural effusion status post right VATS with talc pleurodesis. Worsening dyspnea EXAM: PORTABLE CHEST 1 VIEW COMPARISON:  03/12/2018 chest radiograph. FINDINGS: Right internal jugular MediPort terminates at the cavoatrial junction. Right apical chest tube is in place. Worsening near complete opacification of the right hemithorax with minimal residual aeration in the right upper lung. Worsening marked circumferential right pleural thickening/effusion. Worsened left mediastinal shift. Normal heart size. Stable mediastinal contour. No pneumothorax. No left pleural effusion. Increased patchy left retrocardiac opacity. IMPRESSION: 1. Worsening near complete opacification of the right hemithorax with minimal residual aeration in the right upper lung and worsening diffuse marked right pleural thickening/effusion. 2. Worsening left mediastinal shift with increased patchy left retrocardiac opacity, probably atelectasis. Electronically Signed   By: Ilona Sorrel M.D.   On: 03/20/2018 21:19    EKG: Independently reviewed.  Sinus tachycardia.  Assessment/Plan Principal Problem:   Acute respiratory failure with hypoxia (HCC) Active Problems:   History of stroke   PVC's (premature ventricular contractions)   Primary cancer of right lower lobe of lung (HCC)   OSA on CPAP   Anasarca    1. Acute respiratory failure with hypoxia with x-ray showing worsening opacification of the right hemithorax with worsening mediastinal shift on the left side with known history of rapidly progressing non-small cell lung cancer of the lung and Anasarca -I discussed with on-call pulmonary critical care Dr. Oletta Darter.   At this time plan is to get a CT angiogram of the chest stat.  Pulmonary critical care will be seeing patient in consult.  Will consult cardiothoracic surgeon. 2. Sleep apnea on CPAP. 3. History of hypertension and PVCs on metoprolol. 4. History of cryptogenic stroke with PFO status post closure on aspirin.   DVT prophylaxis: SCDs in anticipation of procedure. Code Status: Full code. Family Communication: Family at the bedside. Disposition Plan: Home. Consults called: Pulmonary critical care. Admission status: Inpatient.   Rise Patience MD [facility] Pager [phone].  If 7PM-7AM, please contact night-coverage [email or web address] Password [email or web address]  03/21/2018, 2:50 AM

## 2018-03-21 NOTE — Progress Notes (Signed)
Pharmacy Antibiotic Note  Ruben Eaton is a 47 y.o. male admitted on 03/21/2018 with pneumonia.  Pharmacy has been consulted for vancomycin + piperacillin/tazobactam dosing.  Patient transferred from [facility] for urgent radiation. Pt has recently diagnosed NSCLC that is rapidly progressing. Pt s/p cycle 1 of Carboplatin/Paclitaxel on 03/13/18. Patient recently prescribed Tagrisso which was scheduled to begin on 03/21/18.  Broad presents with respiratory distress, broad spectrum antibiotics ordered for pneumonia.   Patient is currently on day #1 of antibiotics and received vancomycin 1500 mg loading dose followed by 750 mg IV q8h (last dose @ 1507 today).  Today, 03/21/18  WBC 6.4  Afebrile  Plan:  Continue piperacillin/tazobactam 3.375 g IV q8h EI  Will adjust vancomycin dose to 1000 mg IV q12h to target goal AUC 400-500  Continue to monitor renal function, and vancomycin levels once at steady state  Height: 5\' 6"  (167.6 cm) Weight: 170 lb 9.6 oz (77.4 kg) IBW/kg (Calculated) : 63.8  Temp (24hrs), Avg:97.7 F (36.5 C), Min:96.7 F (35.9 C), Max:98.4 F (36.9 C)  Recent Labs  Lab 03/19/18 1425 03/20/18 2045 03/21/18 0500  WBC 5.3 7.2 6.4  CREATININE 0.91 0.82 0.88    Estimated Creatinine Clearance: 101.6 mL/min (by C-G formula based on SCr of 0.88 mg/dL).    No Known Allergies  Antimicrobials this admission: vancomycin 6/26 >>  Piperacillin/tazobactam 6/26 >>   Dose adjustments this admission:  Microbiology results: 6/26 Surgical MRSA PCR: Negative  Thank you for allowing pharmacy to be a part of this patient's care.  Lenis Noon, PharmD, BCPS Clinical Pharmacist 03/21/2018 7:43 PM

## 2018-03-21 NOTE — Progress Notes (Signed)
Report called to RN [facility]

## 2018-03-21 NOTE — Progress Notes (Signed)
Radiation Oncology         [phone] ________________________________  Initial Inpatient Consultation  Name: Ruben Eaton MRN: [id number]  Date: 03/21/2018  DOB: 1971/04/30  IO:[id number], Ruben Muss, MD  Ruben Pier, MD   REFERRING PHYSICIAN: Ladell Pier, MD  DIAGNOSIS: 47 year-old gentleman with non-small cell lung cancer, status-post pleural biopsy 02/23/2018 confirming a high-grade carcinoma of lung origin   HISTORY OF PRESENT ILLNESS::Ruben Eaton is a 47 y.o. male who is currently admitted to [facility]. The patient is critically ill with respiratory failure and SVC syndrome.  The patient was recently diagnosed with advanced stage non-small carcinoma lung cancer. He originally presented with shortness of breath and history of bronchitis in May 2019. CT Chest on 02/21/2018 showed multiple enhancing pleural-based nodules throughout the right hemithorax with large right pleural effusion most concerning for pleural-based malignancy/metastatic disease. There is a more dominant pleural-based mass within the anterior right hemithorax. There are multiple enlarged mediastinal lymph nodes concerning for nodal metastatic disease and multiple nodules within the left lung concerning for the possibility of pulmonary metastatic disease.  The patient underwent US right-sided thoracentesis on 02/22/2018, removing 2 liters of pleural fluid. Pathology revealed a high-grade carcinoma with sarcomatoid features. The immunohistochemical stains supported the diagnosis of a lung primary. 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  Brain MRI on 02/24/2018 showed no evidence of metastatic disease. PET scan on 03/01/2018 showed large hypermetabolic masslike opacity in the anterior right upper lobe, suspicious for primary bronchogenic carcinoma. Also seen is bulky hypermetabolic soft tissue density throughout the right pleural space, consistent with metastatic disease. No evidence of metastatic disease  within the neck, abdomen, or pelvis.  Most recent CT Angio Chest performed on 03/12/2018 shows very rapid increase in extensive tumor in the right hemithorax since the PET-CT done 11 days prior. Also seen was a shift of the heart and mediastinal structures to the left with depression of the right hemidiaphragm due to the extensive tumor in the right hemithorax and in the mediastinum. No pulmonary emboli. Further compression of the right lung by tumor. Expansion of loculated fluid in the right midzone. Resolution of left pleural effusion.  The patient received Cycle 1 Taxol/carboplatin on 03/13/2018.  The patient presented to [facility] ED yesterday complaining of increased shortness of breath and leg swelling. He reports dyspnea over 3-4 days. He has also developed neck/arm swelling. He was admitted to [facility]. CT Chest today revealed no pericardial effusion. There is shift of the mediastinum into the left hemithorax. There is a large loculated and complex right pleural effusion and pleural-based masses. There is compressive atelectasis of the right lung. There is a right pleural catheter. There is mass-effect and narrowing of the right upper lobe pulmonary artery and high-grade narrowing of the right mainstem bronchus. Diffuse groundglass nodular densities in the left lung.   The patient is seen today for further evaluation and discussion of radiation treatment options.  PREVIOUS RADIATION THERAPY: No  PAST MEDICAL HISTORY:  has a past medical history of Cryptogenic stroke ([place]) (08/2016), CVA (cerebral vascular accident) ([place]) (09/07/2016), ED (erectile dysfunction), Essential (primary) hypertension (02/01/2016), History of stroke (10/08/2016), insomnia, Hypertension, OSA on CPAP, PFO (patent foramen ovale), Pleural effusion on right (02/21/2018), Primary cancer of right lower lobe of lung ([place]) (02/25/2018), PVC's (premature ventricular contractions) (10/08/2016), Quadriceps tendinitis (02/01/2016), and TIA  (transient ischemic attack) (09/07/2016).    PAST SURGICAL HISTORY: Past Surgical History:  Procedure Laterality Date  . APPENDECTOMY  1980  .  EYE SURGERY    . PATENT FORAMEN OVALE CLOSURE  10/07/2016  . PATENT FORAMEN OVALE CLOSURE N/A 10/07/2016   Procedure: Patent [place](PFO) Closure;  Surgeon: Ruben Mocha, MD;  Location: [place] [facility];  Service: Cardiovascular;  Laterality: N/A;  . PORTACATH PLACEMENT N/A 02/23/2018   Procedure: INSERTION PORT-A-CATH;  Surgeon: Ruben Lewandowsky, MD;  Location: [facility] ORS;  Service: Thoracic;  Laterality: N/A;  . STRABISMUS SURGERY Left 1984  . TEE WITHOUT CARDIOVERSION N/A 09/08/2016   Procedure: TRANSESOPHAGEAL ECHOCARDIOGRAM (TEE);  Surgeon: [name], MD;  Location: [facility] ORS;  Service: Cardiovascular;  Laterality: N/A;  . VIDEO ASSISTED THORACOSCOPY (VATS) W/TALC PLEUADESIS Right 02/23/2018   Procedure: VIDEO ASSISTED THORACOSCOPY (VATS) W/TALC PLEUADESIS;  Surgeon: Ruben Lewandowsky, MD;  Location: [facility] ORS;  Service: Thoracic;  Laterality: Right;  . VITRECTOMY Right 2013    FAMILY HISTORY: family history includes Other in his paternal uncle.  SOCIAL HISTORY:  reports that he quit smoking about 8 years ago. He has a 5.00 pack-year smoking history. He has quit using smokeless tobacco. His smokeless tobacco use included snuff. He reports that he drinks about 1.8 oz of alcohol per week. He reports that he does not use drugs.  ALLERGIES: Patient has no known allergies.  MEDICATIONS:  No current facility-administered medications for this encounter.    No current outpatient medications on file.   Facility-Administered Medications Ordered in Other Encounters  Medication Dose Route Frequency Provider Last Rate Last Dose  . 0.9 %  sodium chloride infusion   Intravenous PRN Patrecia Pour, MD 10 mL/hr at 03/21/18 1800    . acetaminophen (TYLENOL) tablet 650 mg  650 mg Oral Q6H PRN Patrecia Pour, MD       Or  . acetaminophen (TYLENOL)  suppository 650 mg  650 mg Rectal Q6H PRN Patrecia Pour, MD      . aspirin EC tablet 81 mg  81 mg Oral Daily Patrecia Pour, MD      . MEDLINE mouth rinse  15 mL Mouth Rinse BID Vance Gather B, MD      . metoprolol tartrate (LOPRESSOR) tablet 25 mg  25 mg Oral BID Patrecia Pour, MD   25 mg at 03/21/18 2104  . morphine (MS CONTIN) 12 hr tablet 15 mg  15 mg Oral Q12H Vance Gather B, MD      . morphine CONCENTRATE 10 MG/0.5ML oral solution 20 mg  20 mg Oral Q4H PRN Patrecia Pour, MD   20 mg at 03/21/18 0345  . multivitamin with minerals tablet 1 tablet  1 tablet Oral Daily Patrecia Pour, MD   1 tablet at 03/21/18 [phone]  . ondansetron (ZOFRAN) tablet 4 mg  4 mg Oral Q6H PRN Patrecia Pour, MD       Or  . ondansetron [facility]) injection 4 mg  4 mg Intravenous Q6H PRN Patrecia Pour, MD      . osimertinib mesylate (TAGRISSO) tablet 80 mg  80 mg Oral Daily Vance Gather B, MD      . piperacillin-tazobactam (ZOSYN) IVPB 3.375 g  3.375 g Intravenous Q8H Vance Gather B, MD 12.5 mL/hr at 03/21/18 2104 3.375 g at 03/21/18 2104  . prochlorperazine (COMPAZINE) tablet 10 mg  10 mg Oral Q6H PRN Patrecia Pour, MD      . senna-docusate (Senokot-S) tablet 1 tablet  1 tablet Oral Daily Patrecia Pour, MD   1 tablet at 03/21/18 [phone]  . [START ON 03/22/2018]  vancomycin (VANCOCIN) IVPB 1000 mg/200 mL premix  1,000 mg Intravenous Q12H Lenis Noon, RPH        REVIEW OF SYSTEMS:  REVIEW OF SYSTEMS: A 10+ POINT REVIEW OF SYSTEMS WAS OBTAINED including neurology, dermatology, psychiatry, cardiac, respiratory, lymph, extremities, GI, GU, musculoskeletal, constitutional, reproductive, HEENT. All pertinent positives are noted in the HPI. All others are negative.   PHYSICAL EXAM: this is a very pleasant gentleman sitting in hospital wheelchair with supplemental oxygen in place at 5 L. He has significant swelling of his neck and face upper chest and lower extremities. On lung examination there is very little air movement in the right lung  field. Left lung is clear. The heart has a regular rhythm with increased rate area   ECOG = 4  LABORATORY DATA:  Lab Results  Component Value Date   WBC 6.4 03/21/2018   HGB 11.2 (L) 03/21/2018   HCT 33.1 (L) 03/21/2018   MCV 88.7 03/21/2018   PLT 198 03/21/2018   NEUTROABS 3.5 03/19/2018   Lab Results  Component Value Date   NA 131 (L) 03/21/2018   K 3.7 03/21/2018   CL 96 (L) 03/21/2018   CO2 26 03/21/2018   GLUCOSE 111 (H) 03/21/2018   CREATININE 0.88 03/21/2018   CALCIUM 9.6 03/21/2018      RADIOGRAPHY: Dg Chest 2 View  Result Date: 03/12/2018 CLINICAL DATA:  RIGHT-side pleural effusion, shortness of breath RIGHT lower lobe lung cancer, former smoker EXAM: CHEST - 2 VIEW COMPARISON:  03/07/2018 FINDINGS: RIGHT jugular Port-A-Cath with tip projecting over SVC. RIGHT thoracostomy tube. Stable heart size. Loculated RIGHT pleural effusion and nodularity throughout the RIGHT hemithorax from known malignant effusion. Mediastinal shift RIGHT to LEFT increased. LEFT lung clear. No LEFT pneumothorax identified. Osseous structures unremarkable. IMPRESSION: Known RIGHT pleural carcinomatosis and malignant effusion with increased RIGHT pleural opacity and RIGHT lung atelectasis since 03/07/2018. Increased RIGHT to LEFT midline shift. Electronically Signed   By: Lavonia Dana M.D.   On: 03/12/2018 11:35   Dg Chest 2 View  Result Date: 03/07/2018 CLINICAL DATA:  Lung carcinoma.  Cough. EXAM: CHEST - 2 VIEW COMPARISON:  March 05, 2018 FINDINGS: Right pleural drain is again noted with the tip at the right apex level. Port-A-Cath tip is in the superior vena cava. No pneumothorax. There are areas of apparent loculated effusion as well as consolidation throughout the right lung. The left lung is clear. Heart size and pulmonary vascularity are normal. IMPRESSION: No appreciable change compared to most recent chest radiograph. Areas of loculated effusion as well as consolidation and probable underlying  mases on the right with opacification of much of the right lung, stable. Left lung clear. Stable cardiac silhouette. Electronically Signed   By: Lowella Grip III M.D.   On: 03/07/2018 10:10   Dg Chest 2 View  Result Date: 03/05/2018 CLINICAL DATA:  47 year old male with shortness of breath for 2 weeks. Right lung cancer diagnosed in the past several weeks. EXAM: CHEST - 2 VIEW COMPARISON:  PET-CT 03/01/2018 and earlier. FINDINGS: Right chest IJ approach power port appear stable, not currently accessed. Superimposed right pleural drain. Regressed large right pleural effusion and patchy improved right lung ventilation since the radiographs on 02/21/2018. Residual loculated pleural fluid and/or pleural thickening. Associated thickening of the right paratracheal stripe and hilum. Left mediastinal contours remain normal. A cardiac interatrial occluded device is re-demonstrated. Small left pleural effusion is stable since 03/01/2018, otherwise the left lung is clear. Stable visualized osseous structures. Negative  visible bowel gas pattern. IMPRESSION: 1. Right pleural drain in place with regressed right pleural effusion and patchy improved right lung ventilation since 02/21/2018. Residual loculated pleural fluid and/or bulky pleural thickening. 2. Stable small layering left pleural effusion. 3. No new cardiopulmonary abnormality identified. Electronically Signed   By: Genevie Ann M.D.   On: 03/05/2018 15:27   Dg Chest 2 View  Result Date: 02/21/2018 CLINICAL DATA:  Shortness of breath and chest pain beginning 3 days ago. EXAM: CHEST - 2 VIEW COMPARISON:  07/21/2006 FINDINGS: Left hemithorax is clear. ASD closure device overlies the heart. There is a large right effusion with near complete collapse of the right lung. Partial aeration of the right upper lobe. Mediastinum bows towards the left. No significant bone finding. IMPRESSION: Large right effusion with near complete collapse of the right lung. Partial  aeration of the right upper lobe. Mediastinal shift towards the left. Left chest otherwise clear. ASD closure device. Electronically Signed   By: Nelson Chimes M.D.   On: 02/21/2018 16:20   Ct Chest W Contrast  Result Date: 02/21/2018 CLINICAL DATA:  Patient with shortness of breath. History of bronchitis. Large right pleural effusion. EXAM: CT CHEST WITH CONTRAST TECHNIQUE: Multidetector CT imaging of the chest was performed during intravenous contrast administration. CONTRAST:  41m ISOVUE-370 IOPAMIDOL (ISOVUE-370) INJECTION 76% COMPARISON:  Chest radiograph 02/21/2018 FINDINGS: Cardiovascular: Normal heart size. Trace fluid superior pericardial recess. Normal caliber aorta and main pulmonary artery. Mediastinum/Nodes: Leftward mediastinal shift secondary to large right pleural effusion. Enlarged mediastinal lymph nodes are demonstrated including a 1.3 cm prevascular lymph node (image 55; series 2). There is a 0.8 cm right paratracheal lymph node (image 52; series 2). Esophagus is poorly visualized. Lungs/Pleura: Mild leftward shift of the tracheal air column. There is a 6 mm nodule within the left lower lobe (image 121; series 3). There is a 6 mm left lower lobe nodule (image 79; series 3). There is a 5 mm nodule in the lingula (image 55; series 3). There is a 3 mm nodule within the left upper lobe (image 41; series 3). There is a large right pleural effusion with near total collapse of the right lung. There is minimal aeration of the right upper lobe. Multiple enhancing predominantly pleural-based nodules are demonstrated throughout the right hemithorax. There is a more dominant pleural-based nodule demonstrated within the anterior right upper hemithorax which measures up to 7 cm. Upper Abdomen: Unremarkable. Musculoskeletal: Thoracic spine degenerative changes. No aggressive or acute appearing osseous lesions. IMPRESSION: 1. Multiple enhancing pleural-based nodules are demonstrated throughout the right  hemithorax with large right pleural effusion most concerning for pleural-based malignancy/metastatic disease. There is a more dominant pleural-based mass within the anterior right hemithorax. 2. Multiple enlarged mediastinal lymph nodes concerning for nodal metastatic disease. 3. Multiple nodules within the left lung concerning for the possibility of pulmonary metastatic disease. 4. These results were called by telephone at the time of interpretation on 02/21/2018 at 5:58 pm. Electronically Signed   By: DLovey NewcomerM.D.   On: 02/21/2018 18:00   Ct Angio Chest Pe W Or Wo Contrast  Result Date: 03/21/2018 CLINICAL DATA:  47year old male with shortness of breath. Patient receiving chemotherapy for right lower lobe lung cancer. EXAM: CT ANGIOGRAPHY CHEST WITH CONTRAST TECHNIQUE: Multidetector CT imaging of the chest was performed using the standard protocol during bolus administration of intravenous contrast. Multiplanar CT image reconstructions and MIPs were obtained to evaluate the vascular anatomy. CONTRAST:  729mISOVUE-370 IOPAMIDOL (ISOVUE-370) INJECTION 76%  COMPARISON:  None. FINDINGS: Cardiovascular: There is no cardiomegaly or pericardial effusion. An Amplatzer occlusive device of patent foramen ovale. The thoracic aorta is unremarkable. The origins of the great vessels of the aortic arch are patent. Evaluation of the pulmonary arteries is limited due to suboptimal enhancement of the peripheral branches. No large or central pulmonary artery embolus identified. Mediastinum/Nodes: There is no adenopathy in the region of the left hilum. Evaluation of the right hilum is limited due to consolidative changes of the right lung. There is shift of the mediastinum into the left hemithorax. Mildly enlarged anterior mediastinal lymph nodes measure up to 15 mm in short axis. A Port-A-Cath is noted with tip in the region of the cavoatrial junction. Lungs/Pleura: There is a large somewhat loculated and complex right  pleural effusion and pleural based masses. There is compressive atelectasis and collapse of the right lung. There is only a small aerated portion of the right upper lobe. There are ill-defined hypodense masses in the right chest involving the right lung or pleura which are suboptimally visualized and poorly evaluated. There is mass effect and shift of the mediastinum into the left hemithorax. There is extension of fluid or pleural masses posterior to the mediastinum. A right-sided pleural base tube is seen with tip in the right apical region. There is mass effect and narrowing of the right upper lobe pulmonary artery and high-grade narrowing and complete occlusion of the right mainstem bronchus. Diffuse confluent ground-glass nodular densities in the left lung primarily involving the left lower lobe and lingula most consistent with pneumonia although metastatic disease is not excluded. There is no pneumothorax. Upper Abdomen: No acute abnormality. Musculoskeletal: No chest wall abnormality. No acute or significant osseous findings. Review of the MIP images confirms the above findings. IMPRESSION: 1. No CT evidence of pulmonary embolism. 2. Large complex pleural effusion and pleural based masses with near complete collapse of the right lung and associated mass effect and shift of the mediastinum into the left hemithorax. There is occlusion of the right mainstem bronchus secondary to mass effect and extrinsic compression. 3. Left lower lobe and lingula airspace densities most consistent with pneumonia. Clinical correlation recommended. Electronically Signed   By: Anner Crete M.D.   On: 03/21/2018 04:46   Ct Angio Chest Pe W Or Wo Contrast  Result Date: 03/12/2018 CLINICAL DATA:  Increasing shortness of breath. EXAM: CT ANGIOGRAPHY CHEST WITH CONTRAST TECHNIQUE: Multidetector CT imaging of the chest was performed using the standard protocol during bolus administration of intravenous contrast. Multiplanar CT  image reconstructions and MIPs were obtained to evaluate the vascular anatomy. CONTRAST:  20m ISOVUE-370 IOPAMIDOL (ISOVUE-370) INJECTION 76% COMPARISON:  PET-CT dated 03/01/2018 and chest CT dated 02/21/2018 FINDINGS: Cardiovascular: There is due shift of the heart and other mediastinal structures to the left by the rapidly expanding extensive tumor in the right hemithorax. There are no pulmonary emboli. There is slight compression of the right main pulmonary artery back extensive mediastinal and hilar adenopathy. Port-A-Cath tip at the cavoatrial junction. Mediastinum/Nodes: Rapid increased extensive right hilar and mediastinal adenopathy. Rapid expansion of subcarinal adenopathy and adenopathy adjacent to the distal esophagus. Lungs/Pleura: There has been quite rapid growth of the extensive tumor in the right hemithorax as well as expansion of a cystic area in the right midzone with further compressive atelectasis of the remaining portion of the right lung. There is a small right chest tube in place. No significant residual right effusion. Left effusion has resolved since the prior study. Small  new focal areas of patchy infiltrate in the anterior aspect of the left upper lobe. Upper Abdomen: There is extensive tumor along the superior surface of the right hemidiaphragm depressing the right hemidiaphragm and the right lobe of the liver. Musculoskeletal: No chest wall abnormality. No acute or significant osseous findings. Review of the MIP images confirms the above findings. IMPRESSION: 1. Very rapid increase in extensive tumor in the right hemithorax since the PET-CT scan done 11 days ago. 2. Shift of the heart and mediastinal structures to the left with depression of the right hemidiaphragm due to the extensive tumor in the right hemithorax and in the mediastinum. 3. No pulmonary emboli. 4. Further compression of the right lung by tumor. 5. Expansion of loculated fluid in the right midzone. 6. Resolution of left  pleural effusion. Critical Value/emergent results were called by telephone at the time of interpretation on 03/12/2018 at 12:11 pm to [place], NP, who verbally acknowledged these results. Electronically Signed   By: Lorriane Shire M.D.   On: 03/12/2018 12:19   Mr Jeri Cos ZW Contrast  Result Date: 02/24/2018 CLINICAL DATA:  New diagnosis lung carcinoma. EXAM: MRI HEAD WITHOUT AND WITH CONTRAST TECHNIQUE: Multiplanar, multiecho pulse sequences of the brain and surrounding structures were obtained without and with intravenous contrast. CONTRAST:  59m MULTIHANCE GADOBENATE DIMEGLUMINE 529 MG/ML IV SOLN COMPARISON:  None. FINDINGS: Brain: Diffusion imaging does not show any acute or subacute infarction. Brainstem is normal. 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 There is old infarction in the deep insula and right posterior frontal brain. Mild chronic small-vessel changes seen elsewhere within the hemispheric white matter. There is no evidence of metastatic disease. No hemorrhage, hydrocephalus or extra-axial collection. Vascular: Major vessels at the base of the brain show flow. Skull and upper cervical spine: Negative Sinuses/Orbits: Clear/normal Other: None IMPRESSION: No evidence of metastatic disease.  Old ischemic changes as above. Electronically Signed   By: MNelson ChimesM.D.   On: 02/24/2018 11:58   Nm Pet Image Initial (pi) Skull Base To Thigh  Result Date: 03/01/2018 CLINICAL DATA:  Initial treatment strategy for right lung carcinoma. EXAM: NUCLEAR MEDICINE PET SKULL BASE TO THIGH TECHNIQUE: 9.6 mCi F-18 FDG was injected intravenously. Full-ring PET imaging was performed from the skull base to thigh after the radiotracer. CT data was obtained and used for attenuation correction and anatomic localization. Fasting blood glucose: 86 mg/dl COMPARISON:  Chest CT on 02/21/2018 FINDINGS: (Mediastinal blood pool activity: SUV max = 1.7) NECK:  No hypermetabolic lymph nodes or masses. Incidental  CT findings:  None. CHEST: No hypermetabolic lymphadenopathy. Right pleural effusion is decreased in size since previous study and right pleural drain is seen in place. Large masslike opacity in the anterior inferior right upper lobe is seen which abuts the anterior chest wall and measures approximately 12.0 x 6.5 cm on image 87/3. This has SUV max of 12.0, and is suspicious for primary bronchogenic carcinoma. Bulky hypermetabolic soft tissue density is seen throughout the right pleural space, consistent with pleural metastatic disease. Small left pleural effusion is seen, without FDG uptake. No suspicious left lung nodules or masses identified. Incidental CT findings:  None. ABDOMEN/PELVIS: No abnormal hypermetabolic activity within the liver, pancreas, adrenal glands, or spleen. No hypermetabolic lymph nodes in the abdomen or pelvis. Incidental CT findings:  Mild ascites seen, without FDG uptake. SKELETON: No focal hypermetabolic bone lesions to suggest skeletal metastasis. Incidental CT findings:  None. IMPRESSION: Large hypermetabolic masslike opacity in the anterior right  upper lobe, suspicious for primary bronchogenic carcinoma. Bulky hypermetabolic soft tissue density throughout the right pleural space, consistent with metastatic disease. No evidence of metastatic disease within the neck, abdomen, or pelvis. Electronically Signed   By: Earle Gell M.D.   On: 03/01/2018 11:28   Dg Chest Port 1 View  Result Date: 03/20/2018 CLINICAL DATA:  Recently diagnosed right upper lobe lung cancer with malignant right pleural effusion status post right VATS with talc pleurodesis. Worsening dyspnea EXAM: PORTABLE CHEST 1 VIEW COMPARISON:  03/12/2018 chest radiograph. FINDINGS: Right internal jugular MediPort terminates at the cavoatrial junction. Right apical chest tube is in place. Worsening near complete opacification of the right hemithorax with minimal residual aeration in the right upper lung. Worsening marked  circumferential right pleural thickening/effusion. Worsened left mediastinal shift. Normal heart size. Stable mediastinal contour. No pneumothorax. No left pleural effusion. Increased patchy left retrocardiac opacity. IMPRESSION: 1. Worsening near complete opacification of the right hemithorax with minimal residual aeration in the right upper lung and worsening diffuse marked right pleural thickening/effusion. 2. Worsening left mediastinal shift with increased patchy left retrocardiac opacity, probably atelectasis. Electronically Signed   By: Ilona Sorrel M.D.   On: 03/20/2018 21:19   Dg Chest Port 1 View  Result Date: 02/26/2018 CLINICAL DATA:  Chest tube in place.  Lung cancer. EXAM: PORTABLE CHEST 1 VIEW COMPARISON:  One-view chest x-ray 02/23/2018 FINDINGS: Heart size is normal. A right IJ Port-A-Cath is stable. Two right-sided chest tubes are stable in position. Interstitial and airspace disease is increasing on the right. A right pleural effusion is suspected. The left lung is clear. The visualized soft tissues and bony thorax are unremarkable. No residual pneumothorax is present. IMPRESSION: 1. Increasing right pleural effusion and airspace disease. 2. PleurX catheter and right-sided chest tube stable in position. 3. Right IJ catheter stable. Electronically Signed   By: San Morelle M.D.   On: 02/26/2018 07:17   Dg Chest Port 1 View  Result Date: 02/23/2018 CLINICAL DATA:  Postoperative chest x-ray.  Tube placements. EXAM: PORTABLE CHEST 1 VIEW COMPARISON:  02/22/2018. FINDINGS: PowerPort catheter with tip over cavoatrial junction. Two right chest tubes noted with tip over the right upper chest. Significant reduction in right-sided pleural effusion with mild residual. No pneumothorax. Atelectatic changes are noted in the right upper and lower lungs. Follow-up exams to demonstrate resolution suggested. Heart size normal. No acute bony abnormality. IMPRESSION: 1.  PowerPort catheter noted with  tip at the cavoatrial junction. 2. Two right chest tubes noted. Significant reduction in right-sided pleural effusion with mild residual. Atelectatic changes noted the right upper and lower lungs. Follow-up exams demonstrate resolution suggested. Electronically Signed   By: Marcello Moores  Register   On: 02/23/2018 16:10   Dg Chest Port 1 View  Result Date: 02/22/2018 CLINICAL DATA:  Post right-sided thoracentesis EXAM: PORTABLE CHEST 1 VIEW COMPARISON:  02/21/2018; chest CT-02/21/2018 FINDINGS: Interval reduction in persistent moderate-sized right-sided effusion post thoracentesis. No pneumothorax. Slight reduction in previously noted right to left mediastinal shift. Otherwise, unchanged cardiac silhouette and mediastinal contours with persistent obscuration of the right heart border secondary to right basilar consolidative opacities. Post PFO ligation. No new focal airspace opacities. The left hemithorax remains well aerated. No acute osseus abnormalities. IMPRESSION: Interval reduction in persistent moderate-sized right-sided effusion post thoracentesis. No pneumothorax. Electronically Signed   By: Sandi Mariscal M.D.   On: 02/22/2018 10:41   Dg C-arm 1-60 Min-no Report  Result Date: 02/23/2018 Fluoroscopy was utilized by the requesting physician.  No radiographic interpretation.   Ir US Chest  Result Date: 03/21/2018 CLINICAL DATA:  47 year old male with mediastinal shift and SVC syndrome EXAM: CHEST ULTRASOUND COMPARISON:  CT 03/21/2018, 03/01/2018, 02/21/2018 FINDINGS: Limited ultrasound images were performed in order to locate potential fluid for aspiration/drainage. Ultrasound survey of the anterior and posterior chest demonstrates tumor tissue occupying space around the lungs. Drainage deferred at this time. IMPRESSION: Limited ultrasound images of the chest demonstrate no localized fluid collection for drainage. Drainage deferred at this time. Electronically Signed   By: Corrie Mckusick D.O.   On:  03/21/2018 16:57   US Thoracentesis Asp Pleural Space W/img Guide  Result Date: 02/22/2018 INDICATION: No known primary, now with multiple pleural based masses and symptomatic right-sided pleural effusion. Please perform ultrasound-guided thoracentesis for diagnostic and therapeutic purposes. EXAM: US THORACENTESIS ASP PLEURAL SPACE W/IMG GUIDE COMPARISON:  Chest radiograph - 02/21/2018; chest CT - 02/21/2018 MEDICATIONS: None. COMPLICATIONS: None immediate. TECHNIQUE: Informed written consent was obtained from the patient after a discussion of the risks, benefits and alternatives to treatment. A timeout was performed prior to the initiation of the procedure. Initial ultrasound scanning demonstrates a large anechoic right-sided pleural effusion. The lower chest was prepped and draped in the usual sterile fashion. 1% lidocaine was used for local anesthesia. An ultrasound image was saved for documentation purposes. An 8 Fr Safe-T-Centesis catheter was introduced. The thoracentesis was performed. The catheter was removed and a dressing was applied. The patient tolerated the procedure well without immediate post procedural complication. The patient was escorted to have an upright chest radiograph. FINDINGS: A total of approximately 2 liters of serous, slightly blood tinged fluid was removed. Requested samples were sent to the laboratory. IMPRESSION: Successful ultrasound-guided right sided thoracentesis yielding 2 liters of pleural fluid. Electronically Signed   By: Sandi Mariscal M.D.   On: 02/22/2018 11:06      IMPRESSION: 47 year-old gentleman with non-small cell lung cancer, status-post pleural biopsy 02/23/2018 confirming a high-grade carcinoma of lung origin. Stage IV  The patient has been diagnosed with advanced stage non-small cell lung cancer. He is admitted with respiratory failure in the setting of extensive disease in the chest with right lung collapse. He may have tumor spread versus pneumonia in the  left chest. The patient is critically ill with respiratory failure and SVC syndrome. He may benefit from emergent radiation to the chest.  The patient was previously seen at [facility] and underwent CT simulation and treatment planning in their radiation oncology department last week. We will use this planning for emergent treatment if possible.  If the patient is able to lie flat then we will treat the patient tonight and tomorrow morning. If the patient is unable to lie flat then we we will attempt treatment of a slant board tomorrow morning.  PLAN: We will attempt to treat the patient tonight if possible but with his severe superior vena cava syndrome and difficulty with breathing this may not be possible.       ------------------------------------------------  Blair Promise, PhD, MD  This document serves as a record of services personally performed by Gery Pray, MD. It was created on his behalf by Arlyce Harman, a trained medical scribe. The creation of this record is based on the scribe's personal observations and the provider's statements to them. This document has been checked and approved by the attending provider.

## 2018-03-21 NOTE — Progress Notes (Signed)
PROGRESS NOTE  Ruben Eaton  QJJ:[id number] DOB: [birth year] DOA: 03/21/2018 PCP: Dion Body, MD  Outpatient Specialists: Oncology, Dr. Rogue Bussing; RadOnc, Dr. Baruch Gouty Brief Narrative: Ruben Eaton is a previously very active 47yo male with a history of cryptogenic stroke s/p PFO closure, HTN, OSA on CPAP, and recently diagnosed NSCLC who presented to [facility] with rapidly progressive swelling and dyspnea and orthopnea. He has undergone 1 cycle of taxol chemotherapy and recently was found to have EGFR mutation for which targeted immunotherapy, osimertinib, was ordered and shipped to his home, arriving 6/26 (has not started). He also had talc pleurodesis and pleur-x catheter placed though this has drained little fluid. On arrival in the ED he was thought to have worsening fluid vs. tumor in the right hemithorax and transferred to [facility] for cardiothoracic surgery evaluation. Unable to undergo CT due to orthopnea initially, this was performed at [facility] showing collapse at the level of the right mainstem bronchus and progression of tumor enlargement, leftward mediastinal shift, and haziness in the left lung thought to be possible pneumonia. Vancomycin and zosyn were started. PCCM consulted for respiratory distress, transferred to ICU. Pt's oncologist was contacted, recommending starting osimertinib while inpatient and consulting medical oncology. Cardiothoracic surgery has since indicated he is not a surgical candidate. IR was consulted for consideration of drainage. Oncology has recommended transfer to [facility] for urgent radiation.   Assessment & Plan: Principal Problem:   Acute respiratory failure with hypoxia (HCC) Active Problems:   History of stroke   PVC's (premature ventricular contractions)   Pleural effusion   Primary cancer of right lower lobe of lung (HCC)   OSA on CPAP   Anasarca   Lung cancer (HCC)  Acute hypoxic respiratory failure: Due to progressive tumor, occluding right mainstem  bronchus, also possibly due to loculated fluid on right and HCAP on left.  - Continue supplemental oxygen as needed.  - Continue vancomycin, zosyn - Treat as below - PCCM consulted, working on pleurx catheter flush/drain, though the problem is tumor bulk more than fluid.   Rapidly progressive NSCLC with opacification of right hemithorax and mediastinal shift:  - Not candidate for debulking per TCTS - IR performed U/S showing solid tissue with very little fluid, felt risks of attempted drainage outweighed benefits and did not attempt drainage.  - Oncology, Dr. Benay Spice consulted. Needs to start tyrosine kinase inhibitor (Dr. Rogue Bussing stated response rates within 1 month neared 80%). Cardiotoxicity is major concern, though his EF is preserved. Also monitor for rash and diarrhea.  - Discussed with RadOnc, Dr. Sondra Come, who is calling radiation tech's back to the hospital for emergent radiation this evening. There is significant conern over his ability to lay flat for 15 minutes. Ok to maximize oxygenation and give morphine if absolutely necessary.   Edema: Due most likely to tumor compression/SVC syndrome and impaired IVC return.  - Very difficult situation with likely preload dependence. Will hold off on IVF's and lasix.  Left lower lobe pneumonia:  - Continue broad coverage. Follow clinical course.  OSA:  - CPAP  HTN, PVCs:  - Continue metoprolol  History of cryptogenic stroke s/p PFO closure: No significant deficits.  - ASA, not taking statin  DVT prophylaxis: SCDs Code Status: Long discussion with patient. Short term intubation ok, certainly no longterm ventilation and no CPR in the event of cardiac arrest. Family Communication: Wife at bedside Disposition Plan: Transfer to [facility] emergently for radiation. Step-down unit status. I have spoken with Dr. Sondra Come and will call him  at the time of patient's transfer, which should be in the next few minutes (RN calling report, carelink updated).     Consultants:   PCCM  Oncology  Interventional radiology  Subjective: Swelling stable, has been tripodding this morning due to respiratory distress. No sleep, couldn't stand CPAP.  Objective: Vitals:   03/21/18 0342 03/21/18 0751 03/21/18 1208 03/21/18 1323  BP:  121/79 117/77 132/66  Pulse: (!) 107 (!) 107 96 98  Resp: 18 18  (!) 23  Temp:  98.3 F (36.8 C) 97.9 F (36.6 C)   TempSrc:  Oral Oral   SpO2: 98% 95% 100% 95%  Weight:      Height:        Intake/Output Summary (Last 24 hours) at 03/21/2018 1718 Last data filed at 03/21/2018 1600 Gross per 24 hour  Intake 239.03 ml  Output -  Net 239.03 ml   Filed Weights   03/21/18 0120  Weight: 77.4 kg (170 lb 9.6 oz)    Gen: 47 y.o. male in some respiratory distress Pulm: Mildly labored tachypnea with absent right-sided breath sounds, left side rhonchi/coarse throughout.  CV: Regular rate and rhythm. No murmur, rub, or gallop. + JVD, 3+ edema x4 extremities GI: Abdomen soft, non-tender, non-distended, with normoactive bowel sounds. No organomegaly or masses felt. Ext: Warm, no deformities Skin: Pleurx catheter site anteriorly c/d/i. Port site c/d/i.  Neuro: Alert and oriented. No focal neurological deficits. Psych: Judgement and insight appear normal. Mood & affect appropriate.   Data Reviewed: I have personally reviewed following labs and imaging studies  CBC: Recent Labs  Lab 03/19/18 1425 03/20/18 2045 03/21/18 0500  WBC 5.3 7.2 6.4  NEUTROABS 3.5  --   --   HGB 11.5* 12.0* 11.2*  HCT 33.2* 34.2* 33.1*  MCV 90.4 89.6 88.7  PLT 182 206 256   Basic Metabolic Panel: Recent Labs  Lab 03/19/18 1425 03/20/18 2045 03/21/18 0500  NA 131* 129* 131*  K 4.0 3.7 3.7  CL 99* 95* 96*  CO2 '23 24 26  '$ GLUCOSE 119* 127* 111*  BUN 21* 23* 18  CREATININE 0.91 0.82 0.88  CALCIUM 9.3 9.3 9.6   GFR: Estimated Creatinine Clearance: 101.6 mL/min (by C-G formula based on SCr of 0.88 mg/dL). Liver Function  Tests: Recent Labs  Lab 03/19/18 1425 03/20/18 2100  AST 35 35  ALT 41 40  ALKPHOS 45 44  BILITOT 0.7 0.5  PROT 5.3* 5.3*  ALBUMIN 2.7* 2.7*   No results for input(s): LIPASE, AMYLASE in the last 168 hours. No results for input(s): AMMONIA in the last 168 hours. Coagulation Profile: Recent Labs  Lab 03/21/18 0520  INR 1.16   Cardiac Enzymes: Recent Labs  Lab 03/20/18 2045  TROPONINI <0.03   BNP (last 3 results) No results for input(s): PROBNP in the last 8760 hours. HbA1C: No results for input(s): HGBA1C in the last 72 hours. CBG: Recent Labs  Lab 03/21/18 0553 03/21/18 1329  GLUCAP 105* 113*   Lipid Profile: No results for input(s): CHOL, HDL, LDLCALC, TRIG, CHOLHDL, LDLDIRECT in the last 72 hours. Thyroid Function Tests: No results for input(s): TSH, T4TOTAL, FREET4, T3FREE, THYROIDAB in the last 72 hours. Anemia Panel: No results for input(s): VITAMINB12, FOLATE, FERRITIN, TIBC, IRON, RETICCTPCT in the last 72 hours. Urine analysis:    Component Value Date/Time   COLORURINE YELLOW (A) 03/20/2018 2256   APPEARANCEUR CLEAR (A) 03/20/2018 2256   LABSPEC 1.021 03/20/2018 2256   PHURINE 6.0 03/20/2018 2256   GLUCOSEU NEGATIVE  03/20/2018 2256   [place] 03/20/2018 2256   [place] 03/20/2018 2256   KETONESUR 5 (A) 03/20/2018 2256   PROTEINUR NEGATIVE 03/20/2018 2256   NITRITE NEGATIVE 03/20/2018 2256   LEUKOCYTESUR NEGATIVE 03/20/2018 2256   Recent Results (from the past 240 hour(s))  Surgical pcr screen     Status: None   Collection Time: 03/21/18 12:11 PM  Result Value Ref Range Status   MRSA, PCR NEGATIVE NEGATIVE Final   Staphylococcus aureus NEGATIVE NEGATIVE Final    Comment: (NOTE) The Xpert SA Assay (FDA approved for NASAL specimens in patients 58 years of age and older), is one component of a comprehensive surveillance program. It is not intended to diagnose infection nor to guide or monitor treatment. Performed at [facility], [place] [address] [place], [place] [address]       Radiology Studies: Ct Angio Chest Pe W Or Wo Contrast  Result Date: 03/21/2018 CLINICAL DATA:  47 year old male with shortness of breath. Patient receiving chemotherapy for right lower lobe lung cancer. EXAM: CT ANGIOGRAPHY CHEST WITH CONTRAST TECHNIQUE: Multidetector CT imaging of the chest was performed using the standard protocol during bolus administration of intravenous contrast. Multiplanar CT image reconstructions and MIPs were obtained to evaluate the vascular anatomy. CONTRAST:  28m ISOVUE-370 IOPAMIDOL (ISOVUE-370) INJECTION 76% COMPARISON:  None. FINDINGS: Cardiovascular: There is no cardiomegaly or pericardial effusion. An Amplatzer occlusive device of patent foramen ovale. The thoracic aorta is unremarkable. The origins of the great vessels of the aortic arch are patent. Evaluation of the pulmonary arteries is limited due to suboptimal enhancement of the peripheral branches. No large or central pulmonary artery embolus identified. Mediastinum/Nodes: There is no adenopathy in the region of the left hilum. Evaluation of the right hilum is limited due to consolidative changes of the right lung. There is shift of the mediastinum into the left hemithorax. Mildly enlarged anterior mediastinal lymph nodes measure up to 15 mm in short axis. A Port-A-Cath is noted with tip in the region of the cavoatrial junction. Lungs/Pleura: There is a large somewhat loculated and complex right pleural effusion and pleural based masses. There is compressive atelectasis and collapse of the right lung. There is only a small aerated portion of the right upper lobe. There are ill-defined hypodense masses in the right chest involving the right lung or pleura which are suboptimally visualized and poorly evaluated. There is mass effect and shift of the mediastinum into the left hemithorax. There is extension of fluid or pleural masses posterior to the mediastinum. A  right-sided pleural base tube is seen with tip in the right apical region. There is mass effect and narrowing of the right upper lobe pulmonary artery and high-grade narrowing and complete occlusion of the right mainstem bronchus. Diffuse confluent ground-glass nodular densities in the left lung primarily involving the left lower lobe and lingula most consistent with pneumonia although metastatic disease is not excluded. There is no pneumothorax. Upper Abdomen: No acute abnormality. Musculoskeletal: No chest wall abnormality. No acute or significant osseous findings. Review of the MIP images confirms the above findings. IMPRESSION: 1. No CT evidence of pulmonary embolism. 2. Large complex pleural effusion and pleural based masses with near complete collapse of the right lung and associated mass effect and shift of the mediastinum into the left hemithorax. There is occlusion of the right mainstem bronchus secondary to mass effect and extrinsic compression. 3. Left lower lobe and lingula airspace densities most consistent with pneumonia. Clinical correlation recommended. Electronically Signed  By: Anner Crete M.D.   On: 03/21/2018 04:46   Dg Chest Port 1 View  Result Date: 03/20/2018 CLINICAL DATA:  Recently diagnosed right upper lobe lung cancer with malignant right pleural effusion status post right VATS with talc pleurodesis. Worsening dyspnea EXAM: PORTABLE CHEST 1 VIEW COMPARISON:  03/12/2018 chest radiograph. FINDINGS: Right internal jugular MediPort terminates at the cavoatrial junction. Right apical chest tube is in place. Worsening near complete opacification of the right hemithorax with minimal residual aeration in the right upper lung. Worsening marked circumferential right pleural thickening/effusion. Worsened left mediastinal shift. Normal heart size. Stable mediastinal contour. No pneumothorax. No left pleural effusion. Increased patchy left retrocardiac opacity. IMPRESSION: 1. Worsening near  complete opacification of the right hemithorax with minimal residual aeration in the right upper lung and worsening diffuse marked right pleural thickening/effusion. 2. Worsening left mediastinal shift with increased patchy left retrocardiac opacity, probably atelectasis. Electronically Signed   By: Ilona Sorrel M.D.   On: 03/20/2018 21:19   Ir US Chest  Result Date: 03/21/2018 CLINICAL DATA:  47 year old male with mediastinal shift and SVC syndrome EXAM: CHEST ULTRASOUND COMPARISON:  CT 03/21/2018, 03/01/2018, 02/21/2018 FINDINGS: Limited ultrasound images were performed in order to locate potential fluid for aspiration/drainage. Ultrasound survey of the anterior and posterior chest demonstrates tumor tissue occupying space around the lungs. Drainage deferred at this time. IMPRESSION: Limited ultrasound images of the chest demonstrate no localized fluid collection for drainage. Drainage deferred at this time. Electronically Signed   By: Corrie Mckusick D.O.   On: 03/21/2018 16:57    Scheduled Meds: . aspirin EC  81 mg Oral Daily  . mouth rinse  15 mL Mouth Rinse BID  . metoprolol tartrate  25 mg Oral BID  . morphine  15 mg Oral Q12H  . multivitamin with minerals  1 tablet Oral Daily  . osimertinib mesylate  80 mg Oral Daily  . senna-docusate  1 tablet Oral Daily   Continuous Infusions: . sodium chloride Stopped (03/21/18 1507)  . piperacillin-tazobactam (ZOSYN)  IV 12.5 mL/hr at 03/21/18 1600  . vancomycin 150 mL/hr at 03/21/18 1600     LOS: 0 days   Time spent: 35 minutes.  Patrecia Pour, MD [facility] [email or web address] Password [facility] 03/21/2018, 5:18 PM

## 2018-03-21 NOTE — Progress Notes (Signed)
Patient transferred to [facility] via [place]. Family at he bedside.

## 2018-03-21 NOTE — Progress Notes (Signed)
IP PROGRESS NOTE  Subjective:   [facility]en [facility] was diagnosed with non-small cell lung cancer after presenting with a right lung mass and pleural effusion last month.  He underwent a right thoracoscopy with talc pleurodesis and pleural biopsy, Pleurx catheter insertion, and placement of a Port-A-Cath on 02/23/2018.  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  The immunohistochemical stains supported the diagnosis of a lung primary. [facility] has been followed by Dr. Rogue Bussing.  He completed cycle 1 Taxol/carboplatin chemotherapy on 03/13/2018.  He reports tolerating the chemotherapy well. He was seen in consultation by Dr. Baruch Gouty with the plan to deliver palliative radiation to the right chest.  He did not start radiation. 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  He was prescribed osimertinib.  He received a first dose today.  Ruben Eaton presented to the emergency room yesterday with increased dyspnea and leg swelling.  He reports dyspnea progressing over 3-4 days.  He has also developed neck/arm swelling.  He was admitted to [facility] A CT of the chest today revealed no pericardial effusion.  There is shift of the mediastinum into the left hemithorax.  There is a large loculated and complex right pleural effusion and pleural-based masses.  There is compressive atelectasis of the right lung.  There is a right pleural catheter.  There is mass-effect and narrowing of the right upper lobe pulmonary artery and high-grade narrowing of the right mainstem bronchus.  Diffuse groundglass nodular densities in the left lung.  Critical care medicine has been consulted.  Objective: Vital signs in last 24 hours: Blood pressure 132/66, pulse 98, temperature 97.9 F (36.6 C), temperature source Oral, resp. rate (!) 23, height '5\' 6"'$  (1.676 m), weight 170 lb 9.6 oz (77.4 kg), SpO2 95 %.  Intake/Output from previous  day: 06/25 0701 - 06/26 0700 In: 100 [IV Piggyback:100] Out: -   Physical Exam:  HEENT: No thrush Lungs: Decreased breath sounds throughout the right chest, inspiratory rales in the left lower chest, wheezing in the left chest Cardiac: Irregular Abdomen: Nontender, no hepatosplenomegaly Extremities: Edema in both arms and legs.  There is neck edema with JVD   Portacath/PICC-without erythema  Lab Results: Recent Labs    03/20/18 2045 03/21/18 0500  WBC 7.2 6.4  HGB 12.0* 11.2*  HCT 34.2* 33.1*  PLT 206 198    BMET Recent Labs    03/20/18 2045 03/21/18 0500  NA 129* 131*  K 3.7 3.7  CL 95* 96*  CO2 24 26  GLUCOSE 127* 111*  BUN 23* 18  CREATININE 0.82 0.88  CALCIUM 9.3 9.6     Studies/Results: Ct Angio Chest Pe W Or Wo Contrast  Result Date: 03/21/2018 CLINICAL DATA:  47 year old male with shortness of breath. Patient receiving chemotherapy for right lower lobe lung cancer. EXAM: CT ANGIOGRAPHY CHEST WITH CONTRAST TECHNIQUE: Multidetector CT imaging of the chest was performed using the standard protocol during bolus administration of intravenous contrast. Multiplanar CT image reconstructions and MIPs were obtained to evaluate the vascular anatomy. CONTRAST:  12m ISOVUE-370 IOPAMIDOL (ISOVUE-370) INJECTION 76% COMPARISON:  None. FINDINGS: Cardiovascular: There is no cardiomegaly or pericardial effusion. An Amplatzer occlusive device of patent foramen ovale. The thoracic aorta is unremarkable. The origins of the great vessels of the aortic arch are patent. Evaluation of the pulmonary arteries is limited due to suboptimal enhancement of the peripheral branches. No large or central pulmonary artery embolus identified. Mediastinum/Nodes: There is  no adenopathy in the region of the left hilum. Evaluation of the right hilum is limited due to consolidative changes of the right lung. There is shift of the mediastinum into the left hemithorax. Mildly enlarged anterior mediastinal  lymph nodes measure up to 15 mm in short axis. A Port-A-Cath is noted with tip in the region of the cavoatrial junction. Lungs/Pleura: There is a large somewhat loculated and complex right pleural effusion and pleural based masses. There is compressive atelectasis and collapse of the right lung. There is only a small aerated portion of the right upper lobe. There are ill-defined hypodense masses in the right chest involving the right lung or pleura which are suboptimally visualized and poorly evaluated. There is mass effect and shift of the mediastinum into the left hemithorax. There is extension of fluid or pleural masses posterior to the mediastinum. A right-sided pleural base tube is seen with tip in the right apical region. There is mass effect and narrowing of the right upper lobe pulmonary artery and high-grade narrowing and complete occlusion of the right mainstem bronchus. Diffuse confluent ground-glass nodular densities in the left lung primarily involving the left lower lobe and lingula most consistent with pneumonia although metastatic disease is not excluded. There is no pneumothorax. Upper Abdomen: No acute abnormality. Musculoskeletal: No chest wall abnormality. No acute or significant osseous findings. Review of the MIP images confirms the above findings. IMPRESSION: 1. No CT evidence of pulmonary embolism. 2. Large complex pleural effusion and pleural based masses with near complete collapse of the right lung and associated mass effect and shift of the mediastinum into the left hemithorax. There is occlusion of the right mainstem bronchus secondary to mass effect and extrinsic compression. 3. Left lower lobe and lingula airspace densities most consistent with pneumonia. Clinical correlation recommended. Electronically Signed   By: Anner Crete M.D.   On: 03/21/2018 04:46   Dg Chest Port 1 View  Result Date: 03/20/2018 CLINICAL DATA:  Recently diagnosed right upper lobe lung cancer with  malignant right pleural effusion status post right VATS with talc pleurodesis. Worsening dyspnea EXAM: PORTABLE CHEST 1 VIEW COMPARISON:  03/12/2018 chest radiograph. FINDINGS: Right internal jugular MediPort terminates at the cavoatrial junction. Right apical chest tube is in place. Worsening near complete opacification of the right hemithorax with minimal residual aeration in the right upper lung. Worsening marked circumferential right pleural thickening/effusion. Worsened left mediastinal shift. Normal heart size. Stable mediastinal contour. No pneumothorax. No left pleural effusion. Increased patchy left retrocardiac opacity. IMPRESSION: 1. Worsening near complete opacification of the right hemithorax with minimal residual aeration in the right upper lung and worsening diffuse marked right pleural thickening/effusion. 2. Worsening left mediastinal shift with increased patchy left retrocardiac opacity, probably atelectasis. Electronically Signed   By: Ilona Sorrel M.D.   On: 03/20/2018 21:19    Medications: I have reviewed the patient's current medications.  Assessment/Plan:  1.  Non-small cell lung cancer, status post a pleural biopsy 02/23/2018 confirming a high-grade carcinoma of lung origin  Staging PET scan 02/25/3761- hypermetabolic anterior right upper lobe mass, hypermetabolic soft tissue throughout the right pleural space, no evidence of distant metastatic disease  Cycle 1 Taxol/carboplatin 03/13/2018  CT chest 03/12/2018 increase in extensive tumor in the right chest with shift of the mediastinum to the left  CT chest 03/21/2018- large complex pleural effusion and pleural-based masses with near complete collapse of the right lung with shift of the mediastinum into the left chest, occlusion of the right mainstem bronchus,  left lung airspace densities  Osimertinib started 03/21/2018  2.  Respiratory failure secondary to #1  3.  CVA 09/07/2016 in setting of a PFO  4.  PFO closure January  2018  5.  Hypertension   Ruben Eaton has been diagnosed with advanced stage non-small cell lung cancer.  He is admitted with respiratory failure in the setting of extensive disease in the chest with right lung collapse.  He may have tumor spread versus pneumonia in the left chest.  He is now at day 9 following cycle 1 Taxol/carboplatin.  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  There is a high chance of a clinical response with tyrosine kinase inhibitor therapy.  He started osimertinib today.  Hopefully the tumor will respond to the osimertinib and chemotherapy over the next few weeks.  He is critically ill with respiratory failure and SVC syndrome.  He may benefit from emergent radiation to the chest.  I discussed the case with Dr. Sondra Come and critical care medicine.  Radiation oncology will see him today.  I reviewed the CT/PET images and updated his wife at the bedside.  Recommendations: 1.  Management of respiratory failure per critical care medicine 2.  Radiation oncology consult today 3.  Continue daily osimertinib 4.  Oncology will continue following him in the hospital and outpatient follow-up will be scheduled with Dr. Rogue Bussing.   LOS: 0 days   Betsy Coder, MD   03/21/2018, 1:56 PM

## 2018-03-21 NOTE — ED Notes (Signed)
Attempted to call report to [facility], RN unavailable at this time.  Name and phone number left with secretary to receive call back.

## 2018-03-21 NOTE — Progress Notes (Signed)
PATIENT TAKEN FOR PROCEDURE AT [facility] VIA WHEELCHAIR WITH RADIOLOGY TEAM ON MONITOR AND 4 L O2; NO SIGNIFICANT DISTRESS NOTED. FAMILY ARRIVED S/P PATIENTS DEPARTURE; SHOWN TO PT'S ROOM. INTRODUCED TO JESSICA, RN. BRIEF POC DISCUSSED WITH THE WIFE, DAUGHTER, AND SON-IN-LAW WITH ALLOWANCE FOR ALL QUESTIONS TO BE ASKED AND ANSWERED; FAMILY AGREE WITH POC. FAMILY DENIES NEEDS AT THIS TIME.

## 2018-03-21 NOTE — Progress Notes (Signed)
Interventional Radiology Progress Note  Patient in [place] for possible right chest drainage.    Serial CT scans show rapidly increasing volume of soft tissue in the right chest.  This is resulting in right to left shift of the mediastinum.    Patient has swollen upper and lower extremities, and has SOB on supine position.   Most recent CT on 6/26 shows possible fluid in the chest, though the timing of the contrast bolus is ill-timed for a sensitive evaluation of the pleural disease.  The bolus timing is optimized for the vasculature.   [place] was performed of the chest as a test to identify any fluid for drainage.   No fluid is identified by [place].  The pleural disease appears to be solid tissue with internal flow on directed duplex on [place] survey.  Very small fluid is present at the costo-phrenic sulcus.    Discussed results with family.  We will defer drain attempt at this time.  My impression is that there will be low yield with drain attempt, with risk > any benefit.   Discussed the results with Dr. Nelda Marseille, Dr. Benay Spice, and Dr. Sondra Come.      Signed,  Dulcy Fanny. Earleen Newport, DO

## 2018-03-21 NOTE — Progress Notes (Signed)
Patient arrived from [facility] per [facility],SOB on exertion,hooked to cardiac monitor and CCMD notified.Oriented in the room and staff.Vital sign taken. Dr. On call notified.

## 2018-03-22 ENCOUNTER — Ambulatory Visit
Admit: 2018-03-22 | Discharge: 2018-03-22 | Disposition: A | Discharge status: Home / Self Care | Payer: No Typology Code available for payment source | Attending: Radiation Oncology | Admitting: Radiation Oncology

## 2018-03-22 ENCOUNTER — Ambulatory Visit: Payer: No Typology Code available for payment source

## 2018-03-22 DIAGNOSIS — C3431 Malignant neoplasm of lower lobe, right bronchus or lung: Secondary | ICD-10-CM

## 2018-03-22 DIAGNOSIS — C3492 Malignant neoplasm of unspecified part of left bronchus or lung: Secondary | ICD-10-CM

## 2018-03-22 DIAGNOSIS — C3481 Malignant neoplasm of overlapping sites of right bronchus and lung: Secondary | ICD-10-CM

## 2018-03-22 DIAGNOSIS — R601 Generalized edema: Secondary | ICD-10-CM

## 2018-03-22 DIAGNOSIS — Z9989 Dependence on other enabling machines and devices: Secondary | ICD-10-CM

## 2018-03-22 DIAGNOSIS — R9389 Abnormal findings on diagnostic imaging of other specified body structures: Secondary | ICD-10-CM

## 2018-03-22 DIAGNOSIS — G4733 Obstructive sleep apnea (adult) (pediatric): Secondary | ICD-10-CM

## 2018-03-22 LAB — CBC
HCT: 33.2 % — ABNORMAL LOW (ref 39.0–52.0)
Hemoglobin: 11.5 g/dL — ABNORMAL LOW (ref 13.0–17.0)
MCH: 30.7 pg (ref 26.0–34.0)
MCHC: 34.6 g/dL (ref 30.0–36.0)
MCV: 88.8 fL (ref 78.0–100.0)
Platelets: 217 10*3/uL (ref 150–400)
RBC: 3.74 MIL/uL — ABNORMAL LOW (ref 4.22–5.81)
RDW: 12.3 % (ref 11.5–15.5)
WBC: 6.3 10*3/uL (ref 4.0–10.5)

## 2018-03-22 LAB — GLUCOSE, CAPILLARY
GLUCOSE-CAPILLARY: 115 mg/dL — AB (ref 70–99)
Glucose-Capillary: 106 mg/dL — ABNORMAL HIGH (ref 70–99)
Glucose-Capillary: 110 mg/dL — ABNORMAL HIGH (ref 70–99)

## 2018-03-22 LAB — BASIC METABOLIC PANEL
Anion gap: 5 (ref 5–15)
BUN: 24 mg/dL — AB (ref 6–20)
CALCIUM: 9.7 mg/dL (ref 8.9–10.3)
CO2: 29 mmol/L (ref 22–32)
CREATININE: 0.9 mg/dL (ref 0.61–1.24)
Chloride: 100 mmol/L (ref 98–111)
GFR calc Af Amer: >60 mL/min (ref >60)
GLUCOSE: 105 mg/dL — AB (ref 70–99)
Potassium: 3.9 mmol/L (ref 3.5–5.1)
Sodium: 134 mmol/L — ABNORMAL LOW (ref 135–145)

## 2018-03-22 LAB — RETICULOCYTES
RBC.: 3.53 MIL/uL — AB (ref 4.22–5.81)
RETIC CT PCT: 1.2 % (ref 0.4–3.1)
Retic Count, Absolute: 42.4 10*3/uL (ref 19.0–186.0)

## 2018-03-22 LAB — IRON AND TIBC
Iron: 28 ug/dL — ABNORMAL LOW (ref 45–182)
SATURATION RATIOS: 12 % — AB (ref 17.9–39.5)
TIBC: 233 ug/dL — ABNORMAL LOW (ref 250–450)
UIBC: 205 ug/dL

## 2018-03-22 LAB — FERRITIN: Ferritin: 224 ng/mL (ref 24–336)

## 2018-03-22 LAB — VANCOMYCIN, TROUGH: VANCOMYCIN TR: 7 ug/mL — AB (ref 15–20)

## 2018-03-22 MED ORDER — POLYETHYLENE GLYCOL 3350 17 G PO PACK
17.0000 g | PACK | Freq: Every day | ORAL | Status: DC
Start: 2018-03-22 — End: 2018-03-27
  Administered 2018-03-22: 17 g via ORAL
  Filled 2018-03-22 (×3): qty 1

## 2018-03-22 MED ORDER — LORAZEPAM 1 MG PO TABS
1.0000 mg | ORAL_TABLET | Freq: Every evening | ORAL | Status: DC | PRN
  Administered 2018-03-22: 1 mg via ORAL
  Filled 2018-03-22: qty 1

## 2018-03-22 MED ORDER — DM-GUAIFENESIN ER 30-600 MG PO TB12
1.0000 | ORAL_TABLET | Freq: Two times a day (BID) | ORAL | Status: DC
  Administered 2018-03-22 – 2018-03-27 (×10): 1 via ORAL
  Filled 2018-03-22 (×10): qty 1

## 2018-03-22 MED ORDER — CHLORHEXIDINE GLUCONATE CLOTH 2 % EX PADS: 6.0000 | MEDICATED_PAD | Freq: Every day | CUTANEOUS | Status: DC

## 2018-03-22 MED ORDER — ENOXAPARIN SODIUM 40 MG/0.4ML ~~LOC~~ SOLN
40.0000 mg | Freq: Every day | SUBCUTANEOUS | Status: DC
  Administered 2018-03-23 – 2018-03-27 (×4): 40 mg via SUBCUTANEOUS
  Filled 2018-03-22 (×6): qty 0.4

## 2018-03-22 MED ORDER — HYDROCOD POLST-CPM POLST ER 10-8 MG/5ML PO SUER
5.0000 mL | Freq: Once | ORAL | Status: AC
  Administered 2018-03-22: 5 mL via ORAL
  Filled 2018-03-22: qty 5

## 2018-03-22 MED ORDER — VANCOMYCIN HCL IN DEXTROSE 1-5 GM/200ML-% IV SOLN
1000.0000 mg | Freq: Two times a day (BID) | INTRAVENOUS | Status: DC
  Administered 2018-03-22 (×2): 1000 mg via INTRAVENOUS
  Filled 2018-03-22 (×3): qty 200

## 2018-03-22 NOTE — Progress Notes (Signed)
PROGRESS NOTE    Ruben Eaton   HGD:[id number]  DOB: 28-Jan-1971  DOA: 03/21/2018 PCP: Ruben Body, MD   Brief Narrative:  Ruben Eaton 47 y.o. male with history of recently diagnosed non-small cell lung cancer with rapid progression, pleural effusion s/p pleurodesis and Pleurx catheter placement cryptogenic stroke with PFO status post closure, hypertension, PVCs, sleep apnea who presents to [facility].  He received chemo and has since developed dyspnea which is worse on laying flat and swelling of arms and legs. In the ER chest x-ray was showing near opacification of the right hemithorax and worsening left mediastinal shift. The catheter was no longer draining. He was transferred to [facility] for CT surgery consult. CT chest revealed a Large complex pleural effusion and pleural based masses with near complete collapse of the right lung and associated mass effect and shift of the mediastinum into the left hemithorax. There is occlusion of the right mainstem bronchus secondary to mass effect and extrinsic compression.  Left lower lobe and lingula airspace densities most consistent with pneumonia.  Dr Roxy Manns evaluated him and decided he was a poor candidate for re-do Vats. It was decided to transfer him to [facility] for urgent radiation.  Subjective: He feels well today with not much dyspnea or cough. No nausea or vomiting. No constipation of trouble urinating. He tolerated radiation quite well yesterday.    Assessment & Plan:   Principal Problem:  Acute respiratory failure with hypoxia Primary cancer of right lower lobe of lung - non small cell- 02/23/18- no distant mets SVC syndrome Right lung collapse and midline shift - oncology managing- started radiation on 6/26 and will receive it again today - cont Osimertinib - hold off on diuretics  Active Problems: ? LLL pneumonia - on Vanc and Zosyn since 6/26- will give 5 day course    History of stroke/ PFO - s/p PFO  coslure on 12/17     OSA on CPAP   HTN - cont Metoprolol  Anemia - obtain anemia panel tomorrow   DVT prophylaxis: Lovenox Code Status: full code Family Communication: wife Disposition Plan: cont radiation - follow in SDU Consultants:   Oncology- medical and radiation  CT surgery   PCCM  IR Procedures:    radiation treatment Antimicrobials:  Anti-infectives (From admission, onward)   Start     Dose/Rate Route Frequency Ordered Stop   03/22/18 1000  vancomycin (VANCOCIN) IVPB 1000 mg/200 mL premix     1,000 mg 200 mL/hr over 60 Minutes Intravenous Every 12 hours 03/22/18 0746     03/22/18 0600  vancomycin (VANCOCIN) IVPB 1000 mg/200 mL premix  Status:  Discontinued     1,000 mg 200 mL/hr over 60 Minutes Intravenous Every 12 hours 03/21/18 1942 03/22/18 0746   03/21/18 1330  piperacillin-tazobactam (ZOSYN) IVPB 3.375 g     3.375 g 12.5 mL/hr over 240 Minutes Intravenous Every 8 hours 03/21/18 0509     03/21/18 1330  vancomycin (VANCOCIN) IVPB 750 mg/150 ml premix  Status:  Discontinued     750 mg 150 mL/hr over 60 Minutes Intravenous Every 8 hours 03/21/18 0509 03/21/18 1931   03/21/18 0515  piperacillin-tazobactam (ZOSYN) IVPB 3.375 g     3.375 g 100 mL/hr over 30 Minutes Intravenous  Once 03/21/18 0503 03/21/18 0635   03/21/18 0515  vancomycin (VANCOCIN) 1,500 mg in sodium chloride 0.9 % 500 mL IVPB     1,500 mg 250 mL/hr over 120 Minutes Intravenous  Once 03/21/18 0503  03/21/18 0732       Objective: Vitals:   03/22/18 0800 03/22/18 0900 03/22/18 0942 03/22/18 1000  BP:   132/73 120/70  Pulse: (!) 102 (!) 103 (!) 121 100  Resp: 19 20 (!) 25 20  Temp: (!) 97.5 F (36.4 C)     TempSrc: Oral     SpO2: 97% 96% 95% 95%  Weight:      Height:        Intake/Output Summary (Last 24 hours) at 03/22/2018 1049 Last data filed at 03/22/2018 1000 Gross per 24 hour  Intake 857.22 ml  Output -  Net 857.22 ml   Filed Weights   03/21/18 0120  Weight: 77.4 kg  (170 lb 9.6 oz)    Examination: General exam: Appears comfortable  HEENT: PERRLA, oral mucosa moist, no sclera icterus or thrush Respiratory system: Clear to auscultation. Respiratory effort normal. Pulse ox 94% on 4 L Cardiovascular system: S1 & S2 heard, RRR.   Gastrointestinal system: Abdomen soft, non-tender, nondistended. Normal bowel sound. No organomegaly Central nervous system: Alert and oriented. No focal neurological deficits. Extremities: No cyanosis, clubbing - swelling of arms and legs noted Skin: No rashes or ulcers Psychiatry:  Mood & affect appropriate.     Data Reviewed: I have personally reviewed following labs and imaging studies  CBC: Recent Labs  Lab 03/19/18 1425 03/20/18 2045 03/21/18 0500 03/22/18 0526  WBC 5.3 7.2 6.4 6.3  NEUTROABS 3.5  --   --   --   HGB 11.5* 12.0* 11.2* 11.5*  HCT 33.2* 34.2* 33.1* 33.2*  MCV 90.4 89.6 88.7 88.8  PLT 182 206 198 885   Basic Metabolic Panel: Recent Labs  Lab 03/19/18 1425 03/20/18 2045 03/21/18 0500 03/22/18 0526  NA 131* 129* 131* 134*  K 4.0 3.7 3.7 3.9  CL 99* 95* 96* 100  CO2 23 24 26 29   GLUCOSE 119* 127* 111* 105*  BUN 21* 23* 18 24*  CREATININE 0.91 0.82 0.88 0.90  CALCIUM 9.3 9.3 9.6 9.7   GFR: Estimated Creatinine Clearance: 99.3 mL/min (by C-G formula based on SCr of 0.9 mg/dL). Liver Function Tests: Recent Labs  Lab 03/19/18 1425 03/20/18 2100  AST 35 35  ALT 41 40  ALKPHOS 45 44  BILITOT 0.7 0.5  PROT 5.3* 5.3*  ALBUMIN 2.7* 2.7*   No results for input(s): LIPASE, AMYLASE in the last 168 hours. No results for input(s): AMMONIA in the last 168 hours. Coagulation Profile: Recent Labs  Lab 03/21/18 0520  INR 1.16   Cardiac Enzymes: Recent Labs  Lab 03/20/18 2045  TROPONINI <0.03   BNP (last 3 results) No results for input(s): PROBNP in the last 8760 hours. HbA1C: No results for input(s): HGBA1C in the last 72 hours. CBG: Recent Labs  Lab 03/21/18 0553  03/21/18 1329 03/21/18 2303  GLUCAP 105* 113* 113*   Lipid Profile: No results for input(s): CHOL, HDL, LDLCALC, TRIG, CHOLHDL, LDLDIRECT in the last 72 hours. Thyroid Function Tests: No results for input(s): TSH, T4TOTAL, FREET4, T3FREE, THYROIDAB in the last 72 hours. Anemia Panel: No results for input(s): VITAMINB12, FOLATE, FERRITIN, TIBC, IRON, RETICCTPCT in the last 72 hours. Urine analysis:    Component Value Date/Time   COLORURINE YELLOW (A) 03/20/2018 2256   APPEARANCEUR CLEAR (A) 03/20/2018 2256   LABSPEC 1.021 03/20/2018 2256   PHURINE 6.0 03/20/2018 2256   GLUCOSEU NEGATIVE 03/20/2018 2256   HGBUR NEGATIVE 03/20/2018 2256   BILIRUBINUR NEGATIVE 03/20/2018 2256   KETONESUR 5 (A) 03/20/2018  [place] 03/20/2018 2256   NITRITE NEGATIVE 03/20/2018 2256   LEUKOCYTESUR NEGATIVE 03/20/2018 2256   Sepsis Labs: @LABRCNTIP (procalcitonin:4,lacticidven:4) ) Recent Results (from the past 240 hour(s))  Surgical pcr screen     Status: None   Collection Time: 03/21/18 12:11 PM  Result Value Ref Range Status   MRSA, PCR NEGATIVE NEGATIVE Final   Staphylococcus aureus NEGATIVE NEGATIVE Final    Comment: (NOTE) The Xpert SA Assay (FDA approved for NASAL specimens in patients 77 years of age and older), is one component of a comprehensive surveillance program. It is not intended to diagnose infection nor to guide or monitor treatment. Performed at [facility], [place] [address] [place], [place] [address]          Radiology Studies: Ct Angio Chest Pe W Or Wo Contrast  Result Date: 03/21/2018 CLINICAL DATA:  47 year old male with shortness of breath. Patient receiving chemotherapy for right lower lobe lung cancer. EXAM: CT ANGIOGRAPHY CHEST WITH CONTRAST TECHNIQUE: Multidetector CT imaging of the chest was performed using the standard protocol during bolus administration of intravenous contrast. Multiplanar CT image reconstructions and MIPs were obtained  to evaluate the vascular anatomy. CONTRAST:  33mL ISOVUE-370 IOPAMIDOL (ISOVUE-370) INJECTION 76% COMPARISON:  None. FINDINGS: Cardiovascular: There is no cardiomegaly or pericardial effusion. An Amplatzer occlusive device of patent foramen ovale. The thoracic aorta is unremarkable. The origins of the great vessels of the aortic arch are patent. Evaluation of the pulmonary arteries is limited due to suboptimal enhancement of the peripheral branches. No large or central pulmonary artery embolus identified. Mediastinum/Nodes: There is no adenopathy in the region of the left hilum. Evaluation of the right hilum is limited due to consolidative changes of the right lung. There is shift of the mediastinum into the left hemithorax. Mildly enlarged anterior mediastinal lymph nodes measure up to 15 mm in short axis. A Port-A-Cath is noted with tip in the region of the cavoatrial junction. Lungs/Pleura: There is a large somewhat loculated and complex right pleural effusion and pleural based masses. There is compressive atelectasis and collapse of the right lung. There is only a small aerated portion of the right upper lobe. There are ill-defined hypodense masses in the right chest involving the right lung or pleura which are suboptimally visualized and poorly evaluated. There is mass effect and shift of the mediastinum into the left hemithorax. There is extension of fluid or pleural masses posterior to the mediastinum. A right-sided pleural base tube is seen with tip in the right apical region. There is mass effect and narrowing of the right upper lobe pulmonary artery and high-grade narrowing and complete occlusion of the right mainstem bronchus. Diffuse confluent ground-glass nodular densities in the left lung primarily involving the left lower lobe and lingula most consistent with pneumonia although metastatic disease is not excluded. There is no pneumothorax. Upper Abdomen: No acute abnormality. Musculoskeletal: No chest  wall abnormality. No acute or significant osseous findings. Review of the MIP images confirms the above findings. IMPRESSION: 1. No CT evidence of pulmonary embolism. 2. Large complex pleural effusion and pleural based masses with near complete collapse of the right lung and associated mass effect and shift of the mediastinum into the left hemithorax. There is occlusion of the right mainstem bronchus secondary to mass effect and extrinsic compression. 3. Left lower lobe and lingula airspace densities most consistent with pneumonia. Clinical correlation recommended. Electronically Signed   By: Anner Crete M.D.   On: 03/21/2018 04:46  Dg Chest Port 1 View  Result Date: 03/20/2018 CLINICAL DATA:  Recently diagnosed right upper lobe lung cancer with malignant right pleural effusion status post right VATS with talc pleurodesis. Worsening dyspnea EXAM: PORTABLE CHEST 1 VIEW COMPARISON:  03/12/2018 chest radiograph. FINDINGS: Right internal jugular MediPort terminates at the cavoatrial junction. Right apical chest tube is in place. Worsening near complete opacification of the right hemithorax with minimal residual aeration in the right upper lung. Worsening marked circumferential right pleural thickening/effusion. Worsened left mediastinal shift. Normal heart size. Stable mediastinal contour. No pneumothorax. No left pleural effusion. Increased patchy left retrocardiac opacity. IMPRESSION: 1. Worsening near complete opacification of the right hemithorax with minimal residual aeration in the right upper lung and worsening diffuse marked right pleural thickening/effusion. 2. Worsening left mediastinal shift with increased patchy left retrocardiac opacity, probably atelectasis. Electronically Signed   By: Ilona Sorrel M.D.   On: 03/20/2018 21:19   Ir US Chest  Result Date: 03/21/2018 CLINICAL DATA:  47 year old male with mediastinal shift and SVC syndrome EXAM: CHEST ULTRASOUND COMPARISON:  CT 03/21/2018,  03/01/2018, 02/21/2018 FINDINGS: Limited ultrasound images were performed in order to locate potential fluid for aspiration/drainage. Ultrasound survey of the anterior and posterior chest demonstrates tumor tissue occupying space around the lungs. Drainage deferred at this time. IMPRESSION: Limited ultrasound images of the chest demonstrate no localized fluid collection for drainage. Drainage deferred at this time. Electronically Signed   By: Corrie Mckusick D.O.   On: 03/21/2018 16:57      Scheduled Meds: . aspirin EC  81 mg Oral Daily  . Chlorhexidine Gluconate Cloth  6 each Topical Daily  . mouth rinse  15 mL Mouth Rinse BID  . metoprolol tartrate  25 mg Oral BID  . morphine  15 mg Oral Q12H  . multivitamin with minerals  1 tablet Oral Daily  . osimertinib mesylate  80 mg Oral Daily  . polyethylene glycol  17 g Oral Daily  . senna-docusate  1 tablet Oral Daily   Continuous Infusions: . sodium chloride 10 mL/hr at 03/22/18 1000  . piperacillin-tazobactam (ZOSYN)  IV Stopped (03/22/18 0925)  . vancomycin 200 mL/hr at 03/22/18 1000     LOS: 1 day    Time spent in minutes: 35    Debbe Odea, MD [facility] Pager: [email or web address] Password [facility] 03/22/2018, 10:49 AM

## 2018-03-22 NOTE — Progress Notes (Signed)
[facility] Therapy Treatment Record Phone [phone]   Radiation Therapy was administered to Ruben Eaton on: 03/22/2018  2:40 PM and was treatment # 3 out of a planned course of 5 treatments.  Radiation Treatment  1). Beam photons with 6-10 energy  2). Brachytherapy None  3). Stereotactic Radiosurgery None  4). Other Radiation       Tresten Pantoja A Lyndel Dancel, RT (T)

## 2018-03-22 NOTE — Progress Notes (Signed)
[facility] Therapy Treatment Record Phone [phone]   Radiation Therapy was administered to Ruben Eaton on: 03/22/2018  8:28 AM and was treatment # 2 out of a planned course of 5 treatments.  Radiation Treatment  1). Beam Photons 6-10 MeV  2). Brachytherapy None  3). Stereotactic Radiosurgery None  4). Other Radiation None     Trudee Kuster, RT (T)

## 2018-03-22 NOTE — Progress Notes (Addendum)
IP PROGRESS NOTE  Subjective:   Ruben Eaton reports having slightly less dyspnea today.  He continues to have orthopnea.  He completed a first treatment with radiation last night.  Objective: Vital signs in last 24 hours: Blood pressure 113/76, pulse 91, temperature (!) 97.5 F (36.4 C), temperature source Oral, resp. rate 13, height '5\' 6"'$  (1.676 m), weight 170 lb 9.6 oz (77.4 kg), SpO2 94 %.  Intake/Output from previous day: 06/26 0701 - 06/27 0700 In: 202 [I.V.:19.3; IV Piggyback:182.7] Out: -   Physical Exam:  HEENT: No thrush Lungs: Decreased breath sounds throughout the right chest Cardiac: Regular rhythm with premature beats Abdomen: Nontender, no hepatosplenomegaly Extremities: Edema in both arms and legs.  There is neck edema with JVD- partially improved   Portacath/PICC-without erythema  Lab Results: Recent Labs    03/21/18 0500 03/22/18 0526  WBC 6.4 6.3  HGB 11.2* 11.5*  HCT 33.1* 33.2*  PLT 198 217    BMET Recent Labs    03/21/18 0500 03/22/18 0526  NA 131* 134*  K 3.7 3.9  CL 96* 100  CO2 26 29  GLUCOSE 111* 105*  BUN 18 24*  CREATININE 0.88 0.90  CALCIUM 9.6 9.7     Studies/Results: Ct Angio Chest Pe W Or Wo Contrast  Result Date: 03/21/2018 CLINICAL DATA:  47 year old male with shortness of breath. Patient receiving chemotherapy for right lower lobe lung cancer. EXAM: CT ANGIOGRAPHY CHEST WITH CONTRAST TECHNIQUE: Multidetector CT imaging of the chest was performed using the standard protocol during bolus administration of intravenous contrast. Multiplanar CT image reconstructions and MIPs were obtained to evaluate the vascular anatomy. CONTRAST:  37m ISOVUE-370 IOPAMIDOL (ISOVUE-370) INJECTION 76% COMPARISON:  None. FINDINGS: Cardiovascular: There is no cardiomegaly or pericardial effusion. An Amplatzer occlusive device of patent foramen ovale. The thoracic aorta is unremarkable. The origins of the great vessels of the aortic arch are patent.  Evaluation of the pulmonary arteries is limited due to suboptimal enhancement of the peripheral branches. No large or central pulmonary artery embolus identified. Mediastinum/Nodes: There is no adenopathy in the region of the left hilum. Evaluation of the right hilum is limited due to consolidative changes of the right lung. There is shift of the mediastinum into the left hemithorax. Mildly enlarged anterior mediastinal lymph nodes measure up to 15 mm in short axis. A Port-A-Cath is noted with tip in the region of the cavoatrial junction. Lungs/Pleura: There is a large somewhat loculated and complex right pleural effusion and pleural based masses. There is compressive atelectasis and collapse of the right lung. There is only a small aerated portion of the right upper lobe. There are ill-defined hypodense masses in the right chest involving the right lung or pleura which are suboptimally visualized and poorly evaluated. There is mass effect and shift of the mediastinum into the left hemithorax. There is extension of fluid or pleural masses posterior to the mediastinum. A right-sided pleural base tube is seen with tip in the right apical region. There is mass effect and narrowing of the right upper lobe pulmonary artery and high-grade narrowing and complete occlusion of the right mainstem bronchus. Diffuse confluent ground-glass nodular densities in the left lung primarily involving the left lower lobe and lingula most consistent with pneumonia although metastatic disease is not excluded. There is no pneumothorax. Upper Abdomen: No acute abnormality. Musculoskeletal: No chest wall abnormality. No acute or significant osseous findings. Review of the MIP images confirms the above findings. IMPRESSION: 1. No CT evidence of pulmonary embolism.  2. Large complex pleural effusion and pleural based masses with near complete collapse of the right lung and associated mass effect and shift of the mediastinum into the left  hemithorax. There is occlusion of the right mainstem bronchus secondary to mass effect and extrinsic compression. 3. Left lower lobe and lingula airspace densities most consistent with pneumonia. Clinical correlation recommended. Electronically Signed   By: Anner Crete M.D.   On: 03/21/2018 04:46   Dg Chest Port 1 View  Result Date: 03/20/2018 CLINICAL DATA:  Recently diagnosed right upper lobe lung cancer with malignant right pleural effusion status post right VATS with talc pleurodesis. Worsening dyspnea EXAM: PORTABLE CHEST 1 VIEW COMPARISON:  03/12/2018 chest radiograph. FINDINGS: Right internal jugular MediPort terminates at the cavoatrial junction. Right apical chest tube is in place. Worsening near complete opacification of the right hemithorax with minimal residual aeration in the right upper lung. Worsening marked circumferential right pleural thickening/effusion. Worsened left mediastinal shift. Normal heart size. Stable mediastinal contour. No pneumothorax. No left pleural effusion. Increased patchy left retrocardiac opacity. IMPRESSION: 1. Worsening near complete opacification of the right hemithorax with minimal residual aeration in the right upper lung and worsening diffuse marked right pleural thickening/effusion. 2. Worsening left mediastinal shift with increased patchy left retrocardiac opacity, probably atelectasis. Electronically Signed   By: Ilona Sorrel M.D.   On: 03/20/2018 21:19   Ir US Chest  Result Date: 03/21/2018 CLINICAL DATA:  47 year old male with mediastinal shift and SVC syndrome EXAM: CHEST ULTRASOUND COMPARISON:  CT 03/21/2018, 03/01/2018, 02/21/2018 FINDINGS: Limited ultrasound images were performed in order to locate potential fluid for aspiration/drainage. Ultrasound survey of the anterior and posterior chest demonstrates tumor tissue occupying space around the lungs. Drainage deferred at this time. IMPRESSION: Limited ultrasound images of the chest demonstrate no  localized fluid collection for drainage. Drainage deferred at this time. Electronically Signed   By: Corrie Mckusick D.O.   On: 03/21/2018 16:57    Medications: I have reviewed the patient's current medications.  Assessment/Plan:  1.  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  Staging PET scan 11/25/9516- hypermetabolic anterior right upper lobe mass, hypermetabolic soft tissue throughout the right pleural space, no evidence of distant metastatic disease  Cycle 1 Taxol/carboplatin 03/13/2018  CT chest 03/12/2018 increase in extensive tumor in the right chest with shift of the mediastinum to the left  CT chest 03/21/2018- large complex pleural effusion and pleural-based masses with near complete collapse of the right lung with shift of the mediastinum into the left chest, occlusion of the right mainstem bronchus, left lung airspace densities  Osimertinib started 03/21/2018  Chest radiation started 03/21/2018  2.  Respiratory failure secondary to #1  3.  CVA 09/07/2016 in setting of a PFO  4.  PFO closure January 2018  5.  Hypertension   Mr. Licausi appears slightly improved today.  He has less facial edema and appears to have less respiratory distress.  He will continue palliative chest radiation.  He is now at day 10 following cycle 1 Taxol/carboplatin chemotherapy.  The white count and platelets remain adequate.  He will continue daily osimertinib  Recommendations: 1.  Management of respiratory failure per critical care medicine 2.  Continue chest radiation 3.  Continue daily osimertinib 4.  Oncology will continue following him in the hospital and outpatient follow-up will be scheduled with Dr. Rogue Bussing.   LOS: 1 day   Betsy Coder, MD  03/22/2018, 1:37 PM

## 2018-03-23 ENCOUNTER — Ambulatory Visit: Payer: No Typology Code available for payment source

## 2018-03-23 ENCOUNTER — Inpatient Hospital Stay (HOSPITAL_COMMUNITY): Payer: No Typology Code available for payment source

## 2018-03-23 ENCOUNTER — Encounter: Payer: Self-pay | Admitting: Radiation Oncology

## 2018-03-23 ENCOUNTER — Ambulatory Visit
Admit: 2018-03-23 | Discharge: 2018-03-23 | Disposition: A | Discharge status: Home / Self Care | Payer: No Typology Code available for payment source | Attending: Radiation Oncology | Admitting: Radiation Oncology

## 2018-03-23 ENCOUNTER — Ambulatory Visit: Payer: Self-pay | Admitting: Internal Medicine

## 2018-03-23 DIAGNOSIS — Z9981 Dependence on supplemental oxygen: Secondary | ICD-10-CM

## 2018-03-23 DIAGNOSIS — Z8774 Personal history of (corrected) congenital malformations of heart and circulatory system: Secondary | ICD-10-CM

## 2018-03-23 DIAGNOSIS — C3490 Malignant neoplasm of unspecified part of unspecified bronchus or lung: Secondary | ICD-10-CM

## 2018-03-23 LAB — BLOOD GAS, ARTERIAL
ACID-BASE EXCESS: 3.2 mmol/L — AB (ref 0.0–2.0)
Acid-Base Excess: 1.6 mmol/L (ref 0.0–2.0)
Bicarbonate: 26.5 mmol/L (ref 20.0–28.0)
Bicarbonate: 26.9 mmol/L (ref 20.0–28.0)
Delivery systems: POSITIVE
Delivery systems: POSITIVE
Drawn by: 11249
Drawn by: 11249
FIO2: 60
FIO2: 40
INSPIRATORY PAP: 15
LHR: 15 {breaths}/min
MODE: POSITIVE
Mode: POSITIVE
O2 SAT: 92 %
O2 Saturation: 96.7 %
PATIENT TEMPERATURE: 98
PCO2 ART: 44.9 mmHg (ref 32.0–48.0)
PEEP/CPAP: 5 cmH2O
PEEP/CPAP: 5 cmH2O
PH ART: 7.387 (ref 7.350–7.450)
PH ART: 7.452 — AB (ref 7.350–7.450)
PRESSURE CONTROL: 15 cmH2O
Patient temperature: 98.7
RATE: 15 resp/min
pCO2 arterial: 39.1 mmHg (ref 32.0–48.0)
pO2, Arterial: 95.5 mmHg (ref 83.0–108.0)
pO2, Arterial: 66.1 mmHg — ABNORMAL LOW (ref 83.0–108.0)

## 2018-03-23 LAB — COMPREHENSIVE METABOLIC PANEL
ALBUMIN: 2.4 g/dL — AB (ref 3.5–5.0)
ALT: 32 U/L (ref 0–44)
ANION GAP: 6 (ref 5–15)
AST: 24 U/L (ref 15–41)
Alkaline Phosphatase: 40 U/L (ref 38–126)
BILIRUBIN TOTAL: 0.6 mg/dL (ref 0.3–1.2)
BUN: 26 mg/dL — ABNORMAL HIGH (ref 6–20)
CO2: 29 mmol/L (ref 22–32)
Calcium: 9.7 mg/dL (ref 8.9–10.3)
Chloride: 102 mmol/L (ref 98–111)
Creatinine, Ser: 1.05 mg/dL (ref 0.61–1.24)
GFR calc Af Amer: >60 mL/min (ref >60)
Glucose, Bld: 106 mg/dL — ABNORMAL HIGH (ref 70–99)
POTASSIUM: 3.8 mmol/L (ref 3.5–5.1)
Sodium: 137 mmol/L (ref 135–145)
TOTAL PROTEIN: 4.8 g/dL — AB (ref 6.5–8.1)

## 2018-03-23 LAB — GLUCOSE, CAPILLARY
Glucose-Capillary: 107 mg/dL — ABNORMAL HIGH (ref 70–99)
Glucose-Capillary: 98 mg/dL (ref 70–99)
Glucose-Capillary: 104 mg/dL — ABNORMAL HIGH (ref 70–99)

## 2018-03-23 LAB — CBC WITH DIFFERENTIAL/PLATELET
BASOS PCT: 0 %
Basophils Absolute: 0 10*3/uL (ref 0.0–0.1)
EOS PCT: 2 %
Eosinophils Absolute: 0.1 10*3/uL (ref 0.0–0.7)
HEMATOCRIT: 32.3 % — AB (ref 39.0–52.0)
Hemoglobin: 11 g/dL — ABNORMAL LOW (ref 13.0–17.0)
Lymphocytes Relative: 15 %
Lymphs Abs: 0.8 10*3/uL (ref 0.7–4.0)
MCH: 30.5 pg (ref 26.0–34.0)
MCHC: 34.1 g/dL (ref 30.0–36.0)
MCV: 89.5 fL (ref 78.0–100.0)
MONO ABS: 0.4 10*3/uL (ref 0.1–1.0)
MONOS PCT: 7 %
NEUTROS ABS: 4.4 10*3/uL (ref 1.7–7.7)
Neutrophils Relative %: 76 %
PLATELETS: 195 10*3/uL (ref 150–400)
RBC: 3.61 MIL/uL — ABNORMAL LOW (ref 4.22–5.81)
RDW: 12.7 % (ref 11.5–15.5)
WBC: 5.8 10*3/uL (ref 4.0–10.5)

## 2018-03-23 LAB — FOLATE: Folate: 13.9 ng/mL (ref >5.9)

## 2018-03-23 LAB — VITAMIN B12: VITAMIN B 12: 926 pg/mL — AB (ref 180–914)

## 2018-03-23 MED ORDER — DEXMEDETOMIDINE HCL IN NACL 200 MCG/50ML IV SOLN: 0.2000 ug/kg/h | INTRAVENOUS | Status: DC

## 2018-03-23 MED ORDER — VANCOMYCIN HCL IN DEXTROSE 1-5 GM/200ML-% IV SOLN: 1000.0000 mg | Freq: Two times a day (BID) | INTRAVENOUS | Status: DC

## 2018-03-23 MED ORDER — IPRATROPIUM-ALBUTEROL 0.5-2.5 (3) MG/3ML IN SOLN
3.0000 mL | Freq: Four times a day (QID) | RESPIRATORY_TRACT | Status: DC | PRN
Start: 2018-03-23 — End: 2018-03-27

## 2018-03-23 MED ORDER — VANCOMYCIN HCL 10 G IV SOLR: 1500.0000 mg | INTRAVENOUS | Status: DC

## 2018-03-23 MED ORDER — LORAZEPAM 2 MG/ML IJ SOLN
1.0000 mg | Freq: Once | INTRAMUSCULAR | Status: AC
  Administered 2018-03-23: 1 mg via INTRAVENOUS
  Filled 2018-03-23: qty 1

## 2018-03-23 MED ORDER — ALBUTEROL SULFATE (2.5 MG/3ML) 0.083% IN NEBU
2.5000 mg | INHALATION_SOLUTION | RESPIRATORY_TRACT | Status: DC | PRN
  Administered 2018-03-23: 2.5 mg via RESPIRATORY_TRACT
  Filled 2018-03-23: qty 3

## 2018-03-23 MED ORDER — MORPHINE SULFATE (CONCENTRATE) 10 MG/0.5ML PO SOLN: 10.0000 mg | ORAL | Status: DC | PRN

## 2018-03-23 NOTE — Progress Notes (Signed)
[facility] Therapy Treatment Record Phone [phone]   Radiation Therapy was administered to Ruben Eaton on: 03/23/2018  4:39 PM and was treatment # 5 out of a planned course of 5 treatments.  Radiation Treatment  1). Beam photons with 6-10 energy  2). Brachytherapy None  3). Stereotactic Radiosurgery None  4). Other Radiation None     Byrd Hesselbach, RT (T)

## 2018-03-23 NOTE — Progress Notes (Signed)
Patient does not want to wear CPAP due to increased mucous production. RT will continue to monitor.

## 2018-03-23 NOTE — Progress Notes (Signed)
eLink Physician-Brief Progress Note Patient Name: Ruben Eaton DOB: 1971/07/12 MRN: [id number]   Date of Service  03/23/2018  HPI/Events of Note  Agitated delirium of multifactorial etiology  eICU Interventions  Precedex 0.2 mics infusion for delirium        Frederik Pear 03/23/2018, 6:55 AM

## 2018-03-23 NOTE — Progress Notes (Addendum)
Pharmacy Antibiotic Note  Ruben Eaton is a 47 y.o. male admitted on 03/21/2018 with pneumonia.  Pharmacy has been consulted for vancomycin + piperacillin/tazobactam dosing.  Patient transferred from [facility] for urgent radiation. Pt has recently diagnosed NSCLC that is rapidly progressing. Pt s/p cycle 1 of Carboplatin/Paclitaxel on 03/13/18. Patient recently prescribed Tagrisso which was scheduled to begin on 03/21/18.  He presents with respiratory distress, broad spectrum antibiotics ordered for pneumonia.   Today, 03/23/18  Day #3 antibitoics  WBC WNL  Afebrile  Renal: Although SCr remains WNL, SCr slowly trending up over last 3 days. Significance unknown (no strict I/O so unable to evaluate UOP)  MRSA PCR neg  6/27 vancomycin level ordered = 7 mcg/mL - does not hold much value due to dosing change.  SCr up this am   Pharmacist asked [facility] 6/27 if appropriate to stop abx d/t malignancy may be more likely cause of respiratory issues over infectious etiology - [facility] wishes to continue abx  Plan:  Piperacillin/tazobactam 3.375 g IV q8h EI remains appropriate dose  For SCr trend upward, adjust vancomycin dose to 1500 mg IV q24h to target goal AUC 400-550  Consider stopping vancomycin with negative MRSA PCR, if wish to continue vancomycin then consider change zosyn to cefepime/metronidazole to reduce risk of AKI  Height: 5\' 6"  (167.6 cm) Weight: 170 lb 9.6 oz (77.4 kg) IBW/kg (Calculated) : 63.8  Temp (24hrs), Avg:97.6 F (36.4 C), Min:97.2 F (36.2 C), Max:98 F (36.7 C)  Recent Labs  Lab 03/19/18 1425 03/20/18 2045 03/21/18 0500 03/22/18 0526 03/23/18 0620  WBC 5.3 7.2 6.4 6.3 5.8  CREATININE 0.91 0.82 0.88 0.90 1.05  VANCOTROUGH  --   --   --  7*  --     Estimated Creatinine Clearance: 85.1 mL/min (by C-G formula based on SCr of 1.05 mg/dL).    No Known Allergies  Antimicrobials this admission: vancomycin 6/26 >>  Piperacillin/tazobactam 6/26 >>    Dose adjustments this admission: Dose adjustments this admission:  6/27 0526 vanco level per MD = 7 - level does not mean much (given 750mg  6/26 at 1600 of q8h regimen, given 1gm at 0924 6/27 of q12h regimen)  Microbiology results: 6/26 MRSA PCR: Negative  Thank you for allowing pharmacy to be a part of this patient's care.  Doreene Eland, PharmD, BCPS.   Pager: [phone] 03/23/2018 9:14 AM

## 2018-03-23 NOTE — Progress Notes (Signed)
IP PROGRESS NOTE  Subjective:   Ruben Eaton developed increased respiratory distress last night.  He became somnolent after receiving Ativan.  He feels better this morning.  His wife is at the bedside.  Objective: Vital signs in last 24 hours: Blood pressure 135/78, pulse (!) 114, temperature (!) 97.2 F (36.2 C), temperature source Axillary, resp. rate (!) 22, height '5\' 6"'$  (1.676 m), weight 170 lb 9.6 oz (77.4 kg), SpO2 94 %.  Intake/Output from previous day: 06/27 0701 - 06/28 0700 In: 1178.1 [P.O.:480; I.V.:230; IV Piggyback:468.1] Out: -   Physical Exam:  HEENT: No thrush Lungs: Bilateral expiratory wheeze/rhonchi Cardiac: Regular rhythm Abdomen: Nontender, no hepatomegaly Extremities: Bilateral lower leg/foot edema, mild improvement in the bilateral hand edema   Portacath/PICC-without erythema  Lab Results: Recent Labs    03/22/18 0526 03/23/18 0620  WBC 6.3 5.8  HGB 11.5* 11.0*  HCT 33.2* 32.3*  PLT 217 195    BMET Recent Labs    03/22/18 0526 03/23/18 0620  NA 134* 137  K 3.9 3.8  CL 100 102  CO2 29 29  GLUCOSE 105* 106*  BUN 24* 26*  CREATININE 0.90 1.05  CALCIUM 9.7 9.7     Studies/Results: Dg Chest Port 1 View  Result Date: 03/23/2018 CLINICAL DATA:  47 year old male with acute respiratory failure. History of VATS on 02/23/2018 and right lower lobe cancer. EXAM: PORTABLE CHEST 1 VIEW COMPARISON:  None. FINDINGS: Right-sided Port-A-Cath with tip over the mediastinum. There is a large pleural effusion with near complete consolidative changes of the right lung. There is only a small aerated portion of the right lung in the upper lobe. Left lung base densities may represent atelectasis or infiltrate. A small left pleural effusion may be present. There is no pneumothorax. There is silhouetting of the right cardiac border. No acute osseous pathology. IMPRESSION: Near complete opacification of the right hemithorax secondary to large right pleural effusion  and associated right lung compressive atelectasis or consolidation. Left lung base atelectasis versus infiltrate. Electronically Signed   By: Anner Crete M.D.   On: 03/23/2018 05:04   Ir US Chest  Result Date: 03/21/2018 CLINICAL DATA:  47 year old male with mediastinal shift and SVC syndrome EXAM: CHEST ULTRASOUND COMPARISON:  CT 03/21/2018, 03/01/2018, 02/21/2018 FINDINGS: Limited ultrasound images were performed in order to locate potential fluid for aspiration/drainage. Ultrasound survey of the anterior and posterior chest demonstrates tumor tissue occupying space around the lungs. Drainage deferred at this time. IMPRESSION: Limited ultrasound images of the chest demonstrate no localized fluid collection for drainage. Drainage deferred at this time. Electronically Signed   By: Corrie Mckusick D.O.   On: 03/21/2018 16:57    Medications: I have reviewed the patient's current medications.  Assessment/Plan:  1.  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  Staging PET scan 02/27/4033- hypermetabolic anterior right upper lobe mass, hypermetabolic soft tissue throughout the right pleural space, no evidence of distant metastatic disease  Cycle 1 Taxol/carboplatin 03/13/2018  CT chest 03/12/2018 increase in extensive tumor in the right chest with shift of the mediastinum to the left  CT chest 03/21/2018- large complex pleural effusion and pleural-based masses with near complete collapse of the right lung with shift of the mediastinum into the left chest, occlusion of the right mainstem bronchus, left lung airspace densities  Osimertinib started 03/21/2018  Chest radiation started 03/21/2018  2.  Respiratory failure secondary to #1  3.  CVA 09/07/2016 in setting of a PFO  4.  PFO closure January 2018  5.  Hypertension   Ruben Eaton appears stable this morning.  He developed increased oxygen  requirement last night.  He is now at day 11 following cycle 1 Taxol/carboplatin.  He is day 3 osimertinib.  He will complete day 3 chest radiation today.  The white count and platelets remain adequate.  Recommendations: 1.  Management of respiratory failure per critical care medicine, consider bronchodilator therapy 2.  Continue chest radiation 3.  Continue daily osimertinib 4.  Please call oncology as needed over the weekend.  I will see him 03/26/2018   LOS: 2 days   Betsy Coder, MD   03/23/2018, 7:35 AM

## 2018-03-23 NOTE — Progress Notes (Addendum)
Pt has had extreme anxiety/ agitation related to increased SOB. Crackles auscultated bilateral lower lobes. Pt currently on HFNC @ 15 L.  Sat 92-94% MD notified. RN will continue to monitor.

## 2018-03-23 NOTE — Progress Notes (Signed)
eLink Physician-Brief Progress Note Patient Name: Ruben Eaton DOB: [birth year] MRN: [id number]   Date of Service  03/23/2018  HPI/Events of Note  Drowsy with oxygen saturation in the 88 % range following 1 mg of Ativan orally followed by another 1 mg iv ordered by [facility]. Pt requires CPAP for sleep but has not been compliant. Earlier agitation may have been due to hypoxia.  eICU Interventions  Bipap. ABG. Avoid benzos, use Precedex for sedation/ anxiolysis if needed.        Kerry Kass Ogan 03/23/2018, 2:26 AM

## 2018-03-23 NOTE — Progress Notes (Addendum)
@  0400 Notified E-link regarding Pt's AMS. Pt only responsive to pain/ vigorous stimulation   @ 0630- Pt alert, follow commands, still confused.               -Per MD order- tried to place Pt back  on HFNC @ 15 L. However, Pt was unable to tolerate. Pt had increased WOB, SOB, and RR-35-40. Pt was placed back on Bipap, and 02 Sat current ly 93%.

## 2018-03-23 NOTE — Progress Notes (Signed)
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  He received chemo and has since developed dyspnea which is worse on laying flat and swelling of arms and legs. In the ER chest x-ray was showing near opacification of the right hemithorax and worsening left mediastinal shift. The catheter was no longer draining. He was transferred to [facility] for CT surgery consult. CT chest revealed a Large complex pleural effusion and pleural based masses with near complete collapse of the right lung and associated mass effect and shift of the mediastinum into the left hemithorax. There is occlusion of the right mainstem bronchus secondary to mass effect and extrinsic compression.  Left lower lobe and lingula airspace densities most consistent with pneumonia.  Dr Roxy Manns evaluated him and decided he was a poor candidate for re-do Vats. It was decided to transfer him to [facility] for urgent radiation.  Subjective: He had a poor night. He became anxious and was given Morphine x 2 doses (10 mg each), Ativan 1mg  oral, 1 mg IV. BP dropped and he ended up on a BiPAP overnight. He was transitioned back to NS this AM and if feeling much better now.   Assessment & Plan:   Principal Problem:  Primary cancer of right lower lobe of lung - non small cell- 02/23/18- no distant mets SVC syndrome  Acute respiratory failure with hypoxia- Right lung collapse and midline shift due to large right malignant pleural effusion - oncology managing- started daily radiation on 6/26 - peripheral edema still presists - cont Osimertinib - hold off on  diuretics - cont O2 as needed  Active Problems: ? LLL pneumonia - on Vanc and Zosyn since 6/26- stop Vanc today- give total 5 days of Zosyn    History of stroke/ PFO - s/p PFO coslure on 12/17     OSA on CPAP   HTN - cont Metoprolol  Anemia -  Iron panel shows normal stores   DVT prophylaxis: Lovenox Code Status: full code Family Communication: wife Disposition Plan: cont radiation - follow in SDU Consultants:   Oncology- medical and radiation  CT surgery   PCCM  IR Procedures:    radiation treatment Antimicrobials:  Anti-infectives (From admission, onward)   Start     Dose/Rate Route Frequency Ordered Stop   03/23/18 1600  vancomycin (VANCOCIN) 1,500 mg in sodium chloride 0.9 % 500 mL IVPB     1,500 mg 250 mL/hr over 120 Minutes Intravenous Every 24 hours 03/23/18 0920     03/23/18 1000  vancomycin (VANCOCIN) IVPB 1000 mg/200 mL premix  Status:  Discontinued     1,000 mg 200 mL/hr over 60 Minutes Intravenous Every 12 hours 03/23/18 0916 03/23/18 0920   03/22/18 1000  vancomycin (VANCOCIN) IVPB 1000 mg/200 mL premix  Status:  Discontinued     1,000 mg 200 mL/hr over 60 Minutes Intravenous Every 12 hours 03/22/18 0746 03/23/18 0916   03/22/18 0600  vancomycin (VANCOCIN) IVPB 1000 mg/200 mL premix  Status:  Discontinued     1,000 mg 200 mL/hr over 60 Minutes Intravenous Every 12 hours 03/21/18 1942 03/22/18 0746   03/21/18 1330  piperacillin-tazobactam (ZOSYN) IVPB  3.375 g     3.375 g 12.5 mL/hr over 240 Minutes Intravenous Every 8 hours 03/21/18 0509     03/21/18 1330  vancomycin (VANCOCIN) IVPB 750 mg/150 ml premix  Status:  Discontinued     750 mg 150 mL/hr over 60 Minutes Intravenous Every 8 hours 03/21/18 0509 03/21/18 1931   03/21/18 0515  piperacillin-tazobactam (ZOSYN) IVPB 3.375 g     3.375 g 100 mL/hr over 30 Minutes Intravenous  Once 03/21/18 0503 03/21/18 0635   03/21/18 0515  vancomycin (VANCOCIN) 1,500 mg in sodium chloride 0.9 % 500 mL IVPB      1,500 mg 250 mL/hr over 120 Minutes Intravenous  Once 03/21/18 0503 03/21/18 0732       Objective: Vitals:   03/23/18 0500 03/23/18 0600 03/23/18 0735 03/23/18 0747  BP: 106/69 135/78    Pulse: (!) 102 (!) 114    Resp: 15 (!) 22    Temp:   97.6 F (36.4 C)   TempSrc:   Oral   SpO2: 95% 94%  92%  Weight:      Height:        Intake/Output Summary (Last 24 hours) at 03/23/2018 0950 Last data filed at 03/22/2018 1700 Gross per 24 hour  Intake 652.92 ml  Output -  Net 652.92 ml   Filed Weights   03/21/18 0120  Weight: 77.4 kg (170 lb 9.6 oz)    Examination: General exam: Appears comfortable  HEENT: PERRLA, oral mucosa moist, no sclera icterus or thrush Respiratory system: Clear to auscultation. Respiratory effort normal. Pulse ox 94% on 4 L Cardiovascular system: S1 & S2 heard, RRR.   Gastrointestinal system: Abdomen soft, non-tender, nondistended. Normal bowel sound. No organomegaly Central nervous system: Alert and oriented. No focal neurological deficits. Extremities: No cyanosis, clubbing - swelling of arms and legs noted Skin: No rashes or ulcers Psychiatry:  Mood & affect appropriate.     Data Reviewed: I have personally reviewed following labs and imaging studies  CBC: Recent Labs  Lab 03/19/18 1425 03/20/18 2045 03/21/18 0500 03/22/18 0526 03/23/18 0620  WBC 5.3 7.2 6.4 6.3 5.8  NEUTROABS 3.5  --   --   --  4.4  HGB 11.5* 12.0* 11.2* 11.5* 11.0*  HCT 33.2* 34.2* 33.1* 33.2* 32.3*  MCV 90.4 89.6 88.7 88.8 89.5  PLT 182 206 198 217 732   Basic Metabolic Panel: Recent Labs  Lab 03/19/18 1425 03/20/18 2045 03/21/18 0500 03/22/18 0526 03/23/18 0620  NA 131* 129* 131* 134* 137  K 4.0 3.7 3.7 3.9 3.8  CL 99* 95* 96* 100 102  CO2 23 24 26 29 29   GLUCOSE 119* 127* 111* 105* 106*  BUN 21* 23* 18 24* 26*  CREATININE 0.91 0.82 0.88 0.90 1.05  CALCIUM 9.3 9.3 9.6 9.7 9.7   GFR: Estimated Creatinine Clearance: 85.1 mL/min (by C-G formula based on SCr  of 1.05 mg/dL). Liver Function Tests: Recent Labs  Lab 03/19/18 1425 03/20/18 2100 03/23/18 0620  AST 35 35 24  ALT 41 40 32  ALKPHOS 45 44 40  BILITOT 0.7 0.5 0.6  PROT 5.3* 5.3* 4.8*  ALBUMIN 2.7* 2.7* 2.4*   No results for input(s): LIPASE, AMYLASE in the last 168 hours. No results for input(s): AMMONIA in the last 168 hours. Coagulation Profile: Recent Labs  Lab 03/21/18 0520  INR 1.16   Cardiac Enzymes: Recent Labs  Lab 03/20/18 2045  TROPONINI <0.03   BNP (last 3 results) No results for input(s): PROBNP in the  last 8760 hours. HbA1C: No results for input(s): HGBA1C in the last 72 hours. CBG: Recent Labs  Lab 03/21/18 2303 03/22/18 1247 03/22/18 1811 03/22/18 2310 03/23/18 0603  GLUCAP 113* 106* 110* 115* 107*   Lipid Profile: No results for input(s): CHOL, HDL, LDLCALC, TRIG, CHOLHDL, LDLDIRECT in the last 72 hours. Thyroid Function Tests: No results for input(s): TSH, T4TOTAL, FREET4, T3FREE, THYROIDAB in the last 72 hours. Anemia Panel: Recent Labs    03/22/18 1115  FERRITIN 224  TIBC 233*  IRON 28*  RETICCTPCT 1.2   Urine analysis:    Component Value Date/Time   COLORURINE YELLOW (A) 03/20/2018 2256   APPEARANCEUR CLEAR (A) 03/20/2018 2256   LABSPEC 1.021 03/20/2018 2256   PHURINE 6.0 03/20/2018 2256   GLUCOSEU NEGATIVE 03/20/2018 2256   HGBUR NEGATIVE 03/20/2018 2256   BILIRUBINUR NEGATIVE 03/20/2018 2256   KETONESUR 5 (A) 03/20/2018 2256   PROTEINUR NEGATIVE 03/20/2018 2256   NITRITE NEGATIVE 03/20/2018 2256   LEUKOCYTESUR NEGATIVE 03/20/2018 2256   Sepsis Labs: @LABRCNTIP (procalcitonin:4,lacticidven:4) ) Recent Results (from the past 240 hour(s))  Surgical pcr screen     Status: None   Collection Time: 03/21/18 12:11 PM  Result Value Ref Range Status   MRSA, PCR NEGATIVE NEGATIVE Final   Staphylococcus aureus NEGATIVE NEGATIVE Final    Comment: (NOTE) The Xpert SA Assay (FDA approved for NASAL specimens in patients 67 years  of age and older), is one component of a comprehensive surveillance program. It is not intended to diagnose infection nor to guide or monitor treatment. Performed at [facility], [place] [address], [place], [place] [address]          Radiology Studies: Dg Chest [place] 1 View  Result Date: 03/23/2018 CLINICAL DATA:  47 year old male with acute respiratory failure. History of VATS on 02/23/2018 and right lower lobe cancer. EXAM: PORTABLE CHEST 1 VIEW COMPARISON:  None. FINDINGS: Right-sided Port-A-Cath with tip over the mediastinum. There is a large pleural effusion with near complete consolidative changes of the right lung. There is only a small aerated portion of the right lung in the upper lobe. Left lung base densities may represent atelectasis or infiltrate. A small left pleural effusion may be present. There is no pneumothorax. There is silhouetting of the right cardiac border. No acute osseous pathology. IMPRESSION: Near complete opacification of the right hemithorax secondary to large right pleural effusion and associated right lung compressive atelectasis or consolidation. Left lung base atelectasis versus infiltrate. Electronically Signed   By: Anner Crete M.D.   On: 03/23/2018 05:04   Ir US Chest  Result Date: 03/21/2018 CLINICAL DATA:  47 year old male with mediastinal shift and SVC syndrome EXAM: CHEST ULTRASOUND COMPARISON:  CT 03/21/2018, 03/01/2018, 02/21/2018 FINDINGS: Limited ultrasound images were performed in order to locate potential fluid for aspiration/drainage. Ultrasound survey of the anterior and posterior chest demonstrates tumor tissue occupying space around the lungs. Drainage deferred at this time. IMPRESSION: Limited ultrasound images of the chest demonstrate no localized fluid collection for drainage. Drainage deferred at this time. Electronically Signed   By: Corrie Mckusick D.O.   On: 03/21/2018 16:57      Scheduled Meds: . aspirin EC  81 mg Oral Daily    . Chlorhexidine Gluconate Cloth  6 each Topical Daily  . dextromethorphan-guaiFENesin  1 tablet Oral BID  . enoxaparin (LOVENOX) injection  40 mg Subcutaneous Daily  . mouth rinse  15 mL Mouth Rinse BID  . metoprolol tartrate  25 mg Oral BID  .  morphine  15 mg Oral Q12H  . multivitamin with minerals  1 tablet Oral Daily  . osimertinib mesylate  80 mg Oral Daily  . polyethylene glycol  17 g Oral Daily  . senna-docusate  1 tablet Oral Daily   Continuous Infusions: . sodium chloride 10 mL/hr at 03/22/18 1100  . dexmedetomidine (PRECEDEX) IV infusion    . piperacillin-tazobactam (ZOSYN)  IV Stopped (03/23/18 0100)  . vancomycin       LOS: 2 days    Time spent in minutes: 35    Debbe Odea, MD [facility] Pager: [email or web address] Password [facility] 03/23/2018, 9:50 AM

## 2018-03-24 LAB — BASIC METABOLIC PANEL
ANION GAP: 6 (ref 5–15)
BUN: 22 mg/dL — AB (ref 6–20)
CALCIUM: 9.9 mg/dL (ref 8.9–10.3)
CO2: 29 mmol/L (ref 22–32)
Chloride: 105 mmol/L (ref 98–111)
Creatinine, Ser: 1.04 mg/dL (ref 0.61–1.24)
GFR calc Af Amer: >60 mL/min (ref >60)
GLUCOSE: 117 mg/dL — AB (ref 70–99)
Potassium: 3.8 mmol/L (ref 3.5–5.1)
Sodium: 140 mmol/L (ref 135–145)

## 2018-03-24 LAB — GLUCOSE, CAPILLARY
GLUCOSE-CAPILLARY: 102 mg/dL — AB (ref 70–99)
GLUCOSE-CAPILLARY: 103 mg/dL — AB (ref 70–99)
GLUCOSE-CAPILLARY: 96 mg/dL (ref 70–99)
Glucose-Capillary: 100 mg/dL — ABNORMAL HIGH (ref 70–99)
Glucose-Capillary: 93 mg/dL (ref 70–99)

## 2018-03-24 MED ORDER — PRO-STAT SUGAR FREE PO LIQD
30.0000 mL | Freq: Two times a day (BID) | ORAL | Status: DC
  Administered 2018-03-24 – 2018-03-27 (×7): 30 mL via ORAL
  Filled 2018-03-24 (×7): qty 30

## 2018-03-24 MED ORDER — FUROSEMIDE 10 MG/ML IJ SOLN
20.0000 mg | Freq: Once | INTRAMUSCULAR | Status: AC
  Administered 2018-03-24: 20 mg via INTRAVENOUS
  Filled 2018-03-24: qty 2

## 2018-03-24 MED ORDER — LORAZEPAM 2 MG/ML IJ SOLN
1.0000 mg | Freq: Once | INTRAMUSCULAR | Status: AC
  Administered 2018-03-24: 1 mg via INTRAVENOUS
  Filled 2018-03-24: qty 1

## 2018-03-24 NOTE — Progress Notes (Signed)
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  He received chemo and has since developed dyspnea which is worse on laying flat and swelling of arms and legs. In the ER chest x-ray was showing near opacification of the right hemithorax and worsening left mediastinal shift. The catheter was no longer draining. He was transferred to [facility] for CT surgery consult. CT chest revealed a Large complex pleural effusion and pleural based masses with near complete collapse of the right lung and associated mass effect and shift of the mediastinum into the left hemithorax. There is occlusion of the right mainstem bronchus secondary to mass effect and extrinsic compression.  Left lower lobe and lingula airspace densities most consistent with pneumonia.  Dr Roxy Manns evaluated him and decided he was a poor candidate for re-do Vats. It was decided to transfer him to [facility] for urgent radiation.  Subjective:  He has no complaints today and is doing well overall. Asking about a small dose of Lasix to see if it will help. We have discussed it and I will give him 20 mg IV.   Assessment & Plan:   Principal Problem:  Primary cancer of right lower lobe of lung - non small cell- 02/23/18- no distant mets SVC syndrome  Acute respiratory failure with hypoxia- Right lung collapse and midline shift due to large right malignant pleural effusion - oncology managing- started daily radiation on 6/26 - peripheral edema still presists - cont Osimertinib - one time dose of IV Lasix 20 mg - cont O2 as needed  Active Problems: ? LLL  pneumonia - on Vanc and Zosyn since 6/26-MRSA PCR neg- stopped Vanc 6/28-  give total 5 days of Zosyn    History of stroke/ PFO - s/p PFO coslure on 12/17     OSA on CPAP   HTN - cont Metoprolol  Anemia -  Iron panel shows normal stores   DVT prophylaxis: Lovenox Code Status: full code Family Communication: wife Disposition Plan: cont radiation - follow in SDU Consultants:   Oncology- medical and radiation  CT surgery   PCCM  IR Procedures:    radiation treatment Antimicrobials:  Anti-infectives (From admission, onward)   Start     Dose/Rate Route Frequency Ordered Stop   03/23/18 1600  vancomycin (VANCOCIN) 1,500 mg in sodium chloride 0.9 % 500 mL IVPB  Status:  Discontinued     1,500 mg 250 mL/hr over 120 Minutes Intravenous Every 24 hours 03/23/18 0920 03/23/18 1009   03/23/18 1000  vancomycin (VANCOCIN) IVPB 1000 mg/200 mL premix  Status:  Discontinued     1,000 mg 200 mL/hr over 60 Minutes Intravenous Every 12 hours 03/23/18 0916 03/23/18 0920   03/22/18 1000  vancomycin (VANCOCIN) IVPB 1000 mg/200 mL premix  Status:  Discontinued     1,000 mg 200 mL/hr over 60 Minutes Intravenous Every 12 hours 03/22/18 0746 03/23/18 0916   03/22/18 0600  vancomycin (VANCOCIN) IVPB 1000 mg/200 mL premix  Status:  Discontinued     1,000 mg 200 mL/hr over 60 Minutes Intravenous Every 12 hours 03/21/18 1942 03/22/18 0746   03/21/18 1330  piperacillin-tazobactam (ZOSYN) IVPB 3.375  g     3.375 g 12.5 mL/hr over 240 Minutes Intravenous Every 8 hours 03/21/18 0509 03/25/18 2359   03/21/18 1330  vancomycin (VANCOCIN) IVPB 750 mg/150 ml premix  Status:  Discontinued     750 mg 150 mL/hr over 60 Minutes Intravenous Every 8 hours 03/21/18 0509 03/21/18 1931   03/21/18 0515  piperacillin-tazobactam (ZOSYN) IVPB 3.375 g     3.375 g 100 mL/hr over 30 Minutes Intravenous  Once 03/21/18 0503 03/21/18 0635   03/21/18 0515  vancomycin (VANCOCIN) 1,500 mg in sodium chloride 0.9 % 500 mL IVPB      1,500 mg 250 mL/hr over 120 Minutes Intravenous  Once 03/21/18 0503 03/21/18 0732       Objective: Vitals:   03/24/18 0600 03/24/18 0616 03/24/18 0641 03/24/18 0830  BP: 129/71     Pulse: 99  (!) 102   Resp: 13  17   Temp:  98.3 F (36.8 C)  97.8 F (36.6 C)  TempSrc:  Oral  Oral  SpO2: (!) 88%  94%   Weight:      Height:        Intake/Output Summary (Last 24 hours) at 03/24/2018 1002 Last data filed at 03/24/2018 0100 Gross per 24 hour  Intake 527.92 ml  Output -  Net 527.92 ml   Filed Weights   03/21/18 0120  Weight: 77.4 kg (170 lb 9.6 oz)    Examination: General exam: Appears comfortable  HEENT: PERRLA, oral mucosa moist, no sclera icterus or thrush Respiratory system: Clear to auscultation. Respiratory effort normal. Pulse ox 94% on 6 L Cardiovascular system: S1 & S2 heard, RRR.   Gastrointestinal system: Abdomen soft, non-tender, nondistended. Normal bowel sound. No organomegaly Central nervous system: Alert and oriented. No focal neurological deficits. Extremities: No cyanosis, clubbing - swelling of arms and legs noted Skin: No rashes or ulcers Psychiatry:  Mood & affect appropriate.     Data Reviewed: I have personally reviewed following labs and imaging studies  CBC: Recent Labs  Lab 03/19/18 1425 03/20/18 2045 03/21/18 0500 03/22/18 0526 03/23/18 0620  WBC 5.3 7.2 6.4 6.3 5.8  NEUTROABS 3.5  --   --   --  4.4  HGB 11.5* 12.0* 11.2* 11.5* 11.0*  HCT 33.2* 34.2* 33.1* 33.2* 32.3*  MCV 90.4 89.6 88.7 88.8 89.5  PLT 182 206 198 217 295   Basic Metabolic Panel: Recent Labs  Lab 03/19/18 1425 03/20/18 2045 03/21/18 0500 03/22/18 0526 03/23/18 0620  NA 131* 129* 131* 134* 137  K 4.0 3.7 3.7 3.9 3.8  CL 99* 95* 96* 100 102  CO2 23 24 26 29 29   GLUCOSE 119* 127* 111* 105* 106*  BUN 21* 23* 18 24* 26*  CREATININE 0.91 0.82 0.88 0.90 1.05  CALCIUM 9.3 9.3 9.6 9.7 9.7   GFR: Estimated Creatinine Clearance: 85.1 mL/min (by C-G formula  based on SCr of 1.05 mg/dL). Liver Function Tests: Recent Labs  Lab 03/19/18 1425 03/20/18 2100 03/23/18 0620  AST 35 35 24  ALT 41 40 32  ALKPHOS 45 44 40  BILITOT 0.7 0.5 0.6  PROT 5.3* 5.3* 4.8*  ALBUMIN 2.7* 2.7* 2.4*   No results for input(s): LIPASE, AMYLASE in the last 168 hours. No results for input(s): AMMONIA in the last 168 hours. Coagulation Profile: Recent Labs  Lab 03/21/18 0520  INR 1.16   Cardiac Enzymes: Recent Labs  Lab 03/20/18 2045  TROPONINI <0.03   BNP (last 3 results) No results for input(s): PROBNP in  the last 8760 hours. HbA1C: No results for input(s): HGBA1C in the last 72 hours. CBG: Recent Labs  Lab 03/23/18 0603 03/23/18 1114 03/23/18 1748 03/24/18 0145 03/24/18 0614  GLUCAP 107* 98 104* 102* 103*   Lipid Profile: No results for input(s): CHOL, HDL, LDLCALC, TRIG, CHOLHDL, LDLDIRECT in the last 72 hours. Thyroid Function Tests: No results for input(s): TSH, T4TOTAL, FREET4, T3FREE, THYROIDAB in the last 72 hours. Anemia Panel: Recent Labs    03/22/18 1115 03/23/18 1010  VITAMINB12  --  926*  FOLATE  --  13.9  FERRITIN 224  --   TIBC 233*  --   IRON 28*  --   RETICCTPCT 1.2  --    Urine analysis:    Component Value Date/Time   COLORURINE YELLOW (A) 03/20/2018 2256   APPEARANCEUR CLEAR (A) 03/20/2018 2256   LABSPEC 1.021 03/20/2018 2256   PHURINE 6.0 03/20/2018 2256   GLUCOSEU NEGATIVE 03/20/2018 2256   HGBUR NEGATIVE 03/20/2018 2256   BILIRUBINUR NEGATIVE 03/20/2018 2256   KETONESUR 5 (A) 03/20/2018 2256   PROTEINUR NEGATIVE 03/20/2018 2256   NITRITE NEGATIVE 03/20/2018 2256   LEUKOCYTESUR NEGATIVE 03/20/2018 2256   Sepsis Labs: @LABRCNTIP (procalcitonin:4,lacticidven:4) ) Recent Results (from the past 240 hour(s))  Surgical pcr screen     Status: None   Collection Time: 03/21/18 12:11 PM  Result Value Ref Range Status   MRSA, PCR NEGATIVE NEGATIVE Final   Staphylococcus aureus NEGATIVE NEGATIVE Final     Comment: (NOTE) The Xpert SA Assay (FDA approved for NASAL specimens in patients 108 years of age and older), is one component of a comprehensive surveillance program. It is not intended to diagnose infection nor to guide or monitor treatment. Performed at [facility], [place] [address], [place], [place] [address]          Radiology Studies: Dg Chest [place] 1 View  Result Date: 03/23/2018 CLINICAL DATA:  47 year old male with acute respiratory failure. History of VATS on 02/23/2018 and right lower lobe cancer. EXAM: PORTABLE CHEST 1 VIEW COMPARISON:  None. FINDINGS: Right-sided Port-A-Cath with tip over the mediastinum. There is a large pleural effusion with near complete consolidative changes of the right lung. There is only a small aerated portion of the right lung in the upper lobe. Left lung base densities may represent atelectasis or infiltrate. A small left pleural effusion may be present. There is no pneumothorax. There is silhouetting of the right cardiac border. No acute osseous pathology. IMPRESSION: Near complete opacification of the right hemithorax secondary to large right pleural effusion and associated right lung compressive atelectasis or consolidation. Left lung base atelectasis versus infiltrate. Electronically Signed   By: Anner Crete M.D.   On: 03/23/2018 05:04      Scheduled Meds: . aspirin EC  81 mg Oral Daily  . Chlorhexidine Gluconate Cloth  6 each Topical Daily  . dextromethorphan-guaiFENesin  1 tablet Oral BID  . enoxaparin (LOVENOX) injection  40 mg Subcutaneous Daily  . feeding supplement (PRO-STAT SUGAR FREE 64)  30 mL Oral BID  . furosemide  20 mg Intravenous Once  . mouth rinse  15 mL Mouth Rinse BID  . metoprolol tartrate  25 mg Oral BID  . multivitamin with minerals  1 tablet Oral Daily  . osimertinib mesylate  80 mg Oral Daily  . polyethylene glycol  17 g Oral Daily  . senna-docusate  1 tablet Oral Daily   Continuous Infusions: . sodium  chloride Stopped (03/23/18 1859)  . piperacillin-tazobactam (ZOSYN)  IV Stopped (03/24/18 0728)     LOS: 3 days    Time spent in minutes: 35    Debbe Odea, MD [facility] Pager: [email or web address] Password [email or web address] 03/24/2018, 10:02 AM

## 2018-03-25 LAB — GLUCOSE, CAPILLARY
GLUCOSE-CAPILLARY: 121 mg/dL — AB (ref 70–99)
Glucose-Capillary: 97 mg/dL (ref 70–99)
Glucose-Capillary: 101 mg/dL — ABNORMAL HIGH (ref 70–99)

## 2018-03-25 LAB — BASIC METABOLIC PANEL
ANION GAP: 7 (ref 5–15)
BUN: 25 mg/dL — ABNORMAL HIGH (ref 6–20)
CALCIUM: 9.9 mg/dL (ref 8.9–10.3)
CHLORIDE: 103 mmol/L (ref 98–111)
CO2: 31 mmol/L (ref 22–32)
Creatinine, Ser: 1.18 mg/dL (ref 0.61–1.24)
GFR calc non Af Amer: >60 mL/min (ref >60)
GLUCOSE: 108 mg/dL — AB (ref 70–99)
POTASSIUM: 3.5 mmol/L (ref 3.5–5.1)
Sodium: 141 mmol/L (ref 135–145)

## 2018-03-25 MED ORDER — MORPHINE SULFATE (CONCENTRATE) 10 MG/0.5ML PO SOLN: 10.0000 mg | ORAL | Status: DC | PRN

## 2018-03-25 MED ORDER — DESONIDE 0.05 % EX OINT
TOPICAL_OINTMENT | Freq: Two times a day (BID) | CUTANEOUS | Status: DC
  Administered 2018-03-25: 09:00:00 via TOPICAL
  Administered 2018-03-25: 1 via TOPICAL
  Administered 2018-03-26 – 2018-03-27 (×3): via TOPICAL
  Filled 2018-03-25 (×2): qty 15

## 2018-03-25 MED ORDER — LORAZEPAM 2 MG/ML IJ SOLN
1.0000 mg | Freq: Once | INTRAMUSCULAR | Status: AC
  Administered 2018-03-25: 1 mg via INTRAVENOUS
  Filled 2018-03-25: qty 1

## 2018-03-25 MED ORDER — OXYCODONE HCL 5 MG PO TABS
5.0000 mg | ORAL_TABLET | Freq: Four times a day (QID) | ORAL | Status: DC | PRN
  Administered 2018-03-25 – 2018-03-27 (×4): 5 mg via ORAL
  Filled 2018-03-25 (×4): qty 1

## 2018-03-25 MED ORDER — SALINE SPRAY 0.65 % NA SOLN
1.0000 | NASAL | Status: DC | PRN
  Administered 2018-03-25: 1 via NASAL
  Filled 2018-03-25: qty 44

## 2018-03-25 MED ORDER — FUROSEMIDE 10 MG/ML IJ SOLN
20.0000 mg | Freq: Once | INTRAMUSCULAR | Status: AC
  Administered 2018-03-25: 20 mg via INTRAVENOUS
  Filled 2018-03-25: qty 2

## 2018-03-25 NOTE — Progress Notes (Signed)
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  He received chemo and has since developed dyspnea which is worse on laying flat and swelling of arms and legs. In the ER chest x-ray was showing near opacification of the right hemithorax and worsening left mediastinal shift. The catheter was no longer draining. He was transferred to [facility] for CT surgery consult. CT chest revealed a Large complex pleural effusion and pleural based masses with near complete collapse of the right lung and associated mass effect and shift of the mediastinum into the left hemithorax. There is occlusion of the right mainstem bronchus secondary to mass effect and extrinsic compression.  Left lower lobe and lingula airspace densities most consistent with pneumonia.  Dr Roxy Manns evaluated him and decided he was a poor candidate for re-do Vats. It was decided to transfer him to [facility] for urgent radiation.  Subjective: Lasix has helped bring down some of the swelling. He is asking for another dose today as it worked so well. Also having some pain in his right side for which he would like an Oxycodone. No constipation. Breathing is stable.   Assessment & Plan:   Principal Problem:  Primary cancer of right lower lobe of lung - non small cell- 02/23/18- no distant mets SVC syndrome  Acute respiratory failure with hypoxia- Right lung collapse and midline shift due to large right malignant pleural effusion - oncology managing- started daily radiation on 6/26  - cont Osimertinib - one time dose of IV Lasix 20 mg again today- f/u on  peripheral edema - cont O2 as needed and wean as able  Active Problems: ? LLL pneumonia - on Vanc and Zosyn since 6/26-MRSA PCR neg- stopped Vanc 6/28-  give total 5 days of Zosyn    History of stroke/ PFO - s/p PFO coslure on 12/17     OSA on CPAP   HTN - cont Metoprolol  Anemia -  Iron panel shows normal stores   DVT prophylaxis: Lovenox Code Status: full code Family Communication: wife Disposition Plan: cont radiation - follow in SDU until O2 weaned down to at least 4 L Consultants:   Oncology- medical and radiation  CT surgery   PCCM  IR Procedures:    radiation treatment Antimicrobials:  Anti-infectives (From admission, onward)   Start     Dose/Rate Route Frequency Ordered Stop   03/23/18 1600  vancomycin (VANCOCIN) 1,500 mg in sodium chloride 0.9 % 500 mL IVPB  Status:  Discontinued     1,500 mg 250 mL/hr over 120 Minutes Intravenous Every 24 hours 03/23/18 0920 03/23/18 1009   03/23/18 1000  vancomycin (VANCOCIN) IVPB 1000 mg/200 mL premix  Status:  Discontinued     1,000 mg 200 mL/hr over 60 Minutes Intravenous Every 12 hours 03/23/18 0916 03/23/18 0920   03/22/18 1000  vancomycin (VANCOCIN) IVPB 1000 mg/200 mL premix  Status:  Discontinued     1,000 mg 200 mL/hr over 60 Minutes Intravenous Every 12 hours 03/22/18 0746 03/23/18 0916   03/22/18 0600  vancomycin (VANCOCIN) IVPB 1000 mg/200 mL premix  Status:  Discontinued     1,000 mg 200 mL/hr  over 60 Minutes Intravenous Every 12 hours 03/21/18 1942 03/22/18 0746   03/21/18 1330  piperacillin-tazobactam (ZOSYN) IVPB 3.375 g     3.375 g 12.5 mL/hr over 240 Minutes Intravenous Every 8 hours 03/21/18 0509 03/25/18 2359   03/21/18 1330  vancomycin (VANCOCIN) IVPB 750 mg/150 ml premix  Status:  Discontinued     750 mg 150 mL/hr over 60 Minutes Intravenous Every 8 hours 03/21/18 0509 03/21/18 1931   03/21/18 0515  piperacillin-tazobactam (ZOSYN) IVPB 3.375 g     3.375 g 100 mL/hr over 30 Minutes Intravenous   Once 03/21/18 0503 03/21/18 0635   03/21/18 0515  vancomycin (VANCOCIN) 1,500 mg in sodium chloride 0.9 % 500 mL IVPB     1,500 mg 250 mL/hr over 120 Minutes Intravenous  Once 03/21/18 0503 03/21/18 0732       Objective: Vitals:   03/25/18 0500 03/25/18 0600 03/25/18 0800 03/25/18 0900  BP: 126/76 138/87  123/74  Pulse: 91 92  93  Resp: 17 19    Temp:   97.6 F (36.4 C)   TempSrc:   Oral   SpO2: 95% 96%    Weight:      Height:        Intake/Output Summary (Last 24 hours) at 03/25/2018 1117 Last data filed at 03/25/2018 0605 Gross per 24 hour  Intake -  Output 2 ml  Net -2 ml   Filed Weights   03/21/18 0120  Weight: 77.4 kg (170 lb 9.6 oz)    Examination: General exam: Appears comfortable  HEENT: PERRLA, oral mucosa moist, no sclera icterus or thrush Respiratory system: Clear to auscultation. Respiratory effort normal. Pulse ox 99% on 6 L Cardiovascular system: S1 & S2 heard, RRR.   Gastrointestinal system: Abdomen soft, non-tender, nondistended. Normal bowel sound. No organomegaly Central nervous system: Alert and oriented. No focal neurological deficits. Extremities: No cyanosis, clubbing - swelling of arms and legs noted Skin: No rashes or ulcers Psychiatry:  Mood & affect appropriate.     Data Reviewed: I have personally reviewed following labs and imaging studies  CBC: Recent Labs  Lab 03/19/18 1425 03/20/18 2045 03/21/18 0500 03/22/18 0526 03/23/18 0620  WBC 5.3 7.2 6.4 6.3 5.8  NEUTROABS 3.5  --   --   --  4.4  HGB 11.5* 12.0* 11.2* 11.5* 11.0*  HCT 33.2* 34.2* 33.1* 33.2* 32.3*  MCV 90.4 89.6 88.7 88.8 89.5  PLT 182 206 198 217 591   Basic Metabolic Panel: Recent Labs  Lab 03/20/18 2045 03/21/18 0500 03/22/18 0526 03/23/18 0620 03/24/18 1024  NA 129* 131* 134* 137 140  K 3.7 3.7 3.9 3.8 3.8  CL 95* 96* 100 102 105  CO2 24 26 29 29 29   GLUCOSE 127* 111* 105* 106* 117*  BUN 23* 18 24* 26* 22*  CREATININE 0.82 0.88 0.90 1.05 1.04    CALCIUM 9.3 9.6 9.7 9.7 9.9   GFR: Estimated Creatinine Clearance: 85.9 mL/min (by C-G formula based on SCr of 1.04 mg/dL). Liver Function Tests: Recent Labs  Lab 03/19/18 1425 03/20/18 2100 03/23/18 0620  AST 35 35 24  ALT 41 40 32  ALKPHOS 45 44 40  BILITOT 0.7 0.5 0.6  PROT 5.3* 5.3* 4.8*  ALBUMIN 2.7* 2.7* 2.4*   No results for input(s): LIPASE, AMYLASE in the last 168 hours. No results for input(s): AMMONIA in the last 168 hours. Coagulation Profile: Recent Labs  Lab 03/21/18 0520  INR 1.16   Cardiac Enzymes: Recent Labs  Lab 03/20/18  2045  TROPONINI <0.03   BNP (last 3 results) No results for input(s): PROBNP in the last 8760 hours. HbA1C: No results for input(s): HGBA1C in the last 72 hours. CBG: Recent Labs  Lab 03/24/18 0614 03/24/18 1227 03/24/18 1754 03/24/18 2332 03/25/18 0636  GLUCAP 103* 96 93 100* 97   Lipid Profile: No results for input(s): CHOL, HDL, LDLCALC, TRIG, CHOLHDL, LDLDIRECT in the last 72 hours. Thyroid Function Tests: No results for input(s): TSH, T4TOTAL, FREET4, T3FREE, THYROIDAB in the last 72 hours. Anemia Panel: Recent Labs    03/23/18 1010  VITAMINB12 926*  FOLATE 13.9   Urine analysis:    Component Value Date/Time   COLORURINE YELLOW (A) 03/20/2018 2256   APPEARANCEUR CLEAR (A) 03/20/2018 2256   LABSPEC 1.021 03/20/2018 2256   PHURINE 6.0 03/20/2018 2256   GLUCOSEU NEGATIVE 03/20/2018 2256   HGBUR NEGATIVE 03/20/2018 2256   BILIRUBINUR NEGATIVE 03/20/2018 2256   KETONESUR 5 (A) 03/20/2018 2256   PROTEINUR NEGATIVE 03/20/2018 2256   NITRITE NEGATIVE 03/20/2018 2256   LEUKOCYTESUR NEGATIVE 03/20/2018 2256   Sepsis Labs: @LABRCNTIP (procalcitonin:4,lacticidven:4) ) Recent Results (from the past 240 hour(s))  Surgical pcr screen     Status: None   Collection Time: 03/21/18 12:11 PM  Result Value Ref Range Status   MRSA, PCR NEGATIVE NEGATIVE Final   Staphylococcus aureus NEGATIVE NEGATIVE Final    Comment:  (NOTE) The Xpert SA Assay (FDA approved for NASAL specimens in patients 65 years of age and older), is one component of a comprehensive surveillance program. It is not intended to diagnose infection nor to guide or monitor treatment. Performed at [facility], [place] [address], [place], [place] [address]          Radiology Studies: No results found.    Scheduled Meds: . aspirin EC  81 mg Oral Daily  . Chlorhexidine Gluconate Cloth  6 each Topical Daily  . desonide   Topical BID  . dextromethorphan-guaiFENesin  1 tablet Oral BID  . enoxaparin (LOVENOX) injection  40 mg Subcutaneous Daily  . feeding supplement (PRO-STAT SUGAR FREE 64)  30 mL Oral BID  . mouth rinse  15 mL Mouth Rinse BID  . metoprolol tartrate  25 mg Oral BID  . multivitamin with minerals  1 tablet Oral Daily  . osimertinib mesylate  80 mg Oral Daily  . polyethylene glycol  17 g Oral Daily  . senna-docusate  1 tablet Oral Daily   Continuous Infusions: . sodium chloride Stopped (03/23/18 1859)  . piperacillin-tazobactam (ZOSYN)  IV Stopped (03/25/18 0903)     LOS: 4 days    Time spent in minutes: 35    Debbe Odea, MD [facility] Pager: [email or web address] Password [email or web address] 03/25/2018, 11:17 AM

## 2018-03-26 ENCOUNTER — Ambulatory Visit: Payer: No Typology Code available for payment source | Admitting: Radiation Oncology

## 2018-03-26 ENCOUNTER — Ambulatory Visit: Payer: No Typology Code available for payment source

## 2018-03-26 DIAGNOSIS — J9601 Acute respiratory failure with hypoxia: Secondary | ICD-10-CM

## 2018-03-26 MED ORDER — LORAZEPAM 1 MG PO TABS
1.0000 mg | ORAL_TABLET | Freq: Every day | ORAL | Status: DC
  Administered 2018-03-26: 1 mg via ORAL
  Filled 2018-03-26: qty 1

## 2018-03-26 NOTE — Plan of Care (Signed)
  Problem: Safety: Goal: Ability to remain free from injury will improve Outcome: Progressing   

## 2018-03-26 NOTE — Progress Notes (Signed)
IP PROGRESS NOTE  Subjective:   [name] reports feeling better.  He has less dyspnea.  He is ambulating in the hall.  He is tolerating a regular diet.  Objective: Vital signs in last 24 hours: Blood pressure 133/76, pulse 88, temperature 97.7 F (36.5 C), temperature source Axillary, resp. rate 11, height 5' 6" (1.676 m), weight 170 lb 9.6 oz (77.4 kg), SpO2 95 %.  Intake/Output from previous day: 06/30 0701 - 07/01 0700 In: -  Out: 5 [Chest Tube:5]  Physical Exam:  HEENT: No thrush Lungs: Decreased breath sounds with inspiratory rhonchi throughout the right chest, no respiratory distress Cardiac: Regular rhythm with premature beats Abdomen: Nontender, no hepatomegaly Extremities: No arm or hand edema, 1+ edema at the low leg and foot bilaterally   Portacath/PICC-without erythema   BMET Recent Labs    03/24/18 1024 03/25/18 0729  NA 140 141  K 3.8 3.5  CL 105 103  CO2 29 31  GLUCOSE 117* 108*  BUN 22* 25*  CREATININE 1.04 1.18  CALCIUM 9.9 9.9     Medications: I have reviewed the patient's current medications.  Assessment/Plan:  1.  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  Staging PET scan 03/01/2018- hypermetabolic anterior right upper lobe mass, hypermetabolic soft tissue throughout the right pleural space, no evidence of distant metastatic disease  Cycle 1 Taxol/carboplatin 03/13/2018  CT chest 03/12/2018 increase in extensive tumor in the right chest with shift of the mediastinum to the left  CT chest 03/21/2018- large complex pleural effusion and pleural-based masses with near complete collapse of the right lung with shift of the mediastinum into the left chest, occlusion of the right mainstem bronchus, left lung airspace densities  Osimertinib started 03/21/2018  Chest radiation started 03/21/2018, completed 5/5 treatment 03/23/2018  2.  Respiratory failure  secondary to #1-improved  3.  CVA 09/07/2016 in setting of a PFO  4.  PFO closure January 2018  5.  Hypertension   [name] appears improved.  The oxygenation has improved.  He has less extremity edema.  He may be ready for discharge within the next few days if he continues to improve.  Recommendations: 1.  Management of respiratory failure per medical service, wean oxygen as tolerated 2.  Continue daily osimertinib 3.  Check CBC 03/27/2018     LOS: 5 days    , MD   03/26/2018, 7:13 AM  

## 2018-03-26 NOTE — Progress Notes (Addendum)
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.  He received chemo and has since developed dyspnea which is worse on laying flat and swelling of arms and legs. In the ER chest x-ray was showing near opacification of the right hemithorax and worsening left mediastinal shift. The catheter was no longer draining. He was transferred to [facility] for CT surgery consult. CT chest revealed a Large complex pleural effusion and pleural based masses with near complete collapse of the right lung and associated mass effect and shift of the mediastinum into the left hemithorax. There is occlusion of the right mainstem bronchus secondary to mass effect and extrinsic compression.  Left lower lobe and lingula airspace densities most consistent with pneumonia.  Dr Roxy Manns evaluated him and decided he was a poor candidate for re-do Vats. It was decided to transfer him to [facility] for urgent radiation.  Subjective: He is feeling well today. He did 6 laps around the nursing station yesterday. Has no complaints.   Assessment & Plan:   Principal Problem:  Primary cancer of right lower lobe of lung - non small cell- 02/23/18- no distant mets SVC syndrome  Acute respiratory failure with hypoxia- Right lung collapse and midline shift due to large right malignant pleural effusion - oncology managing- started daily radiation on 6/26  - cont Osimertinib - one time dose of IV Lasix 20 mg again today- f/u on peripheral edema - O2 weaned down to 3 L today  Active Problems: ? LLL pneumonia - on Vanc and Zosyn since 6/26-MRSA PCR neg-  stopped Vanc 6/28-  give total 5 days of Zosyn    History of stroke/ PFO - s/p PFO coslure on 12/17     OSA on CPAP   HTN - cont Metoprolol  Anemia -  Iron panel shows normal stores   DVT prophylaxis: Lovenox Code Status: full code Family Communication: wife Disposition Plan: cont radiation - transfer to med surg Consultants:   Oncology- medical and radiation  CT surgery   PCCM  IR Procedures:    radiation treatment Antimicrobials:  Anti-infectives (From admission, onward)   Start     Dose/Rate Route Frequency Ordered Stop   03/23/18 1600  vancomycin (VANCOCIN) 1,500 mg in sodium chloride 0.9 % 500 mL IVPB  Status:  Discontinued     1,500 mg 250 mL/hr over 120 Minutes Intravenous Every 24 hours 03/23/18 0920 03/23/18 1009   03/23/18 1000  vancomycin (VANCOCIN) IVPB 1000 mg/200 mL premix  Status:  Discontinued     1,000 mg 200 mL/hr over 60 Minutes Intravenous Every 12 hours 03/23/18 0916 03/23/18 0920   03/22/18 1000  vancomycin (VANCOCIN) IVPB 1000 mg/200 mL premix  Status:  Discontinued     1,000 mg 200 mL/hr over 60 Minutes Intravenous Every 12 hours 03/22/18 0746 03/23/18 0916   03/22/18 0600  vancomycin (VANCOCIN) IVPB 1000 mg/200 mL premix  Status:  Discontinued     1,000 mg 200 mL/hr over 60 Minutes Intravenous Every 12 hours 03/21/18 1942 03/22/18 0746   03/21/18 1330  piperacillin-tazobactam (ZOSYN) IVPB 3.375 g     3.375 g 12.5 mL/hr over 240 Minutes Intravenous  Every 8 hours 03/21/18 0509 03/26/18 0016   03/21/18 1330  vancomycin (VANCOCIN) IVPB 750 mg/150 ml premix  Status:  Discontinued     750 mg 150 mL/hr over 60 Minutes Intravenous Every 8 hours 03/21/18 0509 03/21/18 1931   03/21/18 0515  piperacillin-tazobactam (ZOSYN) IVPB 3.375 g     3.375 g 100 mL/hr over 30 Minutes Intravenous  Once 03/21/18 0503 03/21/18 0635   03/21/18 0515  vancomycin (VANCOCIN) 1,500 mg in sodium chloride 0.9 % 500 mL IVPB     1,500 mg 250 mL/hr over 120 Minutes  Intravenous  Once 03/21/18 0503 03/21/18 0732       Objective: Vitals:   03/26/18 0700 03/26/18 0740 03/26/18 0800 03/26/18 0820  BP: (!) 141/79  124/85   Pulse: 77  81   Resp: 11  19   Temp:  97.9 F (36.6 C)    TempSrc:  Oral    SpO2: 94%  98% 98%  Weight:      Height:        Intake/Output Summary (Last 24 hours) at 03/26/2018 0937 Last data filed at 03/26/2018 0800 Gross per 24 hour  Intake 200 ml  Output 5 ml  Net 195 ml   Filed Weights   03/21/18 0120  Weight: 77.4 kg (170 lb 9.6 oz)    Examination: General exam: Appears comfortable  HEENT: PERRLA, oral mucosa moist, no sclera icterus or thrush Respiratory system: Clear to auscultation. Respiratory effort normal. Pulse ox 96% on 3 L Cardiovascular system: S1 & S2 heard, RRR.   Gastrointestinal system: Abdomen soft, non-tender, nondistended. Normal bowel sound. No organomegaly Central nervous system: Alert and oriented. No focal neurological deficits. Extremities: No cyanosis, clubbing - swelling of arms has resolved. Leg still moderately swollen. 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     Data Reviewed: I have personally reviewed following labs and imaging studies  CBC: Recent Labs  Lab 03/19/18 1425 03/20/18 2045 03/21/18 0500 03/22/18 0526 03/23/18 0620  WBC 5.3 7.2 6.4 6.3 5.8  NEUTROABS 3.5  --   --   --  4.4  HGB 11.5* 12.0* 11.2* 11.5* 11.0*  HCT 33.2* 34.2* 33.1* 33.2* 32.3*  MCV 90.4 89.6 88.7 88.8 89.5  PLT 182 206 198 217 233   Basic Metabolic Panel: Recent Labs  Lab 03/21/18 0500 03/22/18 0526 03/23/18 0620 03/24/18 1024 03/25/18 0729  NA 131* 134* 137 140 141  K 3.7 3.9 3.8 3.8 3.5  CL 96* 100 102 105 103  CO2 26 29 29 29 31   GLUCOSE 111* 105* 106* 117* 108*  BUN 18 24* 26* 22* 25*  CREATININE 0.88 0.90 1.05 1.04 1.18  CALCIUM 9.6 9.7 9.7 9.9 9.9   GFR: Estimated Creatinine Clearance: 75.7 mL/min (by C-G formula based on SCr of 1.18  mg/dL). Liver Function Tests: Recent Labs  Lab 03/19/18 1425 03/20/18 2100 03/23/18 0620  AST 35 35 24  ALT 41 40 32  ALKPHOS 45 44 40  BILITOT 0.7 0.5 0.6  PROT 5.3* 5.3* 4.8*  ALBUMIN 2.7* 2.7* 2.4*   No results for input(s): LIPASE, AMYLASE in the last 168 hours. No results for input(s): AMMONIA in the last 168 hours. Coagulation Profile: Recent Labs  Lab 03/21/18 0520  INR 1.16   Cardiac Enzymes: Recent Labs  Lab 03/20/18 2045  TROPONINI <0.03   BNP (last 3 results) No results for input(s): PROBNP in the last 8760 hours. HbA1C: No results for input(s): HGBA1C in the last  72 hours. CBG: Recent Labs  Lab 03/24/18 1754 03/24/18 2332 03/25/18 0636 03/25/18 1205 03/25/18 1806  GLUCAP 93 100* 97 101* 121*   Lipid Profile: No results for input(s): CHOL, HDL, LDLCALC, TRIG, CHOLHDL, LDLDIRECT in the last 72 hours. Thyroid Function Tests: No results for input(s): TSH, T4TOTAL, FREET4, T3FREE, THYROIDAB in the last 72 hours. Anemia Panel: Recent Labs    03/23/18 1010  VITAMINB12 926*  FOLATE 13.9   Urine analysis:    Component Value Date/Time   COLORURINE YELLOW (A) 03/20/2018 2256   APPEARANCEUR CLEAR (A) 03/20/2018 2256   LABSPEC 1.021 03/20/2018 2256   PHURINE 6.0 03/20/2018 2256   GLUCOSEU NEGATIVE 03/20/2018 2256   HGBUR NEGATIVE 03/20/2018 2256   BILIRUBINUR NEGATIVE 03/20/2018 2256   KETONESUR 5 (A) 03/20/2018 2256   PROTEINUR NEGATIVE 03/20/2018 2256   NITRITE NEGATIVE 03/20/2018 2256   LEUKOCYTESUR NEGATIVE 03/20/2018 2256   Sepsis Labs: @LABRCNTIP (procalcitonin:4,lacticidven:4) ) Recent Results (from the past 240 hour(s))  Surgical pcr screen     Status: None   Collection Time: 03/21/18 12:11 PM  Result Value Ref Range Status   MRSA, PCR NEGATIVE NEGATIVE Final   Staphylococcus aureus NEGATIVE NEGATIVE Final    Comment: (NOTE) The Xpert SA Assay (FDA approved for NASAL specimens in patients 59 years of age and older), is one component  of a comprehensive surveillance program. It is not intended to diagnose infection nor to guide or monitor treatment. Performed at [facility], [place] [address], [place], [place] [address]          Radiology Studies: No results found.    Scheduled Meds: . aspirin EC  81 mg Oral Daily  . Chlorhexidine Gluconate Cloth  6 each Topical Daily  . desonide   Topical BID  . dextromethorphan-guaiFENesin  1 tablet Oral BID  . enoxaparin (LOVENOX) injection  40 mg Subcutaneous Daily  . feeding supplement (PRO-STAT SUGAR FREE 64)  30 mL Oral BID  . LORazepam  1 mg Oral QHS  . mouth rinse  15 mL Mouth Rinse BID  . metoprolol tartrate  25 mg Oral BID  . multivitamin with minerals  1 tablet Oral Daily  . osimertinib mesylate  80 mg Oral Daily  . polyethylene glycol  17 g Oral Daily  . senna-docusate  1 tablet Oral Daily   Continuous Infusions: . sodium chloride Stopped (03/23/18 1859)     LOS: 5 days    Time spent in minutes: 35    Debbe Odea, MD [facility] Pager: [email or web address] Password [email or web address] 03/26/2018, 9:37 AM

## 2018-03-27 ENCOUNTER — Ambulatory Visit: Payer: No Typology Code available for payment source

## 2018-03-27 ENCOUNTER — Ambulatory Visit: Payer: No Typology Code available for payment source | Admitting: Radiation Oncology

## 2018-03-27 DIAGNOSIS — I871 Compression of vein: Secondary | ICD-10-CM

## 2018-03-27 DIAGNOSIS — I493 Ventricular premature depolarization: Secondary | ICD-10-CM

## 2018-03-27 DIAGNOSIS — C3492 Malignant neoplasm of unspecified part of left bronchus or lung: Secondary | ICD-10-CM

## 2018-03-27 DIAGNOSIS — C3491 Malignant neoplasm of unspecified part of right bronchus or lung: Secondary | ICD-10-CM

## 2018-03-27 LAB — CBC WITH DIFFERENTIAL/PLATELET
BASOS ABS: 0 10*3/uL (ref 0.0–0.1)
Basophils Relative: 1 %
Eosinophils Absolute: 0.2 10*3/uL (ref 0.0–0.7)
Eosinophils Relative: 6 %
HEMATOCRIT: 30.6 % — AB (ref 39.0–52.0)
Hemoglobin: 10.3 g/dL — ABNORMAL LOW (ref 13.0–17.0)
LYMPHS ABS: 1.1 10*3/uL (ref 0.7–4.0)
LYMPHS PCT: 39 %
MCH: 30.5 pg (ref 26.0–34.0)
MCHC: 33.7 g/dL (ref 30.0–36.0)
MCV: 90.5 fL (ref 78.0–100.0)
MONO ABS: 0.5 10*3/uL (ref 0.1–1.0)
Monocytes Relative: 17 %
NEUTROS ABS: 1.1 10*3/uL — AB (ref 1.7–7.7)
Neutrophils Relative %: 37 %
Platelets: 143 10*3/uL — ABNORMAL LOW (ref 150–400)
RBC: 3.38 MIL/uL — ABNORMAL LOW (ref 4.22–5.81)
RDW: 13 % (ref 11.5–15.5)
WBC: 2.8 10*3/uL — ABNORMAL LOW (ref 4.0–10.5)

## 2018-03-27 LAB — CBC
HEMATOCRIT: 32.3 % — AB (ref 39.0–52.0)
HEMOGLOBIN: 10.7 g/dL — AB (ref 13.0–17.0)
MCH: 30.1 pg (ref 26.0–34.0)
MCHC: 33.1 g/dL (ref 30.0–36.0)
MCV: 90.7 fL (ref 78.0–100.0)
Platelets: 132 10*3/uL — ABNORMAL LOW (ref 150–400)
RBC: 3.56 MIL/uL — ABNORMAL LOW (ref 4.22–5.81)
RDW: 12.9 % (ref 11.5–15.5)
WBC: 2.6 10*3/uL — ABNORMAL LOW (ref 4.0–10.5)

## 2018-03-27 MED ORDER — SODIUM CHLORIDE 0.9% FLUSH: 10.0000 mL | INTRAVENOUS | Status: DC | PRN

## 2018-03-27 MED ORDER — DM-GUAIFENESIN ER 30-600 MG PO TB12
1.0000 | ORAL_TABLET | Freq: Two times a day (BID) | ORAL | 0 refills | Status: DC
Start: 2018-03-27 — End: 2018-04-24

## 2018-03-27 MED ORDER — HEPARIN SOD (PORK) LOCK FLUSH 100 UNIT/ML IV SOLN
500.0000 [IU] | INTRAVENOUS | Status: AC | PRN
  Administered 2018-03-27: 500 [IU]

## 2018-03-27 MED ORDER — ACETAMINOPHEN 325 MG PO TABS: 650.0000 mg | ORAL_TABLET | Freq: Four times a day (QID) | ORAL | Status: AC | PRN

## 2018-03-27 MED ORDER — POLYETHYLENE GLYCOL 3350 17 G PO PACK: 17.0000 g | PACK | Freq: Every day | ORAL | 0 refills | Status: DC | PRN

## 2018-03-27 MED ORDER — PRO-STAT SUGAR FREE PO LIQD: 30.0000 mL | Freq: Two times a day (BID) | ORAL | 0 refills | Status: DC

## 2018-03-27 NOTE — Discharge Summary (Signed)
Physician Discharge Summary  Ruben Eaton IRW:[id number] DOB: 1971/03/27 DOA: 03/21/2018  PCP: Dion Body, MD  Admit date: 03/21/2018 Discharge date: 03/27/2018  Admitted From: home Disposition:  home   Recommendations for Outpatient Follow-up:  1. 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  He received chemo and has since developed dyspnea which is worse on laying flat and swelling of arms and legs. In the ER chest x-ray was showing near opacification of the right hemithorax and worsening left mediastinal shift. The catheter was no longer draining. He was transferred to [facility] for CT surgery consult. CT chest revealed a Large complex pleural effusion and pleural based masses with near complete collapse of the right lung and associated mass effect and shift of the mediastinum into the left hemithorax. There is occlusion of the right mainstem bronchus secondary to mass effect and extrinsic compression.  Left lower lobe and lingula airspace densities most consistent with pneumonia.  Dr Roxy Manns evaluated him and decided he was a poor candidate for re-do Vats. It was decided to transfer him to [facility] for  urgent radiation.    Hospital Course:  Primary cancer of right lower lobe of lung - non small cell- 02/23/18- no distant mets SVC syndrome  Acute respiratory failure with hypoxia- Right lung collapse and midline shift due to large right malignant pleural effusion - oncology at [facility], Dr Rogue Bussing, managing- started daily radiation on 6/26  - cont Osimertinib - one time dose of IV Lasix 20 mg given x 2 days-  peripheral edema has improved considerably - O2 weaned down to 2 L today  Active Problems: ? LLL pneumonia - on Vanc and Zosyn since 6/26 -MRSA PCR neg and thus stopped Vanc 6/28 -  given total 5 days of Zosyn  Pancytopenia - WBC 2.6, Hb 10.7 and platelets 132 today- per oncology, this is likely related to [facility]  - will need a repeat CBC on Friday at his next visit    History of stroke/ PFO - s/p PFO coslure on 12/17     OSA on CPAP   HTN - cont Metoprolol  Anemia -  Iron panel shows normal stores   Discharge Exam: Vitals:   03/26/18 2037 03/27/18 0545  BP: 123/79 124/80  Pulse: 85 81  Resp: 19 19  Temp: 97.7 F (36.5 C) 97.8 F (36.6 C)  SpO2: 99% 95%   Vitals:   03/26/18 1436 03/26/18 2011 03/26/18 2037 03/27/18 0545  BP: 125/83  123/79 124/80  Pulse: 89 84 85 81  Resp: 18 18 19 19   Temp: 97.6 F (36.4 C)  97.7 F (36.5 C) 97.8 F (36.6 C)  TempSrc:   Oral Oral  SpO2: 96% 96% 99% 95%  Weight:      Height:        General: Pt is alert, awake, not in acute distress Cardiovascular: RRR, S1/S2 +, no rubs, no gallops Respiratory: CTA bilaterally, no wheezing, no rhonchi Abdominal: Soft, NT, ND, bowel sounds + Extremities: no edema, no cyanosis   Discharge Instructions  Discharge Instructions    Diet - low sodium heart healthy   Complete by:  As directed    Increase activity slowly   Complete by:  As directed      Allergies as of 03/27/2018   No Known Allergies     Medication List    STOP taking these medications   senna-docusate  8.6-50 MG tablet Commonly known as:  Senokot-S     TAKE these medications   acetaminophen 325 MG tablet Commonly known as:  TYLENOL Take 2 tablets (650 mg total) by mouth every 6 (six) hours as needed for mild pain (or Fever >/= 101).   aspirin 81 MG EC tablet Take 1 tablet (81 mg total) by mouth daily.   atorvastatin 40 MG tablet Commonly known as:  LIPITOR Take 1 tablet (40 mg total) by mouth daily at 6 PM.   B-complex with vitamin C tablet Take 1 tablet by mouth daily.   dexamethasone 4 MG tablet Commonly known as:  DECADRON Take 2 tablets (8 mg total) by mouth daily. Start the day after chemotherapy for 2 days.   dextromethorphan-guaiFENesin 30-600 MG 12hr tablet Commonly known as:  MUCINEX DM Take 1 tablet by mouth 2 (two) times daily.   Diclofenac Sodium 2 % Soln Apply 1 pump twice daily as needed. What changed:    how much to take  how to take this  when to take this  reasons to take this  additional instructions   feeding supplement (PRO-STAT SUGAR FREE 64) Liqd Take 30 mLs by mouth 2 (two) times daily.   loratadine 10 MG tablet Commonly known as:  CLARITIN Take 10 mg by mouth daily as needed for allergies.   LORazepam 0.5 MG tablet Commonly known as:  ATIVAN Take 1 tablet (0.5 mg total) by mouth every 8 (eight) hours as needed for anxiety.   metoprolol tartrate 25 MG tablet Commonly known as:  LOPRESSOR Take 1 tablet (25 mg total) by mouth 2 (two) times daily.   morphine CONCENTRATE 10 mg / 0.5 ml concentrated solution Take 1 mL (20 mg total) by mouth every 4 (four) hours as needed for shortness of breath. What changed:    how much to take  Another medication with the same name was removed. Continue taking this medication, and follow the directions you see here.   multivitamin with minerals Tabs tablet Take 1 tablet by mouth daily.   ondansetron 8 MG tablet Commonly known as:  ZOFRAN Take 1 tablet (8 mg total) by mouth 2 (two) times daily  as needed for refractory nausea / vomiting.   osimertinib mesylate 80 MG tablet Commonly known as:  TAGRISSO Take 1 tablet (80 mg total) by mouth daily.   polyethylene glycol packet Commonly known as:  MIRALAX / GLYCOLAX Take 17 g by mouth daily as needed.   prochlorperazine 10 MG tablet Commonly known as:  COMPAZINE Take 1 tablet (10 mg total) by mouth every 6 (six) hours as needed (Nausea or vomiting).   tadalafil 5 MG tablet Commonly known as:  CIALIS Take 5 mg by mouth daily as needed for erectile dysfunction.   triamcinolone 55 MCG/ACT Aero nasal inhaler Commonly known  as:  NASACORT Place 2 sprays into the nose daily as needed (congestion).   Vitamin D (Ergocalciferol) 50000 units Caps capsule Commonly known as:  DRISDOL Take 1 capsule (50,000 Units total) by mouth every 7 (seven) days. What changed:  additional instructions   zolpidem 10 MG tablet Commonly known as:  AMBIEN Take 10 mg by mouth at bedtime as needed for sleep.            Durable Medical Equipment  (From admission, onward)        Start     Ordered   03/27/18 1058  For home use only DME oxygen  Once    Question Answer Comment  Mode or (Route) Nasal cannula   Liters per Minute 2   Frequency Continuous (stationary and portable oxygen unit needed)   Oxygen conserving device No   Oxygen delivery system Gas      03/27/18 1057      No Known Allergies   Procedures/Studies:    Dg Chest 2 View  Result Date: 03/12/2018 CLINICAL DATA:  RIGHT-side pleural effusion, shortness of breath RIGHT lower lobe lung cancer, former smoker EXAM: CHEST - 2 VIEW COMPARISON:  03/07/2018 FINDINGS: RIGHT jugular Port-A-Cath with tip projecting over SVC. RIGHT thoracostomy tube. Stable heart size. Loculated RIGHT pleural effusion and nodularity throughout the RIGHT hemithorax from known malignant effusion. Mediastinal shift RIGHT to LEFT increased. LEFT lung clear. No LEFT pneumothorax identified. Osseous structures  unremarkable. IMPRESSION: Known RIGHT pleural carcinomatosis and malignant effusion with increased RIGHT pleural opacity and RIGHT lung atelectasis since 03/07/2018. Increased RIGHT to LEFT midline shift. Electronically Signed   By: Lavonia Dana M.D.   On: 03/12/2018 11:35   Dg Chest 2 View  Result Date: 03/07/2018 CLINICAL DATA:  Lung carcinoma.  Cough. EXAM: CHEST - 2 VIEW COMPARISON:  March 05, 2018 FINDINGS: Right pleural drain is again noted with the tip at the right apex level. Port-A-Cath tip is in the superior vena cava. No pneumothorax. There are areas of apparent loculated effusion as well as consolidation throughout the right lung. The left lung is clear. Heart size and pulmonary vascularity are normal. IMPRESSION: No appreciable change compared to most recent chest radiograph. Areas of loculated effusion as well as consolidation and probable underlying mases on the right with opacification of much of the right lung, stable. Left lung clear. Stable cardiac silhouette. Electronically Signed   By: Lowella Grip III M.D.   On: 03/07/2018 10:10   Dg Chest 2 View  Result Date: 03/05/2018 CLINICAL DATA:  47 year old male with shortness of breath for 2 weeks. Right lung cancer diagnosed in the past several weeks. EXAM: CHEST - 2 VIEW COMPARISON:  PET-CT 03/01/2018 and earlier. FINDINGS: Right chest IJ approach power port appear stable, not currently accessed. Superimposed right pleural drain. Regressed large right pleural effusion and patchy improved right lung ventilation since the radiographs on 02/21/2018. Residual loculated pleural fluid and/or pleural thickening. Associated thickening of the right paratracheal stripe and hilum. Left mediastinal contours remain normal. A cardiac interatrial occluded device is re-demonstrated. Small left pleural effusion is stable since 03/01/2018, otherwise the left lung is clear. Stable visualized osseous structures. Negative visible bowel gas pattern. IMPRESSION:  1. Right pleural drain in place with regressed right pleural effusion and patchy improved right lung ventilation since 02/21/2018. Residual loculated pleural fluid and/or bulky pleural thickening. 2. Stable small layering left pleural effusion. 3. No new cardiopulmonary abnormality identified. Electronically Signed   By: Herminio Heads.D.  On: 03/05/2018 15:27   Ct Angio Chest Pe W Or Wo Contrast  Result Date: 03/21/2018 CLINICAL DATA:  47 year old male with shortness of breath. Patient receiving chemotherapy for right lower lobe lung cancer. EXAM: CT ANGIOGRAPHY CHEST WITH CONTRAST TECHNIQUE: Multidetector CT imaging of the chest was performed using the standard protocol during bolus administration of intravenous contrast. Multiplanar CT image reconstructions and MIPs were obtained to evaluate the vascular anatomy. CONTRAST:  60mL ISOVUE-370 IOPAMIDOL (ISOVUE-370) INJECTION 76% COMPARISON:  None. FINDINGS: Cardiovascular: There is no cardiomegaly or pericardial effusion. An Amplatzer occlusive device of patent foramen ovale. The thoracic aorta is unremarkable. The origins of the great vessels of the aortic arch are patent. Evaluation of the pulmonary arteries is limited due to suboptimal enhancement of the peripheral branches. No large or central pulmonary artery embolus identified. Mediastinum/Nodes: There is no adenopathy in the region of the left hilum. Evaluation of the right hilum is limited due to consolidative changes of the right lung. There is shift of the mediastinum into the left hemithorax. Mildly enlarged anterior mediastinal lymph nodes measure up to 15 mm in short axis. A Port-A-Cath is noted with tip in the region of the cavoatrial junction. Lungs/Pleura: There is a large somewhat loculated and complex right pleural effusion and pleural based masses. There is compressive atelectasis and collapse of the right lung. There is only a small aerated portion of the right upper lobe. There are ill-defined  hypodense masses in the right chest involving the right lung or pleura which are suboptimally visualized and poorly evaluated. There is mass effect and shift of the mediastinum into the left hemithorax. There is extension of fluid or pleural masses posterior to the mediastinum. A right-sided pleural base tube is seen with tip in the right apical region. There is mass effect and narrowing of the right upper lobe pulmonary artery and high-grade narrowing and complete occlusion of the right mainstem bronchus. Diffuse confluent ground-glass nodular densities in the left lung primarily involving the left lower lobe and lingula most consistent with pneumonia although metastatic disease is not excluded. There is no pneumothorax. Upper Abdomen: No acute abnormality. Musculoskeletal: No chest wall abnormality. No acute or significant osseous findings. Review of the MIP images confirms the above findings. IMPRESSION: 1. No CT evidence of pulmonary embolism. 2. Large complex pleural effusion and pleural based masses with near complete collapse of the right lung and associated mass effect and shift of the mediastinum into the left hemithorax. There is occlusion of the right mainstem bronchus secondary to mass effect and extrinsic compression. 3. Left lower lobe and lingula airspace densities most consistent with pneumonia. Clinical correlation recommended. Electronically Signed   By: Anner Crete M.D.   On: 03/21/2018 04:46   Ct Angio Chest Pe W Or Wo Contrast  Result Date: 03/12/2018 CLINICAL DATA:  Increasing shortness of breath. EXAM: CT ANGIOGRAPHY CHEST WITH CONTRAST TECHNIQUE: Multidetector CT imaging of the chest was performed using the standard protocol during bolus administration of intravenous contrast. Multiplanar CT image reconstructions and MIPs were obtained to evaluate the vascular anatomy. CONTRAST:  61mL ISOVUE-370 IOPAMIDOL (ISOVUE-370) INJECTION 76% COMPARISON:  PET-CT dated 03/01/2018 and chest CT  dated 02/21/2018 FINDINGS: Cardiovascular: There is due shift of the heart and other mediastinal structures to the left by the rapidly expanding extensive tumor in the right hemithorax. There are no pulmonary emboli. There is slight compression of the right main pulmonary artery back extensive mediastinal and hilar adenopathy. Port-A-Cath tip at the cavoatrial junction. Mediastinum/Nodes: Rapid  increased extensive right hilar and mediastinal adenopathy. Rapid expansion of subcarinal adenopathy and adenopathy adjacent to the distal esophagus. Lungs/Pleura: There has been quite rapid growth of the extensive tumor in the right hemithorax as well as expansion of a cystic area in the right midzone with further compressive atelectasis of the remaining portion of the right lung. There is a small right chest tube in place. No significant residual right effusion. Left effusion has resolved since the prior study. Small new focal areas of patchy infiltrate in the anterior aspect of the left upper lobe. Upper Abdomen: There is extensive tumor along the superior surface of the right hemidiaphragm depressing the right hemidiaphragm and the right lobe of the liver. Musculoskeletal: No chest wall abnormality. No acute or significant osseous findings. Review of the MIP images confirms the above findings. IMPRESSION: 1. Very rapid increase in extensive tumor in the right hemithorax since the PET-CT scan done 11 days ago. 2. Shift of the heart and mediastinal structures to the left with depression of the right hemidiaphragm due to the extensive tumor in the right hemithorax and in the mediastinum. 3. No pulmonary emboli. 4. Further compression of the right lung by tumor. 5. Expansion of loculated fluid in the right midzone. 6. Resolution of left pleural effusion. Critical Value/emergent results were called by telephone at the time of interpretation on 03/12/2018 at 12:11 pm to [place], NP, who verbally acknowledged these  results. Electronically Signed   By: Lorriane Shire M.D.   On: 03/12/2018 12:19   Nm Pet Image Initial (pi) Skull Base To Thigh  Result Date: 03/01/2018 CLINICAL DATA:  Initial treatment strategy for right lung carcinoma. EXAM: NUCLEAR MEDICINE PET SKULL BASE TO THIGH TECHNIQUE: 9.6 mCi F-18 FDG was injected intravenously. Full-ring PET imaging was performed from the skull base to thigh after the radiotracer. CT data was obtained and used for attenuation correction and anatomic localization. Fasting blood glucose: 86 mg/dl COMPARISON:  Chest CT on 02/21/2018 FINDINGS: (Mediastinal blood pool activity: SUV max = 1.7) NECK:  No hypermetabolic lymph nodes or masses. Incidental CT findings:  None. CHEST: No hypermetabolic lymphadenopathy. Right pleural effusion is decreased in size since previous study and right pleural drain is seen in place. Large masslike opacity in the anterior inferior right upper lobe is seen which abuts the anterior chest wall and measures approximately 12.0 x 6.5 cm on image 87/3. This has SUV max of 12.0, and is suspicious for primary bronchogenic carcinoma. Bulky hypermetabolic soft tissue density is seen throughout the right pleural space, consistent with pleural metastatic disease. Small left pleural effusion is seen, without FDG uptake. No suspicious left lung nodules or masses identified. Incidental CT findings:  None. ABDOMEN/PELVIS: No abnormal hypermetabolic activity within the liver, pancreas, adrenal glands, or spleen. No hypermetabolic lymph nodes in the abdomen or pelvis. Incidental CT findings:  Mild ascites seen, without FDG uptake. SKELETON: No focal hypermetabolic bone lesions to suggest skeletal metastasis. Incidental CT findings:  None. IMPRESSION: Large hypermetabolic masslike opacity in the anterior right upper lobe, suspicious for primary bronchogenic carcinoma. Bulky hypermetabolic soft tissue density throughout the right pleural space, consistent with metastatic  disease. No evidence of metastatic disease within the neck, abdomen, or pelvis. Electronically Signed   By: Earle Gell M.D.   On: 03/01/2018 11:28   Dg Chest Port 1 View  Result Date: 03/23/2018 CLINICAL DATA:  47 year old male with acute respiratory failure. History of VATS on 02/23/2018 and right lower lobe cancer. EXAM: PORTABLE CHEST 1 VIEW  COMPARISON:  None. FINDINGS: Right-sided Port-A-Cath with tip over the mediastinum. There is a large pleural effusion with near complete consolidative changes of the right lung. There is only a small aerated portion of the right lung in the upper lobe. Left lung base densities may represent atelectasis or infiltrate. A small left pleural effusion may be present. There is no pneumothorax. There is silhouetting of the right cardiac border. No acute osseous pathology. IMPRESSION: Near complete opacification of the right hemithorax secondary to large right pleural effusion and associated right lung compressive atelectasis or consolidation. Left lung base atelectasis versus infiltrate. Electronically Signed   By: Anner Crete M.D.   On: 03/23/2018 05:04   Dg Chest Port 1 View  Result Date: 03/20/2018 CLINICAL DATA:  Recently diagnosed right upper lobe lung cancer with malignant right pleural effusion status post right VATS with talc pleurodesis. Worsening dyspnea EXAM: PORTABLE CHEST 1 VIEW COMPARISON:  03/12/2018 chest radiograph. FINDINGS: Right internal jugular MediPort terminates at the cavoatrial junction. Right apical chest tube is in place. Worsening near complete opacification of the right hemithorax with minimal residual aeration in the right upper lung. Worsening marked circumferential right pleural thickening/effusion. Worsened left mediastinal shift. Normal heart size. Stable mediastinal contour. No pneumothorax. No left pleural effusion. Increased patchy left retrocardiac opacity. IMPRESSION: 1. Worsening near complete opacification of the right  hemithorax with minimal residual aeration in the right upper lung and worsening diffuse marked right pleural thickening/effusion. 2. Worsening left mediastinal shift with increased patchy left retrocardiac opacity, probably atelectasis. Electronically Signed   By: Ilona Sorrel M.D.   On: 03/20/2018 21:19   Dg Chest Port 1 View  Result Date: 02/26/2018 CLINICAL DATA:  Chest tube in place.  Lung cancer. EXAM: PORTABLE CHEST 1 VIEW COMPARISON:  One-view chest x-ray 02/23/2018 FINDINGS: Heart size is normal. A right IJ Port-A-Cath is stable. Two right-sided chest tubes are stable in position. Interstitial and airspace disease is increasing on the right. A right pleural effusion is suspected. The left lung is clear. The visualized soft tissues and bony thorax are unremarkable. No residual pneumothorax is present. IMPRESSION: 1. Increasing right pleural effusion and airspace disease. 2. PleurX catheter and right-sided chest tube stable in position. 3. Right IJ catheter stable. Electronically Signed   By: San Morelle M.D.   On: 02/26/2018 07:17   Ir US Chest  Result Date: 03/21/2018 CLINICAL DATA:  47 year old male with mediastinal shift and SVC syndrome EXAM: CHEST ULTRASOUND COMPARISON:  CT 03/21/2018, 03/01/2018, 02/21/2018 FINDINGS: Limited ultrasound images were performed in order to locate potential fluid for aspiration/drainage. Ultrasound survey of the anterior and posterior chest demonstrates tumor tissue occupying space around the lungs. Drainage deferred at this time. IMPRESSION: Limited ultrasound images of the chest demonstrate no localized fluid collection for drainage. Drainage deferred at this time. Electronically Signed   By: Corrie Mckusick D.O.   On: 03/21/2018 16:57     The results of significant diagnostics from this hospitalization (including imaging, microbiology, ancillary and laboratory) are listed below for reference.     Microbiology: Recent Results (from the past 240  hour(s))  Surgical pcr screen     Status: None   Collection Time: 03/21/18 12:11 PM  Result Value Ref Range Status   MRSA, PCR NEGATIVE NEGATIVE Final   Staphylococcus aureus NEGATIVE NEGATIVE Final    Comment: (NOTE) The Xpert SA Assay (FDA approved for NASAL specimens in patients 26 years of age and older), is one component of a comprehensive surveillance program. It  is not intended to diagnose infection nor to guide or monitor treatment. Performed at [facility], [place] [address] [place], [place] [address]      Labs: BNP (last 3 results) Recent Labs    03/12/18 1115 03/20/18 2045  BNP 76.0 36.6   Basic Metabolic Panel: Recent Labs  Lab 03/21/18 0500 03/22/18 0526 03/23/18 0620 03/24/18 1024 03/25/18 0729  NA 131* 134* 137 140 141  K 3.7 3.9 3.8 3.8 3.5  CL 96* 100 102 105 103  CO2 26 29 29 29 31   GLUCOSE 111* 105* 106* 117* 108*  BUN 18 24* 26* 22* 25*  CREATININE 0.88 0.90 1.05 1.04 1.18  CALCIUM 9.6 9.7 9.7 9.9 9.9   Liver Function Tests: Recent Labs  Lab 03/20/18 2100 03/23/18 0620  AST 35 24  ALT 40 32  ALKPHOS 44 40  BILITOT 0.5 0.6  PROT 5.3* 4.8*  ALBUMIN 2.7* 2.4*   No results for input(s): LIPASE, AMYLASE in the last 168 hours. No results for input(s): AMMONIA in the last 168 hours. CBC: Recent Labs  Lab 03/21/18 0500 03/22/18 0526 03/23/18 0620 03/27/18 0510 03/27/18 1021  WBC 6.4 6.3 5.8 2.8* 2.6*  NEUTROABS  --   --  4.4 1.1*  --   HGB 11.2* 11.5* 11.0* 10.3* 10.7*  HCT 33.1* 33.2* 32.3* 30.6* 32.3*  MCV 88.7 88.8 89.5 90.5 90.7  PLT 198 217 195 143* 132*   Cardiac Enzymes: Recent Labs  Lab 03/20/18 2045  TROPONINI <0.03   BNP: Invalid input(s): POCBNP CBG: Recent Labs  Lab 03/24/18 1754 03/24/18 2332 03/25/18 0636 03/25/18 1205 03/25/18 1806  GLUCAP 93 100* 97 101* 121*   D-Dimer No results for input(s): DDIMER in the last 72 hours. Hgb A1c No results for input(s): HGBA1C in the last 72 hours. Lipid  Profile No results for input(s): CHOL, HDL, LDLCALC, TRIG, CHOLHDL, LDLDIRECT in the last 72 hours. Thyroid function studies No results for input(s): TSH, T4TOTAL, T3FREE, THYROIDAB in the last 72 hours.  Invalid input(s): FREET3 Anemia work up No results for input(s): VITAMINB12, FOLATE, FERRITIN, TIBC, IRON, RETICCTPCT in the last 72 hours. Urinalysis    Component Value Date/Time   COLORURINE YELLOW (A) 03/20/2018 2256   APPEARANCEUR CLEAR (A) 03/20/2018 2256   LABSPEC 1.021 03/20/2018 2256   PHURINE 6.0 03/20/2018 2256   GLUCOSEU NEGATIVE 03/20/2018 2256   HGBUR NEGATIVE 03/20/2018 2256   BILIRUBINUR NEGATIVE 03/20/2018 2256   KETONESUR 5 (A) 03/20/2018 2256   PROTEINUR NEGATIVE 03/20/2018 2256   NITRITE NEGATIVE 03/20/2018 2256   LEUKOCYTESUR NEGATIVE 03/20/2018 2256   Sepsis Labs Invalid input(s): PROCALCITONIN,  WBC,  LACTICIDVEN Microbiology Recent Results (from the past 240 hour(s))  Surgical pcr screen     Status: None   Collection Time: 03/21/18 12:11 PM  Result Value Ref Range Status   MRSA, PCR NEGATIVE NEGATIVE Final   Staphylococcus aureus NEGATIVE NEGATIVE Final    Comment: (NOTE) The Xpert SA Assay (FDA approved for NASAL specimens in patients 44 years of age and older), is one component of a comprehensive surveillance program. It is not intended to diagnose infection nor to guide or monitor treatment. Performed at [facility], [place] [address], [place], [place] [address]      Time coordinating discharge in minutes: 60  SIGNED:

## 2018-03-27 NOTE — Progress Notes (Signed)
IP PROGRESS NOTE  Subjective:   Ruben Eaton reports congestion this morning.  He is eating and ambulating.  The leg edema has improved.  Objective: Vital signs in last 24 hours: Blood pressure 124/80, pulse 81, temperature 97.8 F (36.6 C), temperature source Oral, resp. rate 19, height _0  (1.676 m), weight 170 lb 9.6 oz (77.4 kg), SpO2 95 %.  Intake/Output from previous day: 07/01 0701 - 07/02 0700 In: 200 [P.O.:200] Out: -   Physical Exam:  HEENT: No thrush Lungs: Decreased breath sounds in the right compared to the left chest.  No respiratory distress. Cardiac: Regular rhythm with premature beats Abdomen: Nontender, no hepatomegaly Extremities: 1+ pitting edema at the lower leg bilaterally-improved Skin: Mild erythema at the right upper anterior chest   Portacath/PICC-without erythema   BMET Recent Labs    03/24/18 1024 03/25/18 0729  NA 140 141  K 3.8 3.5  CL 105 103  CO2 29 31  GLUCOSE 117* 108*  BUN 22* 25*  CREATININE 1.04 1.18  CALCIUM 9.9 9.9  Hemoglobin 10.3, platelets 143,000, white count 2.8, ANC 1.1   Medications: I have reviewed the patient's current medications.  Assessment/Plan:  1.  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  Staging PET scan 05/01/7736- hypermetabolic anterior right upper lobe mass, hypermetabolic soft tissue throughout the right pleural space, no evidence of distant metastatic disease  Cycle 1 Taxol/carboplatin 03/13/2018  CT chest 03/12/2018 increase in extensive tumor in the right chest with shift of the mediastinum to the left  CT chest 03/21/2018- large complex pleural effusion and pleural-based masses with near complete collapse of the right lung with shift of the mediastinum into the left chest, occlusion of the right mainstem bronchus, left lung airspace densities  Osimertinib started 03/21/2018  Chest radiation started  03/21/2018, completed 5/5 treatment 03/23/2018  2.  Respiratory failure secondary to #1-improved  3.  CVA 09/07/2016 in setting of a PFO  4.  PFO closure January 2018  5.  Hypertension  6.  Mild neutropenia and thrombocytopenia, likely secondary to Taxol/carboplatin given 03/13/2018   Ruben Eaton is significantly improved from a respiratory standpoint.  The peripheral edema continues to improve.  He has developed mild neutropenia and thrombocytopenia, likely secondary to Taxol/carboplatin chemotherapy.  He is now at day 15 following cycle 1 Taxol/carboplatin. He is stable for discharge from an oncology standpoint if arrangements can be made for home oxygen.  Recommendations:  1.  Continue daily osimertinib 2   follow-up as scheduled 03/30/2018 with Dr. Rogue Bussing at the [facility]  I am available to see him in the future as needed     LOS: 6 days   Betsy Coder, MD   03/27/2018, 7:19 AM

## 2018-03-27 NOTE — Consult Note (Signed)
   [facility] CM Inpatient Consult   03/27/2018  Ruben Eaton 26-Feb-1971 [id number]    Went to bedside to speak with Ruben Eaton and wife on behalf of [place] [employer] program for [employer] employees/dependents with [facility] insurance.   Ruben Eaton endorses he has spoken with [facility] on several occasions post hospital discharges.   Ruben Eaton endorses he is interested in having portable oxygen at home. [facility] Liaison reports she has already placed request for Ruben Eaton to be evaluated for home portable oxygen per patient request.   Discussed that he will receive post hospital discharge from [facility]. Provided contact information. Ruben Eaton reports he already has 24-hr nurse advice line magnet.   [name], MSN-Ed, RN,BSN [facility] Liaison [phone]

## 2018-03-28 ENCOUNTER — Ambulatory Visit: Payer: No Typology Code available for payment source

## 2018-03-28 ENCOUNTER — Other Ambulatory Visit: Payer: Self-pay | Admitting: *Deleted

## 2018-03-28 NOTE — Patient Outreach (Addendum)
[facility]) Care Management  03/28/2018  Ruben Eaton 1971/09/08 [id number]   Subjective: Telephone call to patient's home number, spoke with patient, and HIPAA verified.  Discussed [facility] Care Management Focus Plan Transition of care follow up, patient voiced understanding, and is in agreement to follow up. Patient states he remembers speaking with this RNCM in the past.   Patient states he is doing okay today, considering his new diagnosis, has an appointment for portable oxygen concentrator evaluation next week (does not know exact date), and has a follow up appointment with oncologist on 03/30/18.  Patient states he is able to manage self care and has assistance as needed. Patient voices understanding of medical diagnosis and treatment plan.  [facility] benefits discussed on 03/01/18 transition of care follow up call and patient states no additional questions at this time. Patient states he does not have any education material, transition of care, care coordination, disease management, disease monitoring, transportation, community resource, or pharmacy needs at this time.  States he is very appreciative of the follow up and is in agreement to receive [place] Management information.     Objective:Per KPN (Knowledge Performance Now, point of care tool) and chart review, patient hospitalized 03/21/18 -03/27/18 for Acute respiratory failure with hypoxia,  Anasarca, Non-small cell cancer of left lung, PVC's (premature ventricular contractions), and Primary cancer of right lower lobe of lung.    Patient hospitalized 02/21/18-02/27/18 forAcute right malignant pleural effusion, and status postpreoperative bronchoscopy to assess endobronchial anatomy,right thoracoscopy with talc pleurodesis,pleural biopsy,#3 insertion of Pleurx catheter #4 insertion of Port-A-Cath using ultrasound and vascular guidanceon 02/23/18. 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  [facility] Care Management transition of care follow up completed on 03/01/18.      Assessment: Received Focus Plan Transition of care referral on 03/22/18.Transition of care follow up completed, no care management needs, and will proceed with case closure.      Plan:RNCM will send patient successful outreach letter, [facility] pamphlet, and magnet. RNCM will complete case closure due to follow up completed / no care management needs.      Ruben Eaton, BSN, [place] Management [facility] Telephonic CM Phone: [phone] Fax: [phone]

## 2018-03-28 NOTE — Progress Notes (Signed)
  Radiation Oncology         [phone] ________________________________  Name: Ruben Eaton MRN: [id number]  Date: 03/23/2018  DOB: 31-Mar-1971  End of Treatment Note  Diagnosis:   47 y.o. gentleman with non-small cell ling cancer, status-post pleural biopsy 02/23/2018 confirming high-grade carcinoma of lung origin.      Indication for treatment:  Palliative        Radiation treatment dates:   03/21/18 - 03/23/18 Received two treatments on 03/22/18( 8:30am & 2:55pm)  and 03/23/18 (8:45am & 4:45pm)  Site/dose:   20 Gy directed to the right lung delivered in 5 fractions.  Beams/energy:   15X photons with 3 -D technique.  Narrative: The patient tolerated radiation treatment relatively well. Patient was evaluated in the wheelchair. He seemed to have a less swelling in his face and neck area after treatment complete. Patient reported feeling a little better after treatment. The right lung exam show markedly decreased breath sounds. He remained an Inpatient for all of his therapy.  Plan: The patient has completed radiation treatment. The patient will return to radiation oncology clinic for routine followup in one month. I advised them to call or return sooner if they have any questions or concerns related to their recovery or treatment.  -----------------------------------  Blair Promise, PhD, MD  This document serves as a record of services personally performed by Gery Pray MD. It was created on his behalf by Delton Coombes, a trained medical scribe. The creation of this record is based on the scribe's personal observations and the provider's statements to them.

## 2018-03-30 ENCOUNTER — Inpatient Hospital Stay: Payer: No Typology Code available for payment source | Attending: Internal Medicine | Admitting: Internal Medicine

## 2018-03-30 ENCOUNTER — Encounter: Payer: Self-pay | Admitting: Internal Medicine

## 2018-03-30 ENCOUNTER — Ambulatory Visit: Payer: No Typology Code available for payment source

## 2018-03-30 VITALS — BP 113/69 | HR 75 | Temp 96.7°F | Resp 16 | Wt 163.6 lb

## 2018-03-30 DIAGNOSIS — I10 Essential (primary) hypertension: Secondary | ICD-10-CM | POA: Insufficient documentation

## 2018-03-30 DIAGNOSIS — Z9981 Dependence on supplemental oxygen: Secondary | ICD-10-CM | POA: Diagnosis not present

## 2018-03-30 DIAGNOSIS — Z8673 Personal history of transient ischemic attack (TIA), and cerebral infarction without residual deficits: Secondary | ICD-10-CM | POA: Diagnosis not present

## 2018-03-30 DIAGNOSIS — G4733 Obstructive sleep apnea (adult) (pediatric): Secondary | ICD-10-CM | POA: Insufficient documentation

## 2018-03-30 DIAGNOSIS — R197 Diarrhea, unspecified: Secondary | ICD-10-CM | POA: Diagnosis not present

## 2018-03-30 DIAGNOSIS — Z7982 Long term (current) use of aspirin: Secondary | ICD-10-CM | POA: Insufficient documentation

## 2018-03-30 DIAGNOSIS — Z9221 Personal history of antineoplastic chemotherapy: Secondary | ICD-10-CM | POA: Diagnosis not present

## 2018-03-30 DIAGNOSIS — J910 Malignant pleural effusion: Secondary | ICD-10-CM | POA: Insufficient documentation

## 2018-03-30 DIAGNOSIS — Z87891 Personal history of nicotine dependence: Secondary | ICD-10-CM | POA: Insufficient documentation

## 2018-03-30 DIAGNOSIS — R600 Localized edema: Secondary | ICD-10-CM | POA: Diagnosis not present

## 2018-03-30 DIAGNOSIS — C3431 Malignant neoplasm of lower lobe, right bronchus or lung: Secondary | ICD-10-CM | POA: Insufficient documentation

## 2018-03-30 DIAGNOSIS — R634 Abnormal weight loss: Secondary | ICD-10-CM | POA: Insufficient documentation

## 2018-03-30 DIAGNOSIS — Z79899 Other long term (current) drug therapy: Secondary | ICD-10-CM | POA: Diagnosis not present

## 2018-03-30 DIAGNOSIS — R9389 Abnormal findings on diagnostic imaging of other specified body structures: Secondary | ICD-10-CM | POA: Diagnosis not present

## 2018-03-30 NOTE — Progress Notes (Signed)
[facility] OFFICE PROGRESS NOTE  Patient Care Team: Dion Body, MD as PCP - General (Family Medicine) [name], MD as PCP - Cardiology (Cardiology) Telford Nab, RN as Registered Nurse  Cancer Staging Primary cancer of right lower lobe of lung [facility]) Staging form: Lung, AJCC 8th Edition - Clinical stage from 03/02/2018: Stage IV (cT4, cN0, pM1a) - Unsigned    Oncology History   # June 2019- LUNG CA- HIGH GRADE CA with sarcomatoid features [EGFR MUTATED; exon 19; clinically non-small cell];II opinion [facility]  # June 17th- carbo-taxol #1; June 26th 2019- Osimertinib '80mg'$ /day; S/p palliative RT [facility]; June 28th, 2019]  # HTN/ cryptogenic stroke    DIAGNOSIS: non-small cell lung ca  STAGE:  IV       ;GOALS: palliative  CURRENT/MOST RECENT THERAPY- June 26th OSIMERTINIB       Primary cancer of right lower lobe of lung ([place])      INTERVAL HISTORY:  Ruben Eaton 47 y.o.  male pleasant patient above history of non-small cell lung cancer-EGFR mutant currently started on osimertinib approximately week ago is here for follow-up.  Patient since the last visit with me approximately 10 days ago-had progressive shortness of breath that showed worsening right leg malignancy with mediastinal shift.  Patient needed high oxygen flow by nasal cannula-given his respiratory failure.   Admitted to the ICU  at [facility] also received palliative radiation to his chest; and also started on osimertinib on the 26th. Patient did remarkably well  Is currently on 2 L states his appetite is improving.;   He continues to complain of bilateral leg swelling over those improving.  No nausea no vomiting.  1-2 loose stools a day.  No skin rash.  Review of Systems  Constitutional: Positive for weight loss. Negative for chills, diaphoresis, fever and malaise/fatigue.  HENT: Negative for nosebleeds and sore throat.   Eyes: Negative for double vision.  Respiratory:  Positive for sputum production. Negative for cough, hemoptysis, shortness of breath and wheezing.   Cardiovascular: Positive for leg swelling. Negative for chest pain, palpitations and orthopnea.  Gastrointestinal: Positive for diarrhea. Negative for abdominal pain, blood in stool, constipation, heartburn, melena, nausea and vomiting.  Genitourinary: Negative for dysuria, frequency and urgency.  Musculoskeletal: Negative for back pain and joint pain.  Skin: Negative.  Negative for itching and rash.  Neurological: Negative for dizziness, tingling, focal weakness, weakness and headaches.  Endo/Heme/Allergies: Does not bruise/bleed easily.  Psychiatric/Behavioral: Negative for depression. The patient is not nervous/anxious and does not have insomnia.       PAST MEDICAL HISTORY :  Past Medical History:  Diagnosis Date  . Cryptogenic stroke ([place]) 08/2016   a. 09/15/2016 in setting of PFO.  . CVA (cerebral vascular accident) ([place]) 09/07/2016  . ED (erectile dysfunction)   . Essential (primary) hypertension 02/01/2016  . History of stroke 10/08/2016  . Hx of insomnia   . Hypertension   . OSA on CPAP   . PFO (patent foramen ovale)    a. s/p closure 09/2016 by Dr. [name].  . Pleural effusion on right 02/21/2018  . Primary cancer of right lower lobe of lung ([place]) 02/25/2018  . PVC's (premature ventricular contractions) 10/08/2016  . 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 TIA (transient ischemic attack) 09/07/2016    PAST SURGICAL HISTORY :   Past Surgical History:  Procedure Laterality Date  . APPENDECTOMY  1980  . EYE SURGERY    . PATENT FORAMEN  OVALE CLOSURE  10/07/2016  . PATENT FORAMEN OVALE CLOSURE N/A 10/07/2016   Procedure: Patent [place](PFO) Closure;  Surgeon: [name], MD;  Location: [facility];  Service: Cardiovascular;  Laterality: N/A;  . PORTACATH PLACEMENT N/A 02/23/2018   Procedure: INSERTION PORT-A-CATH;  Surgeon: Nestor Lewandowsky, MD;   Location: [facility] ORS;  Service: Thoracic;  Laterality: N/A;  . STRABISMUS SURGERY Left 1984  . TEE WITHOUT CARDIOVERSION N/A 09/08/2016   Procedure: TRANSESOPHAGEAL ECHOCARDIOGRAM (TEE);  Surgeon: [name], MD;  Location: [facility] ORS;  Service: Cardiovascular;  Laterality: N/A;  . VIDEO ASSISTED THORACOSCOPY (VATS) W/TALC PLEUADESIS Right 02/23/2018   Procedure: VIDEO ASSISTED THORACOSCOPY (VATS) W/TALC PLEUADESIS;  Surgeon: Nestor Lewandowsky, MD;  Location: [facility] ORS;  Service: Thoracic;  Laterality: Right;  . VITRECTOMY Right 2013    FAMILY HISTORY :   Family History  Problem Relation Age of Onset  . Other Paternal Uncle        Abdominal tumor     SOCIAL HISTORY:   Social History   Tobacco Use  . Smoking status: Former Smoker    Packs/day: 0.50    Years: 10.00    Pack years: 5.00    Last attempt to quit: 2011    Years since quitting: 8.5  . Smokeless tobacco: Former Systems developer    Types: Snuff  Substance Use Topics  . Alcohol use: Yes    Alcohol/week: 1.8 oz    Types: 3 Cans of beer per week  . Drug use: No    ALLERGIES:  has No Known Allergies.  MEDICATIONS:  Current Outpatient Medications  Medication Sig Dispense Refill  . acetaminophen (TYLENOL) 325 MG tablet Take 2 tablets (650 mg total) by mouth every 6 (six) hours as needed for mild pain (or Fever >/= 101).    . Amino Acids-Protein Hydrolys (FEEDING SUPPLEMENT, PRO-STAT SUGAR FREE 64,) LIQD Take 30 mLs by mouth 2 (two) times daily. 900 mL 0  . aspirin EC 81 MG EC tablet Take 1 tablet (81 mg total) by mouth daily. 120 tablet 0  . B Complex-C (B-COMPLEX WITH VITAMIN C) tablet Take 1 tablet by mouth daily.    Marland Kitchen dexamethasone (DECADRON) 4 MG tablet Take 2 tablets (8 mg total) by mouth daily. Start the day after chemotherapy for 2 days. 30 tablet 1  . dextromethorphan-guaiFENesin (MUCINEX DM) 30-600 MG 12hr tablet Take 1 tablet by mouth 2 (two) times daily. 60 tablet 0  . Diclofenac Sodium 2 % SOLN Apply 1 pump twice daily as  needed. (Patient taking differently: Apply 1 application topically 2 (two) times daily as needed (pain). Apply 1 pump twice daily as needed.) 112 g 3  . loratadine (CLARITIN) 10 MG tablet Take 10 mg by mouth daily as needed for allergies.     Marland Kitchen LORazepam (ATIVAN) 0.5 MG tablet Take 1 tablet (0.5 mg total) by mouth every 8 (eight) hours as needed for anxiety. 30 tablet 0  . metoprolol tartrate (LOPRESSOR) 25 MG tablet Take 1 tablet (25 mg total) by mouth 2 (two) times daily. 60 tablet 2  . Morphine Sulfate (MORPHINE CONCENTRATE) 10 mg / 0.5 ml concentrated solution Take 1 mL (20 mg total) by mouth every 4 (four) hours as needed for shortness of breath. (Patient taking differently: Take 5-10 mg by mouth every 4 (four) hours as needed for shortness of breath. ) 118 mL 0  . Multiple Vitamin (MULTIVITAMIN WITH MINERALS) TABS tablet Take 1 tablet by mouth daily.    . ondansetron (ZOFRAN)  8 MG tablet Take 1 tablet (8 mg total) by mouth 2 (two) times daily as needed for refractory nausea / vomiting. 30 tablet 1  . osimertinib mesylate (TAGRISSO) 80 MG tablet Take 1 tablet (80 mg total) by mouth daily. 30 tablet 5  . polyethylene glycol (MIRALAX / GLYCOLAX) packet Take 17 g by mouth daily as needed. 14 each 0  . prochlorperazine (COMPAZINE) 10 MG tablet Take 1 tablet (10 mg total) by mouth every 6 (six) hours as needed (Nausea or vomiting). 30 tablet 1  . tadalafil (CIALIS) 5 MG tablet Take 5 mg by mouth daily as needed for erectile dysfunction.    . triamcinolone (NASACORT) 55 MCG/ACT AERO nasal inhaler Place 2 sprays into the nose daily as needed (congestion).     . Vitamin D, Ergocalciferol, (DRISDOL) 50000 units CAPS capsule Take 1 capsule (50,000 Units total) by mouth every 7 (seven) days. (Patient taking differently: Take 50,000 Units by mouth every 7 (seven) days. Mondays) 12 capsule 0  . zolpidem (AMBIEN) 10 MG tablet Take 10 mg by mouth at bedtime as needed for sleep.     Marland Kitchen atorvastatin (LIPITOR) 40 MG  tablet Take 1 tablet (40 mg total) by mouth daily at 6 PM. (Patient not taking: Reported on 03/13/2018) 30 tablet 0   No current facility-administered medications for this visit.     PHYSICAL EXAMINATION: ECOG PERFORMANCE STATUS: 1 - Symptomatic but completely ambulatory  BP 113/69 (BP Location: Left Arm, Patient Position: Sitting)   Pulse 75   Temp (!) 96.7 F (35.9 C) (Tympanic)   Resp 16   Wt 163 lb 9.6 oz (74.2 kg)   BMI 26.41 kg/m   Filed Weights   03/30/18 1138  Weight: 163 lb 9.6 oz (74.2 kg)    GENERAL: Well-nourished well-developed; Alert, no distress and comfortable.  2 L of oxygen.  Accompanied by family.  EYES: no pallor or icterus OROPHARYNX: no thrush or ulceration; NECK: supple; no lymph nodes felt. LYMPH:  no palpable lymphadenopathy in the axillary or inguinal regions LUNGS: Decreased breath sounds auscultation on right side. No wheeze or crackles HEART/CVS: regular rate & rhythm and no murmurs; No lower extremity edema; pleurex catheter in place.  ABDOMEN:abdomen soft, non-tender and normal bowel sounds. No hepatomegaly or splenomegaly.  Musculoskeletal:no cyanosis of digits and no clubbing  PSYCH: alert & oriented x 3 with fluent speech NEURO: no focal motor/sensory deficits SKIN:  no rashes or significant lesions    LABORATORY DATA:  I have reviewed the data as listed    Component Value Date/Time   NA 141 03/25/2018 0729   NA 141 10/05/2016 0848   K 3.5 03/25/2018 0729   K 4.3 05/15/2013 0612   CL 103 03/25/2018 0729   CO2 31 03/25/2018 0729   GLUCOSE 108 (H) 03/25/2018 0729   BUN 25 (H) 03/25/2018 0729   BUN 17 10/05/2016 0848   CREATININE 1.18 03/25/2018 0729   CALCIUM 9.9 03/25/2018 0729   PROT 4.8 (L) 03/23/2018 0620   ALBUMIN 2.4 (L) 03/23/2018 0620   AST 24 03/23/2018 0620   ALT 32 03/23/2018 0620   ALKPHOS 40 03/23/2018 0620   BILITOT 0.6 03/23/2018 0620   GFRNONAA >60 03/25/2018 0729   GFRAA >60 03/25/2018 0729    No results  found for: SPEP, UPEP  Lab Results  Component Value Date   WBC 2.6 (L) 03/27/2018   NEUTROABS 1.1 (L) 03/27/2018   HGB 10.7 (L) 03/27/2018   HCT 32.3 (L) 03/27/2018  MCV 90.7 03/27/2018   PLT 132 (L) 03/27/2018      Chemistry      Component Value Date/Time   NA 141 03/25/2018 0729   NA 141 10/05/2016 0848   K 3.5 03/25/2018 0729   K 4.3 05/15/2013 0612   CL 103 03/25/2018 0729   CO2 31 03/25/2018 0729   BUN 25 (H) 03/25/2018 0729   BUN 17 10/05/2016 0848   CREATININE 1.18 03/25/2018 0729      Component Value Date/Time   CALCIUM 9.9 03/25/2018 0729   ALKPHOS 40 03/23/2018 0620   AST 24 03/23/2018 0620   ALT 32 03/23/2018 0620   BILITOT 0.6 03/23/2018 0620       RADIOGRAPHIC STUDIES: I have personally reviewed the radiological images as listed and agreed with the findings in the report. No results found.   ASSESSMENT & PLAN:  Primary cancer of right lower lobe of lung ([place]) #Non-small cell lung cancer/stage IV-EGFR mutated exon 19-currently on osimertinib since June 26th; patient also received palliative radiation to the rapidly growing right pleural metastases.   #Significant clinical improvement noted with improvement of his oxygen requirement. [Off oxygen; walking-saturations 94%]  # Patient seems to be tolerating therapy very well except for mild diarrhea.  Continue with him at this time.  #Diarrhea grade 1-recommend Imodium as needed   #Pleurx catheter draining about 10cc-continue monitoring for now.   #Pain well controlled-continue current pain medication.  #Patient was to go back to work-which I think is reasonable if he feels up to.  # follow up in 2 weeks/ labs.    Orders Placed This Encounter  Procedures  . CBC with Differential/Platelet    Standing Status:   Future    Standing Expiration Date:   03/31/2019  . Comprehensive metabolic panel    Standing Status:   Future    Standing Expiration Date:   03/31/2019   All questions were answered. The  patient knows to call the clinic with any problems, questions or concerns.      Cammie Sickle, MD 03/31/2018 9:33 AM

## 2018-03-30 NOTE — Assessment & Plan Note (Addendum)
#  Non-small cell lung cancer/stage IV-EGFR mutated exon 19-currently on osimertinib since June 26th; patient also received palliative radiation to the rapidly growing right pleural metastases.   #Significant clinical improvement noted with improvement of his oxygen requirement. [Off oxygen; walking-saturations 94%]  # Patient seems to be tolerating therapy very well except for mild diarrhea.  Continue with him at this time.  #Diarrhea grade 1-recommend Imodium as   #  bilateral lower extremity swelling likely second dependent edema; discussed regarding ultrasound Dopplers ordered to follow-up.  Continue compression stockings leg elevation increase protein intake.   #Pleurx catheter draining about 10cc-continue monitoring for now.   #Pain well controlled-continue current pain medication.  #Patient was to go back to work-which I think is reasonable if he feels up to.  # follow up in 2 weeks/ labs.

## 2018-04-02 ENCOUNTER — Ambulatory Visit: Payer: No Typology Code available for payment source

## 2018-04-03 ENCOUNTER — Ambulatory Visit: Payer: No Typology Code available for payment source

## 2018-04-03 ENCOUNTER — Other Ambulatory Visit: Payer: Self-pay | Admitting: Oncology

## 2018-04-04 ENCOUNTER — Ambulatory Visit: Payer: Self-pay

## 2018-04-04 ENCOUNTER — Ambulatory Visit: Payer: No Typology Code available for payment source

## 2018-04-04 ENCOUNTER — Other Ambulatory Visit: Payer: Self-pay

## 2018-04-04 ENCOUNTER — Ambulatory Visit: Payer: Self-pay | Admitting: Internal Medicine

## 2018-04-04 MED ORDER — LORAZEPAM 0.5 MG PO TABS: 0.5000 mg | ORAL_TABLET | Freq: Three times a day (TID) | ORAL | 0 refills | Status: DC | PRN

## 2018-04-04 NOTE — Telephone Encounter (Signed)
This is Dr Aletha Halim patient

## 2018-04-05 ENCOUNTER — Ambulatory Visit: Payer: No Typology Code available for payment source

## 2018-04-06 ENCOUNTER — Ambulatory Visit: Payer: No Typology Code available for payment source

## 2018-04-09 ENCOUNTER — Ambulatory Visit: Payer: No Typology Code available for payment source

## 2018-04-09 LAB — ACID FAST CULTURE WITH REFLEXED SENSITIVITIES (MYCOBACTERIA)

## 2018-04-09 LAB — ACID FAST CULTURE WITH REFLEXED SENSITIVITIES: ACID FAST CULTURE - AFSCU3: NEGATIVE

## 2018-04-10 ENCOUNTER — Ambulatory Visit: Payer: No Typology Code available for payment source

## 2018-04-11 ENCOUNTER — Ambulatory Visit: Payer: No Typology Code available for payment source

## 2018-04-12 ENCOUNTER — Encounter: Payer: Self-pay | Admitting: Internal Medicine

## 2018-04-12 ENCOUNTER — Ambulatory Visit: Payer: No Typology Code available for payment source

## 2018-04-12 ENCOUNTER — Ambulatory Visit (INDEPENDENT_AMBULATORY_CARE_PROVIDER_SITE_OTHER): Payer: No Typology Code available for payment source | Admitting: Internal Medicine

## 2018-04-12 VITALS — BP 96/50 | HR 83 | Ht 66.0 in | Wt 146.2 lb

## 2018-04-12 DIAGNOSIS — R0600 Dyspnea, unspecified: Secondary | ICD-10-CM

## 2018-04-12 DIAGNOSIS — I493 Ventricular premature depolarization: Secondary | ICD-10-CM | POA: Diagnosis not present

## 2018-04-12 DIAGNOSIS — I10 Essential (primary) hypertension: Secondary | ICD-10-CM | POA: Diagnosis not present

## 2018-04-12 NOTE — Progress Notes (Signed)
ELECTROPHYSIOLOGY CONSULT NOTE  Patient ID: Ruben Eaton, MRN: [id number], DOB/AGE: [birth year] 47 y.o. Admit date: (Not on file) Date of Consult: 04/12/2018  Primary Physician: Dion Body, MD Primary Cardiologist: Edd Fabian     Ruben Eaton is a 47 y.o. male who is being seen today for the evaluation of PVC at the request of [place]   HPI Ruben Eaton is a 47 y.o. male because of PVCs identified during ongoing treatment for recently diagnosed non-small cell lung cancer.  He was referred for Holter monitoring that demonstrated 34% monomorphic PVCs.  Echocardiogram around that time (6/19) demonstrated normal LV function.  These have been associated with palpitations.  It is his impression that these are markedly diminished.  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  This is much improved following radiation therapy.  Coincidental with this is a diminishing his PVC burden  He has a history of cryptogenic stroke and underwent closure of the PFO.  Past Medical History:  Diagnosis Date  . Cryptogenic stroke ([place]) 08/2016   a. 09/15/2016 in setting of PFO.  . CVA (cerebral vascular accident) ([place]) 09/07/2016  . ED (erectile dysfunction)   . Essential (primary) hypertension 02/01/2016  . History of stroke 10/08/2016  . Hx of insomnia   . Hypertension   . OSA on CPAP   . PFO (patent foramen ovale)    a. s/p closure 09/2016 by Dr. [name].  . Pleural effusion on right 02/21/2018  . Primary cancer of right lower lobe of lung ([place]) 02/25/2018  . PVC's (premature ventricular contractions) 10/08/2016  . 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 TIA (transient ischemic attack) 09/07/2016      Surgical History:  Past Surgical History:  Procedure Laterality Date  . APPENDECTOMY  1980  . EYE SURGERY    . PATENT FORAMEN  OVALE CLOSURE  10/07/2016  . PATENT FORAMEN OVALE CLOSURE N/A 10/07/2016   Procedure: Patent Forament [place](PFO) Closure;  Surgeon: [name], MD;  Location: [place] [facility];  Service: Cardiovascular;  Laterality: N/A;  . PORTACATH PLACEMENT N/A 02/23/2018   Procedure: INSERTION PORT-A-CATH;  Surgeon: Nestor Lewandowsky, MD;  Location: [facility];  Service: Thoracic;  Laterality: N/A;  . STRABISMUS SURGERY Left 1984  . TEE WITHOUT CARDIOVERSION N/A 09/08/2016   Procedure: TRANSESOPHAGEAL ECHOCARDIOGRAM (TEE);  Surgeon: [name], MD;  Location: [facility];  Service: Cardiovascular;  Laterality: N/A;  . VIDEO ASSISTED THORACOSCOPY (VATS) W/TALC PLEUADESIS Right 02/23/2018   Procedure: VIDEO ASSISTED THORACOSCOPY (VATS) W/TALC PLEUADESIS;  Surgeon: Nestor Lewandowsky, MD;  Location: [facility];  Service: Thoracic;  Laterality: Right;  . VITRECTOMY Right 2013     Home Meds: Prior to Admission medications   Medication Sig Start Date End Date Taking? Authorizing Provider  acetaminophen (TYLENOL) 325 MG tablet Take 2 tablets (650 mg total) by mouth every 6 (six) hours as needed for mild pain (or Fever >/= 101). 03/27/18   Debbe Odea, MD  Amino Acids-Protein Hydrolys (FEEDING SUPPLEMENT, PRO-STAT SUGAR FREE 64,) LIQD Take 30 mLs by mouth 2 (two) times daily. 03/27/18   Debbe Odea, MD  aspirin EC 81 MG EC tablet Take 1 tablet (81 mg total) by mouth daily. 09/08/16   Bettey Costa, MD  atorvastatin (LIPITOR) 40 MG tablet Take 1 tablet (40 mg total) by mouth daily at 6 PM. Patient not taking:  Reported on 03/13/2018 09/08/16   Bettey Costa, MD  B Complex-C (B-COMPLEX WITH VITAMIN C) tablet Take 1 tablet by mouth daily.    [provider]  dexamethasone (DECADRON) 4 MG tablet Take 2 tablets (8 mg total) by mouth daily. Start the day after chemotherapy for 2 days. 03/12/18   Earlie Server, MD  dextromethorphan-guaiFENesin [facility] DM) 30-600 MG 12hr tablet Take 1 tablet by mouth 2 (two) times daily. 03/27/18    Debbe Odea, MD  Diclofenac Sodium 2 % SOLN Apply 1 pump twice daily as needed. Patient taking differently: Apply 1 application topically 2 (two) times daily as needed (pain). Apply 1 pump twice daily as needed. 02/01/16   Lyndal Pulley, DO  loratadine (CLARITIN) 10 MG tablet Take 10 mg by mouth daily as needed for allergies.     [provider]  LORazepam (ATIVAN) 0.5 MG tablet Take 1 tablet (0.5 mg total) by mouth every 8 (eight) hours as needed for anxiety. 04/04/18   Cammie Sickle, MD  metoprolol tartrate (LOPRESSOR) 25 MG tablet Take 1 tablet (25 mg total) by mouth 2 (two) times daily. 02/27/18   Dustin Flock, MD  Morphine Sulfate (MORPHINE CONCENTRATE) 10 mg / 0.5 ml concentrated solution Take 1 mL (20 mg total) by mouth every 4 (four) hours as needed for shortness of breath. Patient taking differently: Take 5-10 mg by mouth every 4 (four) hours as needed for shortness of breath.  03/12/18   Earlie Server, MD  Multiple Vitamin (MULTIVITAMIN WITH MINERALS) TABS tablet Take 1 tablet by mouth daily.    [provider]  ondansetron (ZOFRAN) 8 MG tablet Take 1 tablet (8 mg total) by mouth 2 (two) times daily as needed for refractory nausea / vomiting. 03/12/18   Earlie Server, MD  osimertinib mesylate (TAGRISSO) 80 MG tablet Take 1 tablet (80 mg total) by mouth daily. 03/19/18   Cammie Sickle, MD  polyethylene glycol (MIRALAX / GLYCOLAX) packet Take 17 g by mouth daily as needed. 03/27/18   Debbe Odea, MD  prochlorperazine (COMPAZINE) 10 MG tablet Take 1 tablet (10 mg total) by mouth every 6 (six) hours as needed (Nausea or vomiting). 03/13/18   Earlie Server, MD  tadalafil (CIALIS) 5 MG tablet Take 5 mg by mouth daily as needed for erectile dysfunction.    [provider]  triamcinolone (NASACORT) 55 MCG/ACT AERO nasal inhaler Place 2 sprays into the nose daily as needed (congestion).     [provider]  Vitamin D, Ergocalciferol, (DRISDOL) 50000 units CAPS  capsule Take 1 capsule (50,000 Units total) by mouth every 7 (seven) days. Patient taking differently: Take 50,000 Units by mouth every 7 (seven) days. Mondays 05/05/16   Lyndal Pulley, DO  zolpidem (AMBIEN) 10 MG tablet Take 10 mg by mouth at bedtime as needed for sleep.  12/10/15   [provider]    Allergies: No Known Allergies  Social History   Socioeconomic History  . Marital status: Married    Spouse name: [name]   . Number of children: Not on file  . Years of education: Not on file  . Highest education level: Not on file  Occupational History  . Occupation: Software engineer  Social Needs  . Financial resource strain: Not on file  . Food insecurity:    Worry: Not on file    Inability: Not on file  . Transportation needs:    Medical: Not on file    Non-medical: Not on file  Tobacco Use  .  Smoking status: Former Smoker    Packs/day: 0.50    Years: 10.00    Pack years: 5.00    Last attempt to quit: 2011    Years since quitting: 8.5  . Smokeless tobacco: Former Systems developer    Types: Snuff  Substance and Sexual Activity  . Alcohol use: Yes    Alcohol/week: 1.8 oz    Types: 3 Cans of beer per week  . Drug use: No  . Sexual activity: Yes  Lifestyle  . Physical activity:    Days per week: Not on file    Minutes per session: Not on file  . Stress: Not on file  Relationships  . Social connections:    Talks on phone: Not on file    Gets together: Not on file    Attends religious service: Not on file    Active member of club or organization: Not on file    Attends meetings of clubs or organizations: Not on file    Relationship status: Not on file  . Intimate partner violence:    Fear of current or ex partner: Not on file    Emotionally abused: Not on file    Physically abused: Not on file    Forced sexual activity: Not on file  Other Topics Concern  . Not on file  Social History Narrative  . Not on file     Family History  Problem Relation Age of Onset  .  Other Paternal Uncle        Abdominal tumor      ROS:  Please see the history of present illness.     All other systems reviewed and negative.    Physical Exam:  Blood pressure (!) 96/50, pulse 83, height 5\' 6"  (1.676 m), weight 146 lb 4 oz (66.3 kg). General: Well developed, well nourished male in no acute distress. Head: Normocephalic, atraumatic, sclera non-icteric, no xanthomas, nares are without discharge. EENT: normal  Lymph Nodes:  none Neck: Negative for carotid bruits. JVD not elevated. Back:without scoliosis kyphosis  Lungs: Decreased breath sounds right chest Heart: RRR with S1 S2.  2/6 systolic murmur . No rubs, or gallops appreciated. Abdomen: Soft, non-tender, non-distended with normoactive bowel sounds. No hepatomegaly. No rebound/guarding. No obvious abdominal masses. Msk:  Strength and tone appear normal for age. Extremities: No clubbing or cyanosis. No edema.  Distal pedal pulses are 2+ and equal bilaterally. Skin: Warm and Dry Neuro: Alert and oriented X 3. CN III-XII intact Grossly normal sensory and motor function . Psych:  Responds to questions appropriately with a normal affect.      Labs: Cardiac Enzymes No results for input(s): CKTOTAL, CKMB, TROPONINI in the last 72 hours. CBC Lab Results  Component Value Date   WBC 2.6 (L) 03/27/2018   HGB 10.7 (L) 03/27/2018   HCT 32.3 (L) 03/27/2018   MCV 90.7 03/27/2018   PLT 132 (L) 03/27/2018   PROTIME: No results for input(s): LABPROT, INR in the last 72 hours. Chemistry No results for input(s): NA, K, CL, CO2, BUN, CREATININE, CALCIUM, PROT, BILITOT, ALKPHOS, ALT, AST, GLUCOSE in the last 168 hours.  Invalid input(s): LABALBU Lipids Lab Results  Component Value Date   CHOL 228 (H) 09/07/2016   HDL 108 09/07/2016   LDLCALC 111 (H) 09/07/2016   TRIG 44 09/07/2016   BNP No results found for: PROBNP Thyroid Function Tests: No results for input(s): TSH, T4TOTAL, T3FREE, THYROIDAB in the last 72  hours.  Invalid input(s): FREET3 Miscellaneous No  results found for: DDIMER  Radiology/Studies:  Ct Angio Chest Pe W Or Wo Contrast  Result Date: 03/21/2018 CLINICAL DATA:  47 year old male with shortness of breath. Patient receiving chemotherapy for right lower lobe lung cancer. EXAM: CT ANGIOGRAPHY CHEST WITH CONTRAST TECHNIQUE: Multidetector CT imaging of the chest was performed using the standard protocol during bolus administration of intravenous contrast. Multiplanar CT image reconstructions and MIPs were obtained to evaluate the vascular anatomy. CONTRAST:  42mL ISOVUE-370 IOPAMIDOL (ISOVUE-370) INJECTION 76% COMPARISON:  None. FINDINGS: Cardiovascular: There is no cardiomegaly or pericardial effusion. An Amplatzer occlusive device of patent foramen ovale. The thoracic aorta is unremarkable. The origins of the great vessels of the aortic arch are patent. Evaluation of the pulmonary arteries is limited due to suboptimal enhancement of the peripheral branches. No large or central pulmonary artery embolus identified. Mediastinum/Nodes: There is no adenopathy in the region of the left hilum. Evaluation of the right hilum is limited due to consolidative changes of the right lung. There is shift of the mediastinum into the left hemithorax. Mildly enlarged anterior mediastinal lymph nodes measure up to 15 mm in short axis. A Port-A-Cath is noted with tip in the region of the cavoatrial junction. Lungs/Pleura: There is a large somewhat loculated and complex right pleural effusion and pleural based masses. There is compressive atelectasis and collapse of the right lung. There is only a small aerated portion of the right upper lobe. There are ill-defined hypodense masses in the right chest involving the right lung or pleura which are suboptimally visualized and poorly evaluated. There is mass effect and shift of the mediastinum into the left hemithorax. There is extension of fluid or pleural masses posterior  to the mediastinum. A right-sided pleural base tube is seen with tip in the right apical region. There is mass effect and narrowing of the right upper lobe pulmonary artery and high-grade narrowing and complete occlusion of the right mainstem bronchus. Diffuse confluent ground-glass nodular densities in the left lung primarily involving the left lower lobe and lingula most consistent with pneumonia although metastatic disease is not excluded. There is no pneumothorax. Upper Abdomen: No acute abnormality. Musculoskeletal: No chest wall abnormality. No acute or significant osseous findings. Review of the MIP images confirms the above findings. IMPRESSION: 1. No CT evidence of pulmonary embolism. 2. Large complex pleural effusion and pleural based masses with near complete collapse of the right lung and associated mass effect and shift of the mediastinum into the left hemithorax. There is occlusion of the right mainstem bronchus secondary to mass effect and extrinsic compression. 3. Left lower lobe and lingula airspace densities most consistent with pneumonia. Clinical correlation recommended. Electronically Signed   By: Anner Crete M.D.   On: 03/21/2018 04:46   Dg Chest Port 1 View  Result Date: 03/23/2018 CLINICAL DATA:  47 year old male with acute respiratory failure. History of VATS on 02/23/2018 and right lower lobe cancer. EXAM: PORTABLE CHEST 1 VIEW COMPARISON:  None. FINDINGS: Right-sided Port-A-Cath with tip over the mediastinum. There is a large pleural effusion with near complete consolidative changes of the right lung. There is only a small aerated portion of the right lung in the upper lobe. Left lung base densities may represent atelectasis or infiltrate. A small left pleural effusion may be present. There is no pneumothorax. There is silhouetting of the right cardiac border. No acute osseous pathology. IMPRESSION: Near complete opacification of the right hemithorax secondary to large right  pleural effusion and associated right lung compressive atelectasis or  consolidation. Left lung base atelectasis versus infiltrate. Electronically Signed   By: Anner Crete M.D.   On: 03/23/2018 05:04   Dg Chest Port 1 View  Result Date: 03/20/2018 CLINICAL DATA:  Recently diagnosed right upper lobe lung cancer with malignant right pleural effusion status post right VATS with talc pleurodesis. Worsening dyspnea EXAM: PORTABLE CHEST 1 VIEW COMPARISON:  03/12/2018 chest radiograph. FINDINGS: Right internal jugular MediPort terminates at the cavoatrial junction. Right apical chest tube is in place. Worsening near complete opacification of the right hemithorax with minimal residual aeration in the right upper lung. Worsening marked circumferential right pleural thickening/effusion. Worsened left mediastinal shift. Normal heart size. Stable mediastinal contour. No pneumothorax. No left pleural effusion. Increased patchy left retrocardiac opacity. IMPRESSION: 1. Worsening near complete opacification of the right hemithorax with minimal residual aeration in the right upper lung and worsening diffuse marked right pleural thickening/effusion. 2. Worsening left mediastinal shift with increased patchy left retrocardiac opacity, probably atelectasis. Electronically Signed   By: Ilona Sorrel M.D.   On: 03/20/2018 21:19   Ir US Chest  Result Date: 03/21/2018 CLINICAL DATA:  47 year old male with mediastinal shift and SVC syndrome EXAM: CHEST ULTRASOUND COMPARISON:  CT 03/21/2018, 03/01/2018, 02/21/2018 FINDINGS: Limited ultrasound images were performed in order to locate potential fluid for aspiration/drainage. Ultrasound survey of the anterior and posterior chest demonstrates tumor tissue occupying space around the lungs. Drainage deferred at this time. IMPRESSION: Limited ultrasound images of the chest demonstrate no localized fluid collection for drainage. Drainage deferred at this time. Electronically Signed   By:  Corrie Mckusick D.O.   On: 03/21/2018 16:57    EKG: Sinus rhythm at 83 Interval 16/08/37 Isolated PVC with a left bundle inferior axis morphology with a QR complex in lead II   Assessment and Plan:  PVCs  Non-small cell lung cancer  SVC syndrome with right parasternal mass  Hypotension-relative   The patient's PVCs are diminished in count symptoms.  Indication for therapy would be persistent debilitating symptoms and/or impact on left ventricular function.  EF early June was nearly normal.  We will plan to repeat the echocardiogram in 3 months.  I reviewed with him and his wife how to take the pulse and estimate PVC burden.  Blood pressure is relatively low but has been low before.  It certainly can be evidence of important physiology i.e. venous compression and or pericardial effusion; neck veins were flat today.  We will see again in 3 months.    Virl Axe

## 2018-04-12 NOTE — Patient Instructions (Signed)
Medication Instructions:  Your physician recommends that you continue on your current medications as directed. Please refer to the Current Medication list given to you today.   Labwork: none  Testing/Procedures: Your physician has requested that you have an echocardiogram. Echocardiography is a painless test that uses sound waves to create images of your heart. It provides your doctor with information about the size and shape of your heart and how well your heart's chambers and valves are working. This procedure takes approximately one hour. There are no restrictions for this procedure. You may get an IV, if needed, to receive an ultrasound enhancing agent through to better visualize your heart.    Follow-Up: Your physician recommends that you schedule a follow-up appointment in: as needed.   If you need a refill on your cardiac medications before your next appointment, please call your pharmacy.   Echocardiogram An echocardiogram, or echocardiography, uses sound waves (ultrasound) to produce an image of your heart. The echocardiogram is simple, painless, obtained within a short period of time, and offers valuable information to your health care provider. The images from an echocardiogram can provide information such as:  Evidence of coronary artery disease (CAD).  Heart size.  Heart muscle function.  Heart valve function.  Aneurysm detection.  Evidence of a past heart attack.  Fluid buildup around the heart.  Heart muscle thickening.  Assess heart valve function.  Tell a health care provider about:  Any allergies you have.  All medicines you are taking, including vitamins, herbs, eye drops, creams, and over-the-counter medicines.  Any problems you or family members have had with anesthetic medicines.  Any blood disorders you have.  Any surgeries you have had.  Any medical conditions you have.  Whether you are pregnant or may be pregnant. What happens before the  procedure? No special preparation is needed. Eat and drink normally. What happens during the procedure?  In order to produce an image of your heart, gel will be applied to your chest and a wand-like tool (transducer) will be moved over your chest. The gel will help transmit the sound waves from the transducer. The sound waves will harmlessly bounce off your heart to allow the heart images to be captured in real-time motion. These images will then be recorded.  You may need an IV to receive a medicine that improves the quality of the pictures. What happens after the procedure? You may return to your normal schedule including diet, activities, and medicines, unless your health care provider tells you otherwise. This information is not intended to replace advice given to you by your health care provider. Make sure you discuss any questions you have with your health care provider. Document Released: 09/09/2000 Document Revised: 04/30/2016 Document Reviewed: 05/20/2013 Elsevier Interactive Patient Education  2017 [employer].

## 2018-04-13 ENCOUNTER — Inpatient Hospital Stay: Payer: No Typology Code available for payment source

## 2018-04-13 ENCOUNTER — Other Ambulatory Visit: Payer: Self-pay

## 2018-04-13 ENCOUNTER — Ambulatory Visit: Payer: No Typology Code available for payment source

## 2018-04-13 ENCOUNTER — Inpatient Hospital Stay (HOSPITAL_BASED_OUTPATIENT_CLINIC_OR_DEPARTMENT_OTHER): Payer: No Typology Code available for payment source | Admitting: Internal Medicine

## 2018-04-13 VITALS — BP 95/62 | HR 80 | Temp 97.9°F | Resp 20 | Ht 66.0 in | Wt 148.0 lb

## 2018-04-13 DIAGNOSIS — Z9981 Dependence on supplemental oxygen: Secondary | ICD-10-CM

## 2018-04-13 DIAGNOSIS — J910 Malignant pleural effusion: Secondary | ICD-10-CM | POA: Diagnosis not present

## 2018-04-13 DIAGNOSIS — I10 Essential (primary) hypertension: Secondary | ICD-10-CM

## 2018-04-13 DIAGNOSIS — Z7189 Other specified counseling: Secondary | ICD-10-CM

## 2018-04-13 DIAGNOSIS — Z9221 Personal history of antineoplastic chemotherapy: Secondary | ICD-10-CM | POA: Diagnosis not present

## 2018-04-13 DIAGNOSIS — C3431 Malignant neoplasm of lower lobe, right bronchus or lung: Secondary | ICD-10-CM

## 2018-04-13 DIAGNOSIS — Z8673 Personal history of transient ischemic attack (TIA), and cerebral infarction without residual deficits: Secondary | ICD-10-CM

## 2018-04-13 DIAGNOSIS — R600 Localized edema: Secondary | ICD-10-CM

## 2018-04-13 DIAGNOSIS — R9389 Abnormal findings on diagnostic imaging of other specified body structures: Secondary | ICD-10-CM

## 2018-04-13 DIAGNOSIS — Z79899 Other long term (current) drug therapy: Secondary | ICD-10-CM

## 2018-04-13 DIAGNOSIS — G4733 Obstructive sleep apnea (adult) (pediatric): Secondary | ICD-10-CM

## 2018-04-13 DIAGNOSIS — Z87891 Personal history of nicotine dependence: Secondary | ICD-10-CM

## 2018-04-13 DIAGNOSIS — Z7982 Long term (current) use of aspirin: Secondary | ICD-10-CM

## 2018-04-13 DIAGNOSIS — R634 Abnormal weight loss: Secondary | ICD-10-CM

## 2018-04-13 DIAGNOSIS — R197 Diarrhea, unspecified: Secondary | ICD-10-CM

## 2018-04-13 LAB — COMPREHENSIVE METABOLIC PANEL
ALBUMIN: 3.5 g/dL (ref 3.5–5.0)
ALT: 26 U/L (ref 0–44)
ANION GAP: 9 (ref 5–15)
AST: 21 U/L (ref 15–41)
Alkaline Phosphatase: 64 U/L (ref 38–126)
BUN: 21 mg/dL — ABNORMAL HIGH (ref 6–20)
CO2: 26 mmol/L (ref 22–32)
Calcium: 9.9 mg/dL (ref 8.9–10.3)
Chloride: 105 mmol/L (ref 98–111)
Creatinine, Ser: 0.98 mg/dL (ref 0.61–1.24)
GFR calc non Af Amer: >60 mL/min (ref >60)
GLUCOSE: 88 mg/dL (ref 70–99)
POTASSIUM: 4.3 mmol/L (ref 3.5–5.1)
SODIUM: 140 mmol/L (ref 135–145)
Total Bilirubin: 0.6 mg/dL (ref 0.3–1.2)
Total Protein: 6.9 g/dL (ref 6.5–8.1)

## 2018-04-13 LAB — CBC WITH DIFFERENTIAL/PLATELET
BASOS ABS: 0 10*3/uL (ref 0–0.1)
Basophils Relative: 1 %
Eosinophils Absolute: 0.2 10*3/uL (ref 0–0.7)
Eosinophils Relative: 5 %
HEMATOCRIT: 36.3 % — AB (ref 40.0–52.0)
HEMOGLOBIN: 12.2 g/dL — AB (ref 13.0–18.0)
LYMPHS PCT: 18 %
Lymphs Abs: 0.9 10*3/uL — ABNORMAL LOW (ref 1.0–3.6)
MCH: 29.9 pg (ref 26.0–34.0)
MCHC: 33.8 g/dL (ref 32.0–36.0)
MCV: 88.4 fL (ref 80.0–100.0)
MONOS PCT: 9 %
Monocytes Absolute: 0.4 10*3/uL (ref 0.2–1.0)
NEUTROS ABS: 3.3 10*3/uL (ref 1.4–6.5)
NEUTROS PCT: 67 %
Platelets: 169 10*3/uL (ref 150–440)
RBC: 4.1 MIL/uL — ABNORMAL LOW (ref 4.40–5.90)
RDW: 14.3 % (ref 11.5–14.5)
WBC: 4.8 10*3/uL (ref 3.8–10.6)

## 2018-04-13 MED ORDER — HEPARIN SOD (PORK) LOCK FLUSH 100 UNIT/ML IV SOLN: 500.0000 [IU] | Freq: Once | INTRAVENOUS | Status: DC | PRN

## 2018-04-13 MED ORDER — SODIUM CHLORIDE 0.9% FLUSH
10.0000 mL | INTRAVENOUS | Status: DC | PRN
  Filled 2018-04-13: qty 10

## 2018-04-13 NOTE — Progress Notes (Signed)
[facility] OFFICE PROGRESS NOTE  Patient Care Team: Dion Body, MD as PCP - General (Family Medicine) [name], MD as PCP - Cardiology (Cardiology) Telford Nab, RN as Registered Nurse  Cancer Staging Primary cancer of right lower lobe of lung [facility]) Staging form: Lung, AJCC 8th Edition - Clinical stage from 03/02/2018: Stage IV (cT4, cN0, pM1a) - Unsigned    Oncology History   # June 2019- LUNG CA- HIGH GRADE CA with sarcomatoid features [EGFR MUTATED; exon 19; clinically non-small cell];II opinion at [facility]  # June 17th- carbo-taxol #1; June 26th 2019- Osimertinib '80mg'$ /day; S/p palliative RT [facility]; June 28th, 2019]  # HTN/ cryptogenic stroke    DIAGNOSIS: non-small cell lung ca  STAGE:  IV       ;GOALS: palliative  CURRENT/MOST RECENT THERAPY- June 26th OSIMERTINIB       Primary cancer of right lower lobe of lung ([place])      INTERVAL HISTORY:  Ruben Eaton 47 y.o.  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  Patient admits to increasing energy levels.  Appetite improving.  Denies any worsening shortness of breath or cough.  Interested in having his Pleurx catheter taken out.   Leg swelling improving.  Review of Systems  Constitutional: Negative for chills, diaphoresis, fever, malaise/fatigue and weight loss.  HENT: Negative for nosebleeds and sore throat.   Eyes: Negative for double vision.  Respiratory: Negative for cough, hemoptysis, sputum production, shortness of breath and wheezing.   Cardiovascular: Negative for chest pain, palpitations, orthopnea and leg swelling.  Gastrointestinal: Negative for abdominal pain, blood in stool, constipation, diarrhea, heartburn, melena, nausea and vomiting.  Genitourinary: Negative for dysuria, frequency and urgency.  Musculoskeletal: Negative for back pain and joint pain.  Skin: Negative.   Negative for itching and rash.  Neurological: Negative for dizziness, tingling, focal weakness, weakness and headaches.  Endo/Heme/Allergies: Does not bruise/bleed easily.  Psychiatric/Behavioral: Negative for depression. The patient is not nervous/anxious and does not have insomnia.       PAST MEDICAL HISTORY :  Past Medical History:  Diagnosis Date  . Cryptogenic stroke ([place]) 08/2016   a. 09/15/2016 in setting of PFO.  . CVA (cerebral vascular accident) ([place]) 09/07/2016  . ED (erectile dysfunction)   . Essential (primary) hypertension 02/01/2016  . History of stroke 10/08/2016  . Hx of insomnia   . Hypertension   . OSA on CPAP   . PFO (patent foramen ovale)    a. s/p closure 09/2016 by Dr. [name].  . Pleural effusion on right 02/21/2018  . Primary cancer of right lower lobe of lung ([place]) 02/25/2018  . PVC's (premature ventricular contractions) 10/08/2016  . 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 TIA (transient ischemic attack) 09/07/2016    PAST SURGICAL HISTORY :   Past Surgical History:  Procedure Laterality Date  . APPENDECTOMY  1980  . EYE SURGERY    . PATENT FORAMEN OVALE CLOSURE  10/07/2016  . PATENT FORAMEN OVALE CLOSURE N/A 10/07/2016   Procedure: Patent [place](PFO) Closure;  Surgeon: [name], MD;  Location: [facility];  Service: Cardiovascular;  Laterality: N/A;  . PORTACATH PLACEMENT N/A 02/23/2018   Procedure: INSERTION PORT-A-CATH;  Surgeon: Nestor Lewandowsky, MD;  Location: [facility];  Service: Thoracic;  Laterality: N/A;  . STRABISMUS SURGERY Left 1984  . TEE WITHOUT CARDIOVERSION N/A 09/08/2016   Procedure: TRANSESOPHAGEAL ECHOCARDIOGRAM (TEE);  Surgeon: [name], MD;  Location: [facility] OR[facility];  Service: Cardiovascular;  Laterality: N/A;  . VIDEO ASSISTED THORACOSCOPY (VATS) W/TALC PLEUADESIS Right 02/23/2018   Procedure: VIDEO ASSISTED THORACOSCOPY (VATS) W/TALC PLEUADESIS;  Surgeon: Nestor Lewandowsky, MD;  Location: [facility] OR[facility];   Service: Thoracic;  Laterality: Right;  . VITRECTOMY Right 2013    FAMILY HISTORY :   Family History  Problem Relation Age of Onset  . Other Paternal Uncle        Abdominal tumor     SOCIAL HISTORY:   Social History   Tobacco Use  . Smoking status: Former Smoker    Packs/day: 0.50    Years: 10.00    Pack years: 5.00    Last attempt to quit: 2011    Years since quitting: 8.5  . Smokeless tobacco: Former Systems developer    Types: Snuff  Substance Use Topics  . Alcohol use: Yes    Alcohol/week: 1.8 oz    Types: 3 Cans of beer per week  . Drug use: No    ALLERGIES:  has No Known Allergies.  MEDICATIONS:  Current Outpatient Medications  Medication Sig Dispense Refill  . acetaminophen (TYLENOL) 325 MG tablet Take 2 tablets (650 mg total) by mouth every 6 (six) hours as needed for mild pain (or Fever >/= 101).    Marland Kitchen aspirin EC 81 MG EC tablet Take 1 tablet (81 mg total) by mouth daily. 120 tablet 0  . B Complex-C (B-COMPLEX WITH VITAMIN C) tablet Take 1 tablet by mouth daily.    Marland Kitchen dextromethorphan-guaiFENesin (MUCINEX DM) 30-600 MG 12hr tablet Take 1 tablet by mouth 2 (two) times daily. 60 tablet 0  . loratadine (CLARITIN) 10 MG tablet Take 10 mg by mouth daily as needed for allergies.     Marland Kitchen LORazepam (ATIVAN) 0.5 MG tablet Take 1 tablet (0.5 mg total) by mouth every 8 (eight) hours as needed for anxiety. 30 tablet 0  . metoprolol tartrate (LOPRESSOR) 25 MG tablet Take 1 tablet (25 mg total) by mouth 2 (two) times daily. 60 tablet 2  . Morphine Sulfate (MORPHINE CONCENTRATE) 10 mg / 0.5 ml concentrated solution Take 1 mL (20 mg total) by mouth every 4 (four) hours as needed for shortness of breath. (Patient taking differently: Take 5-10 mg by mouth every 4 (four) hours as needed for shortness of breath. ) 118 mL 0  . Multiple Vitamin (MULTIVITAMIN WITH MINERALS) TABS tablet Take 1 tablet by mouth daily.    Marland Kitchen osimertinib mesylate (TAGRISSO) 80 MG tablet Take 1 tablet (80 mg total) by mouth  daily. 30 tablet 5  . prochlorperazine (COMPAZINE) 10 MG tablet Take 1 tablet (10 mg total) by mouth every 6 (six) hours as needed (Nausea or vomiting). 30 tablet 1  . Vitamin D, Ergocalciferol, (DRISDOL) 50000 units CAPS capsule Take 1 capsule (50,000 Units total) by mouth every 7 (seven) days. (Patient taking differently: Take 50,000 Units by mouth every 7 (seven) days. Mondays) 12 capsule 0  . zolpidem (AMBIEN) 10 MG tablet Take 10 mg by mouth at bedtime as needed for sleep.     Marland Kitchen atorvastatin (LIPITOR) 40 MG tablet Take 1 tablet (40 mg total) by mouth daily at 6 PM. (Patient not taking: Reported on 04/13/2018) 30 tablet 0  . Diclofenac Sodium 2 % SOLN Apply 1 pump twice daily as needed. (Patient not taking: Reported on 04/13/2018) 112 g 3  . ondansetron (ZOFRAN) 8 MG tablet Take 1 tablet (8 mg total) by mouth 2 (two) times  daily as needed for refractory nausea / vomiting. (Patient not taking: Reported on 04/13/2018) 30 tablet 1  . polyethylene glycol (MIRALAX / GLYCOLAX) packet Take 17 g by mouth daily as needed. (Patient not taking: Reported on 04/13/2018) 14 each 0  . tadalafil (CIALIS) 5 MG tablet Take 5 mg by mouth daily as needed for erectile dysfunction.    . triamcinolone (NASACORT) 55 MCG/ACT AERO nasal inhaler Place 2 sprays into the nose daily as needed (congestion).      No current facility-administered medications for this visit.     PHYSICAL EXAMINATION: ECOG PERFORMANCE STATUS: 1 - Symptomatic but completely ambulatory  BP 95/62 (Patient Position: Sitting)   Pulse 80   Temp 97.9 F (36.6 C) (Tympanic)   Resp 20   Ht '5\' 6"'$  (1.676 m)   Wt 148 lb (67.1 kg)   BMI 23.89 kg/m   Filed Weights   04/13/18 1023  Weight: 148 lb (67.1 kg)    GENERAL: Well-nourished well-developed; Alert, no distress and comfortable.  Accompanied by his wife. EYES: no pallor or icterus OROPHARYNX: no thrush or ulceration; NECK: supple; no lymph nodes felt. LYMPH:  no palpable lymphadenopathy in  the axillary or inguinal regions LUNGS: Decreased breath sounds auscultation bilaterally. No wheeze or crackles HEART/CVS: regular rate & rhythm and no murmurs; 1+ bilateral lower extremity edema right chest wall Pleurx catheter. ABDOMEN:abdomen soft, non-tender and normal bowel sounds. No hepatomegaly or splenomegaly.  Musculoskeletal:no cyanosis of digits and no clubbing  PSYCH: alert & oriented x 3 with fluent speech NEURO: no focal motor/sensory deficits SKIN:  no rashes or significant lesions    LABORATORY DATA:  I have reviewed the data as listed    Component Value Date/Time   NA 140 04/13/2018 0955   NA 141 10/05/2016 0848   K 4.3 04/13/2018 0955   K 4.3 05/15/2013 0612   CL 105 04/13/2018 0955   CO2 26 04/13/2018 0955   GLUCOSE 88 04/13/2018 0955   BUN 21 (H) 04/13/2018 0955   BUN 17 10/05/2016 0848   CREATININE 0.98 04/13/2018 0955   CALCIUM 9.9 04/13/2018 0955   PROT 6.9 04/13/2018 0955   ALBUMIN 3.5 04/13/2018 0955   AST 21 04/13/2018 0955   ALT 26 04/13/2018 0955   ALKPHOS 64 04/13/2018 0955   BILITOT 0.6 04/13/2018 0955   GFRNONAA >60 04/13/2018 0955   GFRAA >60 04/13/2018 0955    No results found for: SPEP, UPEP  Lab Results  Component Value Date   WBC 4.8 04/13/2018   NEUTROABS 3.3 04/13/2018   HGB 12.2 (L) 04/13/2018   HCT 36.3 (L) 04/13/2018   MCV 88.4 04/13/2018   PLT 169 04/13/2018      Chemistry      Component Value Date/Time   NA 140 04/13/2018 0955   NA 141 10/05/2016 0848   K 4.3 04/13/2018 0955   K 4.3 05/15/2013 0612   CL 105 04/13/2018 0955   CO2 26 04/13/2018 0955   BUN 21 (H) 04/13/2018 0955   BUN 17 10/05/2016 0848   CREATININE 0.98 04/13/2018 0955      Component Value Date/Time   CALCIUM 9.9 04/13/2018 0955   ALKPHOS 64 04/13/2018 0955   AST 21 04/13/2018 0955   ALT 26 04/13/2018 0955   BILITOT 0.6 04/13/2018 0955       RADIOGRAPHIC STUDIES: I have personally reviewed the radiological images as listed and agreed  with the findings in the report. No results found.   ASSESSMENT & PLAN:  Primary cancer of right lower lobe of lung ([place]) #Non-small cell lung cancer/stage IV-EGFR mutated exon 19-currently on osimertinib since June 26th; patient also received palliative radiation to the rapidly growing right pleural metastases.   # Significant clinical improvement noted with improvement of his oxygen requirement. [Off oxygen].  Patient seems to be tolerating Osimertinib 80 mg/day- very well except for mild diarrhea.  Continue osimertinib at this time.  # Diarrhea-grade 1 secondary to osimertinib .recommend Imodium as prn.  Stable.  #  bilateral lower extremity swelling likely second dependent edema;- improved   #Pleural effusion malignant Pleurx catheter draining about 10cc/ one a week; improved. will speak to Dr.Oaks.  Regarding explantation  #Chest wall pain second malignancy improved.  Continue current pain medication.  # follow up in 1 months/labs/CT scan prior.   Addendum: Discussed with Dr. Faith Rogue; kindly agrees to evaluate the patient to explant the Pleurx catheter.      Orders Placed This Encounter  Procedures  . CT Chest W Contrast    Standing Status:   Future    Standing Expiration Date:   04/13/2019    Order Specific Question:   ** REASON FOR EXAM (FREE TEXT)    Answer:   lung cancer; on therapy    Order Specific Question:   If indicated for the ordered procedure, I authorize the administration of contrast media per Radiology protocol    Answer:   Yes    Order Specific Question:   Preferred imaging location?    Answer:   [facility]    Order Specific Question:   Radiology Contrast Protocol - do NOT remove file path    Answer:   [email or web address]   All questions were answered. The patient knows to call the clinic with any problems, questions or concerns.      Cammie Sickle, MD 04/21/2018 12:53 PM

## 2018-04-13 NOTE — Assessment & Plan Note (Addendum)
#  Non-small cell lung cancer/stage IV-EGFR mutated exon 19-currently on osimertinib since June 26th; patient also received palliative radiation to the rapidly growing right pleural metastases.   # Significant clinical improvement noted with improvement of his oxygen requirement. [Off oxygen].  Patient seems to be tolerating Osimertinib 80 mg/day- very well except for mild diarrhea.  Continue osimertinib at this time.  # Diarrhea-grade 1 secondary to osimertinib .recommend Imodium as prn.  Stable.  #  bilateral lower extremity swelling likely second dependent edema;- improved   #Pleural effusion malignant Pleurx catheter draining about 10cc/ one a week; improved. will speak to Dr.Oaks.  Regarding explantation  #Chest wall pain second malignancy improved.  Continue current pain medication.  # follow up in 1 months/labs/CT scan prior.   Addendum: Discussed with Dr. Faith Rogue; kindly agrees to evaluate the patient to explant the Pleurx catheter.

## 2018-04-16 ENCOUNTER — Ambulatory Visit: Payer: Self-pay | Admitting: Internal Medicine

## 2018-04-16 ENCOUNTER — Ambulatory Visit: Payer: No Typology Code available for payment source

## 2018-04-16 MED FILL — TAGRISSO 80 MG TABLET: 80 | 30 days supply | Qty: 30 | Fill #1

## 2018-04-17 ENCOUNTER — Ambulatory Visit: Payer: No Typology Code available for payment source

## 2018-04-18 ENCOUNTER — Ambulatory Visit: Payer: No Typology Code available for payment source

## 2018-04-18 ENCOUNTER — Encounter: Payer: Self-pay | Admitting: Internal Medicine

## 2018-04-19 ENCOUNTER — Encounter: Payer: Self-pay | Admitting: Cardiothoracic Surgery

## 2018-04-19 ENCOUNTER — Ambulatory Visit: Payer: No Typology Code available for payment source

## 2018-04-20 ENCOUNTER — Ambulatory Visit: Payer: No Typology Code available for payment source

## 2018-04-23 ENCOUNTER — Telehealth: Payer: Self-pay

## 2018-04-23 ENCOUNTER — Ambulatory Visit: Payer: No Typology Code available for payment source

## 2018-04-23 NOTE — Telephone Encounter (Signed)
-----   Message from Sabino Gasser, RN sent at 04/20/2018  3:12 PM EDT ----- Regarding: RE: question Hyacinth Meeker, Dr. B just got off the phone with Dr. Genevive Bi. He said that to arrange for pleurx cath removal with Dr. Genevive Bi next week. - H ----- Message ----- From: Wayna Chalet, CMA Sent: 04/20/2018  11:18 AM To: Sabino Gasser, RN Subject: question                                       Good morning! I have a question. This patient had sent an e-mail to Dr. Rogue Bussing on 04/18/2018 and [place] on 04/19/2018 asking if we have been contacted for his Pleurx Catheter to be removed. Dr. Rogue Bussing had stated in an OV that he will contact Dr. Genevive Bi. However, I do not see any documentation about the topic. Can you find out so I could schedule patient a follow up appointment with Dr. Genevive Bi to discuss removal. Thank you so much!

## 2018-04-23 NOTE — Telephone Encounter (Signed)
After speaking to Dr. Genevive Bi, he stated that he had spoken to Dr. Rogue Bussing about patient's Pleurx Catheter being removed. I then called patient to let him know that. Therefore, he is scheduled to be seen tomorrow to be removed. Patient agreed and will be here.

## 2018-04-24 ENCOUNTER — Ambulatory Visit
Admission: RE | Admit: 2018-04-24 | Discharge: 2018-04-24 | Disposition: A | Discharge status: Home / Self Care | Payer: No Typology Code available for payment source | Source: Ambulatory Visit | Attending: Cardiothoracic Surgery | Admitting: Cardiothoracic Surgery

## 2018-04-24 ENCOUNTER — Other Ambulatory Visit: Payer: Self-pay

## 2018-04-24 ENCOUNTER — Ambulatory Visit (INDEPENDENT_AMBULATORY_CARE_PROVIDER_SITE_OTHER): Payer: No Typology Code available for payment source | Admitting: Cardiothoracic Surgery

## 2018-04-24 ENCOUNTER — Encounter: Payer: Self-pay | Admitting: Cardiothoracic Surgery

## 2018-04-24 VITALS — BP 119/82 | HR 81 | Temp 98.3°F | Wt 144.0 lb

## 2018-04-24 DIAGNOSIS — J90 Pleural effusion, not elsewhere classified: Secondary | ICD-10-CM | POA: Diagnosis present

## 2018-04-24 DIAGNOSIS — C3431 Malignant neoplasm of lower lobe, right bronchus or lung: Secondary | ICD-10-CM | POA: Diagnosis not present

## 2018-04-24 DIAGNOSIS — J9811 Atelectasis: Secondary | ICD-10-CM | POA: Diagnosis not present

## 2018-04-24 NOTE — Progress Notes (Signed)
[name] returns today in follow-up.  He has been draining his right-sided Pleurx catheter weekly.  There is only a small amount of blood in the tubing and he has been unable to drain much fluid.  He does take an aspirin which he had been on for several months secondary to a stroke.  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  He has done well since that time.  He comes in today to have his Pleurx catheter removed.  Description of procedure.  The patient was lying left side down in the cuff was palpable underneath the skin.  Using sterile technique 10 cc of 1% lidocaine were were infiltrated overlying the cuff.  A small skin incision was made after the patient was prepped and draped in usual sterile fashion overlying the cuff.  The cuff was exposed and the catheter was withdrawn from the chest.  Upon doing so there is up a small amount of bleeding from the tract site.  This was controlled with direct pressure.  The cuff was then dissected from the soft tissues and the catheter was cut and removed.  The soft tissues were closed with several interrupted 4-0 Vicryl sutures and the skin was also closed with 4-0 nylon sutures.  The skin exit site was left open.  Sterile dressings were applied.  I instructed the patient to leave the dressing intact for 48 hours.  He will call us if there are any problems.  He will come back in 6 days to have his sutures removed.  All of his questions were answered.

## 2018-04-24 NOTE — Patient Instructions (Signed)
Please do not remove the gauze nor shower for the next 48 hours.  Then you are allowed to shower but do not submerge in the bathtub.  We will see you back in 2 weeks to make sure that you are doing well. At this visit you do not need a chest x-ray.

## 2018-04-26 ENCOUNTER — Ambulatory Visit: Payer: Self-pay | Admitting: Physician Assistant

## 2018-04-30 ENCOUNTER — Ambulatory Visit: Payer: No Typology Code available for payment source | Admitting: *Deleted

## 2018-04-30 DIAGNOSIS — J90 Pleural effusion, not elsewhere classified: Secondary | ICD-10-CM

## 2018-04-30 NOTE — Progress Notes (Signed)
Patient came in today for a wound check.  The wound is clean, with no signs of infection noted. The sutures were removed and steri strips applied.

## 2018-05-08 ENCOUNTER — Ambulatory Visit
Admission: RE | Admit: 2018-05-08 | Discharge: 2018-05-08 | Disposition: A | Discharge status: Home / Self Care | Payer: No Typology Code available for payment source | Source: Ambulatory Visit | Attending: Internal Medicine | Admitting: Internal Medicine

## 2018-05-08 DIAGNOSIS — C3431 Malignant neoplasm of lower lobe, right bronchus or lung: Secondary | ICD-10-CM | POA: Insufficient documentation

## 2018-05-08 MED ORDER — IOHEXOL 300 MG/ML  SOLN
75.0000 mL | Freq: Once | INTRAMUSCULAR | Status: AC | PRN
  Administered 2018-05-08: 75 mL via INTRAVENOUS

## 2018-05-10 MED FILL — TAGRISSO 80 MG TABLET: 80 | 30 days supply | Qty: 30 | Fill #2

## 2018-05-11 ENCOUNTER — Inpatient Hospital Stay (HOSPITAL_BASED_OUTPATIENT_CLINIC_OR_DEPARTMENT_OTHER): Payer: No Typology Code available for payment source | Admitting: Internal Medicine

## 2018-05-11 ENCOUNTER — Encounter: Payer: Self-pay | Admitting: Internal Medicine

## 2018-05-11 ENCOUNTER — Inpatient Hospital Stay: Payer: No Typology Code available for payment source | Attending: Internal Medicine

## 2018-05-11 VITALS — BP 108/62 | HR 93 | Temp 97.2°F | Resp 16 | Wt 147.2 lb

## 2018-05-11 DIAGNOSIS — R197 Diarrhea, unspecified: Secondary | ICD-10-CM | POA: Diagnosis not present

## 2018-05-11 DIAGNOSIS — G4700 Insomnia, unspecified: Secondary | ICD-10-CM | POA: Diagnosis not present

## 2018-05-11 DIAGNOSIS — Z8673 Personal history of transient ischemic attack (TIA), and cerebral infarction without residual deficits: Secondary | ICD-10-CM | POA: Insufficient documentation

## 2018-05-11 DIAGNOSIS — Z9989 Dependence on other enabling machines and devices: Secondary | ICD-10-CM | POA: Diagnosis not present

## 2018-05-11 DIAGNOSIS — I10 Essential (primary) hypertension: Secondary | ICD-10-CM

## 2018-05-11 DIAGNOSIS — Z9981 Dependence on supplemental oxygen: Secondary | ICD-10-CM | POA: Insufficient documentation

## 2018-05-11 DIAGNOSIS — Z79899 Other long term (current) drug therapy: Secondary | ICD-10-CM | POA: Diagnosis not present

## 2018-05-11 DIAGNOSIS — C3431 Malignant neoplasm of lower lobe, right bronchus or lung: Secondary | ICD-10-CM | POA: Diagnosis not present

## 2018-05-11 DIAGNOSIS — R600 Localized edema: Secondary | ICD-10-CM | POA: Diagnosis not present

## 2018-05-11 DIAGNOSIS — F418 Other specified anxiety disorders: Secondary | ICD-10-CM | POA: Diagnosis not present

## 2018-05-11 DIAGNOSIS — Z87891 Personal history of nicotine dependence: Secondary | ICD-10-CM | POA: Insufficient documentation

## 2018-05-11 DIAGNOSIS — Z7982 Long term (current) use of aspirin: Secondary | ICD-10-CM | POA: Insufficient documentation

## 2018-05-11 DIAGNOSIS — J910 Malignant pleural effusion: Secondary | ICD-10-CM | POA: Diagnosis not present

## 2018-05-11 DIAGNOSIS — G4733 Obstructive sleep apnea (adult) (pediatric): Secondary | ICD-10-CM | POA: Insufficient documentation

## 2018-05-11 DIAGNOSIS — Z9221 Personal history of antineoplastic chemotherapy: Secondary | ICD-10-CM

## 2018-05-11 LAB — COMPREHENSIVE METABOLIC PANEL
ALK PHOS: 57 U/L (ref 38–126)
ALT: 14 U/L (ref 0–44)
AST: 23 U/L (ref 15–41)
Albumin: 3.2 g/dL — ABNORMAL LOW (ref 3.5–5.0)
Anion gap: 7 (ref 5–15)
BILIRUBIN TOTAL: 0.8 mg/dL (ref 0.3–1.2)
BUN: 16 mg/dL (ref 6–20)
CALCIUM: 9.6 mg/dL (ref 8.9–10.3)
CO2: 22 mmol/L (ref 22–32)
Chloride: 106 mmol/L (ref 98–111)
Creatinine, Ser: 0.92 mg/dL (ref 0.61–1.24)
GLUCOSE: 133 mg/dL — AB (ref 70–99)
POTASSIUM: 3.9 mmol/L (ref 3.5–5.1)
Sodium: 135 mmol/L (ref 135–145)
TOTAL PROTEIN: 6.4 g/dL — AB (ref 6.5–8.1)

## 2018-05-11 LAB — CBC WITH DIFFERENTIAL/PLATELET
BASOS ABS: 0 10*3/uL (ref 0–0.1)
Basophils Relative: 1 %
EOS ABS: 0.2 10*3/uL (ref 0–0.7)
EOS PCT: 5 %
HCT: 34.1 % — ABNORMAL LOW (ref 40.0–52.0)
HEMOGLOBIN: 11.5 g/dL — AB (ref 13.0–18.0)
Lymphocytes Relative: 21 %
Lymphs Abs: 1 10*3/uL (ref 1.0–3.6)
MCH: 29.9 pg (ref 26.0–34.0)
MCHC: 33.9 g/dL (ref 32.0–36.0)
MCV: 88.2 fL (ref 80.0–100.0)
Monocytes Absolute: 0.4 10*3/uL (ref 0.2–1.0)
Monocytes Relative: 9 %
NEUTROS PCT: 64 %
Neutro Abs: 3 10*3/uL (ref 1.4–6.5)
PLATELETS: 175 10*3/uL (ref 150–440)
RBC: 3.86 MIL/uL — AB (ref 4.40–5.90)
RDW: 17.1 % — ABNORMAL HIGH (ref 11.5–14.5)
WBC: 4.6 10*3/uL (ref 3.8–10.6)

## 2018-05-11 MED ORDER — LIDOCAINE-PRILOCAINE 2.5-2.5 % EX CREA: 1.0000 "application " | TOPICAL_CREAM | CUTANEOUS | 0 refills | Status: AC | PRN

## 2018-05-11 NOTE — Assessment & Plan Note (Addendum)
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  Improved.  #Continue osimertinib 80 mg a day tolerating well except for mild diarrhea.  # Diarrhea-grade 1 secondary to osimertinib .recommend Imodium as prn.  Stable  #Bilateral lower extremity edema-improved.    # Chest wall pain second malignancy improved. Morphine 6 mg qhs as needed.  #Insomnia/anxiety continue Ativan.  # follow up in 1 month/MD/port flush;    # I reviewed the blood work- with the patient in detail; also reviewed the imaging independently [as summarized above]; and with the patient in detail.

## 2018-05-11 NOTE — Progress Notes (Signed)
[place] OFFICE PROGRESS NOTE  Patient Care Team: Dion Body, MD as PCP - General (Family Medicine) [name], MD as PCP - Cardiology (Cardiology) Telford Nab, RN as Registered Nurse  Cancer Staging Primary cancer of right lower lobe of lung [facility]) Staging form: Lung, AJCC 8th Edition - Clinical stage from 03/02/2018: Stage IV (cT4, cN0, pM1a) - Unsigned    Oncology History   # June 2019- LUNG CA- HIGH GRADE CA with sarcomatoid features [EGFR MUTATED; exon 19; clinically non-small cell];II opinion at [facility]  # June 17th- carbo-taxol #1; PROGRESSION June 26th 2019- Osimertinib 33m/day; S/p palliative RT [facility]; June 28th, 2019]  # HTN/ cryptogenic stroke    DIAGNOSIS: non-small cell lung ca  STAGE:  IV       ;GOALS: palliative  CURRENT/MOST RECENT THERAPY- June 26th OSIMERTINIB       Primary cancer of right [place] lobe of lung ([place]      INTERVAL HISTORY:  MHOGAN HOOBLER89y.o.  male pleasant patient above history of metastatic adenocarcinoma the lung/EGFR mutated is currently osimertinib is here for follow-up/review the results of the CT scan.  Patient's appetite is improving.  Weight improving.  He is starting back in the gym.  Denies any headaches.  Denies unusual shortness of breath or cough.  Mild diarrhea not using Imodium.  Review of Systems  Constitutional: Negative for chills, diaphoresis, fever, malaise/fatigue and weight loss.  HENT: Negative for nosebleeds and sore throat.   Eyes: Negative for double vision.  Respiratory: Negative for cough, hemoptysis, sputum production, shortness of breath and wheezing.   Cardiovascular: Negative for chest pain, palpitations, orthopnea and leg swelling.  Gastrointestinal: Positive for diarrhea. Negative for abdominal pain, blood in stool, constipation, heartburn, melena, nausea and vomiting.  Genitourinary: Negative for dysuria, frequency and urgency.  Musculoskeletal: Negative for  back pain and joint pain.  Skin: Negative.  Negative for itching and rash.  Neurological: Negative for dizziness, tingling, focal weakness, weakness and headaches.  Endo/Heme/Allergies: Does not bruise/bleed easily.  Psychiatric/Behavioral: Negative for depression. The patient is not nervous/anxious and does not have insomnia.       PAST MEDICAL HISTORY :  Past Medical History:  Diagnosis Date  . [place]ogenic [place] ([place] 08[place]   a. 09/15/2016 in setting of PFO.  . CVA (cerebral vascular accident) ([place] 09/07/2016  . ED (erectile dysfunction)   . Essential (primary) hypertension 02/01/2016  . History of stroke 10/08/2016  . Hx of insomnia   . Hypertension   . OSA on CPAP   . PFO (patent foramen ovale)    a. s/p closure 09/2016 by Dr. CBurt Knack  . Pleural effusion on right 02/21/2018  . Primary cancer of right lower lobe of lung ([place] 02/25/2018   Chemo tx's  . PVC's (premature ventricular contractions) 10/08/2016  . 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 TIA (transient ischemic attack) 09/07/2016    PAST SURGICAL HISTORY :   Past Surgical History:  Procedure Laterality Date  . APPENDECTOMY  1980  . EYE SURGERY    . PATENT FORAMEN OVALE CLOSURE  10/07/2016  . PATENT FORAMEN OVALE CLOSURE N/A 10/07/2016   Procedure: Patent Forament [place](PFO) Closure;  Surgeon: MSherren Mocha MD;  [facility];  Service: Cardiovascular;  Laterality: N/A;  . PORTACATH PLACEMENT N/A 02/23/2018   Procedure: INSERTION PORT-A-CATH;  Surgeon: ONestor Lewandowsky MD;  Location: [facility] ORS;  Service: Thoracic;  Laterality: N/A;  . STRABISMUS SURGERY Left 1984  .  TEE WITHOUT CARDIOVERSION N/A 09/08/2016   Procedure: TRANSESOPHAGEAL ECHOCARDIOGRAM (TEE);  Surgeon: [name], MD;  Location: [facility] ORS;  Service: Cardiovascular;  Laterality: N/A;  . VIDEO ASSISTED THORACOSCOPY (VATS) W/TALC PLEUADESIS Right 02/23/2018   Procedure: VIDEO ASSISTED THORACOSCOPY (VATS) W/TALC PLEUADESIS;   Surgeon: Nestor Lewandowsky, MD;  Location: [facility] ORS;  Service: Thoracic;  Laterality: Right;  . VITRECTOMY Right 2013    FAMILY HISTORY :   Family History  Problem Relation Age of Onset  . Other Paternal Uncle        Abdominal tumor     SOCIAL HISTORY:   Social History   Tobacco Use  . Smoking status: Former Smoker    Packs/day: 0.50    Years: 10.00    Pack years: 5.00    Last attempt to quit: 2011    Years since quitting: 8.6  . Smokeless tobacco: Former Systems developer    Types: Snuff  Substance Use Topics  . Alcohol use: Yes    Alcohol/week: 3.0 standard drinks    Types: 3 Cans of beer per week  . Drug use: No    ALLERGIES:  has No Known Allergies.  MEDICATIONS:  Current Outpatient Medications  Medication Sig Dispense Refill  . acetaminophen (TYLENOL) 325 MG tablet Take 2 tablets (650 mg total) by mouth every 6 (six) hours as needed for mild pain (or Fever >/= 101).    Marland Kitchen aspirin EC 81 MG EC tablet Take 1 tablet (81 mg total) by mouth daily. 120 tablet 0  . atorvastatin (LIPITOR) 40 MG tablet Take 1 tablet (40 mg total) by mouth daily at 6 PM. 30 tablet 0  . B Complex-C (B-COMPLEX WITH VITAMIN C) tablet Take 1 tablet by mouth daily.    . Diclofenac Sodium 2 % SOLN Apply 1 pump twice daily as needed. 112 g 3  . loratadine (CLARITIN) 10 MG tablet Take 10 mg by mouth daily as needed for allergies.     . metoprolol tartrate (LOPRESSOR) 25 MG tablet Take 1 tablet (25 mg total) by mouth 2 (two) times daily. 60 tablet 2  . Morphine Sulfate (MORPHINE CONCENTRATE) 10 mg / 0.5 ml concentrated solution Take 1 mL (20 mg total) by mouth every 4 (four) hours as needed for shortness of breath. (Patient taking differently: Take 5-10 mg by mouth every 4 (four) hours as needed for shortness of breath. ) 118 mL 0  . Multiple Vitamin (MULTIVITAMIN WITH MINERALS) TABS tablet Take 1 tablet by mouth daily.    Marland Kitchen osimertinib mesylate (TAGRISSO) 80 MG tablet Take 1 tablet (80 mg total) by mouth daily. 30  tablet 5  . polyethylene glycol (MIRALAX / GLYCOLAX) packet Take 17 g by mouth daily as needed. 14 each 0  . prochlorperazine (COMPAZINE) 10 MG tablet Take 1 tablet (10 mg total) by mouth every 6 (six) hours as needed (Nausea or vomiting). 30 tablet 1  . tadalafil (CIALIS) 5 MG tablet Take 5 mg by mouth daily as needed for erectile dysfunction.    . triamcinolone (NASACORT) 55 MCG/ACT AERO nasal inhaler Place 2 sprays into the nose daily as needed (congestion).     . Vitamin D, Ergocalciferol, (DRISDOL) 50000 units CAPS capsule Take 1 capsule (50,000 Units total) by mouth every 7 (seven) days. (Patient taking differently: Take 50,000 Units by mouth every 7 (seven) days. Mondays) 12 capsule 0  . zolpidem (AMBIEN) 10 MG tablet Take 10 mg by mouth at bedtime as needed for sleep.     Marland Kitchen lidocaine-prilocaine (  EMLA) cream Apply 1 application topically as needed. Apply generously over the Mediport 45 minutes prior to chemotherapy. 30 g 0  . LORazepam (ATIVAN) 0.5 MG tablet Take 1 tablet (0.5 mg total) by mouth every 8 (eight) hours as needed for anxiety. 45 tablet 0   No current facility-administered medications for this visit.     PHYSICAL EXAMINATION: ECOG PERFORMANCE STATUS: 1 - Symptomatic but completely ambulatory  BP 108/62 (BP Location: Left Arm, Patient Position: Sitting)   Pulse 93   Temp (!) 97.2 F (36.2 C) (Tympanic)   Resp 16   Wt 147 lb 3.2 oz (66.8 kg)   BMI 23.76 kg/m   Filed Weights   05/11/18 1441  Weight: 147 lb 3.2 oz (66.8 kg)    Physical Exam  Constitutional: He is oriented to person, place, and time and well-developed, well-nourished, and in no distress.  HENT:  Head: Normocephalic and atraumatic.  Mouth/Throat: Oropharynx is clear and moist. No oropharyngeal exudate.  Eyes: Pupils are equal, round, and reactive to light.  Neck: Normal range of motion. Neck supple.  Cardiovascular: Normal rate and regular rhythm.  Pulmonary/Chest: No respiratory distress. He has  no wheezes.  Decreased breath sounds right side most of the bases.  Abdominal: Soft. Bowel sounds are normal. He exhibits no distension and no mass. There is no tenderness. There is no rebound and no guarding.  Musculoskeletal: Normal range of motion. He exhibits no edema or tenderness.  Neurological: He is alert and oriented to person, place, and time.  Skin: Skin is warm.  Psychiatric: Affect normal.       LABORATORY DATA:  I have reviewed the data as listed    Component Value Date/Time   NA 135 05/11/2018 1418   NA 141 10/05/2016 0848   K 3.9 05/11/2018 1418   K 4.3 05/15/2013 0612   CL 106 05/11/2018 1418   CO2 22 05/11/2018 1418   GLUCOSE 133 (H) 05/11/2018 1418   BUN 16 05/11/2018 1418   BUN 17 10/05/2016 0848   CREATININE 0.92 05/11/2018 1418   CALCIUM 9.6 05/11/2018 1418   PROT 6.4 (L) 05/11/2018 1418   ALBUMIN 3.2 (L) 05/11/2018 1418   AST 23 05/11/2018 1418   ALT 14 05/11/2018 1418   ALKPHOS 57 05/11/2018 1418   BILITOT 0.8 05/11/2018 1418   GFRNONAA >60 05/11/2018 1418   GFRAA >60 05/11/2018 1418    No results found for: SPEP, UPEP  Lab Results  Component Value Date   WBC 4.6 05/11/2018   NEUTROABS 3.0 05/11/2018   HGB 11.5 (L) 05/11/2018   HCT 34.1 (L) 05/11/2018   MCV 88.2 05/11/2018   PLT 175 05/11/2018      Chemistry      Component Value Date/Time   NA 135 05/11/2018 1418   NA 141 10/05/2016 0848   K 3.9 05/11/2018 1418   K 4.3 05/15/2013 0612   CL 106 05/11/2018 1418   CO2 22 05/11/2018 1418   BUN 16 05/11/2018 1418   BUN 17 10/05/2016 0848   CREATININE 0.92 05/11/2018 1418      Component Value Date/Time   CALCIUM 9.6 05/11/2018 1418   ALKPHOS 57 05/11/2018 1418   AST 23 05/11/2018 1418   ALT 14 05/11/2018 1418   BILITOT 0.8 05/11/2018 1418       RADIOGRAPHIC STUDIES: I have personally reviewed the radiological images as listed and agreed with the findings in the report. No results found.   ASSESSMENT & PLAN:  Primary cancer  of right lower lobe of lung ([place]) #Non-small cell lung cancer/stage IV-EGFR mutated exon 19-currently on osimertinib since June 26th 2019-August 2019 CT scan shows partial response; improvement of the right pleural-based nodules/pleural effusion.  Improved.  #Continue osimertinib 80 mg a day tolerating well except for mild diarrhea.  # Diarrhea-grade 1 secondary to osimertinib .recommend Imodium as prn.  Stable  #Bilateral lower extremity edema-improved.    # Chest wall pain second malignancy improved. Morphine 6 mg qhs as needed.  #Insomnia/anxiety continue Ativan.  # follow up in 1 month/MD/port flush;    # I reviewed the blood work- with the patient in detail; also reviewed the imaging independently [as summarized above]; and with the patient in detail.    Orders Placed This Encounter  Procedures  . CBC with Differential/Platelet    Standing Status:   Future    Standing Expiration Date:   05/12/2019  . Comprehensive metabolic panel    Standing Status:   Future    Standing Expiration Date:   05/12/2019  . CEA    Standing Status:   Future    Standing Expiration Date:   05/12/2019   All questions were answered. The patient knows to call the clinic with any problems, questions or concerns.      Cammie Sickle, MD 05/15/2018 8:42 AM

## 2018-05-14 ENCOUNTER — Other Ambulatory Visit: Payer: Self-pay | Admitting: Internal Medicine

## 2018-05-14 MED ORDER — LORAZEPAM 0.5 MG PO TABS: 0.5000 mg | ORAL_TABLET | Freq: Three times a day (TID) | ORAL | 0 refills | Status: DC | PRN

## 2018-05-17 ENCOUNTER — Other Ambulatory Visit: Payer: Self-pay

## 2018-05-17 DIAGNOSIS — J90 Pleural effusion, not elsewhere classified: Secondary | ICD-10-CM

## 2018-05-18 ENCOUNTER — Ambulatory Visit
Admission: RE | Admit: 2018-05-18 | Discharge: 2018-05-18 | Disposition: A | Discharge status: Home / Self Care | Payer: No Typology Code available for payment source | Source: Ambulatory Visit | Attending: Cardiothoracic Surgery | Admitting: Cardiothoracic Surgery

## 2018-05-18 ENCOUNTER — Ambulatory Visit (INDEPENDENT_AMBULATORY_CARE_PROVIDER_SITE_OTHER): Payer: No Typology Code available for payment source | Admitting: Cardiothoracic Surgery

## 2018-05-18 ENCOUNTER — Encounter: Payer: Self-pay | Admitting: Cardiothoracic Surgery

## 2018-05-18 VITALS — BP 117/77 | HR 88 | Temp 97.2°F | Resp 18 | Ht 66.0 in | Wt 149.8 lb

## 2018-05-18 DIAGNOSIS — J90 Pleural effusion, not elsewhere classified: Secondary | ICD-10-CM

## 2018-05-18 NOTE — Progress Notes (Signed)
Ruben Eaton returns today in further follow-up.  Overall has been getting along quite well.  He just want to make sure that his wounds were good prior to [place] into the ocean later this month when he goes on vacation.  He did have a CT scan done last week which showed continued extensive disease within the right hemithorax.  Today all of his wounds are healing as expected.  There is no open areas.  I told him that he was free to do whatever he would like to do as far as she is swimming and getting his wounds wet.  We did not make a return visit for him but would be happy to see him should the need arise.

## 2018-05-18 NOTE — Patient Instructions (Signed)
Please call our office if you have questions or concerns.   

## 2018-05-24 ENCOUNTER — Encounter: Payer: Self-pay | Admitting: Internal Medicine

## 2018-05-25 ENCOUNTER — Other Ambulatory Visit: Payer: Self-pay | Admitting: Internal Medicine

## 2018-05-25 MED ORDER — DICYCLOMINE HCL 10 MG PO CAPS: 10.0000 mg | ORAL_CAPSULE | Freq: Three times a day (TID) | ORAL | 3 refills | Status: AC

## 2018-05-25 NOTE — Progress Notes (Signed)
Sent script for bentyl.

## 2018-06-04 ENCOUNTER — Inpatient Hospital Stay: Payer: No Typology Code available for payment source | Attending: Internal Medicine

## 2018-06-04 ENCOUNTER — Inpatient Hospital Stay (HOSPITAL_BASED_OUTPATIENT_CLINIC_OR_DEPARTMENT_OTHER): Payer: No Typology Code available for payment source | Admitting: Internal Medicine

## 2018-06-04 ENCOUNTER — Encounter: Payer: Self-pay | Admitting: Internal Medicine

## 2018-06-04 ENCOUNTER — Other Ambulatory Visit: Payer: Self-pay

## 2018-06-04 ENCOUNTER — Telehealth: Payer: Self-pay | Admitting: Pharmacist

## 2018-06-04 VITALS — BP 120/66 | HR 92 | Temp 97.6°F | Resp 18 | Ht 66.0 in | Wt 151.0 lb

## 2018-06-04 DIAGNOSIS — L219 Seborrheic dermatitis, unspecified: Secondary | ICD-10-CM

## 2018-06-04 DIAGNOSIS — C3431 Malignant neoplasm of lower lobe, right bronchus or lung: Secondary | ICD-10-CM | POA: Insufficient documentation

## 2018-06-04 DIAGNOSIS — G893 Neoplasm related pain (acute) (chronic): Secondary | ICD-10-CM | POA: Diagnosis not present

## 2018-06-04 DIAGNOSIS — K521 Toxic gastroenteritis and colitis: Secondary | ICD-10-CM | POA: Insufficient documentation

## 2018-06-04 DIAGNOSIS — Z79899 Other long term (current) drug therapy: Secondary | ICD-10-CM | POA: Insufficient documentation

## 2018-06-04 DIAGNOSIS — F419 Anxiety disorder, unspecified: Secondary | ICD-10-CM | POA: Insufficient documentation

## 2018-06-04 DIAGNOSIS — J90 Pleural effusion, not elsewhere classified: Secondary | ICD-10-CM | POA: Insufficient documentation

## 2018-06-04 DIAGNOSIS — G4700 Insomnia, unspecified: Secondary | ICD-10-CM

## 2018-06-04 LAB — CBC WITH DIFFERENTIAL/PLATELET
BASOS ABS: 0 10*3/uL (ref 0–0.1)
BASOS PCT: 0 %
EOS ABS: 0.2 10*3/uL (ref 0–0.7)
EOS PCT: 4 %
HCT: 34.1 % — ABNORMAL LOW (ref 40.0–52.0)
HEMOGLOBIN: 11.5 g/dL — AB (ref 13.0–18.0)
Lymphocytes Relative: 18 %
Lymphs Abs: 1.1 10*3/uL (ref 1.0–3.6)
MCH: 30.1 pg (ref 26.0–34.0)
MCHC: 33.8 g/dL (ref 32.0–36.0)
MCV: 89.2 fL (ref 80.0–100.0)
Monocytes Absolute: 0.6 10*3/uL (ref 0.2–1.0)
Monocytes Relative: 10 %
NEUTROS PCT: 68 %
Neutro Abs: 4.4 10*3/uL (ref 1.4–6.5)
PLATELETS: 178 10*3/uL (ref 150–440)
RBC: 3.83 MIL/uL — AB (ref 4.40–5.90)
RDW: 17.5 % — ABNORMAL HIGH (ref 11.5–14.5)
WBC: 6.4 10*3/uL (ref 3.8–10.6)

## 2018-06-04 LAB — COMPREHENSIVE METABOLIC PANEL
ALT: 11 U/L (ref 0–44)
ANION GAP: 8 (ref 5–15)
AST: 18 U/L (ref 15–41)
Albumin: 3.4 g/dL — ABNORMAL LOW (ref 3.5–5.0)
Alkaline Phosphatase: 61 U/L (ref 38–126)
BUN: 23 mg/dL — ABNORMAL HIGH (ref 6–20)
CALCIUM: 9.3 mg/dL (ref 8.9–10.3)
CHLORIDE: 102 mmol/L (ref 98–111)
CO2: 23 mmol/L (ref 22–32)
CREATININE: 0.89 mg/dL (ref 0.61–1.24)
Glucose, Bld: 99 mg/dL (ref 70–99)
POTASSIUM: 4.4 mmol/L (ref 3.5–5.1)
SODIUM: 133 mmol/L — AB (ref 135–145)
TOTAL PROTEIN: 6.6 g/dL (ref 6.5–8.1)
Total Bilirubin: 0.7 mg/dL (ref 0.3–1.2)

## 2018-06-04 MED ORDER — SODIUM CHLORIDE 0.9% FLUSH
10.0000 mL | INTRAVENOUS | Status: DC | PRN
  Administered 2018-06-04: 10 mL via INTRAVENOUS
  Filled 2018-06-04: qty 10

## 2018-06-04 MED ORDER — HEPARIN SOD (PORK) LOCK FLUSH 100 UNIT/ML IV SOLN
500.0000 [IU] | Freq: Once | INTRAVENOUS | Status: AC
  Administered 2018-06-04: 500 [IU] via INTRAVENOUS

## 2018-06-04 MED ORDER — OXYCODONE-ACETAMINOPHEN 5-325 MG PO TABS: 1.0000 | ORAL_TABLET | Freq: Two times a day (BID) | ORAL | 0 refills | Status: DC | PRN

## 2018-06-04 NOTE — Assessment & Plan Note (Addendum)
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  STABLE.   #Continue osimertinib 80 mg a day tolerating well except for mild diarrhea/abdominal cramping.   # Diarrhea/Abdominal cramping-grade 1 secondary to osimertinib. Recommend Imodium/Bentayl as prn.STABLE.   # Skin rash- seborrhic dermatitis- on topical steroid. STABLE.   # Chest wall pain second malignancy improved. Morphine 6-8 mg qhs as needed; will prescribe percocet 5 /325 every 12 hours as needed. New script needed.   # Insomnia/anxiety continue Ativan prn.  # follow up in 1 month/MD; cbc/cmp

## 2018-06-04 NOTE — Progress Notes (Signed)
[place] OFFICE PROGRESS NOTE  Patient Care Team: Dion Body, MD as PCP - General (Family Medicine) [name], MD as PCP - Cardiology (Cardiology) Telford Nab, RN as Registered Nurse  Cancer Staging Primary cancer of right lower lobe of lung [facility]) Staging form: Lung, AJCC 8th Edition - Clinical stage from 03/02/2018: Stage IV (cT4, cN0, pM1a) - Unsigned    Oncology History   # June 2019- LUNG CA- HIGH GRADE CA with sarcomatoid features [EGFR MUTATED; exon 19; clinically non-small cell];II opinion at [facility]  # June 17th- carbo-taxol #1; PROGRESSION June 26th 2019- Osimertinib '80mg'$ /day; S/p palliative RT [facility]; June 28th, 2019]  # HTN/ cryptogenic stroke    DIAGNOSIS: non-small cell lung ca  STAGE:  IV       ;GOALS: palliative  CURRENT/MOST RECENT THERAPY- June 26th OSIMERTINIB       Primary cancer of right lower lobe of lung ([place])      INTERVAL HISTORY:  Ruben Eaton 47 y.o.  male pleasant patient above history of metastatic adenocarcinoma the lung/EGFR mutated is currently osimertinib is here for follow-up.  Patient continues to have intermittent pain in his right chest wall.  He has been taking morphine up to 8 mg every night as needed for sleep/pain.  However interested in getting an oral Percocet during other times.  Intermittent cramps in the abdomen stable on Bentyl.  Not any worse.  No new shortness of breath or cough.  Review of Systems  Constitutional: Positive for malaise/fatigue. Negative for chills, diaphoresis, fever and weight loss.  HENT: Negative for nosebleeds and sore throat.   Eyes: Negative for double vision.  Respiratory: Negative for cough, hemoptysis, sputum production, shortness of breath and wheezing.   Cardiovascular: Negative for chest pain, palpitations, orthopnea and leg swelling.  Gastrointestinal: Positive for abdominal pain (Cramping.;  Question right chest wall radiation.) and diarrhea. Negative  for blood in stool, constipation, heartburn, melena, nausea and vomiting.  Genitourinary: Negative for dysuria, frequency and urgency.  Musculoskeletal: Negative for back pain and joint pain.  Skin: Negative.  Negative for itching and rash.  Neurological: Negative for dizziness, tingling, focal weakness, weakness and headaches.  Endo/Heme/Allergies: Does not bruise/bleed easily.  Psychiatric/Behavioral: Negative for depression. The patient is not nervous/anxious and does not have insomnia.       PAST MEDICAL HISTORY :  Past Medical History:  Diagnosis Date  . Cryptogenic stroke ([place]) 08/2016   a. 09/15/2016 in setting of PFO.  . CVA (cerebral vascular accident) ([place]) 09/07/2016  . ED (erectile dysfunction)   . Essential (primary) hypertension 02/01/2016  . History of stroke 10/08/2016  . Hx of insomnia   . Hypertension   . OSA on CPAP   . PFO (patent foramen ovale)    a. s/p closure 09/2016 by Dr. [name].  . Pleural effusion on right 02/21/2018  . Primary cancer of right lower lobe of lung ([place]) 02/25/2018   Chemo tx's  . PVC's (premature ventricular contractions) 10/08/2016  . 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 TIA (transient ischemic attack) 09/07/2016    PAST SURGICAL HISTORY :   Past Surgical History:  Procedure Laterality Date  . APPENDECTOMY  1980  . EYE SURGERY    . PATENT FORAMEN OVALE CLOSURE  10/07/2016  . PATENT FORAMEN OVALE CLOSURE N/A 10/07/2016   Procedure: Patent Forament [place](PFO) Closure;  Surgeon: [name], MD;  Location: [place] CV LAB;  Service: Cardiovascular;  Laterality: N/A;  . PORTACATH PLACEMENT  N/A 02/23/2018   Procedure: INSERTION PORT-A-CATH;  Surgeon: Nestor Lewandowsky, MD;  Location: [facility] ORS;  Service: Thoracic;  Laterality: N/A;  . STRABISMUS SURGERY Left 1984  . TEE WITHOUT CARDIOVERSION N/A 09/08/2016   Procedure: TRANSESOPHAGEAL ECHOCARDIOGRAM (TEE);  Surgeon: [name], MD;  Location: [facility] ORS;  Service:  Cardiovascular;  Laterality: N/A;  . VIDEO ASSISTED THORACOSCOPY (VATS) W/TALC PLEUADESIS Right 02/23/2018   Procedure: VIDEO ASSISTED THORACOSCOPY (VATS) W/TALC PLEUADESIS;  Surgeon: Nestor Lewandowsky, MD;  Location: [facility] ORS;  Service: Thoracic;  Laterality: Right;  . VITRECTOMY Right 2013    FAMILY HISTORY :   Family History  Problem Relation Age of Onset  . Other Paternal Uncle        Abdominal tumor     SOCIAL HISTORY:   Social History   Tobacco Use  . Smoking status: Former Smoker    Packs/day: 0.50    Years: 10.00    Pack years: 5.00    Last attempt to quit: 2011    Years since quitting: 8.6  . Smokeless tobacco: Former Systems developer    Types: Snuff  Substance Use Topics  . Alcohol use: Yes    Alcohol/week: 3.0 standard drinks    Types: 3 Cans of beer per week  . Drug use: No    ALLERGIES:  has No Known Allergies.  MEDICATIONS:  Current Outpatient Medications  Medication Sig Dispense Refill  . acetaminophen (TYLENOL) 325 MG tablet Take 2 tablets (650 mg total) by mouth every 6 (six) hours as needed for mild pain (or Fever >/= 101).    Marland Kitchen aspirin EC 81 MG EC tablet Take 1 tablet (81 mg total) by mouth daily. 120 tablet 0  . B Complex-C (B-COMPLEX WITH VITAMIN C) tablet Take 1 tablet by mouth daily.    Marland Kitchen dicyclomine (BENTYL) 10 MG capsule Take 1 capsule (10 mg total) by mouth 4 (four) times daily -  before meals and at bedtime. 40 capsule 3  . ibuprofen (ADVIL,MOTRIN) 200 MG tablet Take 800 mg by mouth daily as needed for mild pain.    Marland Kitchen lidocaine-prilocaine (EMLA) cream Apply 1 application topically as needed. Apply generously over the Mediport 45 minutes prior to chemotherapy. 30 g 0  . loratadine (CLARITIN) 10 MG tablet Take 10 mg by mouth daily as needed for allergies.     Marland Kitchen LORazepam (ATIVAN) 0.5 MG tablet Take 1 tablet (0.5 mg total) by mouth every 8 (eight) hours as needed for anxiety. 45 tablet 0  . Morphine Sulfate (MORPHINE CONCENTRATE) 10 mg / 0.5 ml concentrated  solution Take 1 mL (20 mg total) by mouth every 4 (four) hours as needed for shortness of breath. (Patient taking differently: Take 5-10 mg by mouth every 4 (four) hours as needed for shortness of breath. ) 118 mL 0  . Multiple Vitamin (MULTIVITAMIN WITH MINERALS) TABS tablet Take 1 tablet by mouth daily.    Marland Kitchen osimertinib mesylate (TAGRISSO) 80 MG tablet Take 1 tablet (80 mg total) by mouth daily. 30 tablet 5  . prochlorperazine (COMPAZINE) 10 MG tablet Take 1 tablet (10 mg total) by mouth every 6 (six) hours as needed (Nausea or vomiting). 30 tablet 1  . triamcinolone (NASACORT) 55 MCG/ACT AERO nasal inhaler Place 2 sprays into the nose daily as needed (congestion).     . Vitamin D, Ergocalciferol, (DRISDOL) 50000 units CAPS capsule Take 1 capsule (50,000 Units total) by mouth every 7 (seven) days. (Patient taking differently: Take 50,000 Units by mouth every 7 (seven) days.  Mondays) 12 capsule 0  . zolpidem (AMBIEN) 10 MG tablet Take 10 mg by mouth at bedtime as needed for sleep.     . Diclofenac Sodium 2 % SOLN Apply 1 pump twice daily as needed. (Patient not taking: Reported on 06/04/2018) 112 g 3  . metoprolol tartrate (LOPRESSOR) 25 MG tablet Take 1 tablet (25 mg total) by mouth 2 (two) times daily. (Patient not taking: Reported on 06/04/2018) 60 tablet 2  . oxyCODONE-acetaminophen (PERCOCET) 5-325 MG tablet Take 1 tablet by mouth every 12 (twelve) hours as needed for severe pain. 60 tablet 0  . tadalafil (CIALIS) 5 MG tablet Take 5 mg by mouth daily as needed for erectile dysfunction.     No current facility-administered medications for this visit.     PHYSICAL EXAMINATION: ECOG PERFORMANCE STATUS: 1 - Symptomatic but completely ambulatory  BP 120/66 (BP Location: Right Arm, Patient Position: Sitting)   Pulse 92   Temp 97.6 F (36.4 C) (Tympanic)   Resp 18   Ht _0  (1.676 m)   Wt 151 lb (68.5 kg)   BMI 24.37 kg/m   Filed Weights   06/04/18 1025  Weight: 151 lb (68.5 kg)     Physical Exam  Constitutional: He is oriented to person, place, and time and well-developed, well-nourished, and in no distress.  HENT:  Head: Normocephalic and atraumatic.  Mouth/Throat: Oropharynx is clear and moist. No oropharyngeal exudate.  Eyes: Pupils are equal, round, and reactive to light.  Neck: Normal range of motion. Neck supple.  Cardiovascular: Normal rate and regular rhythm.  Pulmonary/Chest: No respiratory distress. He has no wheezes.  Decreased breath sounds right side most of the bases.  Right chest wall approximately 2-3 cm "KNOT"; mid-axillary line.   Abdominal: Soft. Bowel sounds are normal. He exhibits no distension and no mass. There is no tenderness. There is no rebound and no guarding.  Musculoskeletal: Normal range of motion. He exhibits no edema or tenderness.  Neurological: He is alert and oriented to person, place, and time.  Skin: Skin is warm.  Psychiatric: Affect normal.       LABORATORY DATA:  I have reviewed the data as listed    Component Value Date/Time   NA 133 (L) 06/04/2018 0956   NA 141 10/05/2016 0848   K 4.4 06/04/2018 0956   K 4.3 05/15/2013 0612   CL 102 06/04/2018 0956   CO2 23 06/04/2018 0956   GLUCOSE 99 06/04/2018 0956   BUN 23 (H) 06/04/2018 0956   BUN 17 10/05/2016 0848   CREATININE 0.89 06/04/2018 0956   CALCIUM 9.3 06/04/2018 0956   PROT 6.6 06/04/2018 0956   ALBUMIN 3.4 (L) 06/04/2018 0956   AST 18 06/04/2018 0956   ALT 11 06/04/2018 0956   ALKPHOS 61 06/04/2018 0956   BILITOT 0.7 06/04/2018 0956   GFRNONAA >60 06/04/2018 0956   GFRAA >60 06/04/2018 0956    No results found for: SPEP, UPEP  Lab Results  Component Value Date   WBC 6.4 06/04/2018   NEUTROABS 4.4 06/04/2018   HGB 11.5 (L) 06/04/2018   HCT 34.1 (L) 06/04/2018   MCV 89.2 06/04/2018   PLT 178 06/04/2018      Chemistry      Component Value Date/Time   NA 133 (L) 06/04/2018 0956   NA 141 10/05/2016 0848   K 4.4 06/04/2018 0956   K 4.3  05/15/2013 0612   CL 102 06/04/2018 0956   CO2 23 06/04/2018 0956   BUN 23 (  H) 06/04/2018 0956   BUN 17 10/05/2016 0848   CREATININE 0.89 06/04/2018 0956      Component Value Date/Time   CALCIUM 9.3 06/04/2018 0956   ALKPHOS 61 06/04/2018 0956   AST 18 06/04/2018 0956   ALT 11 06/04/2018 0956   BILITOT 0.7 06/04/2018 0956       RADIOGRAPHIC STUDIES: I have personally reviewed the radiological images as listed and agreed with the findings in the report. No results found.   ASSESSMENT & PLAN:  Primary cancer of right lower lobe of lung ([place]) #Non-small cell lung cancer/stage IV-EGFR mutated exon 19-currently on osimertinib since June 26th 2019-August 2019 CT scan shows partial response; improvement of the right pleural-based nodules/pleural effusion.  STABLE.   #Continue osimertinib 80 mg a day tolerating well except for mild diarrhea/abdominal cramping.   # Diarrhea/Abdominal cramping-grade 1 secondary to osimertinib. Recommend Imodium/Bentayl as prn.STABLE.   # Skin rash- seborrhic dermatitis- on topical steroid. STABLE.   # Chest wall pain second malignancy improved. Morphine 6-8 mg qhs as needed; will prescribe percocet 5 /325 every 12 hours as needed. New script needed.   # Insomnia/anxiety continue Ativan prn.  # follow up in 1 month/MD; cbc/cmp   Orders Placed This Encounter  Procedures  . CBC with Differential/Platelet    Standing Status:   Future    Standing Expiration Date:   06/05/2019  . Comprehensive metabolic panel    Standing Status:   Future    Standing Expiration Date:   06/05/2019   All questions were answered. The patient knows to call the clinic with any problems, questions or concerns.      Cammie Sickle, MD 06/04/2018 1:05 PM

## 2018-06-04 NOTE — Telephone Encounter (Signed)
Oral Chemotherapy Pharmacist Encounter  Follow-Up Form  Called patient today to follow up regarding patient's oral chemotherapy medication: Tagrisso (osimertinib)  Original Start date of oral chemotherapy: 02/2018  Pt reports 0 tablets/doses of Tagrisso missed in the last month.   Pt reports the following side effects: diarrhea, which he has been able to manage  New medications?: dicyclomine, no DDI with Tagrisso   Other Issues: None reported  Patient knows to call the office with questions or concerns. Oral Oncology Clinic will continue to follow.  Darl Pikes, PharmD, BCPS, [facility] Hematology/Oncology Clinical Pharmacist [facility] [place] [phone]  06/04/2018 1:27 PM

## 2018-06-05 LAB — CEA: CEA: 0.8 ng/mL (ref 0.0–4.7)

## 2018-06-08 ENCOUNTER — Ambulatory Visit: Payer: Self-pay | Admitting: Internal Medicine

## 2018-06-08 ENCOUNTER — Other Ambulatory Visit: Payer: Self-pay

## 2018-06-11 MED FILL — TAGRISSO 80 MG TABLET: 80 | 30 days supply | Qty: 30 | Fill #3

## 2018-06-18 ENCOUNTER — Ambulatory Visit (INDEPENDENT_AMBULATORY_CARE_PROVIDER_SITE_OTHER): Payer: No Typology Code available for payment source | Admitting: Physician Assistant

## 2018-06-18 VITALS — BP 116/70 | HR 104 | Ht 66.0 in | Wt 157.0 lb

## 2018-06-18 DIAGNOSIS — Q211 Atrial septal defect: Secondary | ICD-10-CM

## 2018-06-18 DIAGNOSIS — I493 Ventricular premature depolarization: Secondary | ICD-10-CM | POA: Diagnosis not present

## 2018-06-18 DIAGNOSIS — R609 Edema, unspecified: Secondary | ICD-10-CM | POA: Diagnosis not present

## 2018-06-18 DIAGNOSIS — C3491 Malignant neoplasm of unspecified part of right bronchus or lung: Secondary | ICD-10-CM | POA: Diagnosis not present

## 2018-06-18 DIAGNOSIS — Q2112 Patent foramen ovale: Secondary | ICD-10-CM

## 2018-06-18 MED ORDER — FUROSEMIDE 20 MG PO TABS: 20.0000 mg | ORAL_TABLET | Freq: Every day | ORAL | 6 refills | Status: AC | PRN

## 2018-06-18 NOTE — Progress Notes (Signed)
Cardiology Office Note    Date:  06/18/2018   ID:  Ruben Eaton, DOB 1970-11-24, MRN [id number]  PCP:  Dion Body, MD  Cardiologist:  Dr. [name]  EP: Dr. Caryl Comes   Chief Complaint: follow up   History of Present Illness:   Ruben Eaton is a 47 y.o. male with hx of CVA., PFO s/p closer, ED, OSA on CPAP, non small cell lung cancer, PVCs and HTN presented for follow up.   Patient had acute CVA 09/07/16. Had positive echo with bubble study. Underwent successful placement of 25 mm Amplatzer PFO Occluder 10/07/16. Treated with DAPT x 6 months.   admitted 02/2018 for dyspnea and finding of large right pleural effusion in the setting of a large right pleural mass and mediastinal lymph nodes. Further work up revealed Non-small cell lung cancer/stage IV. On osimertinib.   Seen by Dr. Caryl Comes 03/2017 for PVCs.  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  Echocardiogram around that time (6/19) demonstrated normal LV function. No plan for therapy currently. Plan f/u in 3 months.   Here today for follow up.  No complaints.  Denies chest pain or shortness of breath.  Use CPAP for 5 to 6 hours and then use oxygen at night.  He has lower extremity edema.  Endorses high salt intake.  No orthopnea or PND.  Compliant with medication.  Has noted intermittent palpitation since stopped his metoprolol few weeks ago.  Past Medical History:  Diagnosis Date  . 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 09/15/2016 in setting of PFO.  . CVA (cerebral vascular accident) ([place]) 09/07/2016  . ED (erectile dysfunction)   . Essential (primary) hypertension 02/01/2016  . History of stroke 10/08/2016  . Hx of insomnia   . Hypertension   . OSA on CPAP   . PFO (patent foramen ovale)    a. s/p closure 09/2016 by Dr. [name].  . Pleural effusion on right 02/21/2018  . Primary cancer of right lower lobe of lung ([place]) 02/25/2018   Chemo tx's  . PVC's (premature ventricular contractions) 10/08/2016  .  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 TIA (transient ischemic attack) 09/07/2016    Past Surgical History:  Procedure Laterality Date  . APPENDECTOMY  1980  . EYE SURGERY    . PATENT FORAMEN OVALE CLOSURE  10/07/2016  . PATENT FORAMEN OVALE CLOSURE N/A 10/07/2016   Procedure: Patent [place](PFO) Closure;  Surgeon: [name], MD;  Location: [place] [facility];  Service: Cardiovascular;  Laterality: N/A;  . PORTACATH PLACEMENT N/A 02/23/2018   Procedure: INSERTION PORT-A-CATH;  Surgeon: Nestor Lewandowsky, MD;  Location: [facility];  Service: Thoracic;  Laterality: N/A;  . STRABISMUS SURGERY Left 1984  . TEE WITHOUT CARDIOVERSION N/A 09/08/2016   Procedure: TRANSESOPHAGEAL ECHOCARDIOGRAM (TEE);  Surgeon: [name], MD;  Location: [facility];  Service: Cardiovascular;  Laterality: N/A;  . VIDEO ASSISTED THORACOSCOPY (VATS) W/TALC PLEUADESIS Right 02/23/2018   Procedure: VIDEO ASSISTED THORACOSCOPY (VATS) W/TALC PLEUADESIS;  Surgeon: Nestor Lewandowsky, MD;  Location: [facility];  Service: Thoracic;  Laterality: Right;  . VITRECTOMY Right 2013    Current Medications: Prior to Admission medications   Medication Sig Start Date End Date Taking? Authorizing Provider  acetaminophen (TYLENOL) 325 MG tablet Take 2 tablets (650 mg total) by mouth every 6 (six) hours as needed for mild pain (or Fever >/= 101). 03/27/18   Debbe Odea, MD  aspirin EC 81 MG EC tablet Take 1 tablet (81  mg total) by mouth daily. 09/08/16   Bettey Costa, MD  B Complex-C (B-COMPLEX WITH VITAMIN C) tablet Take 1 tablet by mouth daily.    [provider]  Diclofenac Sodium 2 % SOLN Apply 1 pump twice daily as needed. Patient not taking: Reported on 06/04/2018 02/01/16   Lyndal Pulley, DO  dicyclomine (BENTYL) 10 MG capsule Take 1 capsule (10 mg total) by mouth 4 (four) times daily -  before meals and at bedtime. 05/25/18   Cammie Sickle, MD  ibuprofen (ADVIL,MOTRIN) 200 MG tablet Take 800 mg  by mouth daily as needed for mild pain.    [provider]  lidocaine-prilocaine (EMLA) cream Apply 1 application topically as needed. Apply generously over the Mediport 45 minutes prior to chemotherapy. 05/11/18   Cammie Sickle, MD  loratadine (CLARITIN) 10 MG tablet Take 10 mg by mouth daily as needed for allergies.     [provider]  LORazepam (ATIVAN) 0.5 MG tablet Take 1 tablet (0.5 mg total) by mouth every 8 (eight) hours as needed for anxiety. 05/14/18   Cammie Sickle, MD  metoprolol tartrate (LOPRESSOR) 25 MG tablet Take 1 tablet (25 mg total) by mouth 2 (two) times daily. Patient not taking: Reported on 06/04/2018 02/27/18   Dustin Flock, MD  Morphine Sulfate (MORPHINE CONCENTRATE) 10 mg / 0.5 ml concentrated solution Take 1 mL (20 mg total) by mouth every 4 (four) hours as needed for shortness of breath. Patient taking differently: Take 5-10 mg by mouth every 4 (four) hours as needed for shortness of breath.  03/12/18   Earlie Server, MD  Multiple Vitamin (MULTIVITAMIN WITH MINERALS) TABS tablet Take 1 tablet by mouth daily.    [provider]  osimertinib mesylate (TAGRISSO) 80 MG tablet Take 1 tablet (80 mg total) by mouth daily. 03/19/18   Cammie Sickle, MD  oxyCODONE-acetaminophen (PERCOCET) 5-325 MG tablet Take 1 tablet by mouth every 12 (twelve) hours as needed for severe pain. 06/04/18   Cammie Sickle, MD  prochlorperazine (COMPAZINE) 10 MG tablet Take 1 tablet (10 mg total) by mouth every 6 (six) hours as needed (Nausea or vomiting). 03/13/18   Earlie Server, MD  tadalafil (CIALIS) 5 MG tablet Take 5 mg by mouth daily as needed for erectile dysfunction.    [provider]  triamcinolone (NASACORT) 55 MCG/ACT AERO nasal inhaler Place 2 sprays into the nose daily as needed (congestion).     [provider]  Vitamin D, Ergocalciferol, (DRISDOL) 50000 units CAPS capsule Take 1 capsule (50,000 Units total) by mouth every 7  (seven) days. Patient taking differently: Take 50,000 Units by mouth every 7 (seven) days. Mondays 05/05/16   Lyndal Pulley, DO  zolpidem (AMBIEN) 10 MG tablet Take 10 mg by mouth at bedtime as needed for sleep.  12/10/15   [provider]    Allergies:   Patient has no known allergies.   Social History   Socioeconomic History  . Marital status: Married    Spouse name: [name]   . Number of children: Not on file  . Years of education: Not on file  . Highest education level: Not on file  Occupational History  . Occupation: Software engineer  Social Needs  . Financial resource strain: Not on file  . Food insecurity:    Worry: Not on file    Inability: Not on file  . Transportation needs:    Medical: Not on file    Non-medical: Not on file  Tobacco Use  . Smoking status: Former Smoker    Packs/day: 0.50    Years: 10.00    Pack years: 5.00    Last attempt to quit: 2011    Years since quitting: 8.7  . Smokeless tobacco: Former Systems developer    Types: Snuff  Substance and Sexual Activity  . Alcohol use: Yes    Alcohol/week: 3.0 standard drinks    Types: 3 Cans of beer per week  . Drug use: No  . Sexual activity: Yes  Lifestyle  . Physical activity:    Days per week: Not on file    Minutes per session: Not on file  . Stress: Not on file  Relationships  . Social connections:    Talks on phone: Not on file    Gets together: Not on file    Attends religious service: Not on file    Active member of club or organization: Not on file    Attends meetings of clubs or organizations: Not on file    Relationship status: Not on file  Other Topics Concern  . Not on file  Social History Narrative  . Not on file     Family History:  The patient's family history includes Other in his paternal uncle.   ROS:   Please see the history of present illness.    ROS All other systems reviewed and are negative.   PHYSICAL EXAM:   VS:  BP 116/70   Pulse (!) 104   Ht 5\' 6"  (1.676 m)   Wt  157 lb (71.2 kg)   BMI 25.34 kg/m    GEN: Well nourished, well developed, in no acute distress  HEENT: normal  Neck: no JVD, carotid bruits, or masses Cardiac: RRR; no murmurs, rubs, or gallops, trace to 1+ bilateral lower extremity edema Respiratory:  clear to auscultation bilaterally, normal work of breathing GI: soft, nontender, nondistended, + BS MS: no deformity or atrophy  Skin: warm and dry, no rash Neuro:  Alert and Oriented x 3, Strength and sensation are intact Psych: euthymic mood, full affect  Wt Readings from Last 3 Encounters:  06/18/18 157 lb (71.2 kg)  06/04/18 151 lb (68.5 kg)  05/18/18 149 lb 12.8 oz (67.9 kg)      Studies/Labs Reviewed:   EKG:  EKG is ordered today.  The ekg ordered today demonstrates sinus tachycardia at rate of 104 bpm, TWI inferior laterally which is similar to 03/20/18.  Recent Labs: 02/26/2018: TSH 2.501 03/12/2018: Magnesium 1.8 03/20/2018: B Natriuretic Peptide 64.0 06/04/2018: ALT 11; BUN 23; Creatinine, Ser 0.89; Hemoglobin 11.5; Platelets 178; Potassium 4.4; Sodium 133   Lipid Panel    Component Value Date/Time   CHOL 228 (H) 09/07/2016 0307   TRIG 44 09/07/2016 0307   HDL 108 09/07/2016 0307   CHOLHDL 2.1 09/07/2016 0307   VLDL 9 09/07/2016 0307   LDLCALC 111 (H) 09/07/2016 0307    Additional studies/ records that were reviewed today include:   Echocardiogram: 02/2018 Study Conclusions  - Left ventricle: The cavity size was normal. Wall thickness was   normal. Systolic function was normal. The estimated ejection   fraction was in the range of 55% to 60%. Wall motion was normal;   there were no regional wall motion abnormalities. The study is   not technically sufficient to allow evaluation of LV diastolic   function. - Atrial septum: A closure device was normal in appearance and   without apparent shunt; there was no structural compromise.  Holter  02/2018  Normal sinus rhythm Very frequent PVCs with a total of 45,024  hours representing 34% burden. Mostly in the form of ventricular bigeminy.  ASSESSMENT & PLAN:    1. Status post PFO closure Normal device function by echocardiogram 02/2018.  2.  PVC -Repeat echocardiogram in a month as recommended by Dr. Caryl Comes.  He has intermittent palpitation since discontinuation of his metoprolol.  I have encouraged him to restart metoprolol at 12.5 mg twice daily and then increased to 25mg  BID.  3.  Bilateral lower extremity edema -No orthopnea or PND.  Encouraged cut back on her salt intake.  PRN Lasix.  He is scheduled to have echocardiogram done soon.  4.  Non-small cell lung cancer -Per oncology.   He is planning to restart exercise as tolerated after discussion.    Medication Adjustments/Labs and Tests Ordered: Current medicines are reviewed at length with the patient today.  Concerns regarding medicines are outlined above.  Medication changes, Labs and Tests ordered today are listed in the Patient Instructions below. Patient Instructions  Medication Instructions:  1. RESTART METOPROLOL TARTRATE 25 MG TWICE DAILY  2. START LASIX 20 MG DAILY ONLY AS NEEDED FOR EXCESS SWELLING  Labwork: NONE ORDERED TODAY  Testing/Procedures: PLEASE SCHEDULE THE ECHOCARDIOGRAM ORDERED BY DR. Caryl Comes  Follow-Up: 6 MONTHS WITH DR. [name]   Any Other Special Instructions Will Be Listed Below (If Applicable).     If you need a refill on your cardiac medications before your next appointment, please call your pharmacy.      Jarrett Soho, [place]  06/18/2018 9:48 AM    [facility] [address], [place], [place]  [address] Phone: [phone]; Fax: [phone]

## 2018-06-18 NOTE — Patient Instructions (Signed)
Medication Instructions:  1. RESTART METOPROLOL TARTRATE 25 MG TWICE DAILY  2. START LASIX 20 MG DAILY ONLY AS NEEDED FOR EXCESS SWELLING  Labwork: NONE ORDERED TODAY  Testing/Procedures: PLEASE SCHEDULE THE ECHOCARDIOGRAM ORDERED BY DR. Caryl Comes  Follow-Up: 6 MONTHS WITH DR. [name]   Any Other Special Instructions Will Be Listed Below (If Applicable).     If you need a refill on your cardiac medications before your next appointment, please call your pharmacy.

## 2018-06-21 ENCOUNTER — Other Ambulatory Visit: Payer: Self-pay

## 2018-06-22 ENCOUNTER — Ambulatory Visit: Payer: No Typology Code available for payment source

## 2018-06-22 ENCOUNTER — Other Ambulatory Visit: Payer: Self-pay

## 2018-06-22 DIAGNOSIS — I493 Ventricular premature depolarization: Secondary | ICD-10-CM

## 2018-06-22 DIAGNOSIS — I340 Nonrheumatic mitral (valve) insufficiency: Secondary | ICD-10-CM

## 2018-06-22 DIAGNOSIS — R0600 Dyspnea, unspecified: Secondary | ICD-10-CM

## 2018-06-25 ENCOUNTER — Other Ambulatory Visit: Payer: Self-pay | Admitting: Oncology

## 2018-06-25 MED ORDER — MORPHINE SULFATE (CONCENTRATE) 10 MG /0.5 ML PO SOLN: 10.0000 mg | Freq: Two times a day (BID) | ORAL | 0 refills | Status: DC | PRN

## 2018-06-26 ENCOUNTER — Telehealth: Payer: Self-pay | Admitting: Cardiovascular Disease

## 2018-06-26 ENCOUNTER — Telehealth: Payer: Self-pay

## 2018-06-26 NOTE — Telephone Encounter (Signed)
Patient wife calling to discuss recent ECHO testing results   Please call

## 2018-06-26 NOTE — Telephone Encounter (Signed)
Error

## 2018-06-26 NOTE — Telephone Encounter (Signed)
Patient's wife, per dpr,  made aware of results and verbalized her understanding.  Notes recorded by Constance Haw, MD on 06/26/2018 at 10:09 AM EDT Ejection fraction remains normal. No changes.

## 2018-07-02 ENCOUNTER — Encounter: Payer: Self-pay | Admitting: Internal Medicine

## 2018-07-02 ENCOUNTER — Inpatient Hospital Stay: Payer: No Typology Code available for payment source | Attending: Internal Medicine

## 2018-07-02 ENCOUNTER — Other Ambulatory Visit: Payer: Self-pay

## 2018-07-02 ENCOUNTER — Inpatient Hospital Stay (HOSPITAL_BASED_OUTPATIENT_CLINIC_OR_DEPARTMENT_OTHER): Payer: No Typology Code available for payment source | Admitting: Internal Medicine

## 2018-07-02 VITALS — BP 104/64 | HR 98 | Temp 98.3°F | Resp 20 | Ht 66.0 in | Wt 160.0 lb

## 2018-07-02 DIAGNOSIS — R05 Cough: Secondary | ICD-10-CM | POA: Diagnosis not present

## 2018-07-02 DIAGNOSIS — R5381 Other malaise: Secondary | ICD-10-CM | POA: Insufficient documentation

## 2018-07-02 DIAGNOSIS — Z923 Personal history of irradiation: Secondary | ICD-10-CM | POA: Diagnosis not present

## 2018-07-02 DIAGNOSIS — G4709 Other insomnia: Secondary | ICD-10-CM | POA: Insufficient documentation

## 2018-07-02 DIAGNOSIS — I10 Essential (primary) hypertension: Secondary | ICD-10-CM

## 2018-07-02 DIAGNOSIS — G893 Neoplasm related pain (acute) (chronic): Secondary | ICD-10-CM | POA: Insufficient documentation

## 2018-07-02 DIAGNOSIS — Z8673 Personal history of transient ischemic attack (TIA), and cerebral infarction without residual deficits: Secondary | ICD-10-CM | POA: Insufficient documentation

## 2018-07-02 DIAGNOSIS — C3431 Malignant neoplasm of lower lobe, right bronchus or lung: Secondary | ICD-10-CM | POA: Insufficient documentation

## 2018-07-02 DIAGNOSIS — R5383 Other fatigue: Secondary | ICD-10-CM

## 2018-07-02 DIAGNOSIS — Z9989 Dependence on other enabling machines and devices: Secondary | ICD-10-CM | POA: Insufficient documentation

## 2018-07-02 DIAGNOSIS — Z5111 Encounter for antineoplastic chemotherapy: Secondary | ICD-10-CM | POA: Diagnosis not present

## 2018-07-02 DIAGNOSIS — F419 Anxiety disorder, unspecified: Secondary | ICD-10-CM | POA: Diagnosis not present

## 2018-07-02 DIAGNOSIS — Z87891 Personal history of nicotine dependence: Secondary | ICD-10-CM | POA: Diagnosis not present

## 2018-07-02 DIAGNOSIS — G4733 Obstructive sleep apnea (adult) (pediatric): Secondary | ICD-10-CM | POA: Insufficient documentation

## 2018-07-02 DIAGNOSIS — Z7982 Long term (current) use of aspirin: Secondary | ICD-10-CM | POA: Diagnosis not present

## 2018-07-02 DIAGNOSIS — R51 Headache: Secondary | ICD-10-CM | POA: Insufficient documentation

## 2018-07-02 DIAGNOSIS — J90 Pleural effusion, not elsewhere classified: Secondary | ICD-10-CM

## 2018-07-02 DIAGNOSIS — Z23 Encounter for immunization: Secondary | ICD-10-CM | POA: Diagnosis not present

## 2018-07-02 DIAGNOSIS — R197 Diarrhea, unspecified: Secondary | ICD-10-CM | POA: Diagnosis not present

## 2018-07-02 DIAGNOSIS — R0789 Other chest pain: Secondary | ICD-10-CM | POA: Diagnosis not present

## 2018-07-02 DIAGNOSIS — Z79899 Other long term (current) drug therapy: Secondary | ICD-10-CM

## 2018-07-02 LAB — COMPREHENSIVE METABOLIC PANEL
ALK PHOS: 62 U/L (ref 38–126)
ALT: 13 U/L (ref 0–44)
ANION GAP: 8 (ref 5–15)
AST: 24 U/L (ref 15–41)
Albumin: 3 g/dL — ABNORMAL LOW (ref 3.5–5.0)
BILIRUBIN TOTAL: 0.7 mg/dL (ref 0.3–1.2)
BUN: 16 mg/dL (ref 6–20)
CALCIUM: 9.3 mg/dL (ref 8.9–10.3)
CO2: 26 mmol/L (ref 22–32)
CREATININE: 0.89 mg/dL (ref 0.61–1.24)
Chloride: 97 mmol/L — ABNORMAL LOW (ref 98–111)
GFR calc Af Amer: >60 mL/min (ref >60)
GFR calc non Af Amer: >60 mL/min (ref >60)
Glucose, Bld: 98 mg/dL (ref 70–99)
Potassium: 4.4 mmol/L (ref 3.5–5.1)
Sodium: 131 mmol/L — ABNORMAL LOW (ref 135–145)
Total Protein: 6.1 g/dL — ABNORMAL LOW (ref 6.5–8.1)

## 2018-07-02 LAB — CBC WITH DIFFERENTIAL/PLATELET
BASOS PCT: 1 %
Basophils Absolute: 0 10*3/uL (ref 0–0.1)
EOS ABS: 0.1 10*3/uL (ref 0–0.7)
Eosinophils Relative: 2 %
HCT: 29.9 % — ABNORMAL LOW (ref 40.0–52.0)
HEMOGLOBIN: 10.2 g/dL — AB (ref 13.0–18.0)
Lymphocytes Relative: 13 %
Lymphs Abs: 0.9 10*3/uL — ABNORMAL LOW (ref 1.0–3.6)
MCH: 29.8 pg (ref 26.0–34.0)
MCHC: 34.3 g/dL (ref 32.0–36.0)
MCV: 86.8 fL (ref 80.0–100.0)
Monocytes Absolute: 0.6 10*3/uL (ref 0.2–1.0)
Monocytes Relative: 9 %
NEUTROS PCT: 75 %
Neutro Abs: 4.9 10*3/uL (ref 1.4–6.5)
Platelets: 181 10*3/uL (ref 150–440)
RBC: 3.44 MIL/uL — ABNORMAL LOW (ref 4.40–5.90)
RDW: 15 % — ABNORMAL HIGH (ref 11.5–14.5)
WBC: 6.5 10*3/uL (ref 3.8–10.6)

## 2018-07-02 NOTE — Assessment & Plan Note (Addendum)
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  STABLE.   # Continue osimertinib 80 mg a day tolerating; tolerating fairly well.  #Slight worsening shortness of breath cough-question progression of disease versus other causes.  Ordered CT scan chest and pelvis in 2 weeks.  # Diarrhea/Abdominal cramping-grade 1 secondary to osimertinib. Recommend Imodium/Bentayl as prn.STABLE.   # Chest wall pain second malignancy; stable.  Morphine 6-8 mg qhs as needed; continue Percocet 5 /325 every 12 hours as needed.  # Insomnia/anxiety continue Ativan prn.  Disposition: # Follow-up-labs [CBC CMP]; MD-2 weeks; CT scan chest and pelvis prior- Dr.B

## 2018-07-02 NOTE — Progress Notes (Signed)
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  male pleasant patient above history of metastatic adenocarcinoma the lung/EGFR mutated is currently osimertinib is here for follow-up.  Patient continues to have intermittent pain in his right chest wall.  Patient continues to take morphine liquid at nighttime for sleeping/pain.  Also taking Percocet as needed.  Abdominal cramps have improved.  However patient has noticed slight worsening shortness of breath/mild increasing cough dry over the last week or so.   Review of Systems  Constitutional: Positive for malaise/fatigue. Negative for chills, diaphoresis, fever and weight loss.  HENT: Negative for nosebleeds and sore throat.   Eyes: Negative for double vision.  Respiratory: Positive for cough and shortness of breath. Negative for hemoptysis, sputum production and wheezing.   Cardiovascular: Positive for chest pain (Right chest wall pain.). Negative for palpitations, orthopnea and leg swelling.  Gastrointestinal: Positive for diarrhea. Negative for  blood in stool, constipation, heartburn, melena, nausea and vomiting.  Genitourinary: Negative for dysuria, frequency and urgency.  Musculoskeletal: Negative for back pain and joint pain.  Skin: Negative.  Negative for itching and rash.  Neurological: Negative for dizziness, tingling, focal weakness, weakness and headaches.  Endo/Heme/Allergies: Does not bruise/bleed easily.  Psychiatric/Behavioral: Negative for depression. The patient is not nervous/anxious and does not have insomnia.       PAST MEDICAL HISTORY :  Past Medical History:  Diagnosis Date  . Cryptogenic stroke ([place]) 08/2016   a. 09/15/2016 in setting of PFO.  . CVA (cerebral vascular accident) ([place]) 09/07/2016  . ED (erectile dysfunction)   . Essential (primary) hypertension 02/01/2016  . History of stroke 10/08/2016  . Hx of insomnia   . Hypertension   . OSA on CPAP   . PFO (patent foramen ovale)    a. s/p closure 09/2016 by Dr. [name].  . Pleural effusion on right 02/21/2018  . Primary cancer of right lower lobe of lung ([place]) 02/25/2018   Chemo tx's  . PVC's (premature ventricular contractions) 10/08/2016  . 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 TIA (transient ischemic attack) 09/07/2016    PAST SURGICAL HISTORY :   Past Surgical History:  Procedure Laterality Date  . APPENDECTOMY  1980  . EYE SURGERY    . PATENT FORAMEN OVALE CLOSURE  10/07/2016  . PATENT FORAMEN OVALE CLOSURE N/A 10/07/2016   Procedure: Patent [place](PFO) Closure;  Surgeon: [name], MD;  Location: [place] CV LAB;  Service: Cardiovascular;  Laterality: N/A;  . PORTACATH PLACEMENT N/A 02/23/2018   Procedure: INSERTION PORT-A-CATH;  Surgeon: Nestor Lewandowsky, MD;  Location: [facility] ORS;  Service: Thoracic;  Laterality: N/A;  . STRABISMUS SURGERY Left 1984  . TEE WITHOUT CARDIOVERSION N/A 09/08/2016   Procedure: TRANSESOPHAGEAL ECHOCARDIOGRAM (TEE);  Surgeon: [name], MD;  Location: [facility] ORS;  Service:  Cardiovascular;  Laterality: N/A;  . VIDEO ASSISTED THORACOSCOPY (VATS) W/TALC PLEUADESIS Right 02/23/2018   Procedure: VIDEO ASSISTED THORACOSCOPY (VATS) W/TALC PLEUADESIS;  Surgeon: Nestor Lewandowsky, MD;  Location: [facility] ORS;  Service: Thoracic;  Laterality: Right;  . VITRECTOMY Right 2013    FAMILY HISTORY :   Family History  Problem Relation Age of Onset  . Other Paternal Uncle        Abdominal tumor     SOCIAL HISTORY:   Social History   Tobacco Use  . Smoking status: Former Smoker    Packs/day: 0.50    Years: 10.00    Pack years: 5.00    Last attempt to quit: 2011    Years since quitting: 8.7  . Smokeless tobacco: Former Systems developer    Types: Snuff  Substance Use Topics  . Alcohol use: Yes    Alcohol/week: 3.0 standard drinks    Types: 3 Cans of beer per week  . Drug use: No    ALLERGIES:  has No Known Allergies.  MEDICATIONS:  Current Outpatient Medications  Medication Sig Dispense Refill  . acetaminophen (TYLENOL) 325 MG tablet Take 2 tablets (650 mg total) by mouth every 6 (six) hours as needed for mild pain (or Fever >/= 101).    Marland Kitchen aspirin EC 81 MG EC tablet Take 1 tablet (81 mg total) by mouth daily. 120 tablet 0  . B Complex-C (B-COMPLEX WITH VITAMIN C) tablet Take 1 tablet by mouth daily.    . Diclofenac Sodium 2 % SOLN Apply 1 pump twice daily as needed. 112 g 3  . furosemide (LASIX) 20 MG tablet Take 1 tablet (20 mg total) by mouth daily as needed for edema. 30 tablet 6  . ibuprofen (ADVIL,MOTRIN) 200 MG tablet Take 800 mg by mouth daily as needed for mild pain.    Marland Kitchen lidocaine-prilocaine (EMLA) cream Apply 1 application topically as needed. Apply generously over the Mediport 45 minutes prior to chemotherapy. 30 g 0  . loratadine (CLARITIN) 10 MG tablet Take 10 mg by mouth daily as needed for allergies.     Marland Kitchen LORazepam (ATIVAN) 0.5 MG tablet Take 1 tablet (0.5 mg total) by mouth every 8 (eight) hours as needed for anxiety. 45 tablet 0  . metoprolol tartrate (LOPRESSOR)  25 MG tablet Take 1 tablet (25 mg total) by mouth 2 (two) times daily. 60 tablet 2  . morphine (MS CONTIN) 15 MG 12 hr tablet Take 15 mg by mouth as needed for pain.    Marland Kitchen Morphine Sulfate (MORPHINE CONCENTRATE) 10 mg / 0.5 ml concentrated solution Take 0.5 mLs (10 mg total) by mouth every 12 (twelve) hours as needed for severe pain. 118 mL 0  . Multiple Vitamin (MULTIVITAMIN WITH MINERALS) TABS tablet Take 1 tablet by mouth daily.    Marland Kitchen osimertinib mesylate (TAGRISSO) 80 MG tablet Take 1 tablet (80 mg total) by mouth daily. 30 tablet 5  . oxyCODONE-acetaminophen (PERCOCET) 5-325 MG tablet Take 1 tablet by mouth every 12 (twelve) hours as needed for severe pain. 60 tablet 0  . prochlorperazine (COMPAZINE) 10 MG tablet Take 1 tablet (10 mg total) by mouth every 6 (six) hours as needed (Nausea or vomiting). 30 tablet 1  . tadalafil (CIALIS) 5  MG tablet Take 5 mg by mouth daily as needed for erectile dysfunction.    . triamcinolone (NASACORT) 55 MCG/ACT AERO nasal inhaler Place 2 sprays into the nose daily as needed (congestion).     . Vitamin D, Ergocalciferol, (DRISDOL) 50000 units CAPS capsule Take 1 capsule (50,000 Units total) by mouth every 7 (seven) days. (Patient taking differently: Take 50,000 Units by mouth every 7 (seven) days. Mondays) 12 capsule 0  . zolpidem (AMBIEN) 10 MG tablet Take 10 mg by mouth at bedtime as needed for sleep.     Marland Kitchen dicyclomine (BENTYL) 10 MG capsule Take 1 capsule (10 mg total) by mouth 4 (four) times daily -  before meals and at bedtime. (Patient not taking: Reported on 07/02/2018) 40 capsule 3   No current facility-administered medications for this visit.     PHYSICAL EXAMINATION: ECOG PERFORMANCE STATUS: 1 - Symptomatic but completely ambulatory  BP 104/64 (BP Location: Right Arm, Patient Position: Sitting)   Pulse 98   Temp 98.3 F (36.8 C) (Oral)   Resp 20   Ht '5\' 6"'$  (1.676 m)   Wt 160 lb (72.6 kg)   BMI 25.82 kg/m   Filed Weights   07/02/18 1044   Weight: 160 lb (72.6 kg)    Physical Exam  Constitutional: He is oriented to person, place, and time and well-developed, well-nourished, and in no distress.  HENT:  Head: Normocephalic and atraumatic.  Mouth/Throat: Oropharynx is clear and moist. No oropharyngeal exudate.  Eyes: Pupils are equal, round, and reactive to light.  Neck: Normal range of motion. Neck supple.  Cardiovascular: Normal rate and regular rhythm.  Pulmonary/Chest: No respiratory distress. He has no wheezes.  Decreased breath sounds right side most of the bases.  Right chest wall approximately 2-3 cm "KNOT"; mid-axillary line.   Abdominal: Soft. Bowel sounds are normal. He exhibits no distension and no mass. There is no tenderness. There is no rebound and no guarding.  Musculoskeletal: Normal range of motion. He exhibits no edema or tenderness.  Neurological: He is alert and oriented to person, place, and time.  Skin: Skin is warm.  Psychiatric: Affect normal.       LABORATORY DATA:  I have reviewed the data as listed    Component Value Date/Time   NA 131 (L) 07/02/2018 1029   NA 141 10/05/2016 0848   K 4.4 07/02/2018 1029   K 4.3 05/15/2013 0612   CL 97 (L) 07/02/2018 1029   CO2 26 07/02/2018 1029   GLUCOSE 98 07/02/2018 1029   BUN 16 07/02/2018 1029   BUN 17 10/05/2016 0848   CREATININE 0.89 07/02/2018 1029   CALCIUM 9.3 07/02/2018 1029   PROT 6.1 (L) 07/02/2018 1029   ALBUMIN 3.0 (L) 07/02/2018 1029   AST 24 07/02/2018 1029   ALT 13 07/02/2018 1029   ALKPHOS 62 07/02/2018 1029   BILITOT 0.7 07/02/2018 1029   GFRNONAA >60 07/02/2018 1029   GFRAA >60 07/02/2018 1029    No results found for: SPEP, UPEP  Lab Results  Component Value Date   WBC 6.5 07/02/2018   NEUTROABS 4.9 07/02/2018   HGB 10.2 (L) 07/02/2018   HCT 29.9 (L) 07/02/2018   MCV 86.8 07/02/2018   PLT 181 07/02/2018      Chemistry      Component Value Date/Time   NA 131 (L) 07/02/2018 1029   NA 141 10/05/2016 0848   K  4.4 07/02/2018 1029   K 4.3 05/15/2013 0612   CL 97 (L)  07/02/2018 1029   CO2 26 07/02/2018 1029   BUN 16 07/02/2018 1029   BUN 17 10/05/2016 0848   CREATININE 0.89 07/02/2018 1029      Component Value Date/Time   CALCIUM 9.3 07/02/2018 1029   ALKPHOS 62 07/02/2018 1029   AST 24 07/02/2018 1029   ALT 13 07/02/2018 1029   BILITOT 0.7 07/02/2018 1029       RADIOGRAPHIC STUDIES: I have personally reviewed the radiological images as listed and agreed with the findings in the report. No results found.   ASSESSMENT & PLAN:  Primary cancer of right lower lobe of lung ([place]) #Non-small cell lung cancer/stage IV-EGFR mutated exon 19-currently on osimertinib since June 26th 2019-August 2019 CT scan shows partial response; improvement of the right pleural-based nodules/pleural effusion.  STABLE.   # Continue osimertinib 80 mg a day tolerating; tolerating fairly well.  #Slight worsening shortness of breath cough-question progression of disease versus other causes.  Ordered CT scan chest and pelvis in 2 weeks.  # Diarrhea/Abdominal cramping-grade 1 secondary to osimertinib. Recommend Imodium/Bentayl as prn.STABLE.   # Chest wall pain second malignancy; stable.  Morphine 6-8 mg qhs as needed; continue Percocet 5 /325 every 12 hours as needed.  # Insomnia/anxiety continue Ativan prn.  Disposition: # Follow-up-labs [CBC CMP]; MD-2 weeks; CT scan chest and pelvis prior- Dr.B   Orders Placed This Encounter  Procedures  . CT CHEST W CONTRAST    Standing Status:   Future    Standing Expiration Date:   07/03/2019    Order Specific Question:   If indicated for the ordered procedure, I authorize the administration of contrast media per Radiology protocol    Answer:   Yes    Order Specific Question:   Preferred imaging location?    Answer:   [facility]    Order Specific Question:   Radiology Contrast Protocol - do NOT remove file path    Answer:    [email or web address]    Order Specific Question:   ** REASON FOR EXAM (FREE TEXT)    Answer:   lung cancer  . CT Abdomen Pelvis W Contrast    Standing Status:   Future    Standing Expiration Date:   07/02/2019    Order Specific Question:   ** REASON FOR EXAM (FREE TEXT)    Answer:   lung cancer    Order Specific Question:   If indicated for the ordered procedure, I authorize the administration of contrast media per Radiology protocol    Answer:   Yes    Order Specific Question:   Preferred imaging location?    Answer:   [facility]    Order Specific Question:   Is Oral Contrast requested for this exam?    Answer:   Yes, Per Radiology protocol    Order Specific Question:   Radiology Contrast Protocol - do NOT remove file path    Answer:   [email or web address]  . CBC with Differential/Platelet    Standing Status:   Future    Standing Expiration Date:   07/03/2019  . Comprehensive metabolic panel    Standing Status:   Future    Standing Expiration Date:   07/03/2019   All questions were answered. The patient knows to call the clinic with any problems, questions or concerns.      Cammie Sickle, MD 07/03/2018 7:45 AM

## 2018-07-05 ENCOUNTER — Encounter: Payer: Self-pay | Admitting: Internal Medicine

## 2018-07-10 ENCOUNTER — Ambulatory Visit: Payer: No Typology Code available for payment source

## 2018-07-10 ENCOUNTER — Encounter: Payer: Self-pay | Admitting: Internal Medicine

## 2018-07-12 ENCOUNTER — Ambulatory Visit
Admission: RE | Admit: 2018-07-12 | Discharge: 2018-07-12 | Disposition: A | Discharge status: Home / Self Care | Payer: No Typology Code available for payment source | Source: Ambulatory Visit | Attending: Internal Medicine | Admitting: Internal Medicine

## 2018-07-12 DIAGNOSIS — C3431 Malignant neoplasm of lower lobe, right bronchus or lung: Secondary | ICD-10-CM | POA: Diagnosis not present

## 2018-07-12 MED ORDER — IOPAMIDOL (ISOVUE-300) INJECTION 61%
100.0000 mL | Freq: Once | INTRAVENOUS | Status: AC | PRN
  Administered 2018-07-12: 100 mL via INTRAVENOUS

## 2018-07-12 MED FILL — TAGRISSO 80 MG TABLET: 80 | 30 days supply | Qty: 30 | Fill #4

## 2018-07-13 ENCOUNTER — Other Ambulatory Visit: Payer: Self-pay

## 2018-07-13 ENCOUNTER — Inpatient Hospital Stay: Payer: No Typology Code available for payment source

## 2018-07-13 ENCOUNTER — Inpatient Hospital Stay (HOSPITAL_BASED_OUTPATIENT_CLINIC_OR_DEPARTMENT_OTHER): Payer: No Typology Code available for payment source | Admitting: Internal Medicine

## 2018-07-13 ENCOUNTER — Encounter: Payer: Self-pay | Admitting: Internal Medicine

## 2018-07-13 VITALS — BP 112/68 | HR 114 | Temp 97.6°F | Resp 18 | Wt 158.2 lb

## 2018-07-13 DIAGNOSIS — C3431 Malignant neoplasm of lower lobe, right bronchus or lung: Secondary | ICD-10-CM

## 2018-07-13 DIAGNOSIS — R0789 Other chest pain: Secondary | ICD-10-CM

## 2018-07-13 DIAGNOSIS — G893 Neoplasm related pain (acute) (chronic): Secondary | ICD-10-CM

## 2018-07-13 DIAGNOSIS — Z8673 Personal history of transient ischemic attack (TIA), and cerebral infarction without residual deficits: Secondary | ICD-10-CM

## 2018-07-13 DIAGNOSIS — G4709 Other insomnia: Secondary | ICD-10-CM

## 2018-07-13 DIAGNOSIS — R51 Headache: Secondary | ICD-10-CM

## 2018-07-13 DIAGNOSIS — Z923 Personal history of irradiation: Secondary | ICD-10-CM | POA: Diagnosis not present

## 2018-07-13 DIAGNOSIS — F419 Anxiety disorder, unspecified: Secondary | ICD-10-CM

## 2018-07-13 DIAGNOSIS — Z9989 Dependence on other enabling machines and devices: Secondary | ICD-10-CM

## 2018-07-13 DIAGNOSIS — R5383 Other fatigue: Secondary | ICD-10-CM

## 2018-07-13 DIAGNOSIS — I10 Essential (primary) hypertension: Secondary | ICD-10-CM

## 2018-07-13 DIAGNOSIS — R05 Cough: Secondary | ICD-10-CM

## 2018-07-13 DIAGNOSIS — R519 Headache, unspecified: Secondary | ICD-10-CM

## 2018-07-13 DIAGNOSIS — R5381 Other malaise: Secondary | ICD-10-CM

## 2018-07-13 DIAGNOSIS — Z7982 Long term (current) use of aspirin: Secondary | ICD-10-CM

## 2018-07-13 DIAGNOSIS — Z95828 Presence of other vascular implants and grafts: Secondary | ICD-10-CM

## 2018-07-13 DIAGNOSIS — Z79899 Other long term (current) drug therapy: Secondary | ICD-10-CM

## 2018-07-13 DIAGNOSIS — G4733 Obstructive sleep apnea (adult) (pediatric): Secondary | ICD-10-CM

## 2018-07-13 DIAGNOSIS — R197 Diarrhea, unspecified: Secondary | ICD-10-CM

## 2018-07-13 DIAGNOSIS — Z87891 Personal history of nicotine dependence: Secondary | ICD-10-CM

## 2018-07-13 LAB — COMPREHENSIVE METABOLIC PANEL
ALT: 13 U/L (ref 0–44)
AST: 28 U/L (ref 15–41)
Albumin: 2.9 g/dL — ABNORMAL LOW (ref 3.5–5.0)
Alkaline Phosphatase: 65 U/L (ref 38–126)
Anion gap: 11 (ref 5–15)
BILIRUBIN TOTAL: 0.7 mg/dL (ref 0.3–1.2)
BUN: 17 mg/dL (ref 6–20)
CALCIUM: 10.2 mg/dL (ref 8.9–10.3)
CHLORIDE: 93 mmol/L — AB (ref 98–111)
CO2: 27 mmol/L (ref 22–32)
CREATININE: 0.92 mg/dL (ref 0.61–1.24)
GFR calc Af Amer: >60 mL/min (ref >60)
Glucose, Bld: 95 mg/dL (ref 70–99)
Potassium: 3.9 mmol/L (ref 3.5–5.1)
Sodium: 131 mmol/L — ABNORMAL LOW (ref 135–145)
Total Protein: 6 g/dL — ABNORMAL LOW (ref 6.5–8.1)

## 2018-07-13 LAB — CBC WITH DIFFERENTIAL/PLATELET
Abs Immature Granulocytes: 0.02 10*3/uL (ref 0.00–0.07)
Basophils Absolute: 0 10*3/uL (ref 0.0–0.1)
Basophils Relative: 0 %
EOS PCT: 1 %
Eosinophils Absolute: 0.1 10*3/uL (ref 0.0–0.5)
HEMATOCRIT: 29.5 % — AB (ref 39.0–52.0)
HEMOGLOBIN: 9.6 g/dL — AB (ref 13.0–17.0)
Immature Granulocytes: 0 %
Lymphocytes Relative: 16 %
Lymphs Abs: 1.1 10*3/uL (ref 0.7–4.0)
MCH: 27.9 pg (ref 26.0–34.0)
MCHC: 32.5 g/dL (ref 30.0–36.0)
MCV: 85.8 fL (ref 80.0–100.0)
MONO ABS: 0.6 10*3/uL (ref 0.1–1.0)
Monocytes Relative: 8 %
Neutro Abs: 5.1 10*3/uL (ref 1.7–7.7)
Neutrophils Relative %: 75 %
Platelets: 172 10*3/uL (ref 150–400)
RBC: 3.44 MIL/uL — AB (ref 4.22–5.81)
RDW: 14.1 % (ref 11.5–15.5)
WBC: 7 10*3/uL (ref 4.0–10.5)
nRBC: 0 % (ref 0.0–0.2)

## 2018-07-13 MED ORDER — OXYCODONE-ACETAMINOPHEN 5-325 MG PO TABS: 1.0000 | ORAL_TABLET | Freq: Two times a day (BID) | ORAL | 0 refills | Status: DC | PRN

## 2018-07-13 MED ORDER — MORPHINE SULFATE ER 15 MG PO TBCR: 15.0000 mg | EXTENDED_RELEASE_TABLET | ORAL | 0 refills | Status: DC | PRN

## 2018-07-13 MED ORDER — HEPARIN SOD (PORK) LOCK FLUSH 100 UNIT/ML IV SOLN
500.0000 [IU] | Freq: Once | INTRAVENOUS | Status: AC
  Administered 2018-07-13: 500 [IU] via INTRAVENOUS
  Filled 2018-07-13: qty 5

## 2018-07-13 MED ORDER — SODIUM CHLORIDE 0.9% FLUSH
10.0000 mL | Freq: Once | INTRAVENOUS | Status: AC
  Administered 2018-07-13: 10 mL via INTRAVENOUS
  Filled 2018-07-13: qty 10

## 2018-07-13 NOTE — Progress Notes (Signed)
ON PATHWAY REGIMEN - Non-Small Cell Lung  No Change  Continue With Treatment as Ordered.     A cycle is every 21 days:     Paclitaxel      Carboplatin      Bevacizumab   **Always confirm dose/schedule in your pharmacy ordering system**  Patient Characteristics: Stage IV Metastatic, Nonsquamous, Initial Chemotherapy/Immunotherapy, PS = 0, 1, PD-L1 Expression Positive 1-49% (TPS) / Negative / Not Tested / Awaiting Test Results AJCC T Category: T4 Current Disease Status: Distant Metastases AJCC N Category: N0 AJCC M Category: M1 AJCC 8 Stage Grouping: IV Histology: Nonsquamous Cell ROS1 Rearrangement Status: Awaiting Test Results T790M Mutation Status: Not Applicable - EGFR Mutation Negative/Unknown Other Mutations/Biomarkers: No Other Actionable Mutations NTRK Gene Fusion Status: Awaiting Test Results PD-L1 Expression Status: Awaiting Test Results Chemotherapy/Immunotherapy LOT: Initial Chemotherapy/Immunotherapy Molecular Targeted Therapy: Not Appropriate ALK Translocation Status: Awaiting Test Results EGFR Mutation Status: Awaiting Test Results BRAF V600E Mutation Status: Awaiting Test Results Performance Status: PS = 0, 1 Intent of Therapy: Non-Curative / Palliative Intent, Discussed with Patient

## 2018-07-13 NOTE — Progress Notes (Signed)
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 [EGFR MUTATED; exon 19; clinically non-small cell];Dr.Jeffrey Clark; II opinion at [facility];  # June 17th- carbo-taxol #1; PROGRESSION June 26th 2019- Osimertinib '80mg'$ /day; S/p palliative RT [facility]; June 28th, 2019]  # HTN/ cryptogenic stroke  # MOLECULAR TESTING- EGFR exon 19 deletion [omniseq]    DIAGNOSIS: non-small cell lung ca  STAGE:  IV       ;GOALS: palliative  CURRENT/MOST RECENT THERAPY- June 26th OSIMERTINIB       Primary cancer of right lower lobe of lung ([place])   07/12/2018 -  Chemotherapy    The patient had bevacizumab (AVASTIN) 1,075 mg in sodium chloride 0.9 % 100 mL chemo infusion, 15 mg/kg = 1,075 mg (original dose ), Intravenous, Every 21 days, 0 of 6 cycles Dose modification: 15 mg/kg (Cycle 1, Reason: Provider Judgment) atezolizumab (TECENTRIQ) 1,200 mg in sodium chloride 0.9 % 250 mL chemo infusion, 1,200 mg, Intravenous, Once, 0 of 6 cycles  for chemotherapy treatment.        INTERVAL HISTORY:  CONNY MOENING 47 y.o.  male pleasant patient above history of metastatic adenocarcinoma the lung/EGFR mutated is currently osimertinib is here for follow-up/review these of the CT scan.  Patient notes to have progressive pain in the right chest wall.  Also progressive shortness of breath.  Positive for cough at nighttime.  He continues to use MS Contin/Percocet and also liquid morphine for pain control.  Also complains of headaches. Progressive.  Poor appetite.  Weight loss.  Review of  Systems  Constitutional: Positive for malaise/fatigue. Negative for chills, diaphoresis, fever and weight loss.  HENT: Negative for nosebleeds and sore throat.   Eyes: Negative for double vision.  Respiratory: Positive for cough and shortness of breath. Negative for hemoptysis, sputum production and wheezing.   Cardiovascular: Positive for chest pain (Right chest wall pain.). Negative for palpitations, orthopnea and leg swelling.  Gastrointestinal: Positive for diarrhea. Negative for blood in stool, constipation, heartburn, melena, nausea and vomiting.  Genitourinary: Negative for dysuria, frequency and urgency.  Musculoskeletal: Negative for back pain and joint pain.  Skin: Negative.  Negative for itching and rash.  Neurological: Negative for dizziness, tingling, focal weakness, weakness and headaches.  Endo/Heme/Allergies: Does not bruise/bleed easily.  Psychiatric/Behavioral: Negative for depression. The patient is not nervous/anxious and does not have insomnia.       PAST MEDICAL HISTORY :  Past Medical History:  Diagnosis Date  . Cryptogenic stroke ([place]) 08/2016   a. 09/15/2016 in setting of PFO.  . CVA (cerebral vascular accident) ([place]) 09/07/2016  . ED (erectile dysfunction)   . Essential (primary) hypertension 02/01/2016  . History of stroke 10/08/2016  . Hx of insomnia   . Hypertension   . OSA on CPAP   . PFO (patent foramen ovale)    a. s/p closure 09/2016 by Dr. [name].  . Pleural effusion on right 02/21/2018  . Primary cancer of right lower lobe of lung ([place]) 02/25/2018   Chemo tx's  . PVC's (premature ventricular contractions) 10/08/2016  . Quadriceps tendinitis 02/01/2016  Superior spurring   . TIA (transient ischemic attack) 09/07/2016    PAST SURGICAL HISTORY :   Past Surgical History:  Procedure Laterality Date  . APPENDECTOMY  1980  . EYE SURGERY    . PATENT FORAMEN OVALE CLOSURE  10/07/2016  . PATENT FORAMEN OVALE CLOSURE N/A 10/07/2016   Procedure: Patent  Forament [place](PFO) Closure;  Surgeon: [name], MD;  Location: [place] [facility];  Service: Cardiovascular;  Laterality: N/A;  . PORTACATH PLACEMENT N/A 02/23/2018   Procedure: INSERTION PORT-A-CATH;  Surgeon: Nestor Lewandowsky, MD;  Location: [facility] ORS;  Service: Thoracic;  Laterality: N/A;  . STRABISMUS SURGERY Left 1984  . TEE WITHOUT CARDIOVERSION N/A 09/08/2016   Procedure: TRANSESOPHAGEAL ECHOCARDIOGRAM (TEE);  Surgeon: [name], MD;  Location: [facility] ORS;  Service: Cardiovascular;  Laterality: N/A;  . VIDEO ASSISTED THORACOSCOPY (VATS) W/TALC PLEUADESIS Right 02/23/2018   Procedure: VIDEO ASSISTED THORACOSCOPY (VATS) W/TALC PLEUADESIS;  Surgeon: Nestor Lewandowsky, MD;  Location: [facility] ORS;  Service: Thoracic;  Laterality: Right;  . VITRECTOMY Right 2013    FAMILY HISTORY :   Family History  Problem Relation Age of Onset  . Other Paternal Uncle        Abdominal tumor     SOCIAL HISTORY:   Social History   Tobacco Use  . Smoking status: Former Smoker    Packs/day: 0.50    Years: 10.00    Pack years: 5.00    Last attempt to quit: 2011    Years since quitting: 8.8  . Smokeless tobacco: Former Systems developer    Types: Snuff  Substance Use Topics  . Alcohol use: Yes    Alcohol/week: 3.0 standard drinks    Types: 3 Cans of beer per week  . Drug use: No    ALLERGIES:  has No Known Allergies.  MEDICATIONS:  Current Outpatient Medications  Medication Sig Dispense Refill  . acetaminophen (TYLENOL) 325 MG tablet Take 2 tablets (650 mg total) by mouth every 6 (six) hours as needed for mild pain (or Fever >/= 101).    Marland Kitchen aspirin EC 81 MG EC tablet Take 1 tablet (81 mg total) by mouth daily. 120 tablet 0  . B Complex-C (B-COMPLEX WITH VITAMIN C) tablet Take 1 tablet by mouth daily.    . Diclofenac Sodium 2 % SOLN Apply 1 pump twice daily as needed. 112 g 3  . furosemide (LASIX) 20 MG tablet Take 1 tablet (20 mg total) by mouth daily as needed for edema. 30 tablet 6  . ibuprofen  (ADVIL,MOTRIN) 200 MG tablet Take 800 mg by mouth daily as needed for mild pain.    Marland Kitchen lidocaine-prilocaine (EMLA) cream Apply 1 application topically as needed. Apply generously over the Mediport 45 minutes prior to chemotherapy. 30 g 0  . loratadine (CLARITIN) 10 MG tablet Take 10 mg by mouth daily as needed for allergies.     Marland Kitchen LORazepam (ATIVAN) 0.5 MG tablet Take 1 tablet (0.5 mg total) by mouth every 8 (eight) hours as needed for anxiety. 45 tablet 0  . metoprolol tartrate (LOPRESSOR) 25 MG tablet Take 1 tablet (25 mg total) by mouth 2 (two) times daily. 60 tablet 2  . morphine (MS CONTIN) 15 MG 12 hr tablet Take 1 tablet (15 mg total) by mouth as needed for pain. 60 tablet 0  . Morphine Sulfate (MORPHINE CONCENTRATE) 10 mg / 0.5 ml concentrated solution Take 0.5 mLs (10 mg total) by mouth every 12 (twelve) hours as needed for severe pain. 118 mL 0  .  Multiple Vitamin (MULTIVITAMIN WITH MINERALS) TABS tablet Take 1 tablet by mouth daily.    Marland Kitchen osimertinib mesylate (TAGRISSO) 80 MG tablet Take 1 tablet (80 mg total) by mouth daily. 30 tablet 5  . oxyCODONE-acetaminophen (PERCOCET) 5-325 MG tablet Take 1 tablet by mouth every 12 (twelve) hours as needed for severe pain. 60 tablet 0  . prochlorperazine (COMPAZINE) 10 MG tablet Take 1 tablet (10 mg total) by mouth every 6 (six) hours as needed (Nausea or vomiting). 30 tablet 1  . tadalafil (CIALIS) 5 MG tablet Take 5 mg by mouth daily as needed for erectile dysfunction.    . triamcinolone (NASACORT) 55 MCG/ACT AERO nasal inhaler Place 2 sprays into the nose daily as needed (congestion).     . Vitamin D, Ergocalciferol, (DRISDOL) 50000 units CAPS capsule Take 1 capsule (50,000 Units total) by mouth every 7 (seven) days. (Patient taking differently: Take 50,000 Units by mouth every 7 (seven) days. Mondays) 12 capsule 0  . zolpidem (AMBIEN) 10 MG tablet Take 10 mg by mouth at bedtime as needed for sleep.     Marland Kitchen dicyclomine (BENTYL) 10 MG capsule Take 1  capsule (10 mg total) by mouth 4 (four) times daily -  before meals and at bedtime. (Patient not taking: Reported on 07/02/2018) 40 capsule 3   No current facility-administered medications for this visit.     PHYSICAL EXAMINATION: ECOG PERFORMANCE STATUS: 1 - Symptomatic but completely ambulatory  BP 112/68 (BP Location: Left Arm, Patient Position: Sitting)   Pulse (!) 114   Temp 97.6 F (36.4 C) (Oral)   Resp 18   Wt 158 lb 3.2 oz (71.8 kg)   BMI 25.53 kg/m   Filed Weights   07/13/18 1431  Weight: 158 lb 3.2 oz (71.8 kg)    Physical Exam  Constitutional: He is oriented to person, place, and time and well-developed, well-nourished, and in no distress.  HENT:  Head: Normocephalic and atraumatic.  Mouth/Throat: Oropharynx is clear and moist. No oropharyngeal exudate.  Eyes: Pupils are equal, round, and reactive to light.  Neck: Normal range of motion. Neck supple.  Cardiovascular: Normal rate and regular rhythm.  Pulmonary/Chest: No respiratory distress. He has no wheezes.  Decreased breath sounds right side most of the bases.  Right chest wall approximately 2-3 cm "KNOT"; mid-axillary line.   Abdominal: Soft. Bowel sounds are normal. He exhibits no distension and no mass. There is no tenderness. There is no rebound and no guarding.  Musculoskeletal: Normal range of motion. He exhibits no edema or tenderness.  Neurological: He is alert and oriented to person, place, and time.  Skin: Skin is warm.  Psychiatric: Affect normal.       LABORATORY DATA:  I have reviewed the data as listed    Component Value Date/Time   NA 131 (L) 07/13/2018 1411   NA 141 10/05/2016 0848   K 3.9 07/13/2018 1411   K 4.3 05/15/2013 0612   CL 93 (L) 07/13/2018 1411   CO2 27 07/13/2018 1411   GLUCOSE 95 07/13/2018 1411   BUN 17 07/13/2018 1411   BUN 17 10/05/2016 0848   CREATININE 0.92 07/13/2018 1411   CALCIUM 10.2 07/13/2018 1411   PROT 6.0 (L) 07/13/2018 1411   ALBUMIN 2.9 (L)  07/13/2018 1411   AST 28 07/13/2018 1411   ALT 13 07/13/2018 1411   ALKPHOS 65 07/13/2018 1411   BILITOT 0.7 07/13/2018 1411   GFRNONAA >60 07/13/2018 1411   GFRAA >60 07/13/2018 1411  No results found for: SPEP, UPEP  Lab Results  Component Value Date   WBC 7.0 07/13/2018   NEUTROABS 5.1 07/13/2018   HGB 9.6 (L) 07/13/2018   HCT 29.5 (L) 07/13/2018   MCV 85.8 07/13/2018   PLT 172 07/13/2018      Chemistry      Component Value Date/Time   NA 131 (L) 07/13/2018 1411   NA 141 10/05/2016 0848   K 3.9 07/13/2018 1411   K 4.3 05/15/2013 0612   CL 93 (L) 07/13/2018 1411   CO2 27 07/13/2018 1411   BUN 17 07/13/2018 1411   BUN 17 10/05/2016 0848   CREATININE 0.92 07/13/2018 1411      Component Value Date/Time   CALCIUM 10.2 07/13/2018 1411   ALKPHOS 65 07/13/2018 1411   AST 28 07/13/2018 1411   ALT 13 07/13/2018 1411   BILITOT 0.7 07/13/2018 1411       RADIOGRAPHIC STUDIES: I have personally reviewed the radiological images as listed and agreed with the findings in the report. Ct Chest W Contrast  Result Date: 07/12/2018 CLINICAL DATA:  Patient with history of lung cancer. Restaging exam. EXAM: CT CHEST, ABDOMEN, AND PELVIS WITH CONTRAST TECHNIQUE: Multidetector CT imaging of the chest, abdomen and pelvis was performed following the standard protocol during bolus administration of intravenous contrast. CONTRAST:  184m ISOVUE-300 IOPAMIDOL (ISOVUE-300) INJECTION 61% COMPARISON:  CT chest 05/08/2018; CT chest 03/21/2018; PET-CT 03/01/2018. FINDINGS: CT CHEST FINDINGS Cardiovascular: Right anterior chest wall Port-A-Cath is present with tip terminating in the superior vena cava. Leftward shift of the mediastinum. Normal heart size. Small amount of pericardial fluid. Mediastinum/Nodes: Similar 1.4 cm superior mediastinal (image 13; series 2) lymph node. Leftward shift of mediastinum. Normal appearance of the esophagus. Lungs/Pleura: Central airways are patent. When compared to  prior exam there has been significant interval increase in heterogeneously enhancing tumor throughout the majority of the right hemithorax involving the lung and pleura. Minimal amount of aerated right upper lung. More inferiorly within the right hemithorax tumor burden measures up to 25 x 21 cm, previously 12 x 7 cm. Tumor impresses upon and appears to invade at least the posterior aspect of the mediastinum. There is tumor involving the right lateral chest wall measuring up to 3.4 x 5.3 cm (image 42; series 2). There is a right lateral chest wall nodule measuring 2.8 x 2.2 cm (image 52; series 2), previously 1.5 x 1.0 cm. Similar appearing tiny nodules within the left upper lobe. Musculoskeletal: No aggressive or acute appearing osseous lesions. CT ABDOMEN PELVIS FINDINGS Hepatobiliary: The liver is normal in size and contour. No focal hepatic lesions identified. Gallbladder is unremarkable. Pancreas: Unremarkable Spleen: Unremarkable Adrenals/Urinary Tract: Normal adrenal glands. Kidneys enhance symmetrically with contrast. Urinary bladder is unremarkable. Stomach/Bowel: No abnormal bowel wall thickening or evidence for bowel obstruction. No free fluid or free intraperitoneal air. Vascular/Lymphatic: Normal caliber abdominal aorta. No retroperitoneal lymphadenopathy. Reproductive: Unremarkable Other: None Musculoskeletal: Lumbar spine degenerative changes. No aggressive or acute appearing osseous lesions. IMPRESSION: Significant interval increase in size of tumor throughout the right hemithorax which now involves the lateral aspect of the right chest wall and right chest wall soft tissues. Increased leftward shift of the mediastinal structures. Electronically Signed   By: DLovey NewcomerM.D.   On: 07/12/2018 11:28   Ct Abdomen Pelvis W Contrast  Result Date: 07/12/2018 CLINICAL DATA:  Patient with history of lung cancer. Restaging exam. EXAM: CT CHEST, ABDOMEN, AND PELVIS WITH CONTRAST TECHNIQUE: Multidetector  CT imaging of  the chest, abdomen and pelvis was performed following the standard protocol during bolus administration of intravenous contrast. CONTRAST:  1101m ISOVUE-300 IOPAMIDOL (ISOVUE-300) INJECTION 61% COMPARISON:  CT chest 05/08/2018; CT chest 03/21/2018; PET-CT 03/01/2018. FINDINGS: CT CHEST FINDINGS Cardiovascular: Right anterior chest wall Port-A-Cath is present with tip terminating in the superior vena cava. Leftward shift of the mediastinum. Normal heart size. Small amount of pericardial fluid. Mediastinum/Nodes: Similar 1.4 cm superior mediastinal (image 13; series 2) lymph node. Leftward shift of mediastinum. Normal appearance of the esophagus. Lungs/Pleura: Central airways are patent. When compared to prior exam there has been significant interval increase in heterogeneously enhancing tumor throughout the majority of the right hemithorax involving the lung and pleura. Minimal amount of aerated right upper lung. More inferiorly within the right hemithorax tumor burden measures up to 25 x 21 cm, previously 12 x 7 cm. Tumor impresses upon and appears to invade at least the posterior aspect of the mediastinum. There is tumor involving the right lateral chest wall measuring up to 3.4 x 5.3 cm (image 42; series 2). There is a right lateral chest wall nodule measuring 2.8 x 2.2 cm (image 52; series 2), previously 1.5 x 1.0 cm. Similar appearing tiny nodules within the left upper lobe. Musculoskeletal: No aggressive or acute appearing osseous lesions. CT ABDOMEN PELVIS FINDINGS Hepatobiliary: The liver is normal in size and contour. No focal hepatic lesions identified. Gallbladder is unremarkable. Pancreas: Unremarkable Spleen: Unremarkable Adrenals/Urinary Tract: Normal adrenal glands. Kidneys enhance symmetrically with contrast. Urinary bladder is unremarkable. Stomach/Bowel: No abnormal bowel wall thickening or evidence for bowel obstruction. No free fluid or free intraperitoneal air. Vascular/Lymphatic:  Normal caliber abdominal aorta. No retroperitoneal lymphadenopathy. Reproductive: Unremarkable Other: None Musculoskeletal: Lumbar spine degenerative changes. No aggressive or acute appearing osseous lesions. IMPRESSION: Significant interval increase in size of tumor throughout the right hemithorax which now involves the lateral aspect of the right chest wall and right chest wall soft tissues. Increased leftward shift of the mediastinal structures. Electronically Signed   By: DLovey NewcomerM.D.   On: 07/12/2018 11:28     ASSESSMENT & PLAN:  [place] #Non-small cell lung cancer/stage IV-EGFR mutated exon 19-currently on osimertinib since June 26th 2019; October 17th CT scan shows significant progression of disease involving the right hemithorax.   #Given the rapidly progressive malignancy; I recommend switching the treatment to-carbotaxol Avastin Tecentriq every 3 weeks.  #Also recommend plasma testing-drawn today/molecular testing; and also will need repeat biopsy.  Plan biopsy in the next 7 to 10 days.  Discussed the potential side effects including but not limited to-increasing fatigue, nausea vomiting, diarrhea, hair loss, sores in the mouth, increase risk of infection and also neuropathy.    I discussed the mechanism of action; The goal of therapy is palliative; and length of treatments are likely ongoing/based upon the results of the scans. Discussed the potential side effects of immunotherapy including but not limited to diarrhea; skin rash; elevated LFTs/endocrine abnormalities etc.  Reviewed the rationale for using Avastin. Discussed the potential side effects including but not limited to elevated blood pressure ; nephrotic syndrome wound healing problems.  # Chest wall pain second malignancy; stable.  Morphine 6-8 mg qhs as needed; continue Percocet 5 /325 every 12 hours as needed/MS Contin; new prescription given.   # Insomnia/anxiety continue Ativan  prn.  # 40 minutes face-to-face with the patient discussing the above plan of care; more than 50% of time spent on prognosis/ natural history;  counseling and coordination. Discussed with Dr[facility]Clarke at [facility]- who also endorses the above plan.   # Disposition: # STAT MRI brain # carbo-taxol-avastin-tecentriq next week; no labs # follow up in 2 weeks;labs- cbc/bmp- Dr.B   Orders Placed This Encounter  Procedures  . MR Brain W Wo Contrast    Standing Status:   Future    Standing Expiration Date:   07/13/2019    Order Specific Question:   ** REASON FOR EXAM (FREE TEXT)    Answer:   headaches; lung cancer    Order Specific Question:   If indicated for the ordered procedure, I authorize the administration of contrast media per Radiology protocol    Answer:   Yes    Order Specific Question:   What is the patient's sedation requirement?    Answer:   No Sedation    Order Specific Question:   Does the patient have a pacemaker or implanted devices?    Answer:   No    Order Specific Question:   Use SRS Protocol?    Answer:   No    Order Specific Question:   Radiology Contrast Protocol - do NOT remove file path    Answer:   [email or web address]    Order Specific Question:   Preferred imaging location?    Answer:   [facility] (table limit-300lbs)   All questions were answered. The patient knows to call the clinic with any problems, questions or concerns.      Cammie Sickle, MD 07/13/2018 4:14 PM

## 2018-07-13 NOTE — Assessment & Plan Note (Addendum)
#  Non-small cell lung cancer/stage IV-EGFR mutated exon 19-currently on osimertinib since June 26th 2019; October 17th CT scan shows significant progression of disease involving the right hemithorax.   #Given the rapidly progressive malignancy; I recommend switching the treatment to-carbotaxol Avastin Tecentriq every 3 weeks.  #Also recommend plasma testing-drawn today/molecular testing; and also will need repeat biopsy.  Plan biopsy in the next 7 to 10 days.  Discussed the potential side effects including but not limited to-increasing fatigue, nausea vomiting, diarrhea, hair loss, sores in the mouth, increase risk of infection and also neuropathy.    I discussed the mechanism of action; The goal of therapy is palliative; and length of treatments are likely ongoing/based upon the results of the scans. Discussed the potential side effects of immunotherapy including but not limited to diarrhea; skin rash; elevated LFTs/endocrine abnormalities etc.  Reviewed the rationale for using Avastin. Discussed the potential side effects including but not limited to elevated blood pressure ; nephrotic syndrome wound healing problems.  # Chest wall pain second malignancy; stable.  Morphine 6-8 mg qhs as needed; continue Percocet 5 /325 every 12 hours as needed/MS Contin; new prescription given.   # Insomnia/anxiety continue Ativan prn.  # 40 minutes face-to-face with the patient discussing the above plan of care; more than 50% of time spent on prognosis/ natural history; counseling and coordination. Discussed with Dr.Clarke at [facility]- who also endorses the above plan.   # Disposition: # STAT MRI brain # carbo-taxol-avastin-tecentriq next week; no labs # follow up in 2 weeks;labs- cbc/bmp- Dr.B

## 2018-07-15 ENCOUNTER — Ambulatory Visit
Admission: RE | Admit: 2018-07-15 | Discharge: 2018-07-15 | Disposition: A | Discharge status: Home / Self Care | Payer: No Typology Code available for payment source | Source: Ambulatory Visit | Attending: Internal Medicine | Admitting: Internal Medicine

## 2018-07-15 DIAGNOSIS — I10 Essential (primary) hypertension: Secondary | ICD-10-CM | POA: Insufficient documentation

## 2018-07-15 DIAGNOSIS — C3431 Malignant neoplasm of lower lobe, right bronchus or lung: Secondary | ICD-10-CM | POA: Diagnosis not present

## 2018-07-15 DIAGNOSIS — R519 Headache, unspecified: Secondary | ICD-10-CM

## 2018-07-15 DIAGNOSIS — R51 Headache: Secondary | ICD-10-CM | POA: Diagnosis present

## 2018-07-15 MED ORDER — GADOBUTROL 1 MMOL/ML IV SOLN
7.0000 mL | Freq: Once | INTRAVENOUS | Status: AC | PRN
  Administered 2018-07-15: 7 mL via INTRAVENOUS

## 2018-07-16 ENCOUNTER — Other Ambulatory Visit: Payer: Self-pay

## 2018-07-16 ENCOUNTER — Other Ambulatory Visit: Payer: Self-pay | Admitting: Internal Medicine

## 2018-07-16 ENCOUNTER — Ambulatory Visit: Payer: Self-pay | Admitting: Internal Medicine

## 2018-07-16 ENCOUNTER — Telehealth: Payer: Self-pay | Admitting: Internal Medicine

## 2018-07-16 NOTE — Telephone Encounter (Signed)
Mychart message sent.

## 2018-07-17 ENCOUNTER — Other Ambulatory Visit: Payer: Self-pay | Admitting: Internal Medicine

## 2018-07-17 ENCOUNTER — Telehealth: Payer: Self-pay | Admitting: Internal Medicine

## 2018-07-17 NOTE — Progress Notes (Signed)
Orders signed.

## 2018-07-17 NOTE — Telephone Encounter (Signed)
At 3:45-I spoke to Oncologist from insurance- approves the chemo regimen; states cannot give to me the auth #; however will pass on his recommendation to the insurance company.   FYI-

## 2018-07-18 ENCOUNTER — Inpatient Hospital Stay: Payer: No Typology Code available for payment source

## 2018-07-18 VITALS — BP 108/71 | HR 87 | Temp 95.6°F | Resp 20 | Wt 158.0 lb

## 2018-07-18 DIAGNOSIS — Z7189 Other specified counseling: Secondary | ICD-10-CM

## 2018-07-18 DIAGNOSIS — C3431 Malignant neoplasm of lower lobe, right bronchus or lung: Secondary | ICD-10-CM | POA: Diagnosis not present

## 2018-07-18 LAB — PROTEIN, URINE, RANDOM: Total Protein, Urine: 12 mg/dL

## 2018-07-18 MED ORDER — HEPARIN SOD (PORK) LOCK FLUSH 100 UNIT/ML IV SOLN
500.0000 [IU] | Freq: Once | INTRAVENOUS | Status: AC | PRN
  Administered 2018-07-18: 500 [IU]
  Filled 2018-07-18: qty 5

## 2018-07-18 MED ORDER — SODIUM CHLORIDE 0.9% FLUSH
10.0000 mL | INTRAVENOUS | Status: DC | PRN
  Filled 2018-07-18: qty 10

## 2018-07-18 MED ORDER — SODIUM CHLORIDE 0.9 % IV SOLN
20.0000 mg | Freq: Once | INTRAVENOUS | Status: AC
  Administered 2018-07-18: 20 mg via INTRAVENOUS
  Filled 2018-07-18: qty 2

## 2018-07-18 MED ORDER — SODIUM CHLORIDE 0.9 % IV SOLN
1000.0000 mg | INTRAVENOUS | Status: DC
  Administered 2018-07-18: 1000 mg via INTRAVENOUS
  Filled 2018-07-18: qty 40

## 2018-07-18 MED ORDER — SODIUM CHLORIDE 0.9 % IV SOLN: 819.0000 mg | Freq: Once | INTRAVENOUS | Status: DC

## 2018-07-18 MED ORDER — FAMOTIDINE IN NACL 20-0.9 MG/50ML-% IV SOLN
20.0000 mg | Freq: Once | INTRAVENOUS | Status: AC
  Administered 2018-07-18: 20 mg via INTRAVENOUS
  Filled 2018-07-18: qty 50

## 2018-07-18 MED ORDER — SODIUM CHLORIDE 0.9 % IV SOLN
700.0000 mg | Freq: Once | INTRAVENOUS | Status: AC
  Administered 2018-07-18: 700 mg via INTRAVENOUS
  Filled 2018-07-18: qty 70

## 2018-07-18 MED ORDER — INFLUENZA VAC SPLIT QUAD 0.5 ML IM SUSY
0.5000 mL | PREFILLED_SYRINGE | Freq: Once | INTRAMUSCULAR | Status: AC
  Administered 2018-07-18: 0.5 mL via INTRAMUSCULAR
  Filled 2018-07-18: qty 0.5

## 2018-07-18 MED ORDER — DIPHENHYDRAMINE HCL 50 MG/ML IJ SOLN
50.0000 mg | Freq: Once | INTRAMUSCULAR | Status: AC
  Administered 2018-07-18: 50 mg via INTRAVENOUS
  Filled 2018-07-18: qty 1

## 2018-07-18 MED ORDER — SODIUM CHLORIDE 0.9 % IV SOLN
1200.0000 mg | Freq: Once | INTRAVENOUS | Status: AC
  Administered 2018-07-18: 1200 mg via INTRAVENOUS
  Filled 2018-07-18: qty 20

## 2018-07-18 MED ORDER — PALONOSETRON HCL INJECTION 0.25 MG/5ML
0.2500 mg | Freq: Once | INTRAVENOUS | Status: AC
  Administered 2018-07-18: 0.25 mg via INTRAVENOUS
  Filled 2018-07-18: qty 5

## 2018-07-18 MED ORDER — SODIUM CHLORIDE 0.9 % IV SOLN
Freq: Once | INTRAVENOUS | Status: AC
  Administered 2018-07-18: 09:00:00 via INTRAVENOUS
  Filled 2018-07-18: qty 250

## 2018-07-18 MED ORDER — SODIUM CHLORIDE 0.9 % IV SOLN
200.0000 mg/m2 | Freq: Once | INTRAVENOUS | Status: AC
  Administered 2018-07-18: 384 mg via INTRAVENOUS
  Filled 2018-07-18: qty 64

## 2018-07-20 ENCOUNTER — Telehealth: Payer: Self-pay | Admitting: Internal Medicine

## 2018-07-20 ENCOUNTER — Other Ambulatory Visit: Payer: Self-pay | Admitting: Internal Medicine

## 2018-07-20 ENCOUNTER — Encounter: Payer: Self-pay | Admitting: Internal Medicine

## 2018-07-20 ENCOUNTER — Telehealth: Payer: Self-pay | Admitting: *Deleted

## 2018-07-20 DIAGNOSIS — Z7189 Other specified counseling: Secondary | ICD-10-CM

## 2018-07-20 DIAGNOSIS — C3431 Malignant neoplasm of lower lobe, right bronchus or lung: Secondary | ICD-10-CM

## 2018-07-20 MED ORDER — PROCHLORPERAZINE MALEATE 10 MG PO TABS: 10.0000 mg | ORAL_TABLET | Freq: Four times a day (QID) | ORAL | 1 refills | Status: DC | PRN

## 2018-07-20 NOTE — Telephone Encounter (Signed)
Patient requesting Compazine refill

## 2018-07-20 NOTE — Telephone Encounter (Signed)
Spoke to [employer] testing- provided necessary information.

## 2018-07-23 MED ORDER — LORAZEPAM 0.5 MG PO TABS: 0.5000 mg | ORAL_TABLET | Freq: Three times a day (TID) | ORAL | 0 refills | Status: DC | PRN

## 2018-07-27 ENCOUNTER — Encounter: Payer: Self-pay | Admitting: Internal Medicine

## 2018-07-27 ENCOUNTER — Inpatient Hospital Stay (HOSPITAL_BASED_OUTPATIENT_CLINIC_OR_DEPARTMENT_OTHER): Payer: No Typology Code available for payment source | Admitting: Internal Medicine

## 2018-07-27 ENCOUNTER — Other Ambulatory Visit: Payer: Self-pay

## 2018-07-27 ENCOUNTER — Inpatient Hospital Stay: Payer: No Typology Code available for payment source | Attending: Internal Medicine

## 2018-07-27 VITALS — BP 126/81 | HR 84 | Temp 97.7°F | Resp 18 | Wt 153.0 lb

## 2018-07-27 DIAGNOSIS — C3431 Malignant neoplasm of lower lobe, right bronchus or lung: Secondary | ICD-10-CM | POA: Insufficient documentation

## 2018-07-27 DIAGNOSIS — Z8673 Personal history of transient ischemic attack (TIA), and cerebral infarction without residual deficits: Secondary | ICD-10-CM | POA: Diagnosis not present

## 2018-07-27 DIAGNOSIS — Z87891 Personal history of nicotine dependence: Secondary | ICD-10-CM | POA: Insufficient documentation

## 2018-07-27 DIAGNOSIS — Z791 Long term (current) use of non-steroidal anti-inflammatories (NSAID): Secondary | ICD-10-CM

## 2018-07-27 DIAGNOSIS — Z9981 Dependence on supplemental oxygen: Secondary | ICD-10-CM | POA: Insufficient documentation

## 2018-07-27 DIAGNOSIS — Z7982 Long term (current) use of aspirin: Secondary | ICD-10-CM

## 2018-07-27 DIAGNOSIS — R5383 Other fatigue: Secondary | ICD-10-CM | POA: Insufficient documentation

## 2018-07-27 DIAGNOSIS — Z79899 Other long term (current) drug therapy: Secondary | ICD-10-CM

## 2018-07-27 DIAGNOSIS — Z5112 Encounter for antineoplastic immunotherapy: Secondary | ICD-10-CM | POA: Insufficient documentation

## 2018-07-27 DIAGNOSIS — G4733 Obstructive sleep apnea (adult) (pediatric): Secondary | ICD-10-CM | POA: Insufficient documentation

## 2018-07-27 DIAGNOSIS — R5381 Other malaise: Secondary | ICD-10-CM | POA: Insufficient documentation

## 2018-07-27 DIAGNOSIS — I10 Essential (primary) hypertension: Secondary | ICD-10-CM | POA: Insufficient documentation

## 2018-07-27 DIAGNOSIS — Z5111 Encounter for antineoplastic chemotherapy: Secondary | ICD-10-CM | POA: Insufficient documentation

## 2018-07-27 DIAGNOSIS — Z7189 Other specified counseling: Secondary | ICD-10-CM

## 2018-07-27 DIAGNOSIS — J90 Pleural effusion, not elsewhere classified: Secondary | ICD-10-CM | POA: Insufficient documentation

## 2018-07-27 DIAGNOSIS — R040 Epistaxis: Secondary | ICD-10-CM | POA: Insufficient documentation

## 2018-07-27 LAB — CBC WITH DIFFERENTIAL/PLATELET
Abs Immature Granulocytes: 0 10*3/uL (ref 0.00–0.07)
BASOS ABS: 0 10*3/uL (ref 0.0–0.1)
Basophils Relative: 0 %
Eosinophils Absolute: 0.1 10*3/uL (ref 0.0–0.5)
Eosinophils Relative: 3 %
HCT: 31.2 % — ABNORMAL LOW (ref 39.0–52.0)
HEMOGLOBIN: 10.1 g/dL — AB (ref 13.0–17.0)
IMMATURE GRANULOCYTES: 0 %
LYMPHS ABS: 0.8 10*3/uL (ref 0.7–4.0)
LYMPHS PCT: 33 %
MCH: 28.1 pg (ref 26.0–34.0)
MCHC: 32.4 g/dL (ref 30.0–36.0)
MCV: 86.9 fL (ref 80.0–100.0)
Monocytes Absolute: 0.2 10*3/uL (ref 0.1–1.0)
Monocytes Relative: 8 %
NEUTROS ABS: 1.4 10*3/uL — AB (ref 1.7–7.7)
NEUTROS PCT: 56 %
Platelets: 114 10*3/uL — ABNORMAL LOW (ref 150–400)
RBC: 3.59 MIL/uL — AB (ref 4.22–5.81)
RDW: 13.6 % (ref 11.5–15.5)
WBC: 2.5 10*3/uL — AB (ref 4.0–10.5)
nRBC: 0 % (ref 0.0–0.2)

## 2018-07-27 LAB — COMPREHENSIVE METABOLIC PANEL
ALT: 20 U/L (ref 0–44)
AST: 31 U/L (ref 15–41)
Albumin: 3 g/dL — ABNORMAL LOW (ref 3.5–5.0)
Alkaline Phosphatase: 74 U/L (ref 38–126)
Anion gap: 8 (ref 5–15)
BUN: 22 mg/dL — ABNORMAL HIGH (ref 6–20)
CHLORIDE: 101 mmol/L (ref 98–111)
CO2: 26 mmol/L (ref 22–32)
Calcium: 9.4 mg/dL (ref 8.9–10.3)
Creatinine, Ser: 0.84 mg/dL (ref 0.61–1.24)
Glucose, Bld: 97 mg/dL (ref 70–99)
POTASSIUM: 3.9 mmol/L (ref 3.5–5.1)
Sodium: 135 mmol/L (ref 135–145)
Total Bilirubin: 0.6 mg/dL (ref 0.3–1.2)
Total Protein: 6.3 g/dL — ABNORMAL LOW (ref 6.5–8.1)

## 2018-07-27 NOTE — Progress Notes (Signed)
Pt in for follow up with MD and labs, weight down 5lbs since the 10/23.  Pt reports fair appetite and some diarrhea but has resolved.

## 2018-07-27 NOTE — Assessment & Plan Note (Addendum)
#  Non-small cell lung cancer/stage IV-EGFR mutated exon 19- October 17th CT scan shows significant progression of disease involving the right hemithorax.   # Currently on carbotaxol Avastin Tecentriq; s/p #1 appx 10 days; tolerated well.   proceed with cycle #2 as planned.  Recent MRI brain negative.  We will plan to have a CT scan after cycle #2.  Discussed that in general the treatment would be for 4 cycles of induction followed by maintenance therapy with Avastin-Tecentriq.   #Reviewed [employer] testing/plasma biopsy-positive for EGFR mutation/already known; no obvious treatable mutations noted.  # Chest wall pain second malignancy; stable.  Morphine 6-8 mg qhs as needed; continue Percocet 5 /325 every 12 hours as needed/MS Contin.   # Insomnia/anxiety continue Ativan prn.  Stable  # CT guided Biopsy- on 11/14- to re-evaluate for small cell transformation.   # DISPOSITION # carbo-taxol- avastin-tecentriq on 11/15-MD/labs- cbc/cmp/UA/  # CT Biopsy lung on 11/14.

## 2018-07-27 NOTE — Progress Notes (Signed)
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    DIAGNOSIS: non-small cell lung ca  STAGE:  IV       ;GOALS: palliative  CURRENT/MOST RECENT THERAPY- June 26th OSIMERTINIB       Primary cancer of right lower lobe of lung ([place])      INTERVAL HISTORY:  Ruben Eaton 47 y.o.  male pleasant patient above history of metastatic adenocarcinoma the lung/EGFR mutated is currently carbotaxol Tecentriq plus Avastin is here for follow-up  Patient received cycle #1 approximately 10 days ago.   Complains of mild nausea without any vomiting.  Appetite is fair.  Denies any worsening pain.  Stable on MS Contin/Percocet liquid morphine.   Review of Systems  Constitutional: Positive for malaise/fatigue. Negative for chills, diaphoresis, fever and weight loss.  HENT: Negative for nosebleeds and sore throat.   Eyes: Negative for double vision.  Respiratory: Positive for cough and shortness of breath. Negative for hemoptysis, sputum production and wheezing.   Cardiovascular: Positive for chest pain (Right chest wall pain.). Negative for palpitations, orthopnea and leg swelling.  Gastrointestinal: Negative for blood in  stool, constipation, heartburn, melena, nausea and vomiting.  Genitourinary: Negative for dysuria, frequency and urgency.  Musculoskeletal: Negative for back pain and joint pain.  Skin: Negative.  Negative for itching and rash.  Neurological: Negative for dizziness, tingling, focal weakness, weakness and headaches.  Endo/Heme/Allergies: Does not bruise/bleed easily.  Psychiatric/Behavioral: Negative for depression. The patient is not nervous/anxious and does not have insomnia.       PAST MEDICAL HISTORY :  Past Medical History:  Diagnosis Date  . Cryptogenic stroke ([place]) 08/2016   a. 09/15/2016 in setting of PFO.  . CVA (cerebral vascular accident) ([place]) 09/07/2016  . ED (erectile dysfunction)   . Essential (primary) hypertension 02/01/2016  . History of stroke 10/08/2016  . Hx of insomnia   . Hypertension   . OSA on CPAP   . PFO (patent foramen ovale)    a. s/p closure 09/2016 by Dr. [name].  . Pleural effusion on right 02/21/2018  . Primary cancer of right lower lobe of lung ([place]) 02/25/2018   Chemo tx's  . PVC's (premature ventricular contractions) 10/08/2016  . 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 TIA (transient ischemic attack) 09/07/2016    PAST SURGICAL HISTORY :   Past Surgical History:  Procedure Laterality Date  . APPENDECTOMY  1980  . EYE SURGERY    . PATENT FORAMEN OVALE CLOSURE  10/07/2016  . PATENT FORAMEN OVALE CLOSURE N/A 10/07/2016   Procedure: Patent [place](PFO) Closure;  Surgeon: [name], MD;  Location: [place] [facility];  Service: Cardiovascular;  Laterality: N/A;  . PORTACATH PLACEMENT N/A 02/23/2018   Procedure: INSERTION PORT-A-CATH;  Surgeon: Nestor Lewandowsky, MD;  Location: [facility] OR[facility];  Service: Thoracic;  Laterality: N/A;  . STRABISMUS SURGERY Left 1984  . TEE WITHOUT CARDIOVERSION N/A 09/08/2016   Procedure: TRANSESOPHAGEAL ECHOCARDIOGRAM (TEE);  Surgeon: [name], MD;  Location: [facility] ORS;  Service: Cardiovascular;   Laterality: N/A;  . VIDEO ASSISTED THORACOSCOPY (VATS) W/TALC PLEUADESIS Right 02/23/2018   Procedure: VIDEO ASSISTED THORACOSCOPY (VATS) W/TALC PLEUADESIS;  Surgeon: Nestor Lewandowsky, MD;  Location: [facility] ORS;  Service: Thoracic;  Laterality: Right;  . VITRECTOMY Right 2013    FAMILY HISTORY :   Family History  Problem Relation Age of Onset  . Other Paternal Uncle        Abdominal tumor     SOCIAL HISTORY:   Social History   Tobacco Use  . Smoking status: Former Smoker    Packs/day: 0.50    Years: 10.00    Pack years: 5.00    Last attempt to quit: 2011    Years since quitting: 8.8  . Smokeless tobacco: Former Systems developer    Types: Snuff  Substance Use Topics  . Alcohol use: Yes    Alcohol/week: 3.0 standard drinks    Types: 3 Cans of beer per week  . Drug use: No    ALLERGIES:  has No Known Allergies.  MEDICATIONS:  Current Outpatient Medications  Medication Sig Dispense Refill  . acetaminophen (TYLENOL) 325 MG tablet Take 2 tablets (650 mg total) by mouth every 6 (six) hours as needed for mild pain (or Fever >/= 101).    Marland Kitchen aspirin EC 81 MG EC tablet Take 1 tablet (81 mg total) by mouth daily. 120 tablet 0  . B Complex-C (B-COMPLEX WITH VITAMIN C) tablet Take 1 tablet by mouth daily.    . Diclofenac Sodium 2 % SOLN Apply 1 pump twice daily as needed. 112 g 3  . furosemide (LASIX) 20 MG tablet Take 1 tablet (20 mg total) by mouth daily as needed for edema. 30 tablet 6  . ibuprofen (ADVIL,MOTRIN) 200 MG tablet Take 800 mg by mouth daily as needed for mild pain.    Marland Kitchen lidocaine-prilocaine (EMLA) cream Apply 1 application topically as needed. Apply generously over the Mediport 45 minutes prior to chemotherapy. 30 g 0  . loratadine (CLARITIN) 10 MG tablet Take 10 mg by mouth daily as needed for allergies.     Marland Kitchen LORazepam (ATIVAN) 0.5 MG tablet Take 1 tablet (0.5 mg total) by mouth every 8 (eight) hours as needed for anxiety. 45 tablet 0  . metoprolol tartrate (LOPRESSOR) 25 MG tablet  Take 1 tablet (25 mg total) by mouth 2 (two) times daily. 60 tablet 2  . morphine (MS CONTIN) 15 MG 12 hr tablet Take 1 tablet (15 mg total) by mouth as needed for pain. 60 tablet 0  . Morphine Sulfate (MORPHINE CONCENTRATE) 10 mg / 0.5 ml concentrated solution Take 0.5 mLs (10 mg total) by mouth every 12 (twelve) hours as needed for severe pain. 118 mL 0  . Multiple Vitamin (MULTIVITAMIN WITH MINERALS) TABS tablet Take 1 tablet by mouth daily.    Marland Kitchen oxyCODONE-acetaminophen (PERCOCET) 5-325 MG tablet Take 1 tablet by mouth every 12 (twelve) hours as needed for severe pain. 60 tablet 0  . prochlorperazine (COMPAZINE) 10 MG tablet Take 1 tablet (10 mg total) by mouth every 6 (six) hours as needed (Nausea or vomiting). 30 tablet 1  . tadalafil (CIALIS) 5 MG tablet Take 5 mg by mouth daily as needed for erectile dysfunction.    Marland Kitchen  triamcinolone (NASACORT) 55 MCG/ACT AERO nasal inhaler Place 2 sprays into the nose daily as needed (congestion).     . Vitamin D, Ergocalciferol, (DRISDOL) 50000 units CAPS capsule Take 1 capsule (50,000 Units total) by mouth every 7 (seven) days. (Patient taking differently: Take 50,000 Units by mouth every 7 (seven) days. Mondays) 12 capsule 0  . zolpidem (AMBIEN) 10 MG tablet Take 10 mg by mouth at bedtime as needed for sleep.     Marland Kitchen dicyclomine (BENTYL) 10 MG capsule Take 1 capsule (10 mg total) by mouth 4 (four) times daily -  before meals and at bedtime. (Patient not taking: Reported on 07/02/2018) 40 capsule 3  . osimertinib mesylate (TAGRISSO) 80 MG tablet Take 1 tablet (80 mg total) by mouth daily. (Patient not taking: Reported on 07/27/2018) 30 tablet 5   No current facility-administered medications for this visit.     PHYSICAL EXAMINATION: ECOG PERFORMANCE STATUS: 1 - Symptomatic but completely ambulatory  BP 126/81 (BP Location: Left Arm, Patient Position: Sitting)   Pulse 84   Temp 97.7 F (36.5 C) (Oral)   Resp 18   Wt 153 lb (69.4 kg)   SpO2 99%   BMI 24.69  kg/m   Filed Weights   07/27/18 0854  Weight: 153 lb (69.4 kg)    Physical Exam  Constitutional: He is oriented to person, place, and time and well-developed, well-nourished, and in no distress.  He is alone.  HENT:  Head: Normocephalic and atraumatic.  Mouth/Throat: Oropharynx is clear and moist. No oropharyngeal exudate.  Eyes: Pupils are equal, round, and reactive to light.  Neck: Normal range of motion. Neck supple.  Cardiovascular: Normal rate and regular rhythm.  Pulmonary/Chest: No respiratory distress. He has no wheezes.  Decreased breath sounds right side most of the bases.  Right chest wall approximately 2-3 cm "KNOT"; mid-axillary line.   Abdominal: Soft. Bowel sounds are normal. He exhibits no distension and no mass. There is no tenderness. There is no rebound and no guarding.  Musculoskeletal: Normal range of motion. He exhibits no edema or tenderness.  Neurological: He is alert and oriented to person, place, and time.  Skin: Skin is warm.  Psychiatric: Affect normal.       LABORATORY DATA:  I have reviewed the data as listed    Component Value Date/Time   NA 135 07/27/2018 0758   NA 141 10/05/2016 0848   K 3.9 07/27/2018 0758   K 4.3 05/15/2013 0612   CL 101 07/27/2018 0758   CO2 26 07/27/2018 0758   GLUCOSE 97 07/27/2018 0758   BUN 22 (H) 07/27/2018 0758   BUN 17 10/05/2016 0848   CREATININE 0.84 07/27/2018 0758   CALCIUM 9.4 07/27/2018 0758   PROT 6.3 (L) 07/27/2018 0758   ALBUMIN 3.0 (L) 07/27/2018 0758   AST 31 07/27/2018 0758   ALT 20 07/27/2018 0758   ALKPHOS 74 07/27/2018 0758   BILITOT 0.6 07/27/2018 0758   GFRNONAA >60 07/27/2018 0758   GFRAA >60 07/27/2018 0758    No results found for: SPEP, UPEP  Lab Results  Component Value Date   WBC 2.5 (L) 07/27/2018   NEUTROABS 1.4 (L) 07/27/2018   HGB 10.1 (L) 07/27/2018   HCT 31.2 (L) 07/27/2018   MCV 86.9 07/27/2018   PLT 114 (L) 07/27/2018      Chemistry      Component Value  Date/Time   NA 135 07/27/2018 0758   NA 141 10/05/2016 0848   K 3.9 07/27/2018 0758  K 4.3 05/15/2013 0612   CL 101 07/27/2018 0758   CO2 26 07/27/2018 0758   BUN 22 (H) 07/27/2018 0758   BUN 17 10/05/2016 0848   CREATININE 0.84 07/27/2018 0758      Component Value Date/Time   CALCIUM 9.4 07/27/2018 0758   ALKPHOS 74 07/27/2018 0758   AST 31 07/27/2018 0758   ALT 20 07/27/2018 0758   BILITOT 0.6 07/27/2018 0758       RADIOGRAPHIC STUDIES: I have personally reviewed the radiological images as listed and agreed with the findings in the report. No results found.   ASSESSMENT & PLAN:  Primary cancer of right lower lobe of lung ([place]) #Non-small cell lung cancer/stage IV-EGFR mutated exon 19- October 17th CT scan shows significant progression of disease involving the right hemithorax.   # Currently on carbotaxol Avastin Tecentriq; s/p #1 appx 10 days; tolerated well.   proceed with cycle #2 as planned.  Recent MRI brain negative.  We will plan to have a CT scan after cycle #2.  Discussed that in general the treatment would be for 4 cycles of induction followed by maintenance therapy with Avastin-Tecentriq.   #Reviewed [facility] testing/plasma biopsy-positive for EGFR mutation/already known; no obvious treatable mutations noted.  # Chest wall pain second malignancy; stable.  Morphine 6-8 mg qhs as needed; continue Percocet 5 /325 every 12 hours as needed/MS Contin.   # Insomnia/anxiety continue Ativan prn.  Stable  # CT guided Biopsy- on 11/14- to re-evaluate for small cell transformation.   # DISPOSITION # carbo-taxol- avastin-tecentriq on 11/15-MD/labs- cbc/cmp/UA/  # CT Biopsy lung on 11/14.    Orders Placed This Encounter  Procedures  . CT BIOPSY    CT guided Right lung Biopsy-    Standing Status:   Future    Standing Expiration Date:   07/27/2019    Order Specific Question:   Lab orders requested (DO NOT place separate lab orders, these will be automatically ordered  during procedure specimen collection):    Answer:   Surgical Pathology    Order Specific Question:   Reason for Exam (SYMPTOM  OR DIAGNOSIS REQUIRED)    Answer:   lung cancer on chemo    Order Specific Question:   Preferred imaging location?    Answer:   [facility]   All questions were answered. The patient knows to call the clinic with any problems, questions or concerns.      Cammie Sickle, MD 07/27/2018 9:49 AM

## 2018-07-31 ENCOUNTER — Encounter: Payer: Self-pay | Admitting: Internal Medicine

## 2018-07-31 ENCOUNTER — Other Ambulatory Visit: Payer: Self-pay | Admitting: Radiology

## 2018-08-02 ENCOUNTER — Ambulatory Visit
Admission: RE | Admit: 2018-08-02 | Discharge: 2018-08-02 | Disposition: A | Discharge status: Home / Self Care | Payer: No Typology Code available for payment source | Source: Ambulatory Visit | Attending: Interventional Radiology | Admitting: Interventional Radiology

## 2018-08-02 ENCOUNTER — Ambulatory Visit
Admission: RE | Admit: 2018-08-02 | Discharge: 2018-08-02 | Disposition: A | Discharge status: Home / Self Care | Payer: No Typology Code available for payment source | Source: Ambulatory Visit | Attending: Internal Medicine | Admitting: Internal Medicine

## 2018-08-02 ENCOUNTER — Other Ambulatory Visit: Payer: Self-pay

## 2018-08-02 DIAGNOSIS — C3431 Malignant neoplasm of lower lobe, right bronchus or lung: Secondary | ICD-10-CM | POA: Diagnosis not present

## 2018-08-02 DIAGNOSIS — C7989 Secondary malignant neoplasm of other specified sites: Secondary | ICD-10-CM | POA: Insufficient documentation

## 2018-08-02 DIAGNOSIS — R918 Other nonspecific abnormal finding of lung field: Secondary | ICD-10-CM | POA: Diagnosis present

## 2018-08-02 LAB — PROTIME-INR
INR: 1.11
PROTHROMBIN TIME: 14.2 s (ref 11.4–15.2)

## 2018-08-02 LAB — APTT: aPTT: 31 seconds (ref 24–36)

## 2018-08-02 MED ORDER — FENTANYL CITRATE (PF) 100 MCG/2ML IJ SOLN
INTRAMUSCULAR | Status: AC
  Filled 2018-08-02: qty 4

## 2018-08-02 MED ORDER — MIDAZOLAM HCL 5 MG/5ML IJ SOLN
INTRAMUSCULAR | Status: AC | PRN
  Administered 2018-08-02: 0.5 mg via INTRAVENOUS
  Administered 2018-08-02 (×2): 1 mg via INTRAVENOUS

## 2018-08-02 MED ORDER — FENTANYL CITRATE (PF) 100 MCG/2ML IJ SOLN
INTRAMUSCULAR | Status: AC | PRN
  Administered 2018-08-02: 50 ug via INTRAVENOUS
  Administered 2018-08-02: 25 ug via INTRAVENOUS

## 2018-08-02 MED ORDER — HEPARIN SOD (PORK) LOCK FLUSH 100 UNIT/ML IV SOLN
INTRAVENOUS | Status: AC
  Filled 2018-08-02: qty 5

## 2018-08-02 MED ORDER — MIDAZOLAM HCL 5 MG/5ML IJ SOLN
INTRAMUSCULAR | Status: AC
  Filled 2018-08-02: qty 5

## 2018-08-02 MED ORDER — SODIUM CHLORIDE 0.9 % IV SOLN
INTRAVENOUS | Status: DC
  Administered 2018-08-02: 11:00:00 via INTRAVENOUS

## 2018-08-02 NOTE — Consult Note (Signed)
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  Referring Physician(s): Brahmanday,Govinda R  Patient Status: [facility] - Out-pt  History of Present Illness: Ruben Eaton is a 47 y.o. 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  The patient is accompanied by his wife though serves as his own historian.  Patient needs to complain of right lateral chest pain.  There is been no change in his baseline surprisingly mild shortness of breath.  Mild dry cough.  No hemoptysis.     Past Medical History:  Diagnosis Date  . Cryptogenic stroke ([place]) 08/2016   a. 09/15/2016 in setting of PFO.  . CVA (cerebral vascular accident) ([place]) 09/07/2016  . ED (erectile dysfunction)   . Essential (primary) hypertension 02/01/2016  . History of stroke 10/08/2016  . Hx of insomnia   . Hypertension   . OSA on CPAP   . PFO (patent foramen ovale)    a. s/p closure 09/2016 by Dr. [name].  . Pleural effusion on right 02/21/2018  . Primary cancer of right lower lobe of lung ([place]) 02/25/2018   Chemo tx's  . PVC's (premature ventricular contractions) 10/08/2016  . 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 TIA (transient ischemic attack) 09/07/2016    Past Surgical History:  Procedure Laterality Date  . APPENDECTOMY  1980  . EYE SURGERY    . PATENT FORAMEN OVALE CLOSURE  10/07/2016  . PATENT FORAMEN OVALE CLOSURE N/A 10/07/2016   Procedure: Patent [place](PFO) Closure;  Surgeon: [name], MD;  Location: [facility];  Service: Cardiovascular;  Laterality: N/A;  . PORTACATH PLACEMENT N/A 02/23/2018   Procedure: INSERTION PORT-A-CATH;   Surgeon: Nestor Lewandowsky, MD;  Location: [facility] ORS;  Service: Thoracic;  Laterality: N/A;  . STRABISMUS SURGERY Left 1984  . TEE WITHOUT CARDIOVERSION N/A 09/08/2016   Procedure: TRANSESOPHAGEAL ECHOCARDIOGRAM (TEE);  Surgeon: [name], MD;  Location: [facility] ORS;  Service: Cardiovascular;  Laterality: N/A;  . VIDEO ASSISTED THORACOSCOPY (VATS) W/TALC PLEUADESIS Right 02/23/2018   Procedure: VIDEO ASSISTED THORACOSCOPY (VATS) W/TALC PLEUADESIS;  Surgeon: Nestor Lewandowsky, MD;  Location: [facility] ORS;  Service: Thoracic;  Laterality: Right;  . VITRECTOMY Right 2013    Allergies: Patient has no known allergies.  Medications: Prior to Admission medications   Medication Sig Start Date End Date Taking? Authorizing Provider  acetaminophen (TYLENOL) 325 MG tablet Take 2 tablets (650 mg total) by mouth every 6 (six) hours as needed for mild pain (or Fever >/= 101). 03/27/18  Yes Debbe Odea, MD  aspirin EC 81 MG EC tablet Take 1 tablet (81 mg total) by mouth daily. 09/08/16  Yes Mody, Ulice Bold, MD  Diclofenac Sodium 2 % SOLN Apply 1 pump twice daily as needed. 02/01/16  Yes Lyndal Pulley, DO  dicyclomine (BENTYL) 10 MG capsule Take 1 capsule (10 mg total) by mouth 4 (four) times daily -  before meals and at bedtime. 05/25/18  Yes Cammie Sickle, MD  furosemide (LASIX) 20 MG tablet Take 1 tablet (20 mg total) by mouth daily as needed for edema. 06/18/18 09/16/18 Yes Bhagat, Bhavinkumar, PA  ibuprofen (ADVIL,MOTRIN) 200 MG tablet Take 800 mg by mouth daily as needed for  mild pain.   Yes [provider]  lidocaine-prilocaine (EMLA) cream Apply 1 application topically as needed. Apply generously over the Mediport 45 minutes prior to chemotherapy. 05/11/18  Yes Cammie Sickle, MD  loratadine (CLARITIN) 10 MG tablet Take 10 mg by mouth daily as needed for allergies.    Yes [provider]  LORazepam (ATIVAN) 0.5 MG tablet Take 1 tablet (0.5 mg total) by mouth every 8 (eight) hours as  needed for anxiety. 07/23/18  Yes Cammie Sickle, MD  metoprolol tartrate (LOPRESSOR) 25 MG tablet Take 1 tablet (25 mg total) by mouth 2 (two) times daily. 02/27/18  Yes Dustin Flock, MD  Morphine Sulfate (MORPHINE CONCENTRATE) 10 mg / 0.5 ml concentrated solution Take 0.5 mLs (10 mg total) by mouth every 12 (twelve) hours as needed for severe pain. 06/25/18  Yes Cammie Sickle, MD  Multiple Vitamin (MULTIVITAMIN WITH MINERALS) TABS tablet Take 1 tablet by mouth daily.   Yes [provider]  oxyCODONE-acetaminophen (PERCOCET) 5-325 MG tablet Take 1 tablet by mouth every 12 (twelve) hours as needed for severe pain. 07/13/18  Yes Cammie Sickle, MD  prochlorperazine (COMPAZINE) 10 MG tablet Take 1 tablet (10 mg total) by mouth every 6 (six) hours as needed (Nausea or vomiting). 07/20/18  Yes Cammie Sickle, MD  tadalafil (CIALIS) 5 MG tablet Take 5 mg by mouth daily as needed for erectile dysfunction.   Yes [provider]  triamcinolone (NASACORT) 55 MCG/ACT AERO nasal inhaler Place 2 sprays into the nose daily as needed (congestion).    Yes [provider]  Vitamin D, Ergocalciferol, (DRISDOL) 50000 units CAPS capsule Take 1 capsule (50,000 Units total) by mouth every 7 (seven) days. Patient taking differently: Take 50,000 Units by mouth every 7 (seven) days. Mondays 05/05/16  Yes Lyndal Pulley, DO  zolpidem (AMBIEN) 10 MG tablet Take 10 mg by mouth at bedtime as needed for sleep.  12/10/15  Yes [provider]  B Complex-C (B-COMPLEX WITH VITAMIN C) tablet Take 1 tablet by mouth daily.    [provider]  morphine (MS CONTIN) 15 MG 12 hr tablet Take 1 tablet (15 mg total) by mouth as needed for pain. 07/13/18   Cammie Sickle, MD  osimertinib mesylate (TAGRISSO) 80 MG tablet Take 1 tablet (80 mg total) by mouth daily. Patient not taking: Reported on 07/27/2018 03/19/18   Cammie Sickle, MD     Family History    Problem Relation Age of Onset  . Other Paternal Uncle        Abdominal tumor     Social History   Socioeconomic History  . Marital status: Married    Spouse name: [name]   . Number of children: Not on file  . Years of education: Not on file  . Highest education level: Not on file  Occupational History  . Occupation: Software engineer  Social Needs  . Financial resource strain: Not on file  . Food insecurity:    Worry: Not on file    Inability: Not on file  . Transportation needs:    Medical: Not on file    Non-medical: Not on file  Tobacco Use  . Smoking status: Former Smoker    Packs/day: 0.50    Years: 10.00    Pack years: 5.00    Last attempt to quit: 2011    Years since quitting: 8.8  . Smokeless tobacco: Former Systems developer    Types: Snuff  Substance and Sexual Activity  .  Alcohol use: Yes    Alcohol/week: 3.0 standard drinks    Types: 3 Cans of beer per week  . Drug use: No  . Sexual activity: Yes  Lifestyle  . Physical activity:    Days per week: Not on file    Minutes per session: Not on file  . Stress: Not on file  Relationships  . Social connections:    Talks on phone: Not on file    Gets together: Not on file    Attends religious service: Not on file    Active member of club or organization: Not on file    Attends meetings of clubs or organizations: Not on file    Relationship status: Not on file  Other Topics Concern  . Not on file  Social History Narrative  . Not on file    ECOG Status: 1 - Symptomatic but completely ambulatory  Review of Systems: A 12 point ROS discussed and pertinent positives are indicated in the HPI above.  All other systems are negative.  Review of Systems  Respiratory: Positive for cough and shortness of breath.        Patient continues to complain of right-sided chest wall pain    Vital Signs: BP 126/85   Temp 98.2 F (36.8 C) (Oral)   Resp 20   SpO2 100%   Physical Exam  Constitutional:  Patient appears slightly  cachectic.  Cardiovascular: Regular rhythm.  Pulmonary/Chest:  Decreased breath sounds on the right.  Skin: Skin is warm and dry.  Psychiatric: He has a normal mood and affect. His behavior is normal.  Nursing note and vitals reviewed.   Imaging: Ct Chest W Contrast  Result Date: 07/12/2018 CLINICAL DATA:  Patient with history of lung cancer. Restaging exam. EXAM: CT CHEST, ABDOMEN, AND PELVIS WITH CONTRAST TECHNIQUE: Multidetector CT imaging of the chest, abdomen and pelvis was performed following the standard protocol during bolus administration of intravenous contrast. CONTRAST:  130mL ISOVUE-300 IOPAMIDOL (ISOVUE-300) INJECTION 61% COMPARISON:  CT chest 05/08/2018; CT chest 03/21/2018; PET-CT 03/01/2018. FINDINGS: CT CHEST FINDINGS Cardiovascular: Right anterior chest wall Port-A-Cath is present with tip terminating in the superior vena cava. Leftward shift of the mediastinum. Normal heart size. Small amount of pericardial fluid. Mediastinum/Nodes: Similar 1.4 cm superior mediastinal (image 13; series 2) lymph node. Leftward shift of mediastinum. Normal appearance of the esophagus. Lungs/Pleura: Central airways are patent. When compared to prior exam there has been significant interval increase in heterogeneously enhancing tumor throughout the majority of the right hemithorax involving the lung and pleura. Minimal amount of aerated right upper lung. More inferiorly within the right hemithorax tumor burden measures up to 25 x 21 cm, previously 12 x 7 cm. Tumor impresses upon and appears to invade at least the posterior aspect of the mediastinum. There is tumor involving the right lateral chest wall measuring up to 3.4 x 5.3 cm (image 42; series 2). There is a right lateral chest wall nodule measuring 2.8 x 2.2 cm (image 52; series 2), previously 1.5 x 1.0 cm. Similar appearing tiny nodules within the left upper lobe. Musculoskeletal: No aggressive or acute appearing osseous lesions. CT ABDOMEN PELVIS  FINDINGS Hepatobiliary: The liver is normal in size and contour. No focal hepatic lesions identified. Gallbladder is unremarkable. Pancreas: Unremarkable Spleen: Unremarkable Adrenals/Urinary Tract: Normal adrenal glands. Kidneys enhance symmetrically with contrast. Urinary bladder is unremarkable. Stomach/Bowel: No abnormal bowel wall thickening or evidence for bowel obstruction. No free fluid or free intraperitoneal air. Vascular/Lymphatic: Normal caliber abdominal aorta.  No retroperitoneal lymphadenopathy. Reproductive: Unremarkable Other: None Musculoskeletal: Lumbar spine degenerative changes. No aggressive or acute appearing osseous lesions. IMPRESSION: Significant interval increase in size of tumor throughout the right hemithorax which now involves the lateral aspect of the right chest wall and right chest wall soft tissues. Increased leftward shift of the mediastinal structures. Electronically Signed   By: Lovey Newcomer M.D.   On: 07/12/2018 11:28   Mr Jeri Cos QI Contrast  Result Date: 07/15/2018 CLINICAL DATA:  Lung cancer. Progressive headaches. Rapid progression of lung tumor size. Diffuse pleural disease. EXAM: MRI HEAD WITHOUT AND WITH CONTRAST TECHNIQUE: Multiplanar, multiecho pulse sequences of the brain and surrounding structures were obtained without and with intravenous contrast. CONTRAST:  7 mL Gadavist. COMPARISON:  MRI brain 02/24/2018 FINDINGS: Brain: No focal enhancement present to suggest metastatic disease to the brain or meninges. Discrete lesions are present. A remote infarct of the right insular cortex is again noted. Remote posterior cerebellar infarcts are present. No acute infarct, hemorrhage, or mass lesion is present. The ventricles are of normal size. No significant extraaxial fluid collection is present. Vascular: Flow is present in the major intracranial arteries. Skull and upper cervical spine: Skull base is within normal limits. Decreased marrow signal in the upper cervical  spine is compatible with anemia. No discrete osseous lesions are present. Sinuses/Orbits: The paranasal sinuses and mastoid air cells are clear. Globes and orbits are within normal limits. IMPRESSION: 1. No evidence for metastatic disease to the brain. 2. Remote infarcts involve the right insular cortex and cerebellum bilaterally. 3. No acute intracranial abnormality or focal lesion to explain headaches. Electronically Signed   By: San Morelle M.D.   On: 07/15/2018 13:46   Ct Abdomen Pelvis W Contrast  Result Date: 07/12/2018 CLINICAL DATA:  Patient with history of lung cancer. Restaging exam. EXAM: CT CHEST, ABDOMEN, AND PELVIS WITH CONTRAST TECHNIQUE: Multidetector CT imaging of the chest, abdomen and pelvis was performed following the standard protocol during bolus administration of intravenous contrast. CONTRAST:  145mL ISOVUE-300 IOPAMIDOL (ISOVUE-300) INJECTION 61% COMPARISON:  CT chest 05/08/2018; CT chest 03/21/2018; PET-CT 03/01/2018. FINDINGS: CT CHEST FINDINGS Cardiovascular: Right anterior chest wall Port-A-Cath is present with tip terminating in the superior vena cava. Leftward shift of the mediastinum. Normal heart size. Small amount of pericardial fluid. Mediastinum/Nodes: Similar 1.4 cm superior mediastinal (image 13; series 2) lymph node. Leftward shift of mediastinum. Normal appearance of the esophagus. Lungs/Pleura: Central airways are patent. When compared to prior exam there has been significant interval increase in heterogeneously enhancing tumor throughout the majority of the right hemithorax involving the lung and pleura. Minimal amount of aerated right upper lung. More inferiorly within the right hemithorax tumor burden measures up to 25 x 21 cm, previously 12 x 7 cm. Tumor impresses upon and appears to invade at least the posterior aspect of the mediastinum. There is tumor involving the right lateral chest wall measuring up to 3.4 x 5.3 cm (image 42; series 2). There is a  right lateral chest wall nodule measuring 2.8 x 2.2 cm (image 52; series 2), previously 1.5 x 1.0 cm. Similar appearing tiny nodules within the left upper lobe. Musculoskeletal: No aggressive or acute appearing osseous lesions. CT ABDOMEN PELVIS FINDINGS Hepatobiliary: The liver is normal in size and contour. No focal hepatic lesions identified. Gallbladder is unremarkable. Pancreas: Unremarkable Spleen: Unremarkable Adrenals/Urinary Tract: Normal adrenal glands. Kidneys enhance symmetrically with contrast. Urinary bladder is unremarkable. Stomach/Bowel: No abnormal bowel wall thickening or evidence for bowel obstruction. No  free fluid or free intraperitoneal air. Vascular/Lymphatic: Normal caliber abdominal aorta. No retroperitoneal lymphadenopathy. Reproductive: Unremarkable Other: None Musculoskeletal: Lumbar spine degenerative changes. No aggressive or acute appearing osseous lesions. IMPRESSION: Significant interval increase in size of tumor throughout the right hemithorax which now involves the lateral aspect of the right chest wall and right chest wall soft tissues. Increased leftward shift of the mediastinal structures. Electronically Signed   By: Lovey Newcomer M.D.   On: 07/12/2018 11:28    Labs:  CBC: Recent Labs    06/04/18 0956 07/02/18 1029 07/13/18 1411 07/27/18 0758  WBC 6.4 6.5 7.0 2.5*  HGB 11.5* 10.2* 9.6* 10.1*  HCT 34.1* 29.9* 29.5* 31.2*  PLT 178 181 172 114*    COAGS: Recent Labs    02/23/18 0237 03/21/18 0520 08/02/18 1009  INR 1.05 1.16 1.11  APTT 29  --  31    BMP: Recent Labs    06/04/18 0956 07/02/18 1029 07/13/18 1411 07/27/18 0758  NA 133* 131* 131* 135  K 4.4 4.4 3.9 3.9  CL 102 97* 93* 101  CO2 23 26 27 26   GLUCOSE 99 98 95 97  BUN 23* 16 17 22*  CALCIUM 9.3 9.3 10.2 9.4  CREATININE 0.89 0.89 0.92 0.84  GFRNONAA >60 >60 >60 >60  GFRAA >60 >60 >60 >60    LIVER FUNCTION TESTS: Recent Labs    06/04/18 0956 07/02/18 1029 07/13/18 1411  07/27/18 0758  BILITOT 0.7 0.7 0.7 0.6  AST 18 24 28 31   ALT 11 13 13 20   ALKPHOS 61 62 65 74  PROT 6.6 6.1* 6.0* 6.3*  ALBUMIN 3.4* 3.0* 2.9* 3.0*    TUMOR MARKERS: No results for input(s): AFPTM, CEA, CA199, CHROMGRNA in the last 8760 hours.  Assessment and Plan:  Ruben Eaton is a 47 y.o. 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  I will attempt to perform an image guided biopsy of the enlarging indeterminate nodule along the right lateral chest wall thereby not subjecting the patient to definitive lung biopsy and associated complications.  Regardless, patient was consented for both right lateral chest wall AND right lung mass biopsy.  Risks and benefits of both procedures was discussed with the patient including, but not limited to bleeding, hemoptysis, respiratory failure requiring intubation, infection, pneumothorax requiring chest tube placement, stroke from air embolism or even death.  All of the patient's questions were answered, patient is agreeable to proceed.  Consent signed and in chart.  Thank you for this interesting consult.  I greatly enjoyed meeting DIO GILLER and look forward to participating in their care.  A copy of this report was sent to the requesting provider on this date.  Electronically Signed: Sandi Mariscal, MD 08/02/2018, 11:05 AM   I spent a total of 15 Minutes in face to face in clinical consultation, greater than 50% of which was counseling/coordinating care for image guided right lung/chest wall mass biopsy.

## 2018-08-02 NOTE — Discharge Instructions (Signed)
Needle Biopsy, Care After °Refer to this sheet in the next few weeks. These instructions provide you with information about caring for yourself after your procedure. Your health care provider may also give you more specific instructions. Your treatment has been planned according to current medical practices, but problems sometimes occur. Call your health care provider if you have any problems or questions after your procedure. °What can I expect after the procedure? °After your procedure, it is common to have soreness, bruising, or mild pain at the biopsy site. This should go away in a few days. °Follow these instructions at home: °· Rest as directed by your health care provider. °· Take medicines only as directed by your health care provider. °· There are many different ways to close and cover the biopsy site, including stitches (sutures), skin glue, and adhesive strips. Follow your health care provider's instructions about: °? Biopsy site care. °? Bandage (dressing) changes and removal. °? Biopsy site closure removal. °· Check your biopsy site every day for signs of infection. Watch for: °? Redness, swelling, or pain. °? Fluid, blood, or pus. °Contact a health care provider if: °· You have a fever. °· You have redness, swelling, or pain at the biopsy site that lasts longer than a few days. °· You have fluid, blood, or pus coming from the biopsy site. °· You feel nauseous. °· You vomit. °Get help right away if: °· You have shortness of breath. °· You have trouble breathing. °· You have chest pain. °· You feel dizzy or you faint. °· You have bleeding that does not stop with pressure or a bandage. °· You cough up blood. °· You have pain in your abdomen. °This information is not intended to replace advice given to you by your health care provider. Make sure you discuss any questions you have with your health care provider. °Document Released: 01/27/2015 Document Revised: 02/18/2016 Document Reviewed:  09/08/2014 °Elsevier Interactive Patient Education © 2018 [employer] ° °

## 2018-08-02 NOTE — Procedures (Signed)
Pre Procedure Dx: Right lateral chest wall mass Post Procedural Dx: Same  Technically successful CT and US guided biopsy of indeterminate right lateral chest wall mass.  EBL: None  No immediate complications.   Ronny Bacon, MD Pager #: [phone]

## 2018-08-03 LAB — SURGICAL PATHOLOGY

## 2018-08-06 ENCOUNTER — Other Ambulatory Visit: Payer: Self-pay | Admitting: Internal Medicine

## 2018-08-06 NOTE — Telephone Encounter (Signed)
Patient called Ruben Eaton requesting refill of morphine.   As mandated by the [place] STOP Act (Strengthen Opioid Misuse Prevention), the [place] Controlled Substance Reporting System ([place]) was reviewed for this patient.  Below is the past 40-months of controlled substance prescriptions as displayed by the registry.  I have personally consulted with my supervising physician, Dr. Rogue Bussing, who agrees that continuation of opiate therapy is medically appropriate at this time and agrees to provide continual monitoring, including urine/blood drug screens, as indicated. Prescription sent electronically using [employer] secure transmission to requested pharmacy.   [place] Reviewed:     Beckey Rutter, DNP, AGNP-C [place] at [facility] [phone] (work cell) [phone] (office) 08/06/18 3:56 PM

## 2018-08-06 NOTE — Telephone Encounter (Signed)
Dr Sharmaine Base patient

## 2018-08-07 MED ORDER — MORPHINE SULFATE (CONCENTRATE) 10 MG /0.5 ML PO SOLN: 10.0000 mg | Freq: Four times a day (QID) | ORAL | 0 refills | Status: DC | PRN

## 2018-08-10 ENCOUNTER — Inpatient Hospital Stay: Payer: No Typology Code available for payment source

## 2018-08-10 ENCOUNTER — Encounter: Payer: Self-pay | Admitting: Internal Medicine

## 2018-08-10 ENCOUNTER — Inpatient Hospital Stay (HOSPITAL_BASED_OUTPATIENT_CLINIC_OR_DEPARTMENT_OTHER): Payer: No Typology Code available for payment source | Admitting: Internal Medicine

## 2018-08-10 ENCOUNTER — Other Ambulatory Visit: Payer: Self-pay

## 2018-08-10 VITALS — BP 127/79 | HR 63 | Temp 97.6°F | Resp 18 | Ht 66.0 in | Wt 153.0 lb

## 2018-08-10 DIAGNOSIS — G4733 Obstructive sleep apnea (adult) (pediatric): Secondary | ICD-10-CM

## 2018-08-10 DIAGNOSIS — R040 Epistaxis: Secondary | ICD-10-CM

## 2018-08-10 DIAGNOSIS — Z9981 Dependence on supplemental oxygen: Secondary | ICD-10-CM

## 2018-08-10 DIAGNOSIS — Z791 Long term (current) use of non-steroidal anti-inflammatories (NSAID): Secondary | ICD-10-CM

## 2018-08-10 DIAGNOSIS — C3431 Malignant neoplasm of lower lobe, right bronchus or lung: Secondary | ICD-10-CM

## 2018-08-10 DIAGNOSIS — I10 Essential (primary) hypertension: Secondary | ICD-10-CM

## 2018-08-10 DIAGNOSIS — Z87891 Personal history of nicotine dependence: Secondary | ICD-10-CM

## 2018-08-10 DIAGNOSIS — Z8673 Personal history of transient ischemic attack (TIA), and cerebral infarction without residual deficits: Secondary | ICD-10-CM

## 2018-08-10 DIAGNOSIS — R5383 Other fatigue: Secondary | ICD-10-CM

## 2018-08-10 DIAGNOSIS — Z7189 Other specified counseling: Secondary | ICD-10-CM

## 2018-08-10 DIAGNOSIS — Z7982 Long term (current) use of aspirin: Secondary | ICD-10-CM

## 2018-08-10 DIAGNOSIS — J90 Pleural effusion, not elsewhere classified: Secondary | ICD-10-CM | POA: Diagnosis not present

## 2018-08-10 DIAGNOSIS — Z79899 Other long term (current) drug therapy: Secondary | ICD-10-CM

## 2018-08-10 DIAGNOSIS — R5381 Other malaise: Secondary | ICD-10-CM

## 2018-08-10 LAB — URINALYSIS, COMPLETE (UACMP) WITH MICROSCOPIC
Bacteria, UA: NONE SEEN
Bilirubin Urine: NEGATIVE
GLUCOSE, UA: NEGATIVE mg/dL
HGB URINE DIPSTICK: NEGATIVE
Ketones, ur: NEGATIVE mg/dL
Leukocytes, UA: NEGATIVE
NITRITE: NEGATIVE
PH: 5 (ref 5.0–8.0)
PROTEIN: NEGATIVE mg/dL
Specific Gravity, Urine: 1.017 (ref 1.005–1.030)
Squamous Epithelial / LPF: NONE SEEN (ref 0–5)

## 2018-08-10 LAB — COMPREHENSIVE METABOLIC PANEL
ALT: 11 U/L (ref 0–44)
ANION GAP: 10 (ref 5–15)
AST: 21 U/L (ref 15–41)
Albumin: 2.9 g/dL — ABNORMAL LOW (ref 3.5–5.0)
Alkaline Phosphatase: 83 U/L (ref 38–126)
BUN: 22 mg/dL — ABNORMAL HIGH (ref 6–20)
CHLORIDE: 101 mmol/L (ref 98–111)
CO2: 24 mmol/L (ref 22–32)
Calcium: 9.1 mg/dL (ref 8.9–10.3)
Creatinine, Ser: 0.86 mg/dL (ref 0.61–1.24)
GFR calc non Af Amer: >60 mL/min (ref >60)
Glucose, Bld: 90 mg/dL (ref 70–99)
POTASSIUM: 3.9 mmol/L (ref 3.5–5.1)
SODIUM: 135 mmol/L (ref 135–145)
TOTAL PROTEIN: 6 g/dL — AB (ref 6.5–8.1)
Total Bilirubin: 0.4 mg/dL (ref 0.3–1.2)

## 2018-08-10 LAB — CBC WITH DIFFERENTIAL/PLATELET
ABS IMMATURE GRANULOCYTES: 0.01 10*3/uL (ref 0.00–0.07)
BASOS ABS: 0 10*3/uL (ref 0.0–0.1)
BASOS PCT: 0 %
Eosinophils Absolute: 0.1 10*3/uL (ref 0.0–0.5)
Eosinophils Relative: 3 %
HCT: 28.5 % — ABNORMAL LOW (ref 39.0–52.0)
HEMOGLOBIN: 9.3 g/dL — AB (ref 13.0–17.0)
Immature Granulocytes: 0 %
LYMPHS PCT: 23 %
Lymphs Abs: 1.2 10*3/uL (ref 0.7–4.0)
MCH: 28.4 pg (ref 26.0–34.0)
MCHC: 32.6 g/dL (ref 30.0–36.0)
MCV: 86.9 fL (ref 80.0–100.0)
Monocytes Absolute: 0.5 10*3/uL (ref 0.1–1.0)
Monocytes Relative: 9 %
NEUTROS ABS: 3.2 10*3/uL (ref 1.7–7.7)
NEUTROS PCT: 65 %
NRBC: 0 % (ref 0.0–0.2)
PLATELETS: 137 10*3/uL — AB (ref 150–400)
RBC: 3.28 MIL/uL — AB (ref 4.22–5.81)
RDW: 15.6 % — ABNORMAL HIGH (ref 11.5–15.5)
WBC: 5 10*3/uL (ref 4.0–10.5)

## 2018-08-10 MED ORDER — SODIUM CHLORIDE 0.9 % IV SOLN
1200.0000 mg | Freq: Once | INTRAVENOUS | Status: AC
  Administered 2018-08-10: 1200 mg via INTRAVENOUS
  Filled 2018-08-10: qty 20

## 2018-08-10 MED ORDER — PALONOSETRON HCL INJECTION 0.25 MG/5ML
0.2500 mg | Freq: Once | INTRAVENOUS | Status: AC
  Administered 2018-08-10: 0.25 mg via INTRAVENOUS
  Filled 2018-08-10: qty 5

## 2018-08-10 MED ORDER — SODIUM CHLORIDE 0.9 % IV SOLN
Freq: Once | INTRAVENOUS | Status: AC
  Administered 2018-08-10: 10:00:00 via INTRAVENOUS
  Filled 2018-08-10: qty 250

## 2018-08-10 MED ORDER — DIPHENHYDRAMINE HCL 50 MG/ML IJ SOLN
50.0000 mg | Freq: Once | INTRAMUSCULAR | Status: AC
  Administered 2018-08-10: 50 mg via INTRAVENOUS
  Filled 2018-08-10: qty 1

## 2018-08-10 MED ORDER — SODIUM CHLORIDE 0.9 % IV SOLN
200.0000 mg/m2 | Freq: Once | INTRAVENOUS | Status: AC
  Administered 2018-08-10: 384 mg via INTRAVENOUS
  Filled 2018-08-10: qty 64

## 2018-08-10 MED ORDER — SODIUM CHLORIDE 0.9 % IV SOLN: 865.8000 mg | Freq: Once | INTRAVENOUS | Status: DC

## 2018-08-10 MED ORDER — FAMOTIDINE IN NACL 20-0.9 MG/50ML-% IV SOLN: 20.0000 mg | Freq: Once | INTRAVENOUS | Status: DC

## 2018-08-10 MED ORDER — SODIUM CHLORIDE 0.9 % IV SOLN
780.0000 mg | Freq: Once | INTRAVENOUS | Status: AC
  Administered 2018-08-10: 780 mg via INTRAVENOUS
  Filled 2018-08-10: qty 78

## 2018-08-10 MED ORDER — SODIUM CHLORIDE 0.9 % IV SOLN
Freq: Once | INTRAVENOUS | Status: AC
  Administered 2018-08-10: 10:00:00 via INTRAVENOUS
  Filled 2018-08-10: qty 50

## 2018-08-10 MED ORDER — SODIUM CHLORIDE 0.9 % IV SOLN
1000.0000 mg | INTRAVENOUS | Status: DC
  Administered 2018-08-10: 1000 mg via INTRAVENOUS
  Filled 2018-08-10: qty 32

## 2018-08-10 MED ORDER — SODIUM CHLORIDE 0.9 % IV SOLN
20.0000 mg | Freq: Once | INTRAVENOUS | Status: AC
  Administered 2018-08-10: 20 mg via INTRAVENOUS
  Filled 2018-08-10: qty 2

## 2018-08-10 MED ORDER — HEPARIN SOD (PORK) LOCK FLUSH 100 UNIT/ML IV SOLN
500.0000 [IU] | Freq: Once | INTRAVENOUS | Status: AC
  Administered 2018-08-10: 500 [IU] via INTRAVENOUS
  Filled 2018-08-10: qty 5

## 2018-08-10 NOTE — Progress Notes (Signed)
[place] OFFICE PROGRESS NOTE  Patient Care Team: Dion Body, MD as PCP - General (Family Medicine) [name], MD as PCP - Cardiology (Cardiology) Telford Nab, RN as Registered Nurse  Cancer Staging Primary cancer of right lower lobe of lung [facility]) Staging form: Lung, AJCC 8th Edition - Clinical stage from 03/02/2018: Stage IV (cT4, cN0, pM1a) - Unsigned    Oncology History   # June 2019- LUNG CA- HIGH GRADE CA with sarcomatoid features [EGFR MUTATED; exon 19; clinically non-small cell];Dr.Jeffrey Carlis Abbott; II opinion at [facility];  # June 17th- carbo-taxol #1; PROGRESSION June 26th 2019- Osimertinib 45m/day; [facility]];   # Oct 2019- Severe progression in chest; START carbo-Taxol-Tecentriq-Avastin  # Repeat chest Bx- Nov 2019- 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  male pleasant patient above history of metastatic adenocarcinoma the lung/EGFR mutated is currently carbotaxol Tecentriq plus Avastin is here for follow-up.  Patient received cycle #1 of chemotherapy immunotherapy 3 weeks ago.  Patient continues to complain of pain in his right chest wall needing to take morphine liquid/also Percocet.  He continues to work especially nighttime.  He had 2 episodes of nosebleeds he has been using oxygen at night.   Review of Systems  Constitutional: Positive for malaise/fatigue. Negative for chills, diaphoresis, fever and weight loss.  HENT: Negative for nosebleeds and sore throat.   Eyes: Negative for double vision.  Respiratory: Positive  for cough and shortness of breath. Negative for hemoptysis, sputum production and wheezing.   Cardiovascular: Positive for chest pain (Right chest wall pain.). Negative for palpitations, orthopnea and leg swelling.  Gastrointestinal: Negative for blood in stool, constipation, heartburn, melena, nausea and vomiting.  Genitourinary: Negative for dysuria, frequency and urgency.  Musculoskeletal: Negative for back pain and joint pain.  Skin: Negative.  Negative for itching and rash.  Neurological: Negative for dizziness, tingling, focal weakness, weakness and headaches.  Endo/Heme/Allergies: Does not bruise/bleed easily.  Psychiatric/Behavioral: Negative for depression. The patient is not nervous/anxious and does not have insomnia.       PAST MEDICAL HISTORY :  Past Medical History:  Diagnosis Date  . Cryptogenic stroke ([place] 08/2016   a. 09/15/2016 in setting of PFO.  . CVA (cerebral vascular accident) ([place] 09/07/2016  . ED (erectile dysfunction)   . Essential (primary) hypertension 02/01/2016  . History of stroke 10/08/2016  . Hx of insomnia   . Hypertension   . OSA on CPAP   . PFO (patent foramen ovale)    a. s/p closure 09/2016 by Dr. CBurt Knack  . Pleural effusion on right 02/21/2018  . Primary cancer of right lower lobe of lung ([place] 02/25/2018   Chemo tx's  . PVC's (premature ventricular contractions) 10/08/2016  . 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 TIA (transient ischemic attack) 09/07/2016    PAST SURGICAL HISTORY :   Past Surgical History:  Procedure Laterality Date  . APPENDECTOMY  1980  . EYE SURGERY    . PATENT FORAMEN OVALE CLOSURE  10/07/2016  . PATENT FORAMEN  OVALE CLOSURE N/A 10/07/2016   Procedure: Patent Forament [place](PFO) Closure;  Surgeon: [name], MD;  Location: [place] [facility];  Service: Cardiovascular;  Laterality: N/A;  . PORTACATH PLACEMENT N/A 02/23/2018   Procedure: INSERTION PORT-A-CATH;  Surgeon: Nestor Lewandowsky, MD;  Location:  [facility] ORS;  Service: Thoracic;  Laterality: N/A;  . STRABISMUS SURGERY Left 1984  . TEE WITHOUT CARDIOVERSION N/A 09/08/2016   Procedure: TRANSESOPHAGEAL ECHOCARDIOGRAM (TEE);  Surgeon: [name], MD;  Location: [facility] ORS;  Service: Cardiovascular;  Laterality: N/A;  . VIDEO ASSISTED THORACOSCOPY (VATS) W/TALC PLEUADESIS Right 02/23/2018   Procedure: VIDEO ASSISTED THORACOSCOPY (VATS) W/TALC PLEUADESIS;  Surgeon: Nestor Lewandowsky, MD;  Location: [facility] ORS;  Service: Thoracic;  Laterality: Right;  . VITRECTOMY Right 2013    FAMILY HISTORY :   Family History  Problem Relation Age of Onset  . Other Paternal Uncle        Abdominal tumor     SOCIAL HISTORY:   Social History   Tobacco Use  . Smoking status: Former Smoker    Packs/day: 0.50    Years: 10.00    Pack years: 5.00    Last attempt to quit: 2011    Years since quitting: 8.8  . Smokeless tobacco: Former Systems developer    Types: Snuff  Substance Use Topics  . Alcohol use: Yes    Alcohol/week: 3.0 standard drinks    Types: 3 Cans of beer per week  . Drug use: No    ALLERGIES:  has No Known Allergies.  MEDICATIONS:  Current Outpatient Medications  Medication Sig Dispense Refill  . acetaminophen (TYLENOL) 325 MG tablet Take 2 tablets (650 mg total) by mouth every 6 (six) hours as needed for mild pain (or Fever >/= 101).    . B Complex-C (B-COMPLEX WITH VITAMIN C) tablet Take 1 tablet by mouth daily.    . Diclofenac Sodium 2 % SOLN Apply 1 pump twice daily as needed. 112 g 3  . furosemide (LASIX) 20 MG tablet Take 1 tablet (20 mg total) by mouth daily as needed for edema. 30 tablet 6  . ibuprofen (ADVIL,MOTRIN) 200 MG tablet Take 800 mg by mouth daily as needed for mild pain.    Marland Kitchen lidocaine-prilocaine (EMLA) cream Apply 1 application topically as needed. Apply generously over the Mediport 45 minutes prior to chemotherapy. 30 g 0  . loratadine (CLARITIN) 10 MG tablet Take 10 mg by mouth daily as needed for allergies.     Marland Kitchen LORazepam  (ATIVAN) 0.5 MG tablet Take 1 tablet (0.5 mg total) by mouth every 8 (eight) hours as needed for anxiety. 45 tablet 0  . metoprolol tartrate (LOPRESSOR) 25 MG tablet Take 1 tablet (25 mg total) by mouth 2 (two) times daily. 60 tablet 2  . morphine (MS CONTIN) 15 MG 12 hr tablet Take 1 tablet (15 mg total) by mouth as needed for pain. 60 tablet 0  . Morphine Sulfate (MORPHINE CONCENTRATE) 10 mg / 0.5 ml concentrated solution Take 0.5 mLs (10 mg total) by mouth every 6 (six) hours as needed for severe pain. 30 mL 0  . Multiple Vitamin (MULTIVITAMIN WITH MINERALS) TABS tablet Take 1 tablet by mouth daily.    Marland Kitchen osimertinib mesylate (TAGRISSO) 80 MG tablet Take 1 tablet (80 mg total) by mouth daily. 30 tablet 5  . oxyCODONE-acetaminophen (PERCOCET) 5-325 MG tablet Take 1 tablet by mouth every 12 (twelve) hours as needed for severe pain. 60 tablet 0  . prochlorperazine (COMPAZINE) 10 MG tablet Take  1 tablet (10 mg total) by mouth every 6 (six) hours as needed (Nausea or vomiting). 30 tablet 1  . tadalafil (CIALIS) 5 MG tablet Take 5 mg by mouth daily as needed for erectile dysfunction.    . triamcinolone (NASACORT) 55 MCG/ACT AERO nasal inhaler Place 2 sprays into the nose daily as needed (congestion).     . Vitamin D, Ergocalciferol, (DRISDOL) 50000 units CAPS capsule Take 1 capsule (50,000 Units total) by mouth every 7 (seven) days. (Patient taking differently: Take 50,000 Units by mouth every 7 (seven) days. Mondays) 12 capsule 0  . zolpidem (AMBIEN) 10 MG tablet Take 10 mg by mouth at bedtime as needed for sleep.     Marland Kitchen aspirin EC 81 MG EC tablet Take 1 tablet (81 mg total) by mouth daily. (Patient not taking: Reported on 08/10/2018) 120 tablet 0  . dicyclomine (BENTYL) 10 MG capsule Take 1 capsule (10 mg total) by mouth 4 (four) times daily -  before meals and at bedtime. (Patient not taking: Reported on 08/10/2018) 40 capsule 3   No current facility-administered medications for this visit.     Facility-Administered Medications Ordered in Other Visits  Medication Dose Route Frequency Provider Last Rate Last Dose  . bevacizumab (AVASTIN) 1,000 mg in sodium chloride 0.9 % 100 mL chemo infusion  1,000 mg Intravenous Q21 days Cammie Sickle, MD 280 mL/hr at 08/10/18 1051 1,000 mg at 08/10/18 1051  . CARBOplatin (PARAPLATIN) 780 mg in sodium chloride 0.9 % 250 mL chemo infusion  780 mg Intravenous Once Charlaine Dalton R, MD      . PACLitaxel (TAXOL) 384 mg in sodium chloride 0.9 % 500 mL chemo infusion (> 1m/m2)  200 mg/m2 (Treatment Plan Recorded) Intravenous Once BCammie Sickle MD 188 mL/hr at 08/10/18 1216 384 mg at 08/10/18 1216    PHYSICAL EXAMINATION: ECOG PERFORMANCE STATUS: 1 - Symptomatic but completely ambulatory  BP 127/79 (BP Location: Left Arm, Patient Position: Sitting)   Pulse 63   Temp 97.6 F (36.4 C) (Tympanic)   Resp 18   Ht _0  (1.676 m)   Wt 153 lb (69.4 kg)   SpO2 96% Comment: RA  BMI 24.69 kg/m   Filed Weights   08/10/18 0850  Weight: 153 lb (69.4 kg)    Physical Exam  Constitutional: He is oriented to person, place, and time and well-developed, well-nourished, and in no distress.  He is accompanied by his wife.  Is walking himself.  HENT:  Head: Normocephalic and atraumatic.  Mouth/Throat: Oropharynx is clear and moist. No oropharyngeal exudate.  Eyes: Pupils are equal, round, and reactive to light.  Neck: Normal range of motion. Neck supple.  Cardiovascular: Normal rate and regular rhythm.  Pulmonary/Chest: No respiratory distress. He has no wheezes.  Decreased breath sounds right side most of the bases.  Right chest wall approximately; approximately 4 x 8 cm;  2-3 cm "KNOTs" (progression)  Abdominal: Soft. Bowel sounds are normal. He exhibits no distension and no mass. There is no tenderness. There is no rebound and no guarding.  Musculoskeletal: Normal range of motion. He exhibits no edema or tenderness.   Neurological: He is alert and oriented to person, place, and time.  Skin: Skin is warm.  Psychiatric: Affect normal.       LABORATORY DATA:  I have reviewed the data as listed    Component Value Date/Time   NA 135 08/10/2018 0800   NA 141 10/05/2016 0848   K 3.9 08/10/2018 0800  K 4.3 05/15/2013 0612   CL 101 08/10/2018 0800   CO2 24 08/10/2018 0800   GLUCOSE 90 08/10/2018 0800   BUN 22 (H) 08/10/2018 0800   BUN 17 10/05/2016 0848   CREATININE 0.86 08/10/2018 0800   CALCIUM 9.1 08/10/2018 0800   PROT 6.0 (L) 08/10/2018 0800   ALBUMIN 2.9 (L) 08/10/2018 0800   AST 21 08/10/2018 0800   ALT 11 08/10/2018 0800   ALKPHOS 83 08/10/2018 0800   BILITOT 0.4 08/10/2018 0800   GFRNONAA >60 08/10/2018 0800   GFRAA >60 08/10/2018 0800    No results found for: SPEP, UPEP  Lab Results  Component Value Date   WBC 5.0 08/10/2018   NEUTROABS 3.2 08/10/2018   HGB 9.3 (L) 08/10/2018   HCT 28.5 (L) 08/10/2018   MCV 86.9 08/10/2018   PLT 137 (L) 08/10/2018      Chemistry      Component Value Date/Time   NA 135 08/10/2018 0800   NA 141 10/05/2016 0848   K 3.9 08/10/2018 0800   K 4.3 05/15/2013 0612   CL 101 08/10/2018 0800   CO2 24 08/10/2018 0800   BUN 22 (H) 08/10/2018 0800   BUN 17 10/05/2016 0848   CREATININE 0.86 08/10/2018 0800      Component Value Date/Time   CALCIUM 9.1 08/10/2018 0800   ALKPHOS 83 08/10/2018 0800   AST 21 08/10/2018 0800   ALT 11 08/10/2018 0800   BILITOT 0.4 08/10/2018 0800       RADIOGRAPHIC STUDIES: I have personally reviewed the radiological images as listed and agreed with the findings in the report. No results found.   ASSESSMENT & PLAN:  Primary cancer of right lower lobe of lung ([place]) #Non-small cell lung cancer/stage IV-EGFR mutated exon 19- October 17th CT scan shows significant progression of disease involving the right hemithorax.  Currently on carbotaxol Avastin Tecentriq; s/p #1appx. 3 weeks ago.  #  Concerns for clinical  progression based on worsening right chest wall masses.  However, will proceed with current therapy.  # Proceed with cycle #2 today; Labs today reviewed;  acceptable for treatment today. CT scans ordered today/ prior to cycle #3.  Left a message for radiation oncology [place]-for need for possible radiation if noted to have progressive disease on the imaging.  Discussed that unfortunately does not appear to be good.  # Again Reviewed Guardant testing/plasma biopsy-positive for EGFR mutation/already known; no obvious treatable mutations noted/lung Bx- NEGATIVE for small cell.   # Chest wall pain second malignancy; stable.  Morphine 6-8 mg qhs as needed; continue Percocet 5 /325 every 12 hours as needed/MS Contin.   # Epistaxis- ? Avastin; monitor for now; recommend nasal saline spray.   # Insomnia/anxiety continue Ativan prn.  Stable.  #Forwarded the pathology report to Dr. Ahmed Prima at [facility].  # DISPOSITION # Treatment today- carbo-taxol- avastin-tecentriq. # CT scan prior to next visit-  # Follow up- DEc 6th- MD-cbc/cmp/UA- carbo-taxol- avastin-tecentriq -Dr.B    Orders Placed This Encounter  Procedures  . CT CHEST W CONTRAST    Standing Status:   Future    Standing Expiration Date:   08/11/2019    Order Specific Question:   If indicated for the ordered procedure, I authorize the administration of contrast media per Radiology protocol    Answer:   Yes    Order Specific Question:   Preferred imaging location?    Answer:   [facility]    Order Specific Question:   Radiology  Contrast Protocol - do NOT remove file path    Answer:   [email or web address]    Order Specific Question:   ** REASON FOR EXAM (FREE TEXT)    Answer:   lung cancer on chemo  . CBC with Differential    Standing Status:   Future    Standing Expiration Date:   08/11/2019  . Comprehensive metabolic panel    Standing Status:   Future    Standing Expiration Date:   08/11/2019   . Urinalysis, Complete w Microscopic    Standing Status:   Future    Standing Expiration Date:   08/11/2019   All questions were answered. The patient knows to call the clinic with any problems, questions or concerns.      Cammie Sickle, MD 08/10/2018 2:10 PM

## 2018-08-10 NOTE — Assessment & Plan Note (Addendum)
#  Non-small cell lung cancer/stage IV-EGFR mutated exon 19- October 17th CT scan shows significant progression of disease involving the right hemithorax.  Currently on carbotaxol Avastin Tecentriq; s/p #1appx. 3 weeks ago.  #  Concerns for clinical progression based on worsening right chest wall masses.  However, will proceed with current therapy.  # Proceed with cycle #2 today; Labs today reviewed;  acceptable for treatment today. CT scans ordered today/ prior to cycle #3.  Left a message for radiation oncology [place]-for need for possible radiation if noted to have progressive disease on the imaging.  Discussed that unfortunately does not appear to be good.  # Again Reviewed Guardant testing/plasma biopsy-positive for EGFR mutation/already known; no obvious treatable mutations noted/lung Bx- NEGATIVE for small cell.   # Chest wall pain second malignancy; stable.  Morphine 6-8 mg qhs as needed; continue Percocet 5 /325 every 12 hours as needed/MS Contin.   # Epistaxis- ? Avastin; monitor for now; recommend nasal saline spray.   # Insomnia/anxiety continue Ativan prn.  Stable.  #Forwarded the pathology report to Dr. Ahmed Prima at [facility].  # DISPOSITION # Treatment today- carbo-taxol- avastin-tecentriq. # CT scan prior to next visit-  # Follow up- DEc 6th- MD-cbc/cmp/UA- carbo-taxol- avastin-tecentriq -Dr.B

## 2018-08-14 ENCOUNTER — Encounter: Payer: Self-pay | Admitting: Internal Medicine

## 2018-08-14 ENCOUNTER — Other Ambulatory Visit: Payer: Self-pay | Admitting: Internal Medicine

## 2018-08-15 MED ORDER — OXYCODONE-ACETAMINOPHEN 5-325 MG PO TABS: 1.0000 | ORAL_TABLET | Freq: Two times a day (BID) | ORAL | 0 refills | Status: DC | PRN

## 2018-08-15 NOTE — Telephone Encounter (Signed)
Patient called cancer center requesting refill of Percocet 5-325 mg q 12 hours for pain.   As mandated by the [place] STOP Act (Strengthen Opioid Misuse Prevention), the [place] Controlled Substance Reporting System ([place]) was reviewed for this patient.  Below is the past 52-months of controlled substance prescriptions as displayed by the registry.  I have personally consulted with my supervising physician, Dr. Rogue Bussing, who agrees that continuation of opiate therapy is medically appropriate at this time and agrees to provide continual monitoring, including urine/blood drug screens, as indicated. Refill is appropriate on or after 08/11/18.  NCCSRS reviewed:     Faythe Casa, NP 08/15/2018 8:51 AM [phone]

## 2018-08-16 ENCOUNTER — Ambulatory Visit
Admission: RE | Admit: 2018-08-16 | Discharge: 2018-08-16 | Disposition: A | Discharge status: Home / Self Care | Payer: No Typology Code available for payment source | Source: Ambulatory Visit | Attending: Radiation Oncology | Admitting: Radiation Oncology

## 2018-08-16 ENCOUNTER — Encounter: Payer: Self-pay | Admitting: Radiation Oncology

## 2018-08-16 ENCOUNTER — Other Ambulatory Visit: Payer: Self-pay

## 2018-08-16 ENCOUNTER — Telehealth: Payer: Self-pay | Admitting: Internal Medicine

## 2018-08-16 VITALS — BP 119/69 | HR 97 | Temp 97.6°F | Resp 20 | Ht 66.0 in | Wt 143.0 lb

## 2018-08-16 DIAGNOSIS — C3431 Malignant neoplasm of lower lobe, right bronchus or lung: Secondary | ICD-10-CM | POA: Diagnosis present

## 2018-08-16 DIAGNOSIS — Z923 Personal history of irradiation: Secondary | ICD-10-CM | POA: Insufficient documentation

## 2018-08-16 NOTE — Progress Notes (Signed)
Radiation Oncology         [phone] ________________________________  Outpatient Re-Consultation  Name: Ruben Eaton MRN: [id number]  Date: 08/16/2018  DOB: January 09, 1971  LG:[id number], Ruben Muss, MD  Cammie Sickle, *   REFERRING PHYSICIAN: Cammie Sickle, *  DIAGNOSIS: The encounter diagnosis was Primary cancer of right lower lobe of lung ([place]).   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 Cancer Staging Primary cancer of right lower lobe of lung ([place]) Staging form: Lung, AJCC 8th Edition - Clinical stage from 03/02/2018: Stage IV (cT4, cN0, pM1a) - Unsigned   HISTORY OF PRESENT ILLNESS::Ruben Eaton is a 47 y.o. male who returns today at the request of Dr. Rogue Bussing for his progressive RLL lung cancer. He was treated with palliative radiotherapy to the right lung 5 months ago (detailed below) with good initial response on follow-up CT scan. However, most recent restaging CT scans taken 07/12/18 show significant interval increase in size of tumor throughout the right hemithorax, which now involves the lateral aspect of the right chest wall and right chest wall soft tissues, as well as an increased leftward shift of the mediastinal structures. Fortunately, brain MRI on 07/15/18 did not show any evidence for metastatic disease to the brain.  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  The patient reviewed these results with Dr. Rogue Bussing and has been referred today for discussion of potential radiation treatment options. He is accompanied by his wife. He is currently receiving chemotherapy with carboplatin, Taxol, Avastin, and Tecentriq every 3 weeks.  On review of systems, the patient reports 20-pound weight loss in the past 5 months. He reports the masses in his chest wall have been there since initial diagnosis but have gotten noticeably larger  in the past month. He reports shortness of breath on exertion. He uses oxygen at home. He reports occasional non-productive cough; denies hemptysis. He reports intermittent right chest pain, severity fluctuates between 2 and 8-10. He describes the pain as achy, throbbing, and occasionally "very penetrating". He takes ibuprofen and Percocet during the day and MS Contin before bed. He reports nose bleeds. He reports rectal bleeding from hemorrhoids.   PREVIOUS RADIATION THERAPY: Yes  Radiation treatment dates:   03/21/18 - 03/23/18 Received two treatments on 03/22/18 (8:30am & 2:55pm)  and 03/23/18 (8:45am & 4:45pm) Site/dose:   20 Gy directed to the right lung delivered in 5 fractions. (The patient presented with superior vena cava syndrome and was in acute respiratory distress)  PAST MEDICAL HISTORY:  has a past medical history of Cryptogenic stroke (Ruben Eaton) (08/2016), CVA (cerebral vascular accident) (Ruben Eaton) (09/07/2016), ED (erectile dysfunction), Essential (primary) hypertension (02/01/2016), History of stroke (10/08/2016), insomnia, Hypertension, OSA on CPAP, PFO (patent foramen ovale), Pleural effusion on right (02/21/2018), Primary cancer of right lower lobe of lung (Ruben Eaton) (02/25/2018), PVC's (premature ventricular contractions) (10/08/2016), Quadriceps tendinitis (02/01/2016), and TIA (transient ischemic attack) (09/07/2016).    PAST SURGICAL HISTORY: Past Surgical History:  Procedure Laterality Date  . APPENDECTOMY  1980  . EYE SURGERY    . PATENT FORAMEN OVALE CLOSURE  10/07/2016  . PATENT FORAMEN OVALE CLOSURE N/A 10/07/2016   Procedure: Patent For[place](PFO) Closure;  Surgeon: [name], MD;  Location: [place] [facility];  Service: Cardiovascular;  Laterality: N/A;  . PORTACATH PLACEMENT N/A 02/23/2018   Procedure: INSERTION PORT-A-CATH;  Surgeon: Nestor Lewandowsky, MD;  Location: [facility];  Service: Thoracic;  Laterality: N/A;  . STRABISMUS SURGERY Left 1984  . TEE WITHOUT CARDIOVERSION N/A  09/08/2016   Procedure: TRANSESOPHAGEAL ECHOCARDIOGRAM (TEE);  Surgeon: [name], MD;  Location: [facility];  Service: Cardiovascular;  Laterality: N/A;  . VIDEO ASSISTED THORACOSCOPY (VATS) W/TALC PLEUADESIS Right 02/23/2018   Procedure: VIDEO ASSISTED THORACOSCOPY (VATS) W/TALC PLEUADESIS;  Surgeon: Nestor Lewandowsky, MD;  Location: [facility];  Service: Thoracic;  Laterality: Right;  . VITRECTOMY Right 2013    FAMILY HISTORY: family history includes Other in his paternal uncle.  SOCIAL HISTORY:  reports that he quit smoking about 8 years ago. He has a 5.00 pack-year smoking history. He has quit using smokeless tobacco.  His smokeless tobacco use included snuff. He reports that he drinks about 3.0 standard drinks of alcohol per week. He reports that he does not use drugs.  ALLERGIES: Patient has no known allergies.  MEDICATIONS:  Current Outpatient Medications  Medication Sig Dispense Refill  . acetaminophen (TYLENOL) 325 MG tablet Take 2 tablets (650 mg total) by mouth every 6 (six) hours as needed for mild pain (or Fever >/= 101).    . B Complex-C (B-COMPLEX WITH VITAMIN C) tablet Take 1 tablet by mouth daily.    Marland Kitchen dicyclomine (BENTYL) 10 MG capsule Take 1 capsule (10 mg total) by mouth 4 (four) times daily -  before meals and at bedtime. 40 capsule 3  . furosemide (LASIX) 20 MG tablet Take 1 tablet (20 mg total) by mouth daily as needed for edema. 30 tablet 6  . ibuprofen (ADVIL,MOTRIN) 200 MG tablet Take 800 mg by mouth daily as needed for mild pain.    Marland Kitchen lidocaine-prilocaine (EMLA) cream Apply 1 application topically as needed. Apply generously over the Mediport 45 minutes prior to chemotherapy. 30 g 0  . loratadine (CLARITIN) 10 MG tablet Take 10 mg by mouth daily as needed for allergies.     Marland Kitchen LORazepam (ATIVAN) 0.5 MG tablet Take 1 tablet (0.5 mg total) by mouth every 8 (eight) hours as needed for anxiety. 45 tablet 0  . metoprolol tartrate (LOPRESSOR) 25 MG tablet Take 1 tablet (25  mg total) by mouth 2 (two) times daily. 60 tablet 2  . morphine (MS CONTIN) 15 MG 12 hr tablet Take 1 tablet (15 mg total) by mouth as needed for pain. 60 tablet 0  . Morphine Sulfate (MORPHINE CONCENTRATE) 10 mg / 0.5 ml concentrated solution Take 0.5 mLs (10 mg total) by mouth every 6 (six) hours as needed for severe pain. 30 mL 0  . Multiple Vitamin (MULTIVITAMIN WITH MINERALS) TABS tablet Take 1 tablet by mouth daily.    Marland Kitchen oxyCODONE-acetaminophen (PERCOCET) 5-325 MG tablet Take 1 tablet by mouth every 12 (twelve) hours as needed for severe pain. 60 tablet 0  . prochlorperazine (COMPAZINE) 10 MG tablet Take 1 tablet (10 mg total) by mouth every 6 (six) hours as needed (Nausea or vomiting). 30 tablet 1  . tadalafil (CIALIS) 5 MG tablet Take 5 mg by mouth daily as needed for erectile dysfunction.    . triamcinolone (NASACORT) 55 MCG/ACT AERO nasal inhaler Place 2 sprays into the nose daily as needed (congestion).     . Vitamin D, Ergocalciferol, (DRISDOL) 50000 units CAPS capsule Take 1 capsule (50,000 Units total) by mouth every 7 (seven) days. (Patient taking differently: Take 50,000 Units by mouth every 7 (seven) days. Mondays) 12 capsule 0  . zolpidem (AMBIEN) 10 MG tablet Take 10 mg by mouth at bedtime as needed for sleep.     Marland Kitchen  aspirin EC 81 MG EC tablet Take 1 tablet (81 mg total) by mouth daily. (Patient not taking: Reported on 08/10/2018) 120 tablet 0  . Diclofenac Sodium 2 % SOLN Apply 1 pump twice daily as needed. (Patient not taking: Reported on 08/16/2018) 112 g 3  . osimertinib mesylate (TAGRISSO) 80 MG tablet Take 1 tablet (80 mg total) by mouth daily. (Patient not taking: Reported on 08/16/2018) 30 tablet 5   No current facility-administered medications for this encounter.     REVIEW OF SYSTEMS:  REVIEW OF SYSTEMS: A 10+ POINT REVIEW OF SYSTEMS WAS OBTAINED including neurology, dermatology, psychiatry, cardiac, respiratory, lymph, extremities, GI, GU, musculoskeletal,  constitutional, reproductive, HEENT. All pertinent positives are noted in the HPI. All others are negative.   PHYSICAL EXAM:  height is 5\' 6"  (1.676 m) and weight is 143 lb (64.9 kg). His oral temperature is 97.6 F (36.4 C). His blood pressure is 119/69 and his pulse is 97. His respiration is 20 and oxygen saturation is 99%.   Lungs are clear to auscultation bilaterally. Heart has regular rate and rhythm. No palpable cervical, supraclavicular, or axillary adenopathy. Abdomen soft, non-tender, normal bowel sounds.  Two masses along the right lateral chest wall fixed to the rib cage, measuring 7 x 5 cm and 3 x 2 cm. No skin erosion or bleeding noted.   ECOG = 1  0 - Asymptomatic (Fully active, able to carry on all predisease activities without restriction)  1 - Symptomatic but completely ambulatory (Restricted in physically strenuous activity but ambulatory and able to carry out work of a light or sedentary nature. For example, light housework, office work)  2 - Symptomatic, <50% in bed during the day (Ambulatory and capable of all self care but unable to carry out any work activities. Up and about more than 50% of waking hours)  3 - Symptomatic, >50% in bed, but not bedbound (Capable of only limited self-care, confined to bed or chair 50% or more of waking hours)  4 - Bedbound (Completely disabled. Cannot carry on any self-care. Totally confined to bed or chair)  5 - Death   Eustace Pen MM, Creech RH, Tormey DC, et al. [phone]). "Toxicity and response criteria of the [facility] [facility]". [place] Oncol. 5 (6): 649-55  LABORATORY DATA:  Lab Results  Component Value Date   WBC 5.0 08/10/2018   HGB 9.3 (L) 08/10/2018   HCT 28.5 (L) 08/10/2018   MCV 86.9 08/10/2018   PLT 137 (L) 08/10/2018   NEUTROABS 3.2 08/10/2018   Lab Results  Component Value Date   NA 135 08/10/2018   K 3.9 08/10/2018   CL 101 08/10/2018   CO2 24 08/10/2018   GLUCOSE 90 08/10/2018   CREATININE  0.86 08/10/2018   CALCIUM 9.1 08/10/2018      RADIOGRAPHY: [place] Intraoperative  Result Date: 08/02/2018 INDICATION: History of stage IV lung cancer now with findings worrisome for significant progression. Please perform CT-guided biopsy for tissue diagnostic purposes to exclude small cell transformation. EXAM: CT AND ULTRASOUND-GUIDED BIOPSY OF INDETERMINATE RIGHT LATERAL CHEST WALL MASS COMPARISON:  CT the chest, abdomen and pelvis - 07/12/2018 MEDICATIONS: None. ANESTHESIA/SEDATION: Fentanyl 75 mcg IV; Versed 2.5 mg IV Sedation time: 12 minutes; The patient was continuously monitored during the procedure by the interventional radiology nurse under my direct supervision. CONTRAST:  None. COMPLICATIONS: None immediate. PROCEDURE: Informed consent was obtained from the patient following an explanation of the procedure, risks, benefits and alternatives. A time out was performed  prior to the initiation of the procedure. The patient was positioned supine on the CT table and a limited CT was performed for procedural planning demonstrating unchanged size and appearance of approximately 3.2 x 2.3 cm nodule along the right lateral chest wall limits 30, series 2). Sonographic evaluation performed of the right lateral chest at this location demonstrating an approximately 2.9 x 1.7 cm hypoechoic nodule. The procedure was planned. The operative site was prepped and draped in the usual sterile fashion. Under direct ultrasound guidance, an 18 gauge core needle biopsy device was utilized to acquire 5 core needle biopsies were obtained of the indeterminate right lateral chest wall subcutaneous nodule. Multiple ultrasound images were saved procedural documentation purposes. Superficial hemostasis was achieved with manual compression and dressing was placed. The patient tolerated the procedure well without immediate postprocedural complication. IMPRESSION: Technically successful CT and ultrasound guided core needle biopsy of  indeterminate right lateral chest wall subcutaneous nodule. Electronically Signed   By: Sandi Mariscal M.D.   On: 08/02/2018 12:06   Ct Biopsy  Result Date: 08/02/2018 INDICATION: History of stage IV lung cancer now with findings worrisome for significant progression. Please perform CT-guided biopsy for tissue diagnostic purposes to exclude small cell transformation. EXAM: CT AND ULTRASOUND-GUIDED BIOPSY OF INDETERMINATE RIGHT LATERAL CHEST WALL MASS COMPARISON:  CT the chest, abdomen and pelvis - 07/12/2018 MEDICATIONS: None. ANESTHESIA/SEDATION: Fentanyl 75 mcg IV; Versed 2.5 mg IV Sedation time: 12 minutes; The patient was continuously monitored during the procedure by the interventional radiology nurse under my direct supervision. CONTRAST:  None. COMPLICATIONS: None immediate. PROCEDURE: Informed consent was obtained from the patient following an explanation of the procedure, risks, benefits and alternatives. A time out was performed prior to the initiation of the procedure. The patient was positioned supine on the CT table and a limited CT was performed for procedural planning demonstrating unchanged size and appearance of approximately 3.2 x 2.3 cm nodule along the right lateral chest wall limits 30, series 2). Sonographic evaluation performed of the right lateral chest at this location demonstrating an approximately 2.9 x 1.7 cm hypoechoic nodule. The procedure was planned. The operative site was prepped and draped in the usual sterile fashion. Under direct ultrasound guidance, an 18 gauge core needle biopsy device was utilized to acquire 5 core needle biopsies were obtained of the indeterminate right lateral chest wall subcutaneous nodule. Multiple ultrasound images were saved procedural documentation purposes. Superficial hemostasis was achieved with manual compression and dressing was placed. The patient tolerated the procedure well without immediate postprocedural complication. IMPRESSION: Technically  successful CT and ultrasound guided core needle biopsy of indeterminate right lateral chest wall subcutaneous nodule. Electronically Signed   By: Sandi Mariscal M.D.   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 Patients had recent progression based on clinical and x-ray findings. Patient has an extremely large mass along the right chest, he is doing surprisingly well given the volume of tumor within the right chest. Patient would be a candidate for additional palliative radiation therapy in attempt to shrink the expansile mass.  PLAN:patient was tentatively set up for CT scan of the chest tomorrow with potential treatments next week but after communication with medical oncology this will be delayed until  review of his current treatment regimen and how this may interact with upcoming radiation therapy.      ------------------------------------------------  Blair Promise, PhD, MD  This document serves as  a record of services personally performed by Gery Pray, MD. It was created on his behalf by Rae Lips, a trained medical scribe. The creation of this record is based on the scribe's personal observations and the provider's statements to them. This document has been checked and approved by the attending provider.

## 2018-08-16 NOTE — Telephone Encounter (Signed)
Mychart message sent.

## 2018-08-16 NOTE — Progress Notes (Signed)
Thoracic Location of Tumor / Histology: Primary cancer of right lower lobe of lung ([place])  stage IV right sided lung cancer with sarcomatoid features, now with marked progression on recent imaging and as such request made for image guided biopsy to evaluate for small cell transformation.   07/12/18 CT CHEST:  IMPRESSION: Significant interval increase in size of tumor throughout the right hemithorax which now involves the lateral aspect of the right chest wall and right chest wall soft tissues. Increased leftward shift of the mediastinal structures.  Biopsies of 08/02/18 Chest wall mass, right lateral  revealed: HIGH-GRADE NON-SMALL CELL CARCINOMA WITH SARCOMATOID FEATURES.   Past/Anticipated interventions by medical oncology, if any: Per [place] 08/10/18:  ASSESSMENT & PLAN:  Primary cancer of right lower lobe of lung ([place]) #Non-small cell lung cancer/stage IV-EGFR mutated exon 19- October 17th CT scan shows significant progression of disease involving the right hemithorax.  Currently on carbotaxol Avastin Tecentriq; s/p #1appx. 3 weeks ago.  #  Concerns for clinical progression based on worsening right chest wall masses.  However, will proceed with current therapy.  # Proceed with cycle #2 today; Labs today reviewed;  acceptable for treatment today. CT scans ordered today/ prior to cycle #3.  Left a message for radiation oncology [place]-for need for possible radiation if noted to have progressive disease on the imaging.  Discussed that unfortunately does not appear to be good.  # Again Reviewed [facility] testing/plasma biopsy-positive for EGFR mutation/already known; no obvious treatable mutations noted/lung Bx- NEGATIVE for small cell.   # Chest wall pain second malignancy; stable.  Morphine 6-8 mg qhs as needed; continue Percocet 5 /325 every 12 hours as needed/MS Contin.   # Epistaxis- ? Avastin; monitor for now; recommend nasal saline spray.   # Insomnia/anxiety continue  Ativan prn.  Stable.  #Forwarded the pathology report to [facility] Ahmed Prima at [facility].  # DISPOSITION # Treatment today- carbo-taxol- avastin-tecentriq. # CT scan prior to next visit-  # Follow up- DEc 6th- MD-cbc/cmp/UA- carbo-taxol- avastin-tecentriq -Dr.B   Signs/Symptoms  Weight changes, if any: since June 2019, 20 pound weight loss  Respiratory complaints, if any: SOB from tumor, occasional non-productive  Hemoptysis, if any: No  Pain issues, if any:  Intermittent, pain medication does help to relieve pain. Pain is right chest, usually in front. Fluctuates between 2-8/10. Achy, occasional throbbing, and occasionally "very penetrating".  SAFETY ISSUES: Prior radiation? Radiation treatment dates:   03/21/18 - 03/23/18 Received two treatments on 03/22/18( 8:30am & 2:55pm)  and 03/23/18 (8:45am & 4:45pm)   Site/dose:   20 Gy directed to the right lung delivered in 5 fractions.  Pacemaker/ICD? No   Possible current pregnancy? No  Is the patient on methotrexate? No  Current Complaints / other details:  Pt presents today for reconsult with Dr. Sondra Come for Radiation Oncology. Pt scored 3/10 on distress screen. SW consult was offered to pt, pt declined. Pt is accompanied by wife, [name].   BP 119/69 (BP Location: Right Arm, Patient Position: Sitting)   Pulse 97   Temp 97.6 F (36.4 C) (Oral)   Resp 20   Ht '5\' 6"'$  (1.676 m)   Wt 143 lb (64.9 kg)   SpO2 99%   BMI 23.08 kg/m   Wt Readings from Last 3 Encounters:  08/16/18 143 lb (64.9 kg)  08/10/18 153 lb (69.4 kg)  07/27/18 153 lb (69.4 kg)   Loma Sousa, RN BSN

## 2018-08-17 ENCOUNTER — Ambulatory Visit: Payer: No Typology Code available for payment source | Admitting: Radiation Oncology

## 2018-08-17 ENCOUNTER — Telehealth: Payer: Self-pay | Admitting: *Deleted

## 2018-08-17 NOTE — Telephone Encounter (Signed)
Please see message below from pt's wife.  "Ruben Eaton had an appointment with the RAD oncologist at [facility], Dr. Sondra Eaton, who oversaw his RAD in June. Ruben Eaton talked to Ruben Eaton about doing radiation to slow down the tumors. Ruben Eaton said not until he has a chance to look into implications since Ruben Eaton has a had Avastin. Is there any way Dr. Jacinto Eaton could call Ruben Eaton and talk to him about it today? I think Ruben Eaton wants the RAD, but I also think he and Dr. B should talk about weighing risks and benefits of both options. Of course we want whatever is going to be most effective."  Please advise.

## 2018-08-17 NOTE — Telephone Encounter (Signed)
Spoke to patient's wife-regarding radiation.   Patient received last chemo immunotherapy on 11/15.   Okay to proceed with radiation at this time; understands the risk of radiation pneumonitis; however at this time benefits outweigh the risk.  Also discussed with Dr. Ahmed Prima at [facility].  I will inform Dr. Dwana Curd regarding the above.   Follow up with me as planned.   GB

## 2018-08-19 ENCOUNTER — Ambulatory Visit
Admission: RE | Admit: 2018-08-19 | Discharge: 2018-08-19 | Disposition: A | Discharge status: Home / Self Care | Payer: No Typology Code available for payment source | Source: Ambulatory Visit | Attending: Radiation Oncology | Admitting: Radiation Oncology

## 2018-08-19 DIAGNOSIS — C3431 Malignant neoplasm of lower lobe, right bronchus or lung: Secondary | ICD-10-CM

## 2018-08-19 NOTE — Progress Notes (Signed)
  Radiation Oncology         [phone] ________________________________  Name: Ruben Eaton MRN: [id number]  Date: 08/19/2018  DOB: November 12, 1970  SIMULATION AND TREATMENT PLANNING NOTE    ICD-10-CM   1. 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 Cancer Staging Primary cancer of right lower lobe of lung ([place]) Staging form: Lung, AJCC 8th Edition - Clinical stage from 03/02/2018: Stage IV (cT4, cN0, pM1a) - Unsigned   NARRATIVE:  The patient was brought to the [facility].  Identity was confirmed.  All relevant records and images related to the planned course of therapy were reviewed.  The patient freely provided informed written consent to proceed with treatment after reviewing the details related to the planned course of therapy. The consent form was witnessed and verified by the simulation staff.  Then, the patient was set-up in a stable reproducible  supine position for radiation therapy.  CT images were obtained.  Surface markings were placed.  The CT images were loaded into the planning software.  Then the target and avoidance structures were contoured.  Treatment planning then occurred.  The radiation prescription was entered and confirmed.  Then, I designed and supervised the construction of a total of 4 medically necessary complex treatment devices.  I have requested : 3D Simulation  I have requested a DVH of the following structures: heart, lungs, esophagus, GTV, PTV.  I have ordered:dose calc.  Patient would be at risk for radiation pneumonitis given his recent chemotherapy agents. This was discussed with the patient by medical oncology as well as myself and outside discussion with his medical oncologist at [facility]. Unfortunately the patient does not have any other options at this time. On scans today it would appear the patient has very little functioning lung in the right  chest. His upcoming radiation fields will treat very little if any of his left lung.  PLAN:  The patient will receive 30 Gy in 10 fractions.  -----------------------------------  Blair Promise, PhD, MD

## 2018-08-20 ENCOUNTER — Ambulatory Visit
Admission: RE | Admit: 2018-08-20 | Discharge: 2018-08-20 | Disposition: A | Discharge status: Home / Self Care | Payer: No Typology Code available for payment source | Source: Ambulatory Visit | Attending: Radiation Oncology | Admitting: Radiation Oncology

## 2018-08-20 DIAGNOSIS — C3431 Malignant neoplasm of lower lobe, right bronchus or lung: Secondary | ICD-10-CM | POA: Diagnosis not present

## 2018-08-21 ENCOUNTER — Ambulatory Visit
Admission: RE | Admit: 2018-08-21 | Discharge: 2018-08-21 | Disposition: A | Discharge status: Home / Self Care | Payer: No Typology Code available for payment source | Source: Ambulatory Visit | Attending: Radiation Oncology | Admitting: Radiation Oncology

## 2018-08-21 DIAGNOSIS — C3431 Malignant neoplasm of lower lobe, right bronchus or lung: Secondary | ICD-10-CM | POA: Diagnosis not present

## 2018-08-21 MED ORDER — SONAFINE EX EMUL
1.0000 "application " | Freq: Two times a day (BID) | CUTANEOUS | Status: DC
  Administered 2018-08-21: 1 via TOPICAL

## 2018-08-22 ENCOUNTER — Ambulatory Visit
Admission: RE | Admit: 2018-08-22 | Discharge: 2018-08-22 | Disposition: A | Discharge status: Home / Self Care | Payer: No Typology Code available for payment source | Source: Ambulatory Visit | Attending: Radiation Oncology | Admitting: Radiation Oncology

## 2018-08-22 DIAGNOSIS — C3431 Malignant neoplasm of lower lobe, right bronchus or lung: Secondary | ICD-10-CM | POA: Diagnosis not present

## 2018-08-27 ENCOUNTER — Ambulatory Visit
Admission: RE | Admit: 2018-08-27 | Discharge: 2018-08-27 | Disposition: A | Discharge status: Home / Self Care | Payer: No Typology Code available for payment source | Source: Ambulatory Visit | Attending: Radiation Oncology | Admitting: Radiation Oncology

## 2018-08-27 ENCOUNTER — Encounter: Payer: Self-pay | Admitting: Internal Medicine

## 2018-08-27 DIAGNOSIS — C3431 Malignant neoplasm of lower lobe, right bronchus or lung: Secondary | ICD-10-CM | POA: Insufficient documentation

## 2018-08-27 DIAGNOSIS — Z923 Personal history of irradiation: Secondary | ICD-10-CM | POA: Diagnosis not present

## 2018-08-28 ENCOUNTER — Encounter: Payer: Self-pay | Admitting: Emergency Medicine

## 2018-08-28 ENCOUNTER — Telehealth: Payer: Self-pay | Admitting: Internal Medicine

## 2018-08-28 ENCOUNTER — Encounter: Payer: Self-pay | Admitting: Internal Medicine

## 2018-08-28 ENCOUNTER — Other Ambulatory Visit: Payer: Self-pay

## 2018-08-28 ENCOUNTER — Ambulatory Visit
Admission: RE | Admit: 2018-08-28 | Discharge: 2018-08-28 | Disposition: A | Discharge status: Home / Self Care | Payer: No Typology Code available for payment source | Source: Ambulatory Visit | Attending: Radiation Oncology | Admitting: Radiation Oncology

## 2018-08-28 ENCOUNTER — Emergency Department
Admission: EM | Admit: 2018-08-28 | Discharge: 2018-08-28 | Disposition: A | Discharge status: Home / Self Care | Payer: No Typology Code available for payment source | Attending: Emergency Medicine | Admitting: Emergency Medicine

## 2018-08-28 ENCOUNTER — Ambulatory Visit
Admission: RE | Admit: 2018-08-28 | Discharge: 2018-08-28 | Disposition: A | Discharge status: Home / Self Care | Payer: No Typology Code available for payment source | Source: Ambulatory Visit | Attending: Internal Medicine | Admitting: Internal Medicine

## 2018-08-28 ENCOUNTER — Ambulatory Visit: Payer: No Typology Code available for payment source

## 2018-08-28 ENCOUNTER — Emergency Department: Payer: No Typology Code available for payment source

## 2018-08-28 DIAGNOSIS — Z9981 Dependence on supplemental oxygen: Secondary | ICD-10-CM | POA: Insufficient documentation

## 2018-08-28 DIAGNOSIS — I10 Essential (primary) hypertension: Secondary | ICD-10-CM | POA: Diagnosis not present

## 2018-08-28 DIAGNOSIS — C3431 Malignant neoplasm of lower lobe, right bronchus or lung: Secondary | ICD-10-CM | POA: Insufficient documentation

## 2018-08-28 DIAGNOSIS — I2694 Multiple subsegmental pulmonary emboli without acute cor pulmonale: Secondary | ICD-10-CM | POA: Diagnosis present

## 2018-08-28 DIAGNOSIS — C3490 Malignant neoplasm of unspecified part of unspecified bronchus or lung: Secondary | ICD-10-CM | POA: Insufficient documentation

## 2018-08-28 DIAGNOSIS — Z87891 Personal history of nicotine dependence: Secondary | ICD-10-CM | POA: Insufficient documentation

## 2018-08-28 DIAGNOSIS — R0602 Shortness of breath: Secondary | ICD-10-CM | POA: Insufficient documentation

## 2018-08-28 LAB — CBC WITH DIFFERENTIAL/PLATELET
Abs Immature Granulocytes: 0.01 10*3/uL (ref 0.00–0.07)
Basophils Absolute: 0 10*3/uL (ref 0.0–0.1)
Basophils Relative: 1 %
EOS PCT: 1 %
Eosinophils Absolute: 0 10*3/uL (ref 0.0–0.5)
HCT: 28.9 % — ABNORMAL LOW (ref 39.0–52.0)
Hemoglobin: 9.3 g/dL — ABNORMAL LOW (ref 13.0–17.0)
Immature Granulocytes: 0 %
Lymphocytes Relative: 19 %
Lymphs Abs: 0.6 10*3/uL — ABNORMAL LOW (ref 0.7–4.0)
MCH: 28.7 pg (ref 26.0–34.0)
MCHC: 32.2 g/dL (ref 30.0–36.0)
MCV: 89.2 fL (ref 80.0–100.0)
MONO ABS: 0.6 10*3/uL (ref 0.1–1.0)
Monocytes Relative: 19 %
Neutro Abs: 1.9 10*3/uL (ref 1.7–7.7)
Neutrophils Relative %: 60 %
Platelets: 191 10*3/uL (ref 150–400)
RBC: 3.24 MIL/uL — AB (ref 4.22–5.81)
RDW: 19.5 % — ABNORMAL HIGH (ref 11.5–15.5)
WBC: 3.1 10*3/uL — AB (ref 4.0–10.5)
nRBC: 0 % (ref 0.0–0.2)

## 2018-08-28 LAB — COMPREHENSIVE METABOLIC PANEL
ALT: 9 U/L (ref 0–44)
AST: 21 U/L (ref 15–41)
Albumin: 2.7 g/dL — ABNORMAL LOW (ref 3.5–5.0)
Alkaline Phosphatase: 81 U/L (ref 38–126)
Anion gap: 11 (ref 5–15)
BUN: 11 mg/dL (ref 6–20)
CALCIUM: 8.7 mg/dL — AB (ref 8.9–10.3)
CO2: 22 mmol/L (ref 22–32)
Chloride: 99 mmol/L (ref 98–111)
Creatinine, Ser: 0.66 mg/dL (ref 0.61–1.24)
GFR calc non Af Amer: >60 mL/min (ref >60)
Glucose, Bld: 86 mg/dL (ref 70–99)
Potassium: 4 mmol/L (ref 3.5–5.1)
Sodium: 132 mmol/L — ABNORMAL LOW (ref 135–145)
Total Bilirubin: 1.1 mg/dL (ref 0.3–1.2)
Total Protein: 5.7 g/dL — ABNORMAL LOW (ref 6.5–8.1)

## 2018-08-28 LAB — APTT: aPTT: 35 seconds (ref 24–36)

## 2018-08-28 LAB — TROPONIN I: Troponin I: <0.03 ng/mL (ref <0.03)

## 2018-08-28 MED ORDER — ENOXAPARIN SODIUM 30 MG/0.3ML ~~LOC~~ SOLN: 1.0000 mg/kg | Freq: Two times a day (BID) | SUBCUTANEOUS | 3 refills | Status: DC

## 2018-08-28 MED ORDER — ENOXAPARIN SODIUM 80 MG/0.8ML ~~LOC~~ SOLN
1.0000 mg/kg | Freq: Once | SUBCUTANEOUS | Status: AC
  Administered 2018-08-28: 65 mg via SUBCUTANEOUS
  Filled 2018-08-28: qty 0.8

## 2018-08-28 MED ORDER — ENOXAPARIN SODIUM 30 MG/0.3ML ~~LOC~~ SOLN: 1.0000 mg/kg | Freq: Two times a day (BID) | SUBCUTANEOUS | 11 refills | Status: DC

## 2018-08-28 MED ORDER — MORPHINE SULFATE (PF) 2 MG/ML IV SOLN
2.0000 mg | Freq: Once | INTRAVENOUS | Status: AC
  Administered 2018-08-28: 2 mg via INTRAVENOUS
  Filled 2018-08-28: qty 1

## 2018-08-28 MED ORDER — IOPAMIDOL (ISOVUE-300) INJECTION 61%
75.0000 mL | Freq: Once | INTRAVENOUS | Status: AC | PRN
  Administered 2018-08-28: 75 mL via INTRAVENOUS

## 2018-08-28 MED ORDER — HEPARIN SOD (PORK) LOCK FLUSH 100 UNIT/ML IV SOLN
500.0000 [IU] | Freq: Once | INTRAVENOUS | Status: AC
  Administered 2018-08-28: 500 [IU] via INTRAVENOUS
  Filled 2018-08-28: qty 5

## 2018-08-28 NOTE — ED Triage Notes (Signed)
Pt was called by oncologist after CT reading from this morning with a pulmonary embolism finding. Pt states his Ct was a routine follow up. Pt denies pain or acute shortness of breath

## 2018-08-28 NOTE — Telephone Encounter (Signed)
Spoke to ER Dr. patient clinically stable.  Patient could be discharged home on Lovenox 1 mg/kg twice daily subcu dosing.  Patient will continue radiation; left a message for Dr.kinard.   Patient has appointment to see me on December 5th.

## 2018-08-28 NOTE — Telephone Encounter (Signed)
Discussed with Dr. Sondra Come, [place] regarding the recent PE noted on the CT scan/the fact the patient might be admitted to the hospital for anticoagulation.  We will plan to hold radiation today.  Patient awaiting to be seen at the emergency room. My preference would be Lovenox 1 mg/kg subcu twice daily.

## 2018-08-28 NOTE — Telephone Encounter (Signed)
Got a call from the radiology regarding PE in the left lung.  Recommend patient go to the hospital.  He agrees to go to the emergency room.   Patient supposed to get fax #5 radiation today; with Dr.Kinard; [place].  Will speak to Dr. Sondra Come re: logistics.

## 2018-08-28 NOTE — ED Notes (Signed)
Patient transported to Ultrasound 

## 2018-08-28 NOTE — ED Notes (Signed)
FIRST NURSE NOTE: Pt presents from [facility] for positive PE on Ct scan this morning.

## 2018-08-28 NOTE — ED Provider Notes (Addendum)
[facility] Emergency Department Provider Note       Time seen: ----------------------------------------- 11:40 AM on 08/28/2018 -----------------------------------------   I have reviewed the triage vital signs and the nursing notes.  HISTORY   Chief Complaint Pulmonary Embolism    HPI Ruben Eaton is a 47 y.o. male with a history of CVA, hypertension, primary lung cancer, PVCs, TIA who presents to the ED for pulmonary embolus.  Patient presents for the cancer center for positive PE on CT scan obtained this morning as an outpatient.  He denies any pain or acute shortness of breath.  He wears 1 L of nasal cannula oxygen as needed.  He denies fevers, chills or other complaints.  Past Medical History:  Diagnosis Date  . 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 09/15/2016 in setting of PFO.  . CVA (cerebral vascular accident) ([place]) 09/07/2016  . ED (erectile dysfunction)   . Essential (primary) hypertension 02/01/2016  . History of stroke 10/08/2016  . Hx of insomnia   . Hypertension   . OSA on CPAP   . PFO (patent foramen ovale)    a. s/p closure 09/2016 by Dr. [name].  . Pleural effusion on right 02/21/2018  . Primary cancer of right lower lobe of lung ([place]) 02/25/2018   Chemo tx's and rad tx's  . PVC's (premature ventricular contractions) 10/08/2016  . 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 TIA (transient ischemic attack) 09/07/2016    Patient Active Problem List   Diagnosis Date Noted  . Hypertension   . 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 Non-small cell cancer of left lung ([place]) 03/22/2018  . Anasarca 03/21/2018  . Acute respiratory failure with hypoxia ([place]) 03/21/2018  . Pure hypercholesterolemia 03/08/2018  . Goals of care, counseling/discussion 03/02/2018  . Anxiety 03/02/2018  . Primary cancer of right lower lobe of lung ([place]) 02/25/2018  . Pleural effusion 02/21/2018  . Vaccine counseling 09/01/2017  . OSA on CPAP 10/27/2016  .  History of stroke 10/08/2016  . PVC's (premature ventricular contractions) 10/08/2016  . PFO (patent foramen ovale) 10/07/2016  . TIA (transient ischemic attack) 09/07/2016  . CVA (cerebral vascular accident) ([place]) 09/07/2016  . 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 Essential (primary) hypertension 02/01/2016  . H/O disease 02/01/2016  . Quadriceps tendinitis 02/01/2016    Past Surgical History:  Procedure Laterality Date  . APPENDECTOMY  1980  . EYE SURGERY    . PATENT FORAMEN OVALE CLOSURE  10/07/2016  . PATENT FORAMEN OVALE CLOSURE N/A 10/07/2016   Procedure: Patent Forament Ovale(PFO) Closure;  Surgeon: [name], MD;  Location: [place] [facility];  Service: Cardiovascular;  Laterality: N/A;  . PORTACATH PLACEMENT N/A 02/23/2018   Procedure: INSERTION PORT-A-CATH;  Surgeon: Nestor Lewandowsky, MD;  Location: [facility] ORS;  Service: Thoracic;  Laterality: N/A;  . STRABISMUS SURGERY Left 1984  . TEE WITHOUT CARDIOVERSION N/A 09/08/2016   Procedure: TRANSESOPHAGEAL ECHOCARDIOGRAM (TEE);  Surgeon: [name], MD;  Location: [facility] ORS;  Service: Cardiovascular;  Laterality: N/A;  . VIDEO ASSISTED THORACOSCOPY (VATS) W/TALC PLEUADESIS Right 02/23/2018   Procedure: VIDEO ASSISTED THORACOSCOPY (VATS) W/TALC PLEUADESIS;  Surgeon: Nestor Lewandowsky, MD;  Location: [facility] ORS;  Service: Thoracic;  Laterality: Right;  . VITRECTOMY Right 2013    Allergies Patient has no known allergies.  Social History Social History   Tobacco Use  . Smoking status: Former Smoker    Packs/day: 0.50    Years: 10.00    Pack years:  5.00    Last attempt to quit: 2011    Years since quitting: 8.9  . Smokeless tobacco: Former Systems developer    Types: Snuff  Substance Use Topics  . Alcohol use: Yes    Alcohol/week: 3.0 standard drinks    Types: 3 Cans of beer per week  . Drug use: No   Review of Systems Constitutional: Negative for fever. Cardiovascular: Negative for chest pain. Respiratory:  Positive for chronic shortness of breath, nothing acute Gastrointestinal: Negative for abdominal pain, vomiting and diarrhea. Musculoskeletal: Negative for back pain.  Negative for leg swelling Skin: Negative for rash. Neurological: Negative for headaches, focal weakness or numbness.  All systems negative/normal/unremarkable except as stated in the HPI  ____________________________________________   PHYSICAL EXAM:  VITAL SIGNS: ED Triage Vitals  Enc Vitals Group     BP 08/28/18 1018 122/77     Pulse Rate 08/28/18 1018 (!) 108     Resp 08/28/18 1018 18     Temp 08/28/18 1018 (!) 97.4 F (36.3 C)     Temp Source 08/28/18 1018 Oral     SpO2 08/28/18 1018 99 %     Weight 08/28/18 1019 143 lb (64.9 kg)     Height 08/28/18 1019 5\' 6"  (1.676 m)     Head Circumference --      Peak Flow --      Pain Score 08/28/18 1019 0     Pain Loc --      Pain Edu? --      Excl. in [place]? --    Constitutional: Alert and oriented.  Chronically ill-appearing, no distress Eyes: Conjunctivae are normal. Normal extraocular movements. Cardiovascular: Normal rate, regular rhythm. No murmurs, rubs, or gallops. Respiratory: Normal respiratory effort without tachypnea nor retractions. Breath sounds are mostly clear bilaterally Gastrointestinal: Soft and nontender. Normal bowel sounds Musculoskeletal: Nontender with normal range of motion in extremities. No lower extremity tenderness nor edema. Neurologic:  Normal speech and language. No gross focal neurologic deficits are appreciated.  Skin:  Skin is warm, dry and intact. No rash noted. Psychiatric: Mood and affect are normal. Speech and behavior are normal.  ____________________________________________  EKG: Interpreted by me.  Sinus tachycardia rate of 106 bpm, possible septal infarct age-indeterminate, long QT, normal axis, nonspecific T wave inversions  ____________________________________________  ED COURSE:  As part of my medical decision making,  I reviewed the following data within the [place] History obtained from family if available, nursing notes, old chart and ekg, as well as notes from prior ED visits. Patient presented for pulmonary emboli, we will assess with labs and imaging as indicated at this time.   Procedures ____________________________________________   LABS (pertinent positives/negatives)  Labs Reviewed  CBC WITH DIFFERENTIAL/PLATELET - Abnormal; Notable for the following components:      Result Value   WBC 3.1 (*)    RBC 3.24 (*)    Hemoglobin 9.3 (*)    HCT 28.9 (*)    RDW 19.5 (*)    Lymphs Abs 0.6 (*)    All other components within normal limits  COMPREHENSIVE METABOLIC PANEL  TROPONIN I  APTT    RADIOLOGY Images were viewed by me CT chest IMPRESSION: 1. Exam positive for acute pulmonary emboli to the lingula and left lower lobe lobar and segmental pulmonary arteries. Critical Value/emergent results were called by telephone at the time of interpretation on 08/28/2018 at 9:23 am to Dr. Charlaine Dalton , who verbally acknowledged these results. 2. Again seen is  extensive tumor burden within the right hemithorax. As noted previously there is tumor extension into the posterior mediastinum which pushes the heart and esophagus into the left hemithorax. There is also tumor extension into the right lateral chest wall. Although there is mild improved aeration to the right lower lobe and right upper lobe the overall tumor burden is not significantly improved in the interval.  Ultrasound is negative for DVT ____________________________________________  DIFFERENTIAL DIAGNOSIS   PE, DVT, lung CA  FINAL ASSESSMENT AND PLAN  Pulmonary embolus   Plan: The patient had presented for pulmonary embolus that was diagnosed as an outpatient. Patient's labs did not reveal any acute interval change. Patient's imaging was positive for pulmonary emboli to the left lower lobe and segmental  pulmonary arteries.  We did start him on Lovenox, I discussed with oncology.  He will follow-up on Thursday as an outpatient on Lovenox.  The patient prefers not to be admitted to the hospital.  He is not symptomatic or hypoxic at this time.   Laurence Aly, MD   Note: This note was generated in part or whole with voice recognition software. Voice recognition is usually quite accurate but there are transcription errors that can and very often do occur. I apologize for any typographical errors that were not detected and corrected.     Earleen Newport, MD 08/28/18 1143    Earleen Newport, MD 08/28/18 [phone]

## 2018-08-29 ENCOUNTER — Ambulatory Visit
Admission: RE | Admit: 2018-08-29 | Discharge: 2018-08-29 | Disposition: A | Discharge status: Home / Self Care | Payer: No Typology Code available for payment source | Source: Ambulatory Visit | Attending: Radiation Oncology | Admitting: Radiation Oncology

## 2018-08-29 DIAGNOSIS — C3431 Malignant neoplasm of lower lobe, right bronchus or lung: Secondary | ICD-10-CM | POA: Diagnosis not present

## 2018-08-30 ENCOUNTER — Encounter: Payer: Self-pay | Admitting: Internal Medicine

## 2018-08-30 ENCOUNTER — Inpatient Hospital Stay: Payer: No Typology Code available for payment source

## 2018-08-30 ENCOUNTER — Ambulatory Visit
Admission: RE | Admit: 2018-08-30 | Discharge: 2018-08-30 | Disposition: A | Discharge status: Home / Self Care | Payer: No Typology Code available for payment source | Source: Ambulatory Visit | Attending: Radiation Oncology | Admitting: Radiation Oncology

## 2018-08-30 ENCOUNTER — Inpatient Hospital Stay: Payer: No Typology Code available for payment source | Attending: Internal Medicine | Admitting: Internal Medicine

## 2018-08-30 ENCOUNTER — Other Ambulatory Visit: Payer: Self-pay

## 2018-08-30 VITALS — BP 101/69 | HR 105 | Temp 97.9°F | Resp 18 | Ht 66.0 in | Wt 144.0 lb

## 2018-08-30 DIAGNOSIS — Z9981 Dependence on supplemental oxygen: Secondary | ICD-10-CM | POA: Insufficient documentation

## 2018-08-30 DIAGNOSIS — Z79899 Other long term (current) drug therapy: Secondary | ICD-10-CM | POA: Diagnosis not present

## 2018-08-30 DIAGNOSIS — R040 Epistaxis: Secondary | ICD-10-CM | POA: Diagnosis not present

## 2018-08-30 DIAGNOSIS — J90 Pleural effusion, not elsewhere classified: Secondary | ICD-10-CM | POA: Diagnosis not present

## 2018-08-30 DIAGNOSIS — Z5112 Encounter for antineoplastic immunotherapy: Secondary | ICD-10-CM | POA: Diagnosis not present

## 2018-08-30 DIAGNOSIS — G4733 Obstructive sleep apnea (adult) (pediatric): Secondary | ICD-10-CM | POA: Insufficient documentation

## 2018-08-30 DIAGNOSIS — R222 Localized swelling, mass and lump, trunk: Secondary | ICD-10-CM | POA: Diagnosis not present

## 2018-08-30 DIAGNOSIS — Z791 Long term (current) use of non-steroidal anti-inflammatories (NSAID): Secondary | ICD-10-CM | POA: Diagnosis not present

## 2018-08-30 DIAGNOSIS — Z9989 Dependence on other enabling machines and devices: Secondary | ICD-10-CM | POA: Diagnosis not present

## 2018-08-30 DIAGNOSIS — Z7901 Long term (current) use of anticoagulants: Secondary | ICD-10-CM | POA: Insufficient documentation

## 2018-08-30 DIAGNOSIS — I10 Essential (primary) hypertension: Secondary | ICD-10-CM | POA: Insufficient documentation

## 2018-08-30 DIAGNOSIS — Z8673 Personal history of transient ischemic attack (TIA), and cerebral infarction without residual deficits: Secondary | ICD-10-CM | POA: Insufficient documentation

## 2018-08-30 DIAGNOSIS — C3431 Malignant neoplasm of lower lobe, right bronchus or lung: Secondary | ICD-10-CM | POA: Diagnosis not present

## 2018-08-30 DIAGNOSIS — Z5111 Encounter for antineoplastic chemotherapy: Secondary | ICD-10-CM | POA: Diagnosis present

## 2018-08-30 DIAGNOSIS — E46 Unspecified protein-calorie malnutrition: Secondary | ICD-10-CM | POA: Insufficient documentation

## 2018-08-30 DIAGNOSIS — Z87891 Personal history of nicotine dependence: Secondary | ICD-10-CM | POA: Insufficient documentation

## 2018-08-30 DIAGNOSIS — R5381 Other malaise: Secondary | ICD-10-CM | POA: Insufficient documentation

## 2018-08-30 DIAGNOSIS — R5383 Other fatigue: Secondary | ICD-10-CM | POA: Diagnosis not present

## 2018-08-30 DIAGNOSIS — D649 Anemia, unspecified: Secondary | ICD-10-CM | POA: Diagnosis not present

## 2018-08-30 DIAGNOSIS — Z86711 Personal history of pulmonary embolism: Secondary | ICD-10-CM | POA: Diagnosis not present

## 2018-08-30 DIAGNOSIS — Z923 Personal history of irradiation: Secondary | ICD-10-CM | POA: Diagnosis not present

## 2018-08-30 NOTE — Assessment & Plan Note (Addendum)
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  Discussed that although CT scan is stable; the fact that patient continues to have significant bulky disease/with adequate response to the most aggressive therapy-unfortunately prognosis is poor.  #Continue with carbotaxol Avastin tomorrow; labs adequate.  Hold Tecentriq given radiation to the chest/lung.  Patient will finish radiation on December 10.  Discussed with Dr. Sondra Come.   # Chest wall pain second malignancy;  Morphine 6-8 mg qhs as needed; continue Percocet 5 /325 every 12 hours as needed/MS Contin.  Stable.  Please call for prescription.  # Epistaxis-question Avastin/oxygen.  Recommend nasal saline spray.  #Left lingular/segmental PE-current on Lovenox 1 mg/kg twice a day.  Recommend at least 3 months; and then transition to novel oral anticoagulation.  Patient will likely need indefinite anticoagulation.  #Okay with intermittent FMLA for work.  # DISPOSITION # Treatment tomorrow; carbo-taxol-avastin;UA; NO Tecentriq.  # follow up in 3 weeks/labs/MD;cbc/cmp/UA d-2- carbo-taxol-avastin-tecentriq.-Dr.B  # I reviewed the blood work- with the patient in detail; also reviewed the imaging independently [as summarized above]; and with the patient in detail.

## 2018-08-30 NOTE — Progress Notes (Signed)
[place] OFFICE PROGRESS NOTE  Patient Care Team: Dion Body, MD as PCP - General (Family Medicine) [name], MD as PCP - Cardiology (Cardiology) Telford Nab, RN as Registered Nurse  Cancer Staging Primary cancer of right lower lobe of lung [facility]) Staging form: Lung, AJCC 8th Edition - Clinical stage from 03/02/2018: Stage IV (cT4, cN0, pM1a) - Unsigned    Oncology History   # June 2019- LUNG CA- HIGH GRADE CA with sarcomatoid features [EGFR MUTATED; exon 19; clinically non-small cell];Dr.Jeffrey Carlis Abbott; II opinion at [facility];  # June 17th- carbo-taxol #1; PROGRESSION June 26th 2019- Osimertinib 49m/day; [facility] June [facility], [facility]]; reirradiation right chest wall finished radiation December 10th [Dr.Kinard]  # Oct 2019- Severe progression in chest; START carbo-Taxol-Tecentriq-Avastin  # Repeat chest Bx- Nov 2019- high grade Non-small cell with sarcomatoid features  # Dec 3rd 2019- Left Lung PE- lovenox  # HTN/ cryptogenic stroke;  # MOLECULAR TESTING- EGFR exon 19 deletion [omniseq]; Oct 2019- Plasma/guadant tetsing- EGFR exon 19del*   DIAGNOSIS: non-small cell lung ca  STAGE:  IV       ;GOALS: palliative  CURRENT/MOST RECENT THERAPY- June 26th OSIMERTINIB       [place] of lung [place]      INTERVAL HISTORY:  Ruben [id number].  male pleasant patient above history of metastatic adenocarcinoma the lung/EGFR mutated is currently carbotaxol Tecentriq plus Avastin is here for follow-up status post 2 cycles is here to review this of the restaging CAT scan.  In the interim incidentally on the CT scan patient noted to have left lingular/segmental PE.  Patient started on Lovenox 1 mg/kg in the ER.  He continues to complain of right chest wall pain need to take liquid morphine/Percocet.  Also taking MS Contin.  Intermittent nosebleeds; he continues to use oxygen 24/7.   Review of Systems   Constitutional: Positive for malaise/fatigue. Negative for chills, diaphoresis, fever and weight loss.  HENT: Negative for nosebleeds and sore throat.   Eyes: Negative for double vision.  Respiratory: Positive for cough and shortness of breath. Negative for hemoptysis, sputum production and wheezing.   Cardiovascular: Positive for chest pain (Right chest wall pain.). Negative for palpitations, orthopnea and leg swelling.  Gastrointestinal: Negative for blood in stool, constipation, heartburn, melena, nausea and vomiting.  Genitourinary: Negative for dysuria, frequency and urgency.  Musculoskeletal: Negative for back pain and joint pain.  Skin: Negative.  Negative for itching and rash.  Neurological: Negative for dizziness, tingling, focal weakness, weakness and headaches.  Endo/Heme/Allergies: Does not bruise/bleed easily.  Psychiatric/Behavioral: Negative for depression. The patient is not nervous/anxious and does not have insomnia.       PAST MEDICAL HISTORY :  Past Medical History:  Diagnosis Date  . Cryptogenic stroke ([place] 08/2016   a. 09/15/2016 in setting of PFO.  . CVA (cerebral vascular accident) ([place] 09/07/2016  . ED (erectile dysfunction)   . Essential (primary) hypertension 02/01/2016  . History of stroke 10/08/2016  . Hx of insomnia   . Hypertension   . OSA on CPAP   . PFO (patent foramen ovale)    a. s/p closure 09/2016 by Dr. CBurt Knack  . Pleural effusion on right 02/21/2018  . Primary cancer of right lower lobe of lung ([place] 02/25/2018   Chemo tx's and rad tx's  . PVC's (premature ventricular contractions) 10/08/2016  . 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 TIA (transient ischemic attack) 09/07/2016  PAST SURGICAL HISTORY :   Past Surgical History:  Procedure Laterality Date  . APPENDECTOMY  1980  . EYE SURGERY    . PATENT FORAMEN OVALE CLOSURE  10/07/2016  . PATENT FORAMEN OVALE CLOSURE N/A 10/07/2016   Procedure: Patent Forament [place](PFO)  Closure;  Surgeon: [name], MD;  Location: [place] [facility];  Service: Cardiovascular;  Laterality: N/A;  . PORTACATH PLACEMENT N/A 02/23/2018   Procedure: INSERTION PORT-A-CATH;  Surgeon: Nestor Lewandowsky, MD;  Location: [facility] ORS;  Service: Thoracic;  Laterality: N/A;  . STRABISMUS SURGERY Left 1984  . TEE WITHOUT CARDIOVERSION N/A 09/08/2016   Procedure: TRANSESOPHAGEAL ECHOCARDIOGRAM (TEE);  Surgeon: [name], MD;  Location: [facility] ORS;  Service: Cardiovascular;  Laterality: N/A;  . VIDEO ASSISTED THORACOSCOPY (VATS) W/TALC PLEUADESIS Right 02/23/2018   Procedure: VIDEO ASSISTED THORACOSCOPY (VATS) W/TALC PLEUADESIS;  Surgeon: Nestor Lewandowsky, MD;  Location: [facility] ORS;  Service: Thoracic;  Laterality: Right;  . VITRECTOMY Right 2013    FAMILY HISTORY :   Family History  Problem Relation Age of Onset  . Other Paternal Uncle        Abdominal tumor     SOCIAL HISTORY:   Social History   Tobacco Use  . Smoking status: Former Smoker    Packs/day: 0.50    Years: 10.00    Pack years: 5.00    Last attempt to quit: 2011    Years since quitting: 8.9  . Smokeless tobacco: Former Systems developer    Types: Snuff  Substance Use Topics  . Alcohol use: Yes    Alcohol/week: 3.0 standard drinks    Types: 3 Cans of beer per week  . Drug use: No    ALLERGIES:  has No Known Allergies.  MEDICATIONS:  Current Outpatient Medications  Medication Sig Dispense Refill  . acetaminophen (TYLENOL) 325 MG tablet Take 2 tablets (650 mg total) by mouth every 6 (six) hours as needed for mild pain (or Fever >/= 101).    . B Complex-C (B-COMPLEX WITH VITAMIN C) tablet Take 1 tablet by mouth daily.    Marland Kitchen dicyclomine (BENTYL) 10 MG capsule Take 1 capsule (10 mg total) by mouth 4 (four) times daily -  before meals and at bedtime. 40 capsule 3  . enoxaparin (LOVENOX) 30 MG/0.3ML injection Inject 0.65 mLs (65 mg total) into the skin 2 (two) times daily. 117 mL 3  . furosemide (LASIX) 20 MG tablet Take 1 tablet (20  mg total) by mouth daily as needed for edema. 30 tablet 6  . lidocaine-prilocaine (EMLA) cream Apply 1 application topically as needed. Apply generously over the Mediport 45 minutes prior to chemotherapy. 30 g 0  . loratadine (CLARITIN) 10 MG tablet Take 10 mg by mouth daily as needed for allergies.     Marland Kitchen LORazepam (ATIVAN) 0.5 MG tablet Take 1 tablet (0.5 mg total) by mouth every 8 (eight) hours as needed for anxiety. 45 tablet 0  . metoprolol tartrate (LOPRESSOR) 25 MG tablet Take 1 tablet (25 mg total) by mouth 2 (two) times daily. 60 tablet 2  . morphine (MS CONTIN) 15 MG 12 hr tablet Take 1 tablet (15 mg total) by mouth as needed for pain. 60 tablet 0  . Morphine Sulfate (MORPHINE CONCENTRATE) 10 mg / 0.5 ml concentrated solution Take 0.5 mLs (10 mg total) by mouth every 6 (six) hours as needed for severe pain. 30 mL 0  . Multiple Vitamin (MULTIVITAMIN WITH MINERALS) TABS tablet Take 1 tablet by mouth daily.    Marland Kitchen  oxyCODONE-acetaminophen (PERCOCET) 5-325 MG tablet Take 1 tablet by mouth every 12 (twelve) hours as needed for severe pain. 60 tablet 0  . prochlorperazine (COMPAZINE) 10 MG tablet Take 1 tablet (10 mg total) by mouth every 6 (six) hours as needed (Nausea or vomiting). 30 tablet 1  . tadalafil (CIALIS) 5 MG tablet Take 5 mg by mouth daily as needed for erectile dysfunction.    . triamcinolone (NASACORT) 55 MCG/ACT AERO nasal inhaler Place 2 sprays into the nose daily as needed (congestion).     . Vitamin D, Ergocalciferol, (DRISDOL) 50000 units CAPS capsule Take 1 capsule (50,000 Units total) by mouth every 7 (seven) days. (Patient taking differently: Take 50,000 Units by mouth every 7 (seven) days. Mondays) 12 capsule 0  . zolpidem (AMBIEN) 10 MG tablet Take 10 mg by mouth at bedtime as needed for sleep.     Marland Kitchen enoxaparin (LOVENOX) 30 MG/0.3ML injection Inject 0.65 mLs (65 mg total) into the skin every 12 (twelve) hours. 39 mL 11  . ibuprofen (ADVIL,MOTRIN) 200 MG tablet Take 800 mg by  mouth daily as needed for mild pain.     No current facility-administered medications for this visit.     PHYSICAL EXAMINATION: ECOG PERFORMANCE STATUS: 1 - Symptomatic but completely ambulatory  BP 101/69 (BP Location: Left Arm, Patient Position: Sitting)   Pulse (!) 105   Temp 97.9 F (36.6 C) (Oral)   Resp 18   Ht _0  (1.676 m)   Wt 144 lb (65.3 kg)   SpO2 98%   BMI 23.24 kg/m   Filed Weights   08/30/18 1014  Weight: 144 lb (65.3 kg)    Physical Exam  Constitutional: He is oriented to person, place, and time and well-developed, well-nourished, and in no distress.  He is accompanied by his wife.  Is walking himself.  He is on 2 L nasal cannula oxygen.  HENT:  Head: Normocephalic and atraumatic.  Mouth/Throat: Oropharynx is clear and moist. No oropharyngeal exudate.  Eyes: Pupils are equal, round, and reactive to light.  Neck: Normal range of motion. Neck supple.  Cardiovascular: Normal rate and regular rhythm.  Pulmonary/Chest: No respiratory distress. He has no wheezes.  Decreased breath sounds right side most of the bases.  Right chest wall approximately; approximately 4 x 8 cm;  2-3 cm "KNOTs" (progression)  Abdominal: Soft. Bowel sounds are normal. He exhibits no distension and no mass. There is no tenderness. There is no rebound and no guarding.  Musculoskeletal: Normal range of motion. He exhibits no edema or tenderness.  Neurological: He is alert and oriented to person, place, and time.  Skin: Skin is warm.  Psychiatric: Affect normal.       LABORATORY DATA:  I have reviewed the data as listed    Component Value Date/Time   NA 132 (L) 08/28/2018 1111   NA 141 10/05/2016 0848   K 4.0 08/28/2018 1111   K 4.3 05/15/2013 0612   CL 99 08/28/2018 1111   CO2 22 08/28/2018 1111   GLUCOSE 86 08/28/2018 1111   BUN 11 08/28/2018 1111   BUN 17 10/05/2016 0848   CREATININE 0.66 08/28/2018 1111   CALCIUM 8.7 (L) 08/28/2018 1111   PROT 5.7 (L) 08/28/2018 1111    ALBUMIN 2.7 (L) 08/28/2018 1111   AST 21 08/28/2018 1111   ALT 9 08/28/2018 1111   ALKPHOS 81 08/28/2018 1111   BILITOT 1.1 08/28/2018 1111   GFRNONAA >60 08/28/2018 1111   GFRAA >60 08/28/2018 1111  No results found for: SPEP, UPEP  Lab Results  Component Value Date   WBC 3.1 (L) 08/28/2018   NEUTROABS 1.9 08/28/2018   HGB 9.3 (L) 08/28/2018   HCT 28.9 (L) 08/28/2018   MCV 89.2 08/28/2018   PLT 191 08/28/2018      Chemistry      Component Value Date/Time   NA 132 (L) 08/28/2018 1111   NA 141 10/05/2016 0848   K 4.0 08/28/2018 1111   K 4.3 05/15/2013 0612   CL 99 08/28/2018 1111   CO2 22 08/28/2018 1111   BUN 11 08/28/2018 1111   BUN 17 10/05/2016 0848   CREATININE 0.66 08/28/2018 1111      Component Value Date/Time   CALCIUM 8.7 (L) 08/28/2018 1111   ALKPHOS 81 08/28/2018 1111   AST 21 08/28/2018 1111   ALT 9 08/28/2018 1111   BILITOT 1.1 08/28/2018 1111       RADIOGRAPHIC STUDIES: I have personally reviewed the radiological images as listed and agreed with the findings in the report. No results found.   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  Discussed that although CT scan is stable; the fact that patient continues to have significant bulky disease/with adequate response to the most aggressive therapy-unfortunately prognosis is poor.  #Continue with carbotaxol Avastin tomorrow; labs adequate.  Hold Tecentriq given radiation to the chest/lung.  Patient will finish radiation on December 10.  Discussed with Dr. Sondra Come.   # Chest wall pain second malignancy;  Morphine 6-8 mg qhs as needed; continue Percocet 5 /325 every 12 hours as needed/MS Contin.  Stable.  Please call for prescription.  # Epistaxis-question Avastin/oxygen.   Recommend nasal saline spray.  #Left lingular/segmental PE-current on Lovenox 1 mg/kg twice a day.  Recommend at least 3 months; and then transition to novel oral anticoagulation.  Patient will likely need indefinite anticoagulation.  #Okay with intermittent FMLA for work.  # DISPOSITION # Treatment tomorrow; carbo-taxol-avastin;UA; NO Tecentriq.  # follow up in 3 weeks/labs/MD;cbc/cmp/UA d-2- carbo-taxol-avastin-tecentriq.-Dr.B  # I reviewed the blood work- with the patient in detail; also reviewed the imaging independently [as summarized above]; and with the patient in detail.     Orders Placed This Encounter  Procedures  . Urinalysis, Complete w Microscopic    Standing Status:   Standing    Number of Occurrences:   20    Standing Expiration Date:   08/31/2019   All questions were answered. The patient knows to call the clinic with any problems, questions or concerns.      Cammie Sickle, MD 08/30/2018 5:20 PM

## 2018-08-31 ENCOUNTER — Inpatient Hospital Stay: Payer: No Typology Code available for payment source

## 2018-08-31 ENCOUNTER — Other Ambulatory Visit: Payer: Self-pay | Admitting: Internal Medicine

## 2018-08-31 ENCOUNTER — Ambulatory Visit
Admission: RE | Admit: 2018-08-31 | Discharge: 2018-08-31 | Disposition: A | Discharge status: Home / Self Care | Payer: No Typology Code available for payment source | Source: Ambulatory Visit | Attending: Radiation Oncology | Admitting: Radiation Oncology

## 2018-08-31 ENCOUNTER — Other Ambulatory Visit: Payer: Self-pay

## 2018-08-31 VITALS — BP 109/72 | HR 109 | Resp 20

## 2018-08-31 DIAGNOSIS — Z5111 Encounter for antineoplastic chemotherapy: Secondary | ICD-10-CM | POA: Diagnosis not present

## 2018-08-31 DIAGNOSIS — C3431 Malignant neoplasm of lower lobe, right bronchus or lung: Secondary | ICD-10-CM | POA: Diagnosis not present

## 2018-08-31 DIAGNOSIS — Z7189 Other specified counseling: Secondary | ICD-10-CM

## 2018-08-31 LAB — URINALYSIS, COMPLETE (UACMP) WITH MICROSCOPIC
Bacteria, UA: NONE SEEN
Bilirubin Urine: NEGATIVE
Glucose, UA: NEGATIVE mg/dL
Hgb urine dipstick: NEGATIVE
Ketones, ur: 5 mg/dL — AB
Leukocytes, UA: NEGATIVE
Nitrite: NEGATIVE
Protein, ur: NEGATIVE mg/dL
SPECIFIC GRAVITY, URINE: 1.02 (ref 1.005–1.030)
Squamous Epithelial / HPF: NONE SEEN (ref 0–5)
pH: 6 (ref 5.0–8.0)

## 2018-08-31 MED ORDER — MENTHOL 3 MG MT LOZG
1.0000 | LOZENGE | Freq: Once | OROMUCOSAL | Status: AC
  Administered 2018-08-31: 3 mg via ORAL
  Filled 2018-08-31: qty 9

## 2018-08-31 MED ORDER — FAMOTIDINE IN NACL 20-0.9 MG/50ML-% IV SOLN
20.0000 mg | Freq: Once | INTRAVENOUS | Status: AC
  Administered 2018-08-31: 20 mg via INTRAVENOUS
  Filled 2018-08-31: qty 50

## 2018-08-31 MED ORDER — SODIUM CHLORIDE 0.9 % IV SOLN
200.0000 mg/m2 | Freq: Once | INTRAVENOUS | Status: AC
  Administered 2018-08-31: 384 mg via INTRAVENOUS
  Filled 2018-08-31: qty 64

## 2018-08-31 MED ORDER — SODIUM CHLORIDE 0.9 % IV SOLN
20.0000 mg | Freq: Once | INTRAVENOUS | Status: AC
  Administered 2018-08-31: 20 mg via INTRAVENOUS
  Filled 2018-08-31: qty 2

## 2018-08-31 MED ORDER — SODIUM CHLORIDE 0.9 % IV SOLN
Freq: Once | INTRAVENOUS | Status: AC
  Administered 2018-08-31: 10:00:00 via INTRAVENOUS
  Filled 2018-08-31: qty 250

## 2018-08-31 MED ORDER — SODIUM CHLORIDE 0.9 % IV SOLN
780.0000 mg | Freq: Once | INTRAVENOUS | Status: AC
  Administered 2018-08-31: 780 mg via INTRAVENOUS
  Filled 2018-08-31: qty 78

## 2018-08-31 MED ORDER — SODIUM CHLORIDE 0.9 % IV SOLN
1000.0000 mg | INTRAVENOUS | Status: DC
  Administered 2018-08-31: 1000 mg via INTRAVENOUS
  Filled 2018-08-31: qty 32

## 2018-08-31 MED ORDER — DIPHENHYDRAMINE HCL 50 MG/ML IJ SOLN
50.0000 mg | Freq: Once | INTRAMUSCULAR | Status: AC
  Administered 2018-08-31: 50 mg via INTRAVENOUS
  Filled 2018-08-31: qty 1

## 2018-08-31 MED ORDER — PALONOSETRON HCL INJECTION 0.25 MG/5ML
0.2500 mg | Freq: Once | INTRAVENOUS | Status: AC
  Administered 2018-08-31: 0.25 mg via INTRAVENOUS
  Filled 2018-08-31: qty 5

## 2018-08-31 MED ORDER — HEPARIN SOD (PORK) LOCK FLUSH 100 UNIT/ML IV SOLN
500.0000 [IU] | Freq: Once | INTRAVENOUS | Status: AC | PRN
  Administered 2018-08-31: 500 [IU]
  Filled 2018-08-31: qty 5

## 2018-08-31 NOTE — Progress Notes (Signed)
Ok to proceed with treatment per MD Marchia Meiers, PharmD

## 2018-08-31 NOTE — Progress Notes (Signed)
Patient requesting Cepacol lonzenger x 1 while in clinic. Spoke with Dr. Rogue Bussing. V/o from MD "Ok to order."

## 2018-09-03 ENCOUNTER — Encounter: Payer: Self-pay | Admitting: Radiation Oncology

## 2018-09-03 ENCOUNTER — Ambulatory Visit
Admission: RE | Admit: 2018-09-03 | Discharge: 2018-09-03 | Disposition: A | Discharge status: Home / Self Care | Payer: No Typology Code available for payment source | Source: Ambulatory Visit | Attending: Radiation Oncology | Admitting: Radiation Oncology

## 2018-09-03 DIAGNOSIS — C3431 Malignant neoplasm of lower lobe, right bronchus or lung: Secondary | ICD-10-CM | POA: Diagnosis not present

## 2018-09-04 ENCOUNTER — Other Ambulatory Visit: Payer: Self-pay | Admitting: Radiation Oncology

## 2018-09-04 ENCOUNTER — Ambulatory Visit: Payer: No Typology Code available for payment source

## 2018-09-04 ENCOUNTER — Ambulatory Visit
Admission: RE | Admit: 2018-09-04 | Discharge: 2018-09-04 | Disposition: A | Discharge status: Home / Self Care | Payer: No Typology Code available for payment source | Source: Ambulatory Visit | Attending: Radiation Oncology | Admitting: Radiation Oncology

## 2018-09-04 DIAGNOSIS — C3431 Malignant neoplasm of lower lobe, right bronchus or lung: Secondary | ICD-10-CM | POA: Diagnosis not present

## 2018-09-04 MED ORDER — CEFDINIR 300 MG PO CAPS: 300.0000 mg | ORAL_CAPSULE | Freq: Two times a day (BID) | ORAL | 0 refills | Status: DC

## 2018-09-05 ENCOUNTER — Ambulatory Visit: Payer: No Typology Code available for payment source

## 2018-09-05 ENCOUNTER — Encounter: Payer: Self-pay | Admitting: Radiation Oncology

## 2018-09-05 NOTE — Progress Notes (Signed)
  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     At the beginning of treatment, pt reported feeling like there was "surface tension." Pt denied difficulty swallowing and worsening SOB throughout treatments. Towards the end of treatment, pt reported mild fatigue increase, chest pain (3-4/10), and cough which he believed to be related to a sinus infection and reported yellow/green sputum with some hemoptysis. Overall the pt was without complaints. Antibiotics given for sinus infection.  Plan: The patient has completed radiation treatment. The patient will return to radiation oncology clinic for routine followup in one month. I advised them to call or return sooner if they have any questions or concerns related to their recovery or treatment.  -----------------------------------  Blair Promise, PhD, MD  This document serves as a record of services personally performed by Gery Pray, MD. It was created on his behalf by Mary-Margaret Loma Messing, a trained medical scribe. The creation of this record is based on the scribe's personal observations and the provider's statements to them. This document has been checked and approved by the attending provider.

## 2018-09-11 ENCOUNTER — Other Ambulatory Visit: Payer: Self-pay | Admitting: Internal Medicine

## 2018-09-12 MED ORDER — LORAZEPAM 0.5 MG PO TABS: 0.5000 mg | ORAL_TABLET | Freq: Three times a day (TID) | ORAL | 0 refills | Status: DC | PRN

## 2018-09-20 ENCOUNTER — Other Ambulatory Visit: Payer: Self-pay

## 2018-09-20 ENCOUNTER — Inpatient Hospital Stay: Payer: No Typology Code available for payment source

## 2018-09-20 ENCOUNTER — Inpatient Hospital Stay (HOSPITAL_BASED_OUTPATIENT_CLINIC_OR_DEPARTMENT_OTHER): Payer: No Typology Code available for payment source | Admitting: Internal Medicine

## 2018-09-20 VITALS — BP 103/69 | HR 115 | Temp 97.6°F | Resp 18 | Ht 66.0 in | Wt 130.0 lb

## 2018-09-20 DIAGNOSIS — Z5111 Encounter for antineoplastic chemotherapy: Secondary | ICD-10-CM | POA: Diagnosis not present

## 2018-09-20 DIAGNOSIS — R5383 Other fatigue: Secondary | ICD-10-CM | POA: Diagnosis not present

## 2018-09-20 DIAGNOSIS — Z9989 Dependence on other enabling machines and devices: Secondary | ICD-10-CM

## 2018-09-20 DIAGNOSIS — G4733 Obstructive sleep apnea (adult) (pediatric): Secondary | ICD-10-CM

## 2018-09-20 DIAGNOSIS — Z79899 Other long term (current) drug therapy: Secondary | ICD-10-CM

## 2018-09-20 DIAGNOSIS — Z791 Long term (current) use of non-steroidal anti-inflammatories (NSAID): Secondary | ICD-10-CM

## 2018-09-20 DIAGNOSIS — Z923 Personal history of irradiation: Secondary | ICD-10-CM | POA: Diagnosis not present

## 2018-09-20 DIAGNOSIS — Z86711 Personal history of pulmonary embolism: Secondary | ICD-10-CM

## 2018-09-20 DIAGNOSIS — Z7189 Other specified counseling: Secondary | ICD-10-CM

## 2018-09-20 DIAGNOSIS — R222 Localized swelling, mass and lump, trunk: Secondary | ICD-10-CM

## 2018-09-20 DIAGNOSIS — C3431 Malignant neoplasm of lower lobe, right bronchus or lung: Secondary | ICD-10-CM | POA: Diagnosis not present

## 2018-09-20 DIAGNOSIS — J90 Pleural effusion, not elsewhere classified: Secondary | ICD-10-CM

## 2018-09-20 DIAGNOSIS — D649 Anemia, unspecified: Secondary | ICD-10-CM | POA: Diagnosis not present

## 2018-09-20 DIAGNOSIS — Z7901 Long term (current) use of anticoagulants: Secondary | ICD-10-CM

## 2018-09-20 DIAGNOSIS — Z8673 Personal history of transient ischemic attack (TIA), and cerebral infarction without residual deficits: Secondary | ICD-10-CM

## 2018-09-20 DIAGNOSIS — R5381 Other malaise: Secondary | ICD-10-CM

## 2018-09-20 DIAGNOSIS — E46 Unspecified protein-calorie malnutrition: Secondary | ICD-10-CM

## 2018-09-20 DIAGNOSIS — Z87891 Personal history of nicotine dependence: Secondary | ICD-10-CM

## 2018-09-20 DIAGNOSIS — Z9981 Dependence on supplemental oxygen: Secondary | ICD-10-CM

## 2018-09-20 DIAGNOSIS — I10 Essential (primary) hypertension: Secondary | ICD-10-CM

## 2018-09-20 LAB — COMPREHENSIVE METABOLIC PANEL
ALT: 13 U/L (ref 0–44)
ANION GAP: 9 (ref 5–15)
AST: 18 U/L (ref 15–41)
Albumin: 2.8 g/dL — ABNORMAL LOW (ref 3.5–5.0)
Alkaline Phosphatase: 82 U/L (ref 38–126)
BILIRUBIN TOTAL: 0.5 mg/dL (ref 0.3–1.2)
BUN: 18 mg/dL (ref 6–20)
CO2: 24 mmol/L (ref 22–32)
Calcium: 8.8 mg/dL — ABNORMAL LOW (ref 8.9–10.3)
Chloride: 99 mmol/L (ref 98–111)
Creatinine, Ser: 0.63 mg/dL (ref 0.61–1.24)
GFR calc Af Amer: >60 mL/min (ref >60)
GFR calc non Af Amer: >60 mL/min (ref >60)
Glucose, Bld: 132 mg/dL — ABNORMAL HIGH (ref 70–99)
Potassium: 4.1 mmol/L (ref 3.5–5.1)
Sodium: 132 mmol/L — ABNORMAL LOW (ref 135–145)
TOTAL PROTEIN: 6.5 g/dL (ref 6.5–8.1)

## 2018-09-20 LAB — URINALYSIS, COMPLETE (UACMP) WITH MICROSCOPIC
Bacteria, UA: NONE SEEN
Bilirubin Urine: NEGATIVE
GLUCOSE, UA: NEGATIVE mg/dL
HGB URINE DIPSTICK: NEGATIVE
Ketones, ur: NEGATIVE mg/dL
Leukocytes, UA: NEGATIVE
Nitrite: NEGATIVE
Protein, ur: NEGATIVE mg/dL
Specific Gravity, Urine: 1.023 (ref 1.005–1.030)
pH: 6 (ref 5.0–8.0)

## 2018-09-20 LAB — CBC WITH DIFFERENTIAL/PLATELET
Abs Immature Granulocytes: 0.01 10*3/uL (ref 0.00–0.07)
Basophils Absolute: 0 10*3/uL (ref 0.0–0.1)
Basophils Relative: 1 %
Eosinophils Absolute: 0 10*3/uL (ref 0.0–0.5)
Eosinophils Relative: 1 %
HCT: 27.6 % — ABNORMAL LOW (ref 39.0–52.0)
Hemoglobin: 8.9 g/dL — ABNORMAL LOW (ref 13.0–17.0)
Immature Granulocytes: 0 %
Lymphocytes Relative: 20 %
Lymphs Abs: 0.5 10*3/uL — ABNORMAL LOW (ref 0.7–4.0)
MCH: 29.5 pg (ref 26.0–34.0)
MCHC: 32.2 g/dL (ref 30.0–36.0)
MCV: 91.4 fL (ref 80.0–100.0)
MONO ABS: 0.5 10*3/uL (ref 0.1–1.0)
Monocytes Relative: 19 %
Neutro Abs: 1.4 10*3/uL — ABNORMAL LOW (ref 1.7–7.7)
Neutrophils Relative %: 59 %
Platelets: 212 10*3/uL (ref 150–400)
RBC: 3.02 MIL/uL — ABNORMAL LOW (ref 4.22–5.81)
RDW: 21.2 % — ABNORMAL HIGH (ref 11.5–15.5)
WBC: 2.3 10*3/uL — ABNORMAL LOW (ref 4.0–10.5)
nRBC: 0 % (ref 0.0–0.2)

## 2018-09-20 MED ORDER — RIVAROXABAN 20 MG PO TABS: 20.0000 mg | ORAL_TABLET | Freq: Every day | ORAL | 6 refills | Status: DC

## 2018-09-20 NOTE — Assessment & Plan Note (Addendum)
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  Discussed that although CT scan is stable; the fact that patient continues to have significant bulky disease/with adequate response to the most aggressive therapy-unfortunately prognosis is poor.  #Continue with carbotaxol Avastin-Tecentriq # 4 tomorrow; labs adequate.  CT scan in 3 weeks.  Discussed regarding maintenance Avastin Tecentriq based upon the CT scan results in 3 weeks.  #Anemia moderate 8.9 likely from chemotherapy and lung malignancy no evidence of any active bleeding.  Check labs in 10 days possible PRBC transfusion if needed.  Discussed with the patient he agrees.  # Chest wall pain second malignancy; s/p RT [finished dec 10th] Morphine 6-8 mg qhs as needed; continue Percocet 5 /325 every 12 hours as needed/MS Contin.  Stable.  #Left lingular/segmental PE-current on Lovenox 1 mg/kg twice a day. Discussed re: switching to NOAKs; plan to start xarelto '20mg'$ /day.   #Malnutrition Albumin 2.9.  Recommend increase p.o. intake.  Urine analysis negative for protein.  # DISPOSITION # Treatment tomorrow; carbo-taxol-avastin;Tecentriq.  # 10 days- labs- cbc/hold tube/possible 1unit PRBC transfusion.  # follow up in 3 weeks/labs/MD;cbc/cmp/UA  avastin-tecentriq.CT chest prior-Dr.B

## 2018-09-20 NOTE — Progress Notes (Signed)
[place] OFFICE PROGRESS NOTE  Patient Care Team: Dion Body, MD as PCP - General (Family Medicine) [name], MD as PCP - Cardiology (Cardiology) Telford Nab, RN as Registered Nurse  Cancer Staging Primary cancer of right lower lobe of lung [employer]) Staging form: Lung, AJCC 8th Edition - Clinical stage from 03/02/2018: Stage IV (cT4, cN0, pM1a) - Unsigned    Oncology History   # June 2019- LUNG CA- HIGH GRADE CA with sarcomatoid features [EGFR MUTATED; exon 19; clinically non-small cell];Dr.Jeffrey Clark; II opinion at [facility];  # June 17th- carbo-taxol #1; PROGRESSION June 26th 2019- Osimertinib '80mg'$ /day; S/p palliative RT [facility]; June 28th, 2019]; reirradiation right chest wall finished radiation December 10th [Dr.Kinard]  # Oct 2019- Severe progression in chest; START carbo-Taxol-Tecentriq-Avastin  # Repeat chest Bx- Nov 2019- high grade Non-small cell with sarcomatoid features  # Dec 3rd 2019- Left Lung PE- lovenox  # HTN/ cryptogenic stroke;  # MOLECULAR TESTING- EGFR exon 19 deletion [omniseq]; Oct 2019- Plasma/guadant tetsing- EGFR exon 19del*   DIAGNOSIS: non-small cell lung ca  STAGE:  IV  ;GOALS: palliative  CURRENT/MOST RECENT THERAPY- Carbo-Taxol-Avastin-Tecentriq      Primary cancer of right lower lobe of lung (HCC)      INTERVAL HISTORY:  Ruben Eaton 47 y.o.  male pleasant patient above history of metastatic adenocarcinoma the lung/EGFR mutated is currently carbotaxol Tecentriq plus Avastin is here for follow-up status post 3 cycles approximately 3 weeks ago is here for follow-up.  In the interim patient had finished radiation palliative chest wall on December 10.  Patient appetite is fair.  Mild weight loss.  Continues to have chronic mild shortness of breath for which he is using oxygen as needed at nighttime.  Lovenox twice a day for his PE.  He continues to use liquid morphine Percocet and also using MS Contin.   Stable.  He states right chest wall mass is stable.  Not any worse.  Complains of stinging pain from the Lovenox injection.   Review of Systems  Constitutional: Positive for malaise/fatigue. Negative for chills, diaphoresis, fever and weight loss.  HENT: Negative for nosebleeds and sore throat.   Eyes: Negative for double vision.  Respiratory: Positive for cough and shortness of breath. Negative for hemoptysis, sputum production and wheezing.   Cardiovascular: Positive for chest pain (Right chest wall pain.). Negative for palpitations, orthopnea and leg swelling.  Gastrointestinal: Negative for blood in stool, constipation, heartburn, melena, nausea and vomiting.  Genitourinary: Negative for dysuria, frequency and urgency.  Musculoskeletal: Negative for back pain and joint pain.  Skin: Negative.  Negative for itching and rash.  Neurological: Negative for dizziness, tingling, focal weakness, weakness and headaches.  Endo/Heme/Allergies: Does not bruise/bleed easily.  Psychiatric/Behavioral: Negative for depression. The patient is not nervous/anxious and does not have insomnia.       PAST MEDICAL HISTORY :  Past Medical History:  Diagnosis Date  . Cryptogenic stroke ([place]) 08/2016   a. 09/15/2016 in setting of PFO.  . CVA (cerebral vascular accident) ([place]) 09/07/2016  . ED (erectile dysfunction)   . Essential (primary) hypertension 02/01/2016  . History of stroke 10/08/2016  . Hx of insomnia   . Hypertension   . OSA on CPAP   . PFO (patent foramen ovale)    a. s/p closure 09/2016 by Dr. [name].  . Pleural effusion on right 02/21/2018  . Primary cancer of right lower lobe of lung ([place]) 02/25/2018   Chemo tx's and rad tx's  .  PVC's (premature ventricular contractions) 10/08/2016  . 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 TIA (transient ischemic attack) 09/07/2016    PAST SURGICAL HISTORY :   Past Surgical History:  Procedure Laterality Date  . APPENDECTOMY  1980  .  EYE SURGERY    . PATENT FORAMEN OVALE CLOSURE  10/07/2016  . PATENT FORAMEN OVALE CLOSURE N/A 10/07/2016   Procedure: Patent [place](PFO) Closure;  Surgeon: [name], MD;  Location: [place] [facility];  Service: Cardiovascular;  Laterality: N/A;  . PORTACATH PLACEMENT N/A 02/23/2018   Procedure: INSERTION PORT-A-CATH;  Surgeon: Nestor Lewandowsky, MD;  Location: [facility] ORS;  Service: Thoracic;  Laterality: N/A;  . STRABISMUS SURGERY Left 1984  . TEE WITHOUT CARDIOVERSION N/A 09/08/2016   Procedure: TRANSESOPHAGEAL ECHOCARDIOGRAM (TEE);  Surgeon: [name], MD;  Location: [facility] ORS;  Service: Cardiovascular;  Laterality: N/A;  . VIDEO ASSISTED THORACOSCOPY (VATS) W/TALC PLEUADESIS Right 02/23/2018   Procedure: VIDEO ASSISTED THORACOSCOPY (VATS) W/TALC PLEUADESIS;  Surgeon: Nestor Lewandowsky, MD;  Location: [facility] ORS;  Service: Thoracic;  Laterality: Right;  . VITRECTOMY Right 2013    FAMILY HISTORY :   Family History  Problem Relation Age of Onset  . Other Paternal Uncle        Abdominal tumor     SOCIAL HISTORY:   Social History   Tobacco Use  . Smoking status: Former Smoker    Packs/day: 0.50    Years: 10.00    Pack years: 5.00    Last attempt to quit: 2011    Years since quitting: 8.9  . Smokeless tobacco: Former Systems developer    Types: Snuff  Substance Use Topics  . Alcohol use: Yes    Alcohol/week: 3.0 standard drinks    Types: 3 Cans of beer per week  . Drug use: No    ALLERGIES:  has No Known Allergies.  MEDICATIONS:  Current Outpatient Medications  Medication Sig Dispense Refill  . acetaminophen (TYLENOL) 325 MG tablet Take 2 tablets (650 mg total) by mouth every 6 (six) hours as needed for mild pain (or Fever >/= 101).    . B Complex-C (B-COMPLEX WITH VITAMIN C) tablet Take 1 tablet by mouth daily.    Marland Kitchen dicyclomine (BENTYL) 10 MG capsule Take 1 capsule (10 mg total) by mouth 4 (four) times daily -  before meals and at bedtime. 40 capsule 3  . enoxaparin (LOVENOX) 30  MG/0.3ML injection Inject 0.65 mLs (65 mg total) into the skin 2 (two) times daily. 117 mL 3  . furosemide (LASIX) 20 MG tablet Take 1 tablet (20 mg total) by mouth daily as needed for edema. 30 tablet 6  . ibuprofen (ADVIL,MOTRIN) 200 MG tablet Take 800 mg by mouth daily as needed for mild pain.    Marland Kitchen lidocaine-prilocaine (EMLA) cream Apply 1 application topically as needed. Apply generously over the Mediport 45 minutes prior to chemotherapy. 30 g 0  . loratadine (CLARITIN) 10 MG tablet Take 10 mg by mouth daily as needed for allergies.     Marland Kitchen LORazepam (ATIVAN) 0.5 MG tablet Take 1 tablet (0.5 mg total) by mouth every 8 (eight) hours as needed for anxiety. 45 tablet 0  . metoprolol tartrate (LOPRESSOR) 25 MG tablet Take 1 tablet (25 mg total) by mouth 2 (two) times daily. 60 tablet 2  . morphine (MS CONTIN) 15 MG 12 hr tablet Take 1 tablet (15 mg total) by mouth as needed for pain. 60 tablet 0  . Morphine Sulfate (MORPHINE CONCENTRATE)  10 mg / 0.5 ml concentrated solution Take 0.5 mLs (10 mg total) by mouth every 6 (six) hours as needed for severe pain. 30 mL 0  . Multiple Vitamin (MULTIVITAMIN WITH MINERALS) TABS tablet Take 1 tablet by mouth daily.    Marland Kitchen oxyCODONE-acetaminophen (PERCOCET) 5-325 MG tablet Take 1 tablet by mouth every 12 (twelve) hours as needed for severe pain. 60 tablet 0  . prochlorperazine (COMPAZINE) 10 MG tablet Take 1 tablet (10 mg total) by mouth every 6 (six) hours as needed (Nausea or vomiting). 30 tablet 1  . tadalafil (CIALIS) 5 MG tablet Take 5 mg by mouth daily as needed for erectile dysfunction.    . triamcinolone (NASACORT) 55 MCG/ACT AERO nasal inhaler Place 2 sprays into the nose daily as needed (congestion).     . Vitamin D, Ergocalciferol, (DRISDOL) 50000 units CAPS capsule Take 1 capsule (50,000 Units total) by mouth every 7 (seven) days. (Patient taking differently: Take 50,000 Units by mouth every 7 (seven) days. Mondays) 12 capsule 0  . zolpidem (AMBIEN) 10 MG  tablet Take 10 mg by mouth at bedtime as needed for sleep.     . rivaroxaban (XARELTO) 20 MG TABS tablet Take 1 tablet (20 mg total) by mouth daily with supper. 30 tablet 6   No current facility-administered medications for this visit.     PHYSICAL EXAMINATION: ECOG PERFORMANCE STATUS: 1 - Symptomatic but completely ambulatory  BP 103/69 (BP Location: Right Arm, Patient Position: Sitting)   Pulse (!) 115   Temp 97.6 F (36.4 C) (Tympanic)   Resp 18   Ht '5\' 6"'$  (1.676 m)   Wt 130 lb (59 kg)   BMI 20.98 kg/m   Filed Weights   09/20/18 0844  Weight: 130 lb (59 kg)    Physical Exam  Constitutional: He is oriented to person, place, and time and well-developed, well-nourished, and in no distress.  He is alone.   Is walking himself.  He is off oxygen.   HENT:  Head: Normocephalic and atraumatic.  Mouth/Throat: Oropharynx is clear and moist. No oropharyngeal exudate.  Eyes: Pupils are equal, round, and reactive to light.  Neck: Normal range of motion. Neck supple.  Cardiovascular: Normal rate and regular rhythm.  Pulmonary/Chest: No respiratory distress. He has no wheezes.  Decreased breath sounds right side most of the bases.  Right chest wall approximately; approximately 4 x 8 cm;  2-3 cm "KNOTs" (progression)  Abdominal: Soft. Bowel sounds are normal. He exhibits no distension and no mass. There is no abdominal tenderness. There is no rebound and no guarding.  Musculoskeletal: Normal range of motion.        General: No tenderness or edema.  Neurological: He is alert and oriented to person, place, and time.  Skin: Skin is warm.  Psychiatric: Affect normal.       LABORATORY DATA:  I have reviewed the data as listed    Component Value Date/Time   NA 132 (L) 09/20/2018 0806   NA 141 10/05/2016 0848   K 4.1 09/20/2018 0806   K 4.3 05/15/2013 0612   CL 99 09/20/2018 0806   CO2 24 09/20/2018 0806   GLUCOSE 132 (H) 09/20/2018 0806   BUN 18 09/20/2018 0806   BUN 17  10/05/2016 0848   CREATININE 0.63 09/20/2018 0806   CALCIUM 8.8 (L) 09/20/2018 0806   PROT 6.5 09/20/2018 0806   ALBUMIN 2.8 (L) 09/20/2018 0806   AST 18 09/20/2018 0806   ALT 13 09/20/2018 0806  ALKPHOS 82 09/20/2018 0806   BILITOT 0.5 09/20/2018 0806   GFRNONAA >60 09/20/2018 0806   GFRAA >60 09/20/2018 0806    No results found for: SPEP, UPEP  Lab Results  Component Value Date   WBC 2.3 (L) 09/20/2018   NEUTROABS 1.4 (L) 09/20/2018   HGB 8.9 (L) 09/20/2018   HCT 27.6 (L) 09/20/2018   MCV 91.4 09/20/2018   PLT 212 09/20/2018      Chemistry      Component Value Date/Time   NA 132 (L) 09/20/2018 0806   NA 141 10/05/2016 0848   K 4.1 09/20/2018 0806   K 4.3 05/15/2013 0612   CL 99 09/20/2018 0806   CO2 24 09/20/2018 0806   BUN 18 09/20/2018 0806   BUN 17 10/05/2016 0848   CREATININE 0.63 09/20/2018 0806      Component Value Date/Time   CALCIUM 8.8 (L) 09/20/2018 0806   ALKPHOS 82 09/20/2018 0806   AST 18 09/20/2018 0806   ALT 13 09/20/2018 0806   BILITOT 0.5 09/20/2018 0806       RADIOGRAPHIC STUDIES: I have personally reviewed the radiological images as listed and agreed with the findings in the report. No results found.   ASSESSMENT & PLAN:  Primary cancer of right lower lobe of lung ([place]) #Non-small cell lung cancer/stage IV-EGFR mutated exon 19- Currently on carbotaxol Avastin Tecentriq-2 cycles; CT scan August 28, 2018-stable disease; left lingular/segmental PE [see discussion below].  Discussed that although CT scan is stable; the fact that patient continues to have significant bulky disease/with adequate response to the most aggressive therapy-unfortunately prognosis is poor.  #Continue with carbotaxol Avastin-Tecentriq # 4 tomorrow; labs adequate.  CT scan in 3 weeks.  Discussed regarding maintenance Avastin Tecentriq based upon the CT scan results in 3 weeks.  #Anemia moderate 8.9 likely from chemotherapy and lung malignancy no evidence of any  active bleeding.  Check labs in 10 days possible PRBC transfusion if needed.  Discussed with the patient he agrees.  # Chest wall pain second malignancy; s/p RT [finished dec 10th] Morphine 6-8 mg qhs as needed; continue Percocet 5 /325 every 12 hours as needed/MS Contin.  Stable.  #Left lingular/segmental PE-current on Lovenox 1 mg/kg twice a day. Discussed re: switching to NOAKs; plan to start xarelto '20mg'$ /day.   #Malnutrition Albumin 2.9.  Recommend increase p.o. intake.  Urine analysis negative for protein.  # DISPOSITION # Treatment tomorrow; carbo-taxol-avastin;Tecentriq.  # 10 days- labs- cbc/hold tube/possible 1unit PRBC transfusion.  # follow up in 3 weeks/labs/MD;cbc/cmp/UA  avastin-tecentriq.CT chest prior-Dr.B     Orders Placed This Encounter  Procedures  . CT CHEST W CONTRAST    Standing Status:   Future    Standing Expiration Date:   09/21/2019    Order Specific Question:   If indicated for the ordered procedure, I authorize the administration of contrast media per Radiology protocol    Answer:   Yes    Order Specific Question:   Preferred imaging location?    Answer:   [facility]    Order Specific Question:   Radiology Contrast Protocol - do NOT remove file path    Answer:   [email or web address]    Order Specific Question:   ** REASON FOR EXAM (FREE TEXT)    Answer:   lung cancer   All questions were answered. The patient knows to call the clinic with any problems, questions or concerns.      Cammie Sickle, MD 09/20/2018 10:09 AM

## 2018-09-21 ENCOUNTER — Inpatient Hospital Stay: Payer: No Typology Code available for payment source

## 2018-09-21 ENCOUNTER — Other Ambulatory Visit: Payer: Self-pay | Admitting: Nurse Practitioner

## 2018-09-21 VITALS — BP 103/70 | HR 109 | Temp 96.7°F | Resp 18

## 2018-09-21 DIAGNOSIS — C3431 Malignant neoplasm of lower lobe, right bronchus or lung: Secondary | ICD-10-CM

## 2018-09-21 DIAGNOSIS — Z5111 Encounter for antineoplastic chemotherapy: Secondary | ICD-10-CM | POA: Diagnosis not present

## 2018-09-21 DIAGNOSIS — Z7189 Other specified counseling: Secondary | ICD-10-CM

## 2018-09-21 MED ORDER — DRONABINOL 5 MG PO CAPS: 5.0000 mg | ORAL_CAPSULE | Freq: Two times a day (BID) | ORAL | 0 refills | Status: AC

## 2018-09-21 MED ORDER — SODIUM CHLORIDE 0.9 % IV SOLN
1200.0000 mg | Freq: Once | INTRAVENOUS | Status: AC
  Administered 2018-09-21: 1200 mg via INTRAVENOUS
  Filled 2018-09-21: qty 20

## 2018-09-21 MED ORDER — SODIUM CHLORIDE 0.9 % IV SOLN
Freq: Once | INTRAVENOUS | Status: AC
  Administered 2018-09-21: 09:00:00 via INTRAVENOUS
  Filled 2018-09-21: qty 250

## 2018-09-21 MED ORDER — SODIUM CHLORIDE 0.9 % IV SOLN
900.0000 mg | INTRAVENOUS | Status: DC
  Administered 2018-09-21: 900 mg via INTRAVENOUS
  Filled 2018-09-21: qty 36

## 2018-09-21 MED ORDER — FAMOTIDINE IN NACL 20-0.9 MG/50ML-% IV SOLN
20.0000 mg | Freq: Once | INTRAVENOUS | Status: AC
  Administered 2018-09-21: 20 mg via INTRAVENOUS
  Filled 2018-09-21: qty 50

## 2018-09-21 MED ORDER — DIPHENHYDRAMINE HCL 50 MG/ML IJ SOLN
50.0000 mg | Freq: Once | INTRAMUSCULAR | Status: AC
  Administered 2018-09-21: 50 mg via INTRAVENOUS
  Filled 2018-09-21: qty 1

## 2018-09-21 MED ORDER — SODIUM CHLORIDE 0.9 % IV SOLN
20.0000 mg | Freq: Once | INTRAVENOUS | Status: AC
  Administered 2018-09-21: 20 mg via INTRAVENOUS
  Filled 2018-09-21: qty 2

## 2018-09-21 MED ORDER — SODIUM CHLORIDE 0.9 % IV SOLN
780.0000 mg | Freq: Once | INTRAVENOUS | Status: AC
  Administered 2018-09-21: 780 mg via INTRAVENOUS
  Filled 2018-09-21: qty 78

## 2018-09-21 MED ORDER — SODIUM CHLORIDE 0.9% FLUSH
10.0000 mL | INTRAVENOUS | Status: DC | PRN
  Administered 2018-09-21: 10 mL
  Filled 2018-09-21: qty 10

## 2018-09-21 MED ORDER — HEPARIN SOD (PORK) LOCK FLUSH 100 UNIT/ML IV SOLN
500.0000 [IU] | Freq: Once | INTRAVENOUS | Status: AC | PRN
  Administered 2018-09-21: 500 [IU]
  Filled 2018-09-21: qty 5

## 2018-09-21 MED ORDER — SODIUM CHLORIDE 0.9 % IV SOLN
332.0000 mg | Freq: Once | INTRAVENOUS | Status: AC
  Administered 2018-09-21: 330 mg via INTRAVENOUS
  Filled 2018-09-21: qty 55

## 2018-09-21 MED ORDER — PALONOSETRON HCL INJECTION 0.25 MG/5ML
0.2500 mg | Freq: Once | INTRAVENOUS | Status: AC
  Administered 2018-09-21: 0.25 mg via INTRAVENOUS
  Filled 2018-09-21: qty 5

## 2018-09-21 NOTE — Progress Notes (Addendum)
Carbo dose reduced due to weight loss. Heart rate elevated today; will add 1L NaCl to treatment today. Patient to monitor at home. TSH order placed in setting of tecentriq. Discussed weight loss with patient including appetite stimulants. He requests marinol trial which I advised is not well supported in the literature but we can do a trial. He requests short courses of steroids in between treatments which I advised may interfere with immunotherapy. Discussed megace but given history of PE I would also be reluctant to start without discussing more with Dr. Rogue Bussing. Patient agreed and will discuss further with Dr. Rogue Bussing at next appt.

## 2018-09-21 NOTE — Progress Notes (Signed)
NP Lauren ok with dose reduction and giving patient therapy with HR of 115. NP to place orders for nursing to give fluids to reduce HR along with TSH for Tecentriq.  Larene Beach, PharmD

## 2018-09-24 ENCOUNTER — Encounter: Payer: Self-pay | Admitting: *Deleted

## 2018-09-24 ENCOUNTER — Encounter: Payer: Self-pay | Admitting: Radiation Oncology

## 2018-09-24 ENCOUNTER — Telehealth: Payer: Self-pay | Admitting: *Deleted

## 2018-09-24 NOTE — Progress Notes (Signed)
  Radiation Oncology         [phone] ________________________________  Name: Ruben Eaton MRN: [id number]  Date: 09/24/2018  DOB: [birth year]  End of Treatment Note  Diagnosis:   High grade non-small cell carcinoma with sarcomatoid features.  Cancer Staging Primary cancer of right lower lobe of lung ([place]) Staging form: Lung, AJCC 8th Edition - Clinical stage from 03/02/2018: Stage IV (cT4, cN0, pM1a) - Unsigned      Indication for treatment:  Palliative,tumor progression on systemic therapy, limited options     Radiation treatment dates:   08/20/2018-09/04/2018  Site/dose:   Right lung, 3 Gy in 10 fractions for a total dose of 30 Gy  Beams/energy:   3D, 15X  Narrative: The patient tolerated radiation treatment relatively well. At the beginning of his treatment, he noted SOB that was typical for him. He denies fatigue, pain, cough, or difficulty swallowing. On PE, it was noted that on lung exam, there was markedly decreased breath sounds along the right lung field. Towards the end of his treatment, he noted moderate fatigue, pain in chest (3-4/10), cough related to a sinus infection with yello/green sputum, mild hemoptysis. He denied difficulty swallowing or worsening SOB. Antibiotics given for sinus infection.  Plan: The patient has completed radiation treatment. The patient will return to radiation oncology clinic for routine followup in one month. I advised them to call or return sooner if they have any questions or concerns related to their recovery or treatment.  -----------------------------------  Blair Promise, PhD, MD  This document serves as a record of services personally performed by Gery Pray, MD. It was created on his behalf by [facility], a trained medical scribe. The creation of this record is based on the scribe's personal observations and the provider's statements to them. This document has been checked and approved by the attending provider.

## 2018-09-24 NOTE — Telephone Encounter (Signed)
[name]: [facility] - PA Case ID: [id number]  PA for dronabinol initiated. Pending insurance approval.

## 2018-09-25 ENCOUNTER — Encounter: Payer: Self-pay | Admitting: Internal Medicine

## 2018-09-27 ENCOUNTER — Other Ambulatory Visit: Payer: Self-pay | Admitting: Oncology

## 2018-09-27 ENCOUNTER — Encounter: Payer: Self-pay | Admitting: Internal Medicine

## 2018-09-28 ENCOUNTER — Telehealth: Payer: Self-pay | Admitting: *Deleted

## 2018-09-28 ENCOUNTER — Other Ambulatory Visit: Payer: Self-pay | Admitting: *Deleted

## 2018-09-28 DIAGNOSIS — C3431 Malignant neoplasm of lower lobe, right bronchus or lung: Secondary | ICD-10-CM

## 2018-09-28 MED ORDER — RIVAROXABAN 20 MG PO TABS: 20.0000 mg | ORAL_TABLET | Freq: Every day | ORAL | 3 refills | Status: AC

## 2018-09-28 MED ORDER — OXYCODONE-ACETAMINOPHEN 5-325 MG PO TABS: 1.0000 | ORAL_TABLET | Freq: Two times a day (BID) | ORAL | 0 refills | Status: DC | PRN

## 2018-09-28 NOTE — Telephone Encounter (Signed)
Narcotic refill request: 09/28/18 Percocet  As mandated by the [place] STOP Act (Strengthen Opioid Misuse Prevention), the [place] reviewed prior to consideration of refills as below:       Given a current oncology diagnosis, this patient has the potential to experience significant cancer related pain. Benefits versus risks associated with continued therapy considered. Will continue pain management with opioids as previously prescribed.   Patient educated that medications should not be bitten, chewed, or crushed. Additionally, safety precautions reviewed. Patient verbalized understanding that medications should not be sold or shared, taken with alcohol, or used while driving. He has been made aware of the side effects of using this medication. Patient understands that this medication can cause CNS depression, increase his risk of falls, and even lead to overdose that may result in death, if used outside of the parameters that he and I discussed.   With all of this in mind, he accepts the risks and responsibilities associated with therapy and elects to continue to use the prescribed interventions. As supervising physician, Dr. Rogue Bussing, agrees that continuation of opioid therapy is medically appropriate at this time and agrees to provide continual monitoring, including urine/blood drug screens, as indicated.   Refill prescription sent electronically using [employer] secure transmission to requested pharmacy:  1. [place], DNP, AGNP-C [place] at [facility] [phone] (work cell) [phone] (office)

## 2018-09-28 NOTE — Telephone Encounter (Signed)
Due to insurance, Patient requires 90 days supply of xarelto to be sent to [facility].

## 2018-10-01 ENCOUNTER — Inpatient Hospital Stay: Payer: 59 | Attending: Internal Medicine

## 2018-10-01 ENCOUNTER — Inpatient Hospital Stay: Payer: 59

## 2018-10-01 ENCOUNTER — Other Ambulatory Visit: Payer: Self-pay | Admitting: *Deleted

## 2018-10-01 DIAGNOSIS — T451X5S Adverse effect of antineoplastic and immunosuppressive drugs, sequela: Secondary | ICD-10-CM | POA: Diagnosis not present

## 2018-10-01 DIAGNOSIS — G629 Polyneuropathy, unspecified: Secondary | ICD-10-CM | POA: Insufficient documentation

## 2018-10-01 DIAGNOSIS — Z79899 Other long term (current) drug therapy: Secondary | ICD-10-CM | POA: Insufficient documentation

## 2018-10-01 DIAGNOSIS — Z9989 Dependence on other enabling machines and devices: Secondary | ICD-10-CM | POA: Insufficient documentation

## 2018-10-01 DIAGNOSIS — D6481 Anemia due to antineoplastic chemotherapy: Secondary | ICD-10-CM | POA: Diagnosis not present

## 2018-10-01 DIAGNOSIS — D649 Anemia, unspecified: Secondary | ICD-10-CM

## 2018-10-01 DIAGNOSIS — Z8673 Personal history of transient ischemic attack (TIA), and cerebral infarction without residual deficits: Secondary | ICD-10-CM | POA: Diagnosis not present

## 2018-10-01 DIAGNOSIS — Z5112 Encounter for antineoplastic immunotherapy: Secondary | ICD-10-CM | POA: Diagnosis not present

## 2018-10-01 DIAGNOSIS — G4733 Obstructive sleep apnea (adult) (pediatric): Secondary | ICD-10-CM | POA: Insufficient documentation

## 2018-10-01 DIAGNOSIS — G620 Drug-induced polyneuropathy: Secondary | ICD-10-CM | POA: Diagnosis not present

## 2018-10-01 DIAGNOSIS — C3431 Malignant neoplasm of lower lobe, right bronchus or lung: Secondary | ICD-10-CM | POA: Insufficient documentation

## 2018-10-01 DIAGNOSIS — R05 Cough: Secondary | ICD-10-CM | POA: Diagnosis not present

## 2018-10-01 DIAGNOSIS — Z923 Personal history of irradiation: Secondary | ICD-10-CM | POA: Diagnosis not present

## 2018-10-01 DIAGNOSIS — Z87891 Personal history of nicotine dependence: Secondary | ICD-10-CM | POA: Diagnosis not present

## 2018-10-01 DIAGNOSIS — I10 Essential (primary) hypertension: Secondary | ICD-10-CM | POA: Diagnosis not present

## 2018-10-01 DIAGNOSIS — Z7901 Long term (current) use of anticoagulants: Secondary | ICD-10-CM | POA: Insufficient documentation

## 2018-10-01 DIAGNOSIS — M549 Dorsalgia, unspecified: Secondary | ICD-10-CM | POA: Diagnosis not present

## 2018-10-01 DIAGNOSIS — E46 Unspecified protein-calorie malnutrition: Secondary | ICD-10-CM | POA: Insufficient documentation

## 2018-10-01 DIAGNOSIS — R5381 Other malaise: Secondary | ICD-10-CM | POA: Diagnosis not present

## 2018-10-01 DIAGNOSIS — R5383 Other fatigue: Secondary | ICD-10-CM | POA: Insufficient documentation

## 2018-10-01 DIAGNOSIS — Z7189 Other specified counseling: Secondary | ICD-10-CM

## 2018-10-01 LAB — CBC WITH DIFFERENTIAL/PLATELET
Abs Immature Granulocytes: 0.01 10*3/uL (ref 0.00–0.07)
Basophils Absolute: 0 10*3/uL (ref 0.0–0.1)
Basophils Relative: 1 %
Eosinophils Absolute: 0 10*3/uL (ref 0.0–0.5)
Eosinophils Relative: 2 %
HCT: 24.5 % — ABNORMAL LOW (ref 39.0–52.0)
Hemoglobin: 7.9 g/dL — ABNORMAL LOW (ref 13.0–17.0)
Immature Granulocytes: 1 %
Lymphocytes Relative: 24 %
Lymphs Abs: 0.4 10*3/uL — ABNORMAL LOW (ref 0.7–4.0)
MCH: 29.7 pg (ref 26.0–34.0)
MCHC: 32.2 g/dL (ref 30.0–36.0)
MCV: 92.1 fL (ref 80.0–100.0)
MONOS PCT: 11 %
Monocytes Absolute: 0.2 10*3/uL (ref 0.1–1.0)
Neutro Abs: 0.9 10*3/uL — ABNORMAL LOW (ref 1.7–7.7)
Neutrophils Relative %: 61 %
Platelets: 112 10*3/uL — ABNORMAL LOW (ref 150–400)
RBC: 2.66 MIL/uL — ABNORMAL LOW (ref 4.22–5.81)
RDW: 20.8 % — ABNORMAL HIGH (ref 11.5–15.5)
WBC: 1.5 10*3/uL — ABNORMAL LOW (ref 4.0–10.5)
nRBC: 0 % (ref 0.0–0.2)

## 2018-10-01 LAB — SAMPLE TO BLOOD BANK

## 2018-10-01 LAB — PREPARE RBC (CROSSMATCH)

## 2018-10-01 LAB — ABO/RH: ABO/RH(D): B POS

## 2018-10-01 MED ORDER — SODIUM CHLORIDE 0.9 % IV SOLN
Freq: Once | INTRAVENOUS | Status: AC
  Administered 2018-10-01: 10:00:00 via INTRAVENOUS
  Filled 2018-10-01: qty 250

## 2018-10-01 MED ORDER — HEPARIN SOD (PORK) LOCK FLUSH 100 UNIT/ML IV SOLN
500.0000 [IU] | Freq: Every day | INTRAVENOUS | Status: AC | PRN
  Administered 2018-10-01: 500 [IU]
  Filled 2018-10-01: qty 5

## 2018-10-01 MED ORDER — ACETAMINOPHEN 325 MG PO TABS
650.0000 mg | ORAL_TABLET | Freq: Once | ORAL | Status: AC
  Administered 2018-10-01: 650 mg via ORAL
  Filled 2018-10-01: qty 2

## 2018-10-01 MED ORDER — DIPHENHYDRAMINE HCL 25 MG PO CAPS
25.0000 mg | ORAL_CAPSULE | Freq: Once | ORAL | Status: AC
  Administered 2018-10-01: 25 mg via ORAL
  Filled 2018-10-01: qty 1

## 2018-10-01 MED ORDER — SODIUM CHLORIDE 0.9% IV SOLUTION
250.0000 mL | Freq: Once | INTRAVENOUS | Status: AC
  Administered 2018-10-01: 250 mL via INTRAVENOUS
  Filled 2018-10-01: qty 250

## 2018-10-02 LAB — TYPE AND SCREEN
ABO/RH(D): B POS
Antibody Screen: NEGATIVE
Unit division: 0

## 2018-10-02 LAB — BPAM RBC
Blood Product Expiration Date: 202001222359
ISSUE DATE / TIME: 202001061103
Unit Type and Rh: 7300

## 2018-10-03 ENCOUNTER — Telehealth: Payer: Self-pay | Admitting: Internal Medicine

## 2018-10-03 ENCOUNTER — Encounter: Payer: Self-pay | Admitting: Internal Medicine

## 2018-10-03 ENCOUNTER — Telehealth: Payer: Self-pay | Admitting: *Deleted

## 2018-10-03 NOTE — Telephone Encounter (Signed)
Patient called to report that this morning early, he had a nose bleed that took 3 hours to stop, it was oozing after there initial bleed occurred. 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 His last chemotherapy was on 12/27 and had labs 1/6 with a platelet count of 112. He is also on Xarelto. Please advise

## 2018-10-03 NOTE — Telephone Encounter (Signed)
Left a message with the patient-that I agree with the ER doctor recommendations of using Afrin.  Patient has multiple reasons for bloody nose-O2/Xarelto/Avastin chemotherapy.

## 2018-10-08 ENCOUNTER — Encounter: Payer: Self-pay | Admitting: Radiation Oncology

## 2018-10-08 ENCOUNTER — Ambulatory Visit
Admission: RE | Admit: 2018-10-08 | Discharge: 2018-10-08 | Disposition: A | Discharge status: Home / Self Care | Payer: 59 | Source: Ambulatory Visit | Attending: Radiation Oncology | Admitting: Radiation Oncology

## 2018-10-08 ENCOUNTER — Other Ambulatory Visit: Payer: Self-pay

## 2018-10-08 VITALS — BP 99/66 | HR 113 | Temp 98.1°F | Resp 18 | Wt 133.2 lb

## 2018-10-08 DIAGNOSIS — Z79899 Other long term (current) drug therapy: Secondary | ICD-10-CM | POA: Diagnosis not present

## 2018-10-08 DIAGNOSIS — Z923 Personal history of irradiation: Secondary | ICD-10-CM | POA: Insufficient documentation

## 2018-10-08 DIAGNOSIS — C3431 Malignant neoplasm of lower lobe, right bronchus or lung: Secondary | ICD-10-CM | POA: Diagnosis not present

## 2018-10-08 DIAGNOSIS — Z7901 Long term (current) use of anticoagulants: Secondary | ICD-10-CM | POA: Diagnosis not present

## 2018-10-08 NOTE — Progress Notes (Signed)
Pt presents today for f/u with Dr. Sondra Come. Pt is unaccompanied. Pt reports that fatigue has improved since the completion of radiation. Pt is still working full time nights as a Software engineer. Pt feels that he is moving air better. Pt is no longer on supplemental O2. Pt c/o right flank cramping, 2/10. Pt reports a "hacking fit" about once a day and then can keep "things opened up" by clearing throat. Pt reports sputum is clear to yellow, mostly clear. Pt denies hemoptysis. Pt denies difficulty swallowing. Pt denies any new c/o SOB or difficulty breathing.   BP 99/66   Pulse (!) 113   Temp 98.1 F (36.7 C) (Oral)   Resp 18   Wt 133 lb 3.2 oz (60.4 kg)   SpO2 99%   BMI 21.50 kg/m   Wt Readings from Last 3 Encounters:  10/08/18 133 lb 3.2 oz (60.4 kg)  09/20/18 130 lb (59 kg)  08/30/18 144 lb (65.3 kg)    Loma Sousa, RN BSN

## 2018-10-08 NOTE — Progress Notes (Signed)
Radiation Oncology         [phone] ________________________________  Name: Ruben Eaton MRN: [id number]  Date: 10/08/2018  DOB: [birth year]  Follow-Up Visit Note  CC: Dion Body, MD  Cammie Sickle, *    ICD-10-CM   1. 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  Cancer Staging Primary cancer of right lower lobe of lung ([address]) Staging form: Lung, AJCC 8th Edition - Clinical stage from 03/02/2018: Stage IV (cT4, cN0, pM1a) - Unsigned   Interval Since Last Radiation:  1 months  Radiation treatment dates:   08/20/2018-09/04/2018  Site/dose:   Right lung, 3 Gy in 10 fractions for a total dose of 30 Gy  Narrative:  The patient returns today for routine follow-up.  he is doing well overall. He did not require supplemental oxygen today.       On review of systems, he reports coughing fits lasting 5-10 minutes 1x per day. He has some lingering pain to the touch on his right side. he denies coughing up blood, sinus problems and any other symptoms. Pertinent positives are listed and detailed within the above HPI.                 ALLERGIES:  has No Known Allergies.  Meds: Current Outpatient Medications  Medication Sig Dispense Refill  . acetaminophen (TYLENOL) 325 MG tablet Take 2 tablets (650 mg total) by mouth every 6 (six) hours as needed for mild pain (or Fever >/= 101).    . B Complex-C (B-COMPLEX WITH VITAMIN C) tablet Take 1 tablet by mouth daily.    Marland Kitchen dicyclomine (BENTYL) 10 MG capsule Take 1 capsule (10 mg total) by mouth 4 (four) times daily -  before meals and at bedtime. 40 capsule 3  . dronabinol (MARINOL) 5 MG capsule Take 1 capsule (5 mg total) by mouth 2 (two) times daily before a meal. 60 capsule 0  . furosemide (LASIX) 20 MG tablet Take 1 tablet (20 mg total) by mouth daily as needed for edema. 30 tablet 6  . ibuprofen (ADVIL,MOTRIN) 200 MG tablet Take 800  mg by mouth daily as needed for mild pain.    Marland Kitchen lidocaine-prilocaine (EMLA) cream Apply 1 application topically as needed. Apply generously over the Mediport 45 minutes prior to chemotherapy. 30 g 0  . loratadine (CLARITIN) 10 MG tablet Take 10 mg by mouth daily as needed for allergies.     Marland Kitchen LORazepam (ATIVAN) 0.5 MG tablet Take 1 tablet (0.5 mg total) by mouth every 8 (eight) hours as needed for anxiety. 45 tablet 0  . metoprolol tartrate (LOPRESSOR) 25 MG tablet Take 1 tablet (25 mg total) by mouth 2 (two) times daily. 60 tablet 2  . morphine (MS CONTIN) 15 MG 12 hr tablet Take 1 tablet (15 mg total) by mouth as needed for pain. 60 tablet 0  . Morphine Sulfate (MORPHINE CONCENTRATE) 10 mg / 0.5 ml concentrated solution Take 0.5 mLs (10 mg total) by mouth every 6 (six) hours as needed for severe pain. 30 mL 0  . Multiple Vitamin (MULTIVITAMIN WITH MINERALS) TABS tablet Take 1 tablet by mouth daily.    Marland Kitchen oxyCODONE-acetaminophen (PERCOCET) 5-325 MG tablet Take 1 tablet by mouth every 12 (twelve) hours as needed for severe pain. 60 tablet 0  . prochlorperazine (COMPAZINE) 10 MG tablet Take 1 tablet (10 mg total) by mouth every 6 (six) hours  as needed (Nausea or vomiting). 30 tablet 1  . rivaroxaban (XARELTO) 20 MG TABS tablet Take 1 tablet (20 mg total) by mouth daily with supper. 90 tablet 3  . tadalafil (CIALIS) 5 MG tablet Take 5 mg by mouth daily as needed for erectile dysfunction.    . triamcinolone (NASACORT) 55 MCG/ACT AERO nasal inhaler Place 2 sprays into the nose daily as needed (congestion).     . Vitamin D, Ergocalciferol, (DRISDOL) 50000 units CAPS capsule Take 1 capsule (50,000 Units total) by mouth every 7 (seven) days. (Patient taking differently: Take 50,000 Units by mouth every 7 (seven) days. Mondays) 12 capsule 0  . zolpidem (AMBIEN) 10 MG tablet Take 10 mg by mouth at bedtime as needed for sleep.      No current facility-administered medications for this encounter.     Physical  Findings: The patient is in no acute distress. Patient is alert and oriented.  weight is 133 lb 3.2 oz (60.4 kg). His oral temperature is 98.1 F (36.7 C). His blood pressure is 99/66 and his pulse is 113 (abnormal). His respiration is 18 and oxygen saturation is 99%. .   Decreased breath sounds in the right lower lung field, possibly better compared to prior exam. Left lung is clear. Heart has regular rate and rhythm. No palpable cervical, supraclavicular, or axillary adenopathy. Abdomen soft, non-tender, normal bowel sounds. Cutaneous nodules along right chest appear to be smaller on exam today.    Lab Findings: Lab Results  Component Value Date   WBC 1.5 (L) 10/01/2018   HGB 7.9 (L) 10/01/2018   HCT 24.5 (L) 10/01/2018   MCV 92.1 10/01/2018   PLT 112 (L) 10/01/2018    Radiographic Findings: No results found.  Impression:  The patient is recovering from the effects of radiation.  Clinically stable. His breathing seems to be better and he is not requiring oxygen as much. His pain along the right chest area has also improved.   Plan:  Pt will proceed with chest CT scan tomorrow and then follow-up with medical oncology at [facility] Patient will see Dr. Marcie Mowers at [facility] for a consultation in the near future.  ____________________________________   Blair Promise, PhD, MD    This document serves as a record of services personally performed by Gery Pray, MD. It was created on his behalf by Mary-Margaret Loma Messing, a trained medical scribe. The creation of this record is based on the scribe's personal observations and the provider's statements to them. This document has been checked and approved by the attending provider.

## 2018-10-09 ENCOUNTER — Ambulatory Visit
Admission: RE | Admit: 2018-10-09 | Discharge: 2018-10-09 | Disposition: A | Discharge status: Home / Self Care | Payer: 59 | Source: Ambulatory Visit | Attending: Internal Medicine | Admitting: Internal Medicine

## 2018-10-09 DIAGNOSIS — C3490 Malignant neoplasm of unspecified part of unspecified bronchus or lung: Secondary | ICD-10-CM | POA: Diagnosis not present

## 2018-10-09 DIAGNOSIS — C3431 Malignant neoplasm of lower lobe, right bronchus or lung: Secondary | ICD-10-CM | POA: Insufficient documentation

## 2018-10-09 MED ORDER — IOPAMIDOL (ISOVUE-300) INJECTION 61%
60.0000 mL | Freq: Once | INTRAVENOUS | Status: AC | PRN
  Administered 2018-10-09: 60 mL via INTRAVENOUS

## 2018-10-11 ENCOUNTER — Inpatient Hospital Stay: Payer: 59

## 2018-10-11 ENCOUNTER — Inpatient Hospital Stay (HOSPITAL_BASED_OUTPATIENT_CLINIC_OR_DEPARTMENT_OTHER): Payer: 59 | Admitting: Internal Medicine

## 2018-10-11 ENCOUNTER — Encounter: Payer: Self-pay | Admitting: Internal Medicine

## 2018-10-11 ENCOUNTER — Other Ambulatory Visit: Payer: Self-pay

## 2018-10-11 ENCOUNTER — Other Ambulatory Visit: Payer: Self-pay | Admitting: Internal Medicine

## 2018-10-11 ENCOUNTER — Other Ambulatory Visit: Payer: Self-pay | Admitting: Nurse Practitioner

## 2018-10-11 VITALS — BP 92/60 | HR 112 | Temp 97.1°F | Resp 12 | Ht 66.0 in | Wt 129.2 lb

## 2018-10-11 DIAGNOSIS — R5383 Other fatigue: Secondary | ICD-10-CM | POA: Diagnosis not present

## 2018-10-11 DIAGNOSIS — E46 Unspecified protein-calorie malnutrition: Secondary | ICD-10-CM

## 2018-10-11 DIAGNOSIS — Z7189 Other specified counseling: Secondary | ICD-10-CM

## 2018-10-11 DIAGNOSIS — D6481 Anemia due to antineoplastic chemotherapy: Secondary | ICD-10-CM

## 2018-10-11 DIAGNOSIS — G4733 Obstructive sleep apnea (adult) (pediatric): Secondary | ICD-10-CM

## 2018-10-11 DIAGNOSIS — Z8673 Personal history of transient ischemic attack (TIA), and cerebral infarction without residual deficits: Secondary | ICD-10-CM

## 2018-10-11 DIAGNOSIS — Z87891 Personal history of nicotine dependence: Secondary | ICD-10-CM

## 2018-10-11 DIAGNOSIS — R5381 Other malaise: Secondary | ICD-10-CM | POA: Diagnosis not present

## 2018-10-11 DIAGNOSIS — Z9989 Dependence on other enabling machines and devices: Secondary | ICD-10-CM

## 2018-10-11 DIAGNOSIS — I10 Essential (primary) hypertension: Secondary | ICD-10-CM | POA: Diagnosis not present

## 2018-10-11 DIAGNOSIS — Z79899 Other long term (current) drug therapy: Secondary | ICD-10-CM

## 2018-10-11 DIAGNOSIS — Z5112 Encounter for antineoplastic immunotherapy: Secondary | ICD-10-CM | POA: Diagnosis not present

## 2018-10-11 DIAGNOSIS — G620 Drug-induced polyneuropathy: Secondary | ICD-10-CM | POA: Diagnosis not present

## 2018-10-11 DIAGNOSIS — Z923 Personal history of irradiation: Secondary | ICD-10-CM | POA: Diagnosis not present

## 2018-10-11 DIAGNOSIS — Z7901 Long term (current) use of anticoagulants: Secondary | ICD-10-CM

## 2018-10-11 DIAGNOSIS — T451X5S Adverse effect of antineoplastic and immunosuppressive drugs, sequela: Secondary | ICD-10-CM

## 2018-10-11 DIAGNOSIS — C3431 Malignant neoplasm of lower lobe, right bronchus or lung: Secondary | ICD-10-CM

## 2018-10-11 DIAGNOSIS — R05 Cough: Secondary | ICD-10-CM

## 2018-10-11 DIAGNOSIS — M549 Dorsalgia, unspecified: Secondary | ICD-10-CM

## 2018-10-11 DIAGNOSIS — G629 Polyneuropathy, unspecified: Secondary | ICD-10-CM

## 2018-10-11 LAB — URINALYSIS, COMPLETE (UACMP) WITH MICROSCOPIC
Bacteria, UA: NONE SEEN
Bilirubin Urine: NEGATIVE
Glucose, UA: NEGATIVE mg/dL
Hgb urine dipstick: NEGATIVE
KETONES UR: NEGATIVE mg/dL
Leukocytes, UA: NEGATIVE
Nitrite: NEGATIVE
Protein, ur: NEGATIVE mg/dL
Specific Gravity, Urine: 1.024 (ref 1.005–1.030)
pH: 5 (ref 5.0–8.0)

## 2018-10-11 LAB — COMPREHENSIVE METABOLIC PANEL
ALT: 20 U/L (ref 0–44)
AST: 24 U/L (ref 15–41)
Albumin: 2.8 g/dL — ABNORMAL LOW (ref 3.5–5.0)
Alkaline Phosphatase: 93 U/L (ref 38–126)
Anion gap: 8 (ref 5–15)
BILIRUBIN TOTAL: 0.5 mg/dL (ref 0.3–1.2)
BUN: 22 mg/dL — ABNORMAL HIGH (ref 6–20)
CO2: 25 mmol/L (ref 22–32)
Calcium: 9 mg/dL (ref 8.9–10.3)
Chloride: 103 mmol/L (ref 98–111)
Creatinine, Ser: 0.59 mg/dL — ABNORMAL LOW (ref 0.61–1.24)
GFR calc Af Amer: >60 mL/min (ref >60)
GFR calc non Af Amer: >60 mL/min (ref >60)
Glucose, Bld: 143 mg/dL — ABNORMAL HIGH (ref 70–99)
Potassium: 3.5 mmol/L (ref 3.5–5.1)
Sodium: 136 mmol/L (ref 135–145)
TOTAL PROTEIN: 6.4 g/dL — AB (ref 6.5–8.1)

## 2018-10-11 LAB — CBC WITH DIFFERENTIAL/PLATELET
Abs Immature Granulocytes: 0.01 10*3/uL (ref 0.00–0.07)
BASOS PCT: 1 %
Basophils Absolute: 0 10*3/uL (ref 0.0–0.1)
EOS ABS: 0 10*3/uL (ref 0.0–0.5)
Eosinophils Relative: 1 %
HCT: 29.3 % — ABNORMAL LOW (ref 39.0–52.0)
Hemoglobin: 9.3 g/dL — ABNORMAL LOW (ref 13.0–17.0)
Immature Granulocytes: 1 %
Lymphocytes Relative: 20 %
Lymphs Abs: 0.4 10*3/uL — ABNORMAL LOW (ref 0.7–4.0)
MCH: 29.9 pg (ref 26.0–34.0)
MCHC: 31.7 g/dL (ref 30.0–36.0)
MCV: 94.2 fL (ref 80.0–100.0)
Monocytes Absolute: 0.4 10*3/uL (ref 0.1–1.0)
Monocytes Relative: 20 %
Neutro Abs: 1.2 10*3/uL — ABNORMAL LOW (ref 1.7–7.7)
Neutrophils Relative %: 57 %
PLATELETS: 185 10*3/uL (ref 150–400)
RBC: 3.11 MIL/uL — ABNORMAL LOW (ref 4.22–5.81)
RDW: 19.9 % — ABNORMAL HIGH (ref 11.5–15.5)
WBC: 2 10*3/uL — ABNORMAL LOW (ref 4.0–10.5)
nRBC: 0 % (ref 0.0–0.2)

## 2018-10-11 LAB — TSH: TSH: 0.847 u[IU]/mL (ref 0.350–4.500)

## 2018-10-11 MED ORDER — MORPHINE SULFATE (CONCENTRATE) 10 MG /0.5 ML PO SOLN: 10.0000 mg | Freq: Four times a day (QID) | ORAL | 0 refills | Status: DC | PRN

## 2018-10-11 MED ORDER — SODIUM CHLORIDE 0.9 % IV SOLN
1200.0000 mg | Freq: Once | INTRAVENOUS | Status: AC
  Administered 2018-10-11: 1200 mg via INTRAVENOUS
  Filled 2018-10-11: qty 20

## 2018-10-11 MED ORDER — MORPHINE SULFATE ER 15 MG PO TBCR: 15.0000 mg | EXTENDED_RELEASE_TABLET | ORAL | 0 refills | Status: DC | PRN

## 2018-10-11 MED ORDER — SODIUM CHLORIDE 0.9 % IV SOLN
900.0000 mg | INTRAVENOUS | Status: DC
  Administered 2018-10-11: 900 mg via INTRAVENOUS
  Filled 2018-10-11: qty 32

## 2018-10-11 MED ORDER — SODIUM CHLORIDE 0.9 % IV SOLN
Freq: Once | INTRAVENOUS | Status: AC
  Administered 2018-10-11: 09:00:00 via INTRAVENOUS
  Filled 2018-10-11: qty 250

## 2018-10-11 MED ORDER — HEPARIN SOD (PORK) LOCK FLUSH 100 UNIT/ML IV SOLN
500.0000 [IU] | Freq: Once | INTRAVENOUS | Status: AC
  Administered 2018-10-11: 500 [IU] via INTRAVENOUS
  Filled 2018-10-11: qty 5

## 2018-10-11 NOTE — Progress Notes (Signed)
Patient here for pre treatment check. He reports new onset of right flank pain this week.

## 2018-10-11 NOTE — Assessment & Plan Note (Addendum)
#  Non-small cell lung cancer/stage IV-Currently on carbotaxol Avastin Tecentriq-4 cycles; October 09, 2018 CT scan-slight improvement of the right lung malignancy; however slightly increased infiltrative changes noted in the right upper lung [see discussion below].  #Continue Tecentriq-Avastin maintenance; hold off carbotaxol at this time. Labs today reviewed;  acceptable for treatment today.   #Right upper lobe infiltrate/worsening pain right posterior chest wall-concerning for malignancy/progression-however I suspect patient will not tolerate carbotaxol chemotherapy-second bone marrow reserves.  Recommend Tecentriq-Avastin maintenance.  We will repeat a scan after 2 more cycles.  If progression noted would recommend-Taxotere-cyramza.  Unfortunately poor prognosis.  #Anemia improved hemoglobin 9.3 from chemotherapy.  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  For now continue morphine 6-8 mg qhs as needed/Percocet 5 /325 every 12 hours as needed/MS Contin.  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  #Malnutrition Albumin-2.8 stable.  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  From Taxol.  Monitor for now.  #Discussed with Dr. Sondra Come radiation oncology. # I reviewed the blood work- with the patient in detail; also reviewed the imaging independently [as summarized above]; and with the patient in detail.    # DISPOSITION # Treatment today.  # follow up in 3 weeks-MD;labs-cbc/cmp/UA  avastin-tecentriq-Dr.B

## 2018-10-11 NOTE — Progress Notes (Signed)
[facility] OFFICE PROGRESS NOTE  Patient Care Team: Dion Body, MD as PCP - General (Family Medicine) [name], MD as PCP - Cardiology (Cardiology) Telford Nab, RN as Registered Nurse  Cancer Staging Primary cancer of right lower lobe of lung [facility]) Staging form: Lung, AJCC 8th Edition - Clinical stage from 03/02/2018: Stage IV (cT4, cN0, pM1a) - Unsigned    Oncology History   # June 2019- LUNG CA- HIGH GRADE CA with sarcomatoid features [EGFR MUTATED; exon 19; clinically non-small cell];Dr.Jeffrey Carlis Abbott; II opinion at [facility];  # June 17th- carbo-taxol #1; PROGRESSION June 26th 2019- Osimertinib 54m/day; [facility] June 28th, 2019]; reirradiation right chest wall finished radiation December 10th [Dr.Kinard]  # Oct 2019- Severe progression in chest; START carbo-Taxol-Tecentriq-Avastin x4 cycles; 10/09/2018- PR/?  Increasing infiltrate right upper lobe [?mixed response]; 1/16-[place]-Avastin  # Repeat chest Bx- Nov 2019- high grade Non-small cell with sarcomatoid features  # Dec 3rd 2019- Left Lung PE- lovenox  # HTN/ cryptogenic stroke;  # MOLECULAR TESTING- EGFR exon 19 deletion [omniseq]; Oct 2019- Plasma/guadant tetsing- EGFR exon 19del*   DIAGNOSIS: non-small cell lung ca  STAGE:  IV  ;GOALS: palliative  CURRENT/MOST RECENT THERAPY- Carbo-Taxol-Avastin-Tecentriq      Primary cancer of right lower lobe of lung (HCC)      INTERVAL HISTORY:  Ruben VALONE46y.o.  male pleasant patient above history of metastatic adenocarcinoma the lung/EGFR mutated is currently carbotaxol Tecentriq plus Avastin is here for follow-up status post 4 cycles approximately 3 weeks ago is here for follow-up/review the results of the CT scan.  Patient was evaluated by radiation oncology.  Patient had finished prior to radiation on December 10.  Patient continues to have chronic mild to moderate shortness of breath.  Chronic mild cough.  No  hemoptysis.  He is currently on Xarelto.  Patient continues to take liquid morphine/Percocetand MS Contin.  However notes to have pain worsening in the right flank/posterior chest wall.   Appetite is fair at best.  Mild weight loss.  Complains of numbness of his upper extremities.  Review of Systems  Constitutional: Positive for malaise/fatigue. Negative for chills, diaphoresis, fever and weight loss.  HENT: Negative for nosebleeds and sore throat.   Eyes: Negative for double vision.  Respiratory: Positive for cough and shortness of breath. Negative for hemoptysis, sputum production and wheezing.   Cardiovascular: Positive for chest pain (Right chest wall pain.). Negative for palpitations, orthopnea and leg swelling.  Gastrointestinal: Negative for blood in stool, constipation, heartburn, melena, nausea and vomiting.  Genitourinary: Positive for flank pain. Negative for dysuria, frequency and urgency.  Musculoskeletal: Positive for back pain. Negative for joint pain.  Skin: Negative.  Negative for itching and rash.  Neurological: Positive for tingling. Negative for dizziness, focal weakness, weakness and headaches.  Endo/Heme/Allergies: Does not bruise/bleed easily.  Psychiatric/Behavioral: Negative for depression. The patient is not nervous/anxious and does not have insomnia.       PAST MEDICAL HISTORY :  Past Medical History:  Diagnosis Date  . Anorexia   . [place]   a. 09/15/2016 in setting of PFO.  . CVA (cerebral vascular accident) ([place] 09/07/2016  . ED (erectile dysfunction)   . Essential (primary) hypertension 02/01/2016  . History of stroke 10/08/2016  . Hx of insomnia   . Hypertension   . OSA on CPAP   . PFO (patent foramen ovale)    a. s/p closure 09/2016 by Dr. CBurt Knack  . Pleural  effusion on right 02/21/2018  . Primary cancer of right lower lobe of lung ([place]) 02/25/2018   Chemo tx's and rad tx's  . PVC's (premature ventricular contractions)  10/08/2016  . 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 TIA (transient ischemic attack) 09/07/2016    PAST SURGICAL HISTORY :   Past Surgical History:  Procedure Laterality Date  . APPENDECTOMY  1980  . EYE SURGERY    . PATENT FORAMEN OVALE CLOSURE  10/07/2016  . PATENT FORAMEN OVALE CLOSURE N/A 10/07/2016   Procedure: Patent [place](PFO) Closure;  Surgeon: [name], MD;  Location: [facility];  Service: Cardiovascular;  Laterality: N/A;  . PORTACATH PLACEMENT N/A 02/23/2018   Procedure: INSERTION PORT-A-CATH;  Surgeon: Nestor Lewandowsky, MD;  Location: [facility] ORS;  Service: Thoracic;  Laterality: N/A;  . STRABISMUS SURGERY Left 1984  . TEE WITHOUT CARDIOVERSION N/A 09/08/2016   Procedure: TRANSESOPHAGEAL ECHOCARDIOGRAM (TEE);  Surgeon: [name], MD;  Location: [facility] ORS;  Service: Cardiovascular;  Laterality: N/A;  . VIDEO ASSISTED THORACOSCOPY (VATS) W/TALC PLEUADESIS Right 02/23/2018   Procedure: VIDEO ASSISTED THORACOSCOPY (VATS) W/TALC PLEUADESIS;  Surgeon: Nestor Lewandowsky, MD;  Location: [facility] ORS;  Service: Thoracic;  Laterality: Right;  . VITRECTOMY Right 2013    FAMILY HISTORY :   Family History  Problem Relation Age of Onset  . Other Paternal Uncle        Abdominal tumor     SOCIAL HISTORY:   Social History   Tobacco Use  . Smoking status: Former Smoker    Packs/day: 0.50    Years: 10.00    Pack years: 5.00    Last attempt to quit: 2011    Years since quitting: 9.0  . Smokeless tobacco: Former Systems developer    Types: Snuff  Substance Use Topics  . Alcohol use: Yes    Alcohol/week: 3.0 standard drinks    Types: 3 Cans of beer per week  . Drug use: No    ALLERGIES:  has No Known Allergies.  MEDICATIONS:  Current Outpatient Medications  Medication Sig Dispense Refill  . acetaminophen (TYLENOL) 325 MG tablet Take 2 tablets (650 mg total) by mouth every 6 (six) hours as needed for mild pain (or Fever >/= 101).    . B Complex-C (B-COMPLEX  WITH VITAMIN C) tablet Take 1 tablet by mouth daily.    Marland Kitchen dicyclomine (BENTYL) 10 MG capsule Take 1 capsule (10 mg total) by mouth 4 (four) times daily -  before meals and at bedtime. 40 capsule 3  . dronabinol (MARINOL) 5 MG capsule Take 1 capsule (5 mg total) by mouth 2 (two) times daily before a meal. 60 capsule 0  . furosemide (LASIX) 20 MG tablet Take 1 tablet (20 mg total) by mouth daily as needed for edema. 30 tablet 6  . ibuprofen (ADVIL,MOTRIN) 200 MG tablet Take 800 mg by mouth daily as needed for mild pain.    Marland Kitchen lidocaine-prilocaine (EMLA) cream Apply 1 application topically as needed. Apply generously over the Mediport 45 minutes prior to chemotherapy. 30 g 0  . loratadine (CLARITIN) 10 MG tablet Take 10 mg by mouth daily as needed for allergies.     Marland Kitchen LORazepam (ATIVAN) 0.5 MG tablet Take 1 tablet (0.5 mg total) by mouth every 8 (eight) hours as needed for anxiety. 45 tablet 0  . metoprolol tartrate (LOPRESSOR) 25 MG tablet Take 1 tablet (25 mg total) by mouth 2 (two) times daily. 60 tablet 2  . Multiple Vitamin (  MULTIVITAMIN WITH MINERALS) TABS tablet Take 1 tablet by mouth daily.    Marland Kitchen oxyCODONE-acetaminophen (PERCOCET) 5-325 MG tablet Take 1 tablet by mouth every 12 (twelve) hours as needed for severe pain. 60 tablet 0  . prochlorperazine (COMPAZINE) 10 MG tablet Take 1 tablet (10 mg total) by mouth every 6 (six) hours as needed (Nausea or vomiting). 30 tablet 1  . rivaroxaban (XARELTO) 20 MG TABS tablet Take 1 tablet (20 mg total) by mouth daily with supper. 90 tablet 3  . tadalafil (CIALIS) 5 MG tablet Take 5 mg by mouth daily as needed for erectile dysfunction.    . triamcinolone (NASACORT) 55 MCG/ACT AERO nasal inhaler Place 2 sprays into the nose daily as needed (congestion).     . Vitamin D, Ergocalciferol, (DRISDOL) 50000 units CAPS capsule Take 1 capsule (50,000 Units total) by mouth every 7 (seven) days. (Patient taking differently: Take 50,000 Units by mouth every 7 (seven)  days. Mondays) 12 capsule 0  . zolpidem (AMBIEN) 10 MG tablet Take 10 mg by mouth at bedtime as needed for sleep.     Marland Kitchen morphine (MS CONTIN) 15 MG 12 hr tablet Take 1 tablet (15 mg total) by mouth as needed for pain. 60 tablet 0  . Morphine Sulfate (MORPHINE CONCENTRATE) 10 mg / 0.5 ml concentrated solution Take 0.5 mLs (10 mg total) by mouth every 6 (six) hours as needed for severe pain. 30 mL 0   No current facility-administered medications for this visit.     PHYSICAL EXAMINATION: ECOG PERFORMANCE STATUS: 1 - Symptomatic but completely ambulatory  BP 92/60 (BP Location: Right Arm, Patient Position: Sitting)   Pulse (!) 112   Temp (!) 97.1 F (36.2 C) (Tympanic)   Resp 12   Ht _0  (1.676 m)   Wt 129 lb 3.2 oz (58.6 kg)   BMI 20.85 kg/m   Filed Weights   10/11/18 0822  Weight: 129 lb 3.2 oz (58.6 kg)    Physical Exam  Constitutional: He is oriented to person, place, and time.  He is accompanied by his wife.  He appears cachectic.  He is walking himself.  Not on oxygen.  HENT:  Head: Normocephalic and atraumatic.  Mouth/Throat: Oropharynx is clear and moist. No oropharyngeal exudate.  Eyes: Pupils are equal, round, and reactive to light.  Neck: Normal range of motion. Neck supple.  Cardiovascular: Normal rate and regular rhythm.  Pulmonary/Chest: No respiratory distress. He has no wheezes.  Decreased breath sounds right side most of the bases.  Right chest wall approximately; approximately 2 to 3 cm in size (improved)  Posteriorly mid scapular region-approximately 2 to 3 cm lump noted/ new.   Abdominal: Soft. Bowel sounds are normal. He exhibits no distension and no mass. There is no abdominal tenderness. There is no rebound and no guarding.  Musculoskeletal: Normal range of motion.        General: No tenderness or edema.  Neurological: He is alert and oriented to person, place, and time.  Skin: Skin is warm.  Psychiatric: Affect normal.       LABORATORY DATA:   I have reviewed the data as listed    Component Value Date/Time   NA 136 10/11/2018 0803   NA 141 10/05/2016 0848   K 3.5 10/11/2018 0803   K 4.3 05/15/2013 0612   CL 103 10/11/2018 0803   CO2 25 10/11/2018 0803   GLUCOSE 143 (H) 10/11/2018 0803   BUN 22 (H) 10/11/2018 0803   BUN 17 10/05/2016  0848   CREATININE 0.59 (L) 10/11/2018 0803   CALCIUM 9.0 10/11/2018 0803   PROT 6.4 (L) 10/11/2018 0803   ALBUMIN 2.8 (L) 10/11/2018 0803   AST 24 10/11/2018 0803   ALT 20 10/11/2018 0803   ALKPHOS 93 10/11/2018 0803   BILITOT 0.5 10/11/2018 0803   GFRNONAA >60 10/11/2018 0803   GFRAA >60 10/11/2018 0803    No results found for: SPEP, UPEP  Lab Results  Component Value Date   WBC 2.0 (L) 10/11/2018   NEUTROABS 1.2 (L) 10/11/2018   HGB 9.3 (L) 10/11/2018   HCT 29.3 (L) 10/11/2018   MCV 94.2 10/11/2018   PLT 185 10/11/2018      Chemistry      Component Value Date/Time   NA 136 10/11/2018 0803   NA 141 10/05/2016 0848   K 3.5 10/11/2018 0803   K 4.3 05/15/2013 0612   CL 103 10/11/2018 0803   CO2 25 10/11/2018 0803   BUN 22 (H) 10/11/2018 0803   BUN 17 10/05/2016 0848   CREATININE 0.59 (L) 10/11/2018 0803      Component Value Date/Time   CALCIUM 9.0 10/11/2018 0803   ALKPHOS 93 10/11/2018 0803   AST 24 10/11/2018 0803   ALT 20 10/11/2018 0803   BILITOT 0.5 10/11/2018 0803       RADIOGRAPHIC STUDIES: I have personally reviewed the radiological images as listed and agreed with the findings in the report. No results found.   ASSESSMENT & PLAN:  Primary cancer of right lower lobe of lung ([place]) #Non-small cell lung cancer/stage IV-Currently on carbotaxol Avastin Tecentriq-4 cycles; October 09, 2018 CT scan-slight improvement of the right lung malignancy; however slightly increased infiltrative changes noted in the right upper lung [see discussion below].  #Continue Tecentriq-Avastin maintenance; hold off carbotaxol at this time. Labs today reviewed;  acceptable for  treatment today.   #Right upper lobe infiltrate/worsening pain right posterior chest wall-concerning for malignancy/progression-however I suspect patient will not tolerate carbotaxol chemotherapy-second bone marrow reserves.  Recommend Tecentriq-Avastin maintenance.  We will repeat a scan after 2 more cycles.  If progression noted would recommend-Taxotere-cyramza.  Unfortunately poor prognosis.  #Anemia improved hemoglobin 9.3 from chemotherapy.  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  For now continue morphine 6-8 mg qhs as needed/Percocet 5 /325 every 12 hours as needed/MS Contin.  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  #Malnutrition Albumin-2.8 stable.  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  From Taxol.  Monitor for now.  #Discussed with Dr. Sondra Come radiation oncology. # I reviewed the blood work- with the patient in detail; also reviewed the imaging independently [as summarized above]; and with the patient in detail.    # DISPOSITION # Treatment today.  # follow up in 3 weeks-MD;labs-cbc/cmp/UA  avastin-tecentriq-Dr.B   No orders of the defined types were placed in this encounter.  All questions were answered. The patient knows to call the clinic with any problems, questions or concerns.      Cammie Sickle, MD 10/11/2018 8:51 PM

## 2018-10-11 NOTE — Telephone Encounter (Signed)
Patient called Ruben Eaton requesting refill of Morphine Concentrate Solution and MS Contin.   As mandated by the [place] STOP Act (Strengthen Opioid Misuse Prevention), the [place] Controlled Substance Reporting System ([place]) was reviewed for this patient.  Below is the past 66-months of controlled substance prescriptions as displayed by the registry.  I have personally consulted with my supervising physician, Dr. Rogue Bussing, who agrees that continuation of opiate therapy is medically appropriate at this time and agrees to provide continual monitoring, including urine/blood drug screens, as indicated. Prescription sent electronically using [employer] secure transmission to requested pharmacy.   [place] Reviewed:     Ruben Rutter, DNP, AGNP-C [place] [facility] [phone] (work cell) [phone] (office)

## 2018-10-11 NOTE — Progress Notes (Signed)
Proceed with treatment today per Dr. Rogue Bussing

## 2018-10-11 NOTE — Progress Notes (Signed)
HR 112, ok to proceed per MD.  Patient only getting tecentriq/avastin today.

## 2018-10-12 ENCOUNTER — Other Ambulatory Visit: Payer: Self-pay

## 2018-10-12 ENCOUNTER — Emergency Department: Payer: 59

## 2018-10-12 ENCOUNTER — Emergency Department
Admission: EM | Admit: 2018-10-12 | Discharge: 2018-10-12 | Disposition: A | Discharge status: Home / Self Care | Payer: 59 | Attending: Emergency Medicine | Admitting: Emergency Medicine

## 2018-10-12 DIAGNOSIS — Z9221 Personal history of antineoplastic chemotherapy: Secondary | ICD-10-CM | POA: Diagnosis not present

## 2018-10-12 DIAGNOSIS — I10 Essential (primary) hypertension: Secondary | ICD-10-CM | POA: Insufficient documentation

## 2018-10-12 DIAGNOSIS — R109 Unspecified abdominal pain: Secondary | ICD-10-CM | POA: Diagnosis not present

## 2018-10-12 DIAGNOSIS — R309 Painful micturition, unspecified: Secondary | ICD-10-CM | POA: Diagnosis not present

## 2018-10-12 DIAGNOSIS — Z87891 Personal history of nicotine dependence: Secondary | ICD-10-CM | POA: Insufficient documentation

## 2018-10-12 DIAGNOSIS — Z79899 Other long term (current) drug therapy: Secondary | ICD-10-CM | POA: Diagnosis not present

## 2018-10-12 DIAGNOSIS — Z85118 Personal history of other malignant neoplasm of bronchus and lung: Secondary | ICD-10-CM | POA: Diagnosis not present

## 2018-10-12 DIAGNOSIS — Z7901 Long term (current) use of anticoagulants: Secondary | ICD-10-CM | POA: Insufficient documentation

## 2018-10-12 DIAGNOSIS — Z923 Personal history of irradiation: Secondary | ICD-10-CM | POA: Insufficient documentation

## 2018-10-12 DIAGNOSIS — Z8673 Personal history of transient ischemic attack (TIA), and cerebral infarction without residual deficits: Secondary | ICD-10-CM | POA: Insufficient documentation

## 2018-10-12 LAB — COMPREHENSIVE METABOLIC PANEL
ALBUMIN: 2.7 g/dL — AB (ref 3.5–5.0)
ALT: 18 U/L (ref 0–44)
AST: 20 U/L (ref 15–41)
Alkaline Phosphatase: 88 U/L (ref 38–126)
Anion gap: 6 (ref 5–15)
BILIRUBIN TOTAL: 0.7 mg/dL (ref 0.3–1.2)
BUN: 18 mg/dL (ref 6–20)
CALCIUM: 8.8 mg/dL — AB (ref 8.9–10.3)
CO2: 26 mmol/L (ref 22–32)
Chloride: 101 mmol/L (ref 98–111)
Creatinine, Ser: 0.44 mg/dL — ABNORMAL LOW (ref 0.61–1.24)
GFR calc Af Amer: >60 mL/min (ref >60)
GFR calc non Af Amer: >60 mL/min (ref >60)
Glucose, Bld: 92 mg/dL (ref 70–99)
Potassium: 3.6 mmol/L (ref 3.5–5.1)
Sodium: 133 mmol/L — ABNORMAL LOW (ref 135–145)
Total Protein: 5.9 g/dL — ABNORMAL LOW (ref 6.5–8.1)

## 2018-10-12 LAB — CBC WITH DIFFERENTIAL/PLATELET
Abs Immature Granulocytes: 0 10*3/uL (ref 0.00–0.07)
BASOS ABS: 0 10*3/uL (ref 0.0–0.1)
Basophils Relative: 1 %
EOS PCT: 1 %
Eosinophils Absolute: 0 10*3/uL (ref 0.0–0.5)
HCT: 25.9 % — ABNORMAL LOW (ref 39.0–52.0)
Hemoglobin: 8.7 g/dL — ABNORMAL LOW (ref 13.0–17.0)
Immature Granulocytes: 0 %
Lymphocytes Relative: 17 %
Lymphs Abs: 0.4 10*3/uL — ABNORMAL LOW (ref 0.7–4.0)
MCH: 31.2 pg (ref 26.0–34.0)
MCHC: 33.6 g/dL (ref 30.0–36.0)
MCV: 92.8 fL (ref 80.0–100.0)
Monocytes Absolute: 0.5 10*3/uL (ref 0.1–1.0)
Monocytes Relative: 21 %
Neutro Abs: 1.4 10*3/uL — ABNORMAL LOW (ref 1.7–7.7)
Neutrophils Relative %: 60 %
Platelets: 173 10*3/uL (ref 150–400)
RBC: 2.79 MIL/uL — ABNORMAL LOW (ref 4.22–5.81)
RDW: 19.3 % — ABNORMAL HIGH (ref 11.5–15.5)
WBC: 2.2 10*3/uL — ABNORMAL LOW (ref 4.0–10.5)
nRBC: 0 % (ref 0.0–0.2)

## 2018-10-12 LAB — URINALYSIS, COMPLETE (UACMP) WITH MICROSCOPIC
Bacteria, UA: NONE SEEN
Bilirubin Urine: NEGATIVE
Glucose, UA: NEGATIVE mg/dL
HGB URINE DIPSTICK: NEGATIVE
Ketones, ur: NEGATIVE mg/dL
Leukocytes, UA: NEGATIVE
NITRITE: NEGATIVE
Protein, ur: NEGATIVE mg/dL
Specific Gravity, Urine: 1.019 (ref 1.005–1.030)
Squamous Epithelial / HPF: NONE SEEN (ref 0–5)
pH: 6 (ref 5.0–8.0)

## 2018-10-12 LAB — LIPASE, BLOOD: Lipase: 26 U/L (ref 11–51)

## 2018-10-12 MED ORDER — ONDANSETRON HCL 4 MG/2ML IJ SOLN
4.0000 mg | Freq: Once | INTRAMUSCULAR | Status: AC
  Administered 2018-10-12: 4 mg via INTRAVENOUS
  Filled 2018-10-12: qty 2

## 2018-10-12 MED ORDER — MORPHINE SULFATE (PF) 2 MG/ML IV SOLN
2.0000 mg | Freq: Once | INTRAVENOUS | Status: AC
  Administered 2018-10-12: 2 mg via INTRAVENOUS
  Filled 2018-10-12: qty 1

## 2018-10-12 MED ORDER — MORPHINE SULFATE (PF) 4 MG/ML IV SOLN
4.0000 mg | Freq: Once | INTRAVENOUS | Status: AC
  Administered 2018-10-12: 2 mg via INTRAVENOUS
  Filled 2018-10-12: qty 1

## 2018-10-12 MED ORDER — HEPARIN SOD (PORK) LOCK FLUSH 100 UNIT/ML IV SOLN
500.0000 [IU] | Freq: Once | INTRAVENOUS | Status: AC
  Administered 2018-10-12: 500 [IU] via INTRAVENOUS
  Filled 2018-10-12: qty 5

## 2018-10-12 NOTE — ED Notes (Signed)
Pt reports taking PO pain meds at home about 0945 this morning.

## 2018-10-12 NOTE — ED Notes (Signed)
Informed Dr. Clearnce Hasten pt only wants 2mg  of morphine, ok to give only 2mg  of morphine at this time.

## 2018-10-12 NOTE — ED Notes (Signed)
Patient reports he can not urinate at this time due to urinating right before he came in

## 2018-10-12 NOTE — ED Notes (Signed)
Patient verbalized understanding of discharge instructions, no questions. Patient out of ED via wheelchair in no distress.  

## 2018-10-12 NOTE — ED Notes (Signed)
Patient transported to CT 

## 2018-10-12 NOTE — ED Provider Notes (Signed)
[facility] Emergency Department Provider Note  ____________________________________________   First MD Initiated Contact with Patient 10/12/18 1020     (approximate)  I have reviewed the triage vital signs and the nursing notes.   HISTORY  Chief Complaint Back Pain and Abdominal Pain   HPI Ruben Eaton is a 48 y.o. male with a history of a primary cancer of the right lower lung status post PE on Xarelto who was presented emergency department with right lower flank pain.  Patient says that his pain is only a 2-3 out of 10 at this time.  However, took morphine this morning which did not relieve it.  Says the pain is positional and that he thinks he can find a position of comfort but only last for approximately 1 hour or so.  Also with pain with urination but does not report any blood in his urine, chills or fever.  Currently on carbotaxol Avastin Tecentriq.  Last treatment yesterday.   Past Medical History:  Diagnosis Date  . Anorexia   . 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 09/15/2016 in setting of PFO.  . CVA (cerebral vascular accident) ([place]) 09/07/2016  . ED (erectile dysfunction)   . Essential (primary) hypertension 02/01/2016  . History of stroke 10/08/2016  . Hx of insomnia   . Hypertension   . OSA on CPAP   . PFO (patent foramen ovale)    a. s/p closure 09/2016 by Dr. [name].  . Pleural effusion on right 02/21/2018  . Primary cancer of right lower lobe of lung ([place]) 02/25/2018   Chemo tx's and rad tx's  . PVC's (premature ventricular contractions) 10/08/2016  . 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 TIA (transient ischemic attack) 09/07/2016    Patient Active Problem List   Diagnosis Date Noted  . Hypertension   . 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 Non-small cell cancer of left lung ([place]) 03/22/2018  . Anasarca 03/21/2018  . Acute respiratory failure with hypoxia ([place]) 03/21/2018  . Pure hypercholesterolemia 03/08/2018  . Goals  of care, counseling/discussion 03/02/2018  . Anxiety 03/02/2018  . Primary cancer of right lower lobe of lung ([place]) 02/25/2018  . Pleural effusion 02/21/2018  . Vaccine counseling 09/01/2017  . OSA on CPAP 10/27/2016  . History of stroke 10/08/2016  . PVC's (premature ventricular contractions) 10/08/2016  . PFO (patent foramen ovale) 10/07/2016  . TIA (transient ischemic attack) 09/07/2016  . CVA (cerebral vascular accident) ([place]) 09/07/2016  . 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 Essential (primary) hypertension 02/01/2016  . H/O disease 02/01/2016  . Quadriceps tendinitis 02/01/2016    Past Surgical History:  Procedure Laterality Date  . APPENDECTOMY  1980  . EYE SURGERY    . PATENT FORAMEN OVALE CLOSURE  10/07/2016  . PATENT FORAMEN OVALE CLOSURE N/A 10/07/2016   Procedure: Patent For[place](PFO) Closure;  Surgeon: [name], MD;  Location: [place] [facility];  Service: Cardiovascular;  Laterality: N/A;  . PORTACATH PLACEMENT N/A 02/23/2018   Procedure: INSERTION PORT-A-CATH;  Surgeon: Nestor Lewandowsky, MD;  Location: [facility] ORS;  Service: Thoracic;  Laterality: N/A;  . STRABISMUS SURGERY Left 1984  . TEE WITHOUT CARDIOVERSION N/A 09/08/2016   Procedure: TRANSESOPHAGEAL ECHOCARDIOGRAM (TEE);  Surgeon: [name], MD;  Location: [facility] ORS;  Service: Cardiovascular;  Laterality: N/A;  . VIDEO ASSISTED THORACOSCOPY (VATS) W/TALC PLEUADESIS Right 02/23/2018   Procedure: VIDEO ASSISTED THORACOSCOPY (VATS) W/TALC PLEUADESIS;  Surgeon: Nestor Lewandowsky, MD;  Location: [facility]  ORS;  Service: Thoracic;  Laterality: Right;  . VITRECTOMY Right 2013    Prior to Admission medications   Medication Sig Start Date End Date Taking? Authorizing Provider  acetaminophen (TYLENOL) 325 MG tablet Take 2 tablets (650 mg total) by mouth every 6 (six) hours as needed for mild pain (or Fever >/= 101). 03/27/18   Debbe Odea, MD  B Complex-C (B-COMPLEX WITH VITAMIN C) tablet Take 1  tablet by mouth daily.    [provider]  dicyclomine (BENTYL) 10 MG capsule Take 1 capsule (10 mg total) by mouth 4 (four) times daily -  before meals and at bedtime. 05/25/18   Cammie Sickle, MD  dronabinol (MARINOL) 5 MG capsule Take 1 capsule (5 mg total) by mouth 2 (two) times daily before a meal. 09/21/18   Verlon Au, NP  furosemide (LASIX) 20 MG tablet Take 1 tablet (20 mg total) by mouth daily as needed for edema. 06/18/18 09/21/23  Leanor Kail, PA  ibuprofen (ADVIL,MOTRIN) 200 MG tablet Take 800 mg by mouth daily as needed for mild pain.    [provider]  lidocaine-prilocaine (EMLA) cream Apply 1 application topically as needed. Apply generously over the Mediport 45 minutes prior to chemotherapy. 05/11/18   Cammie Sickle, MD  loratadine (CLARITIN) 10 MG tablet Take 10 mg by mouth daily as needed for allergies.     [provider]  LORazepam (ATIVAN) 0.5 MG tablet Take 1 tablet (0.5 mg total) by mouth every 8 (eight) hours as needed for anxiety. 09/12/18   Cammie Sickle, MD  metoprolol tartrate (LOPRESSOR) 25 MG tablet Take 1 tablet (25 mg total) by mouth 2 (two) times daily. 02/27/18   Dustin Flock, MD  morphine (MS CONTIN) 15 MG 12 hr tablet Take 1 tablet (15 mg total) by mouth as needed for pain. 10/11/18   Verlon Au, NP  Morphine Sulfate (MORPHINE CONCENTRATE) 10 mg / 0.5 ml concentrated solution Take 0.5 mLs (10 mg total) by mouth every 6 (six) hours as needed for severe pain. 10/11/18   Verlon Au, NP  Multiple Vitamin (MULTIVITAMIN WITH MINERALS) TABS tablet Take 1 tablet by mouth daily.    [provider]  oxyCODONE-acetaminophen (PERCOCET) 5-325 MG tablet Take 1 tablet by mouth every 12 (twelve) hours as needed for severe pain. 09/28/18   Verlon Au, NP  prochlorperazine (COMPAZINE) 10 MG tablet Take 1 tablet (10 mg total) by mouth every 6 (six) hours as needed (Nausea or vomiting). 07/20/18    Cammie Sickle, MD  rivaroxaban (XARELTO) 20 MG TABS tablet Take 1 tablet (20 mg total) by mouth daily with supper. 09/28/18   Cammie Sickle, MD  tadalafil (CIALIS) 5 MG tablet Take 5 mg by mouth daily as needed for erectile dysfunction.    [provider]  triamcinolone (NASACORT) 55 MCG/ACT AERO nasal inhaler Place 2 sprays into the nose daily as needed (congestion).     [provider]  Vitamin D, Ergocalciferol, (DRISDOL) 50000 units CAPS capsule Take 1 capsule (50,000 Units total) by mouth every 7 (seven) days. Patient taking differently: Take 50,000 Units by mouth every 7 (seven) days. Mondays 05/05/16   Lyndal Pulley, DO  zolpidem (AMBIEN) 10 MG tablet Take 10 mg by mouth at bedtime as needed for sleep.  12/10/15   [provider]    Allergies Patient has no known allergies.  Family History  Problem Relation Age of Onset  . Other Paternal Uncle  Abdominal tumor     Social History Social History   Tobacco Use  . Smoking status: Former Smoker    Packs/day: 0.50    Years: 10.00    Pack years: 5.00    Last attempt to quit: 2011    Years since quitting: 9.0  . Smokeless tobacco: Former Systems developer    Types: Snuff  Substance Use Topics  . Alcohol use: Yes    Alcohol/week: 3.0 standard drinks    Types: 3 Cans of beer per week  . Drug use: No    Review of Systems  Constitutional: No fever/chills Eyes: No visual changes. ENT: No sore throat. Cardiovascular: Denies chest pain. Respiratory: Denies shortness of breath. Gastrointestinal: No abdominal pain.  No nausea, no vomiting.  No diarrhea.  No constipation. Genitourinary: As above Musculoskeletal: As above Skin: Negative for rash. Neurological: Negative for headaches, focal weakness or numbness.   ____________________________________________   PHYSICAL EXAM:  VITAL SIGNS: ED Triage Vitals  Enc Vitals Group     BP 10/12/18 1011 105/74     Pulse Rate 10/12/18 1011 (!)  102     Resp 10/12/18 1011 16     Temp 10/12/18 1011 97.7 F (36.5 C)     Temp Source 10/12/18 1011 Oral     SpO2 10/12/18 1011 100 %     Weight 10/12/18 1012 129 lb (58.5 kg)     Height 10/12/18 1012 5\' 6"  (1.676 m)     Head Circumference --      Peak Flow --      Pain Score 10/12/18 1012 3     Pain Loc --      Pain Edu? --      Excl. in [place]? --     Constitutional: Alert and oriented. Well appearing and in no acute distress. Eyes: Conjunctivae are normal.  Head: Atraumatic. Nose: No congestion/rhinnorhea. Mouth/Throat: Mucous membranes are moist.  Neck: No stridor.   Cardiovascular: Normal rate, regular rhythm. Grossly normal heart sounds.  Respiratory: Normal respiratory effort.  No retractions. Lungs CTAB. Gastrointestinal: Soft with minimal tenderness to palpation of the right upper quadrant with a negative Murphy sign.  No distention.  Moderate right lower CVA tenderness to palpation. Musculoskeletal: No lower extremity tenderness nor edema.  No joint effusions. Neurologic:  Normal speech and language. No gross focal neurologic deficits are appreciated. Skin:  Skin is warm, dry and intact. No rash noted. Psychiatric: Mood and affect are normal. Speech and behavior are normal.  ____________________________________________   LABS (all labs ordered are listed, but only abnormal results are displayed)  Labs Reviewed  CBC WITH DIFFERENTIAL/PLATELET - Abnormal; Notable for the following components:      Result Value   WBC 2.2 (*)    RBC 2.79 (*)    Hemoglobin 8.7 (*)    HCT 25.9 (*)    RDW 19.3 (*)    Neutro Abs 1.4 (*)    Lymphs Abs 0.4 (*)    All other components within normal limits  COMPREHENSIVE METABOLIC PANEL - Abnormal; Notable for the following components:   Sodium 133 (*)    Creatinine, Ser 0.44 (*)    Calcium 8.8 (*)    Total Protein 5.9 (*)    Albumin 2.7 (*)    All other components within normal limits  URINALYSIS, COMPLETE (UACMP) WITH MICROSCOPIC -  Abnormal; Notable for the following components:   Color, Urine YELLOW (*)    APPearance CLEAR (*)    All other components within normal  limits  LIPASE, BLOOD   ____________________________________________  EKG   ____________________________________________  RADIOLOGY  Pleural masses extending into the right upper quadrant interface in the liver and elevated the right kidney.  Mass increased from 07/15/2018 and similar to 09/14/2018.  Mass-effect on the kidney potentially cause right flank pain.  No additional acute findings. ____________________________________________   PROCEDURES  Procedure(s) performed:   Procedures  Critical Care performed:   ____________________________________________   INITIAL IMPRESSION / ASSESSMENT AND PLAN / ED COURSE  Pertinent labs & imaging results that were available during my care of the patient were reviewed by me and considered in my medical decision making (see chart for details).  Differential diagnosis includes, but is not limited to, biliary disease (biliary colic, acute cholecystitis, cholangitis, choledocholithiasis, etc), intrathoracic causes for epigastric abdominal pain including ACS, gastritis, duodenitis, pancreatitis, small bowel or large bowel obstruction, abdominal aortic aneurysm, hernia, and ulcer(s). As part of my medical decision making, I reviewed the following data within the [place] patient's most recent CAT scan as well as recent oncology notes from yesterday.  ----------------------------------------- 1:27 PM on 10/12/2018 -----------------------------------------  Updated patient regarding his CT read.  Urinalysis is also reassuring infection.  Likely pain from tumor mass.  Patient says that he has had similar pain in the past does not is intense.  Patient also to start taking MiraLAX he says he has had some issues with ongoing constipation secondary to his opiates.  We will follow-up with  his oncologist, Dr. Rogue Bussing. ____________________________________________   FINAL CLINICAL IMPRESSION(S) / ED DIAGNOSES  Flank pain.  NEW MEDICATIONS STARTED DURING THIS VISIT:  New Prescriptions   No medications on file     Note:  This document was prepared using Dragon voice recognition software and may include unintentional dictation errors.     Orbie Pyo, MD 10/12/18 1328

## 2018-10-12 NOTE — ED Notes (Signed)
ED Provider at bedside. 

## 2018-10-12 NOTE — ED Notes (Signed)
Pt given cup for urine sample, states he is unable to void at this time, voided prior to arrival.

## 2018-10-12 NOTE — ED Triage Notes (Signed)
Pt states that he had a chemo infusion yesterday for stage 4 lung cancer, pt states that the pain started on Monday in his rt flank, reports pain also in the rlq. Pt denies hx of kidney stones, some discomfort in his back with urination. Pt reports no longer having an appendix

## 2018-10-19 DIAGNOSIS — J918 Pleural effusion in other conditions classified elsewhere: Secondary | ICD-10-CM | POA: Diagnosis not present

## 2018-10-19 DIAGNOSIS — C3431 Malignant neoplasm of lower lobe, right bronchus or lung: Secondary | ICD-10-CM | POA: Diagnosis not present

## 2018-10-22 DIAGNOSIS — C3491 Malignant neoplasm of unspecified part of right bronchus or lung: Secondary | ICD-10-CM | POA: Diagnosis not present

## 2018-10-22 DIAGNOSIS — G893 Neoplasm related pain (acute) (chronic): Secondary | ICD-10-CM | POA: Diagnosis not present

## 2018-10-22 DIAGNOSIS — Z5111 Encounter for antineoplastic chemotherapy: Secondary | ICD-10-CM | POA: Diagnosis not present

## 2018-10-22 DIAGNOSIS — Z7901 Long term (current) use of anticoagulants: Secondary | ICD-10-CM | POA: Diagnosis not present

## 2018-10-23 ENCOUNTER — Emergency Department
Admission: EM | Admit: 2018-10-23 | Discharge: 2018-10-24 | Disposition: A | Discharge status: Home / Self Care | Payer: 59 | Attending: Emergency Medicine | Admitting: Emergency Medicine

## 2018-10-23 ENCOUNTER — Other Ambulatory Visit: Payer: Self-pay

## 2018-10-23 ENCOUNTER — Emergency Department: Payer: 59

## 2018-10-23 DIAGNOSIS — Z7901 Long term (current) use of anticoagulants: Secondary | ICD-10-CM | POA: Diagnosis not present

## 2018-10-23 DIAGNOSIS — C3490 Malignant neoplasm of unspecified part of unspecified bronchus or lung: Secondary | ICD-10-CM | POA: Diagnosis not present

## 2018-10-23 DIAGNOSIS — C7989 Secondary malignant neoplasm of other specified sites: Secondary | ICD-10-CM | POA: Diagnosis not present

## 2018-10-23 DIAGNOSIS — Z79899 Other long term (current) drug therapy: Secondary | ICD-10-CM | POA: Insufficient documentation

## 2018-10-23 DIAGNOSIS — Z5111 Encounter for antineoplastic chemotherapy: Secondary | ICD-10-CM | POA: Diagnosis not present

## 2018-10-23 DIAGNOSIS — R1013 Epigastric pain: Secondary | ICD-10-CM | POA: Diagnosis not present

## 2018-10-23 DIAGNOSIS — I10 Essential (primary) hypertension: Secondary | ICD-10-CM | POA: Insufficient documentation

## 2018-10-23 DIAGNOSIS — R079 Chest pain, unspecified: Secondary | ICD-10-CM | POA: Diagnosis not present

## 2018-10-23 DIAGNOSIS — Z87891 Personal history of nicotine dependence: Secondary | ICD-10-CM | POA: Diagnosis not present

## 2018-10-23 DIAGNOSIS — G893 Neoplasm related pain (acute) (chronic): Secondary | ICD-10-CM | POA: Insufficient documentation

## 2018-10-23 DIAGNOSIS — C799 Secondary malignant neoplasm of unspecified site: Secondary | ICD-10-CM

## 2018-10-23 DIAGNOSIS — C3492 Malignant neoplasm of unspecified part of left bronchus or lung: Secondary | ICD-10-CM | POA: Diagnosis not present

## 2018-10-23 LAB — CBC WITH DIFFERENTIAL/PLATELET
Abs Immature Granulocytes: 0.02 10*3/uL (ref 0.00–0.07)
Basophils Absolute: 0 10*3/uL (ref 0.0–0.1)
Basophils Relative: 0 %
Eosinophils Absolute: 0.1 10*3/uL (ref 0.0–0.5)
Eosinophils Relative: 2 %
HCT: 27.7 % — ABNORMAL LOW (ref 39.0–52.0)
Hemoglobin: 9.3 g/dL — ABNORMAL LOW (ref 13.0–17.0)
Immature Granulocytes: 0 %
Lymphocytes Relative: 7 %
Lymphs Abs: 0.5 10*3/uL — ABNORMAL LOW (ref 0.7–4.0)
MCH: 32 pg (ref 26.0–34.0)
MCHC: 33.6 g/dL (ref 30.0–36.0)
MCV: 95.2 fL (ref 80.0–100.0)
MONO ABS: 0.5 10*3/uL (ref 0.1–1.0)
Monocytes Relative: 8 %
Neutro Abs: 5.3 10*3/uL (ref 1.7–7.7)
Neutrophils Relative %: 83 %
Platelets: 211 10*3/uL (ref 150–400)
RBC: 2.91 MIL/uL — ABNORMAL LOW (ref 4.22–5.81)
RDW: 19.7 % — ABNORMAL HIGH (ref 11.5–15.5)
WBC: 6.4 10*3/uL (ref 4.0–10.5)
nRBC: 0 % (ref 0.0–0.2)

## 2018-10-23 MED ORDER — SODIUM CHLORIDE 0.9 % IV BOLUS
1000.0000 mL | Freq: Once | INTRAVENOUS | Status: AC
  Administered 2018-10-23: 1000 mL via INTRAVENOUS

## 2018-10-23 MED ORDER — HYDROMORPHONE HCL 1 MG/ML IJ SOLN
1.0000 mg | Freq: Once | INTRAMUSCULAR | Status: AC
  Administered 2018-10-23: 1 mg via INTRAVENOUS
  Filled 2018-10-23: qty 1

## 2018-10-23 MED ORDER — ONDANSETRON HCL 4 MG/2ML IJ SOLN
4.0000 mg | Freq: Once | INTRAMUSCULAR | Status: AC
  Administered 2018-10-23: 4 mg via INTRAVENOUS
  Filled 2018-10-23: qty 2

## 2018-10-23 NOTE — ED Provider Notes (Signed)
[facility] Emergency Department Provider Note ____________   None    (approximate)  I have reviewed the triage vital signs and the nursing notes.   HISTORY  Chief Complaint Chest Pain    HPI Ruben Eaton is a 48 y.o. male with below list of chronic medical conditions including large right pleural-based mass extending into the abdomen presents to the emergency department with epigastric abdominal pain which began tonight after eating.  Patient describes the pain as aching.  Patient states that he took approximately 10 mg of oral morphine at home without any.  Patient also admits to nausea.  Patient denies any fever.   Past Medical History:  Diagnosis Date  . Anorexia   . 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 09/15/2016 in setting of PFO.  . CVA (cerebral vascular accident) ([place]) 09/07/2016  . ED (erectile dysfunction)   . Essential (primary) hypertension 02/01/2016  . History of stroke 10/08/2016  . Hx of insomnia   . Hypertension   . OSA on CPAP   . PFO (patent foramen ovale)    a. s/p closure 09/2016 by Dr. [name].  . Pleural effusion on right 02/21/2018  . Primary cancer of right lower lobe of lung ([place]) 02/25/2018   Chemo tx's and rad tx's  . PVC's (premature ventricular contractions) 10/08/2016  . 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 TIA (transient ischemic attack) 09/07/2016    Patient Active Problem List   Diagnosis Date Noted  . Hypertension   . 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 Non-small cell cancer of left lung ([place]) 03/22/2018  . Anasarca 03/21/2018  . Acute respiratory failure with hypoxia ([place]) 03/21/2018  . Pure hypercholesterolemia 03/08/2018  . Goals of care, counseling/discussion 03/02/2018  . Anxiety 03/02/2018  . Primary cancer of right lower lobe of lung ([place]) 02/25/2018  . Pleural effusion 02/21/2018  . Vaccine counseling 09/01/2017  . OSA on CPAP 10/27/2016  . History of stroke 10/08/2016  . PVC's  (premature ventricular contractions) 10/08/2016  . PFO (patent foramen ovale) 10/07/2016  . TIA (transient ischemic attack) 09/07/2016  . CVA (cerebral vascular accident) ([place]) 09/07/2016  . 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 Essential (primary) hypertension 02/01/2016  . H/O disease 02/01/2016  . Quadriceps tendinitis 02/01/2016    Past Surgical History:  Procedure Laterality Date  . APPENDECTOMY  1980  . EYE SURGERY    . PATENT FORAMEN OVALE CLOSURE  10/07/2016  . PATENT FORAMEN OVALE CLOSURE N/A 10/07/2016   Procedure: Patent Forament [place](PFO) Closure;  Surgeon: [name], MD;  Location: [facility];  Service: Cardiovascular;  Laterality: N/A;  . PORTACATH PLACEMENT N/A 02/23/2018   Procedure: INSERTION PORT-A-CATH;  Surgeon: Nestor Lewandowsky, MD;  Location: [facility];  Service: Thoracic;  Laterality: N/A;  . STRABISMUS SURGERY Left 1984  . TEE WITHOUT CARDIOVERSION N/A 09/08/2016   Procedure: TRANSESOPHAGEAL ECHOCARDIOGRAM (TEE);  Surgeon: [name], MD;  Location: [facility];  Service: Cardiovascular;  Laterality: N/A;  . VIDEO ASSISTED THORACOSCOPY (VATS) W/TALC PLEUADESIS Right 02/23/2018   Procedure: VIDEO ASSISTED THORACOSCOPY (VATS) W/TALC PLEUADESIS;  Surgeon: Nestor Lewandowsky, MD;  Location: [facility];  Service: Thoracic;  Laterality: Right;  . VITRECTOMY Right 2013    Prior to Admission medications   Medication Sig Start Date End Date Taking? Authorizing Provider  acetaminophen (TYLENOL) 325 MG tablet Take 2 tablets (650 mg total) by mouth every 6 (six) hours as needed for mild pain (or Fever >/=  101). 03/27/18   Debbe Odea, MD  B Complex-C (B-COMPLEX WITH VITAMIN C) tablet Take 1 tablet by mouth daily.    [provider]  dicyclomine (BENTYL) 10 MG capsule Take 1 capsule (10 mg total) by mouth 4 (four) times daily -  before meals and at bedtime. 05/25/18   Cammie Sickle, MD  dronabinol (MARINOL) 5 MG capsule Take 1 capsule  (5 mg total) by mouth 2 (two) times daily before a meal. 09/21/18   Verlon Au, NP  furosemide (LASIX) 20 MG tablet Take 1 tablet (20 mg total) by mouth daily as needed for edema. 06/18/18 09/21/23  Leanor Kail, PA  ibuprofen (ADVIL,MOTRIN) 200 MG tablet Take 800 mg by mouth daily as needed for mild pain.    [provider]  lidocaine-prilocaine (EMLA) cream Apply 1 application topically as needed. Apply generously over the Mediport 45 minutes prior to chemotherapy. 05/11/18   Cammie Sickle, MD  loratadine (CLARITIN) 10 MG tablet Take 10 mg by mouth daily as needed for allergies.     [provider]  LORazepam (ATIVAN) 0.5 MG tablet Take 1 tablet (0.5 mg total) by mouth every 8 (eight) hours as needed for anxiety. 09/12/18   Cammie Sickle, MD  metoprolol tartrate (LOPRESSOR) 25 MG tablet Take 1 tablet (25 mg total) by mouth 2 (two) times daily. 02/27/18   Dustin Flock, MD  morphine (MS CONTIN) 15 MG 12 hr tablet Take 1 tablet (15 mg total) by mouth as needed for pain. 10/11/18   Verlon Au, NP  Morphine Sulfate (MORPHINE CONCENTRATE) 10 mg / 0.5 ml concentrated solution Take 0.5 mLs (10 mg total) by mouth every 6 (six) hours as needed for severe pain. 10/11/18   Verlon Au, NP  Multiple Vitamin (MULTIVITAMIN WITH MINERALS) TABS tablet Take 1 tablet by mouth daily.    [provider]  oxyCODONE-acetaminophen (PERCOCET) 5-325 MG tablet Take 1 tablet by mouth every 12 (twelve) hours as needed for severe pain. 09/28/18   Verlon Au, NP  prochlorperazine (COMPAZINE) 10 MG tablet Take 1 tablet (10 mg total) by mouth every 6 (six) hours as needed (Nausea or vomiting). 07/20/18   Cammie Sickle, MD  rivaroxaban (XARELTO) 20 MG TABS tablet Take 1 tablet (20 mg total) by mouth daily with supper. 09/28/18   Cammie Sickle, MD  tadalafil (CIALIS) 5 MG tablet Take 5 mg by mouth daily as needed for erectile dysfunction.    [provider]  triamcinolone (NASACORT) 55 MCG/ACT AERO nasal inhaler Place 2 sprays into the nose daily as needed (congestion).     [provider]  Vitamin D, Ergocalciferol, (DRISDOL) 50000 units CAPS capsule Take 1 capsule (50,000 Units total) by mouth every 7 (seven) days. Patient taking differently: Take 50,000 Units by mouth every 7 (seven) days. Mondays 05/05/16   Lyndal Pulley, DO  zolpidem (AMBIEN) 10 MG tablet Take 10 mg by mouth at bedtime as needed for sleep.  12/10/15   [provider]    Allergies Patient has no known allergies.  Family History  Problem Relation Age of Onset  . Other Paternal Uncle        Abdominal tumor     Social History Social History   Tobacco Use  . Smoking status: Former Smoker    Packs/day: 0.50    Years: 10.00    Pack years: 5.00    Last attempt to quit: 2011    Years since quitting:  9.0  . Smokeless tobacco: Former Systems developer    Types: Snuff  Substance Use Topics  . Alcohol use: Yes    Alcohol/week: 3.0 standard drinks    Types: 3 Cans of beer per week  . Drug use: No    Review of Systems Constitutional: No fever/chills Eyes: No visual changes. ENT: No sore throat. Cardiovascular: Denies chest pain. Respiratory: Denies shortness of breath. Gastrointestinal: Positive for abdominal pain and nausea, no vomiting.  No diarrhea.  No constipation. Genitourinary: Negative for dysuria. Musculoskeletal: Negative for neck pain.  Negative for back pain. Integumentary: Negative for rash. Neurological: Negative for headaches, focal weakness or numbness.  ____________________________________________   PHYSICAL EXAM:  VITAL SIGNS: ED Triage Vitals [10/23/18 2323]  Enc Vitals Group     BP 100/87     Pulse Rate 83     Resp 20     Temp 97.8 F (36.6 C)     Temp Source Oral     SpO2 100 %     Weight 59.9 kg (132 lb)     Height      Head Circumference      Peak Flow      Pain Score 7     Pain Loc      Pain Edu?       Excl. in [place]?     Constitutional: Alert and oriented. Well appearing and in no acute distress. Eyes: Conjunctivae are normal.  Mouth/Throat: Mucous membranes are moist.  Oropharynx non-erythematous. Neck: No stridor.   Cardiovascular: Normal rate, regular rhythm. Good peripheral circulation. Grossly normal heart sounds. Respiratory: Normal respiratory effort.  No retractions. Lungs CTAB. Gastrointestinal: Positive for epigastric abdominal pain with palpation.  No distention.  Musculoskeletal: No lower extremity tenderness nor edema. No gross deformities of extremities. Neurologic:  Normal speech and language. No gross focal neurologic deficits are appreciated.  Skin:  Skin is warm, dry and intact. No rash noted.  ____________________________________________   LABS (all labs ordered are listed, but only abnormal results are displayed)  Labs Reviewed  CBC WITH DIFFERENTIAL/PLATELET - Abnormal; Notable for the following components:      Result Value   RBC 2.91 (*)    Hemoglobin 9.3 (*)    HCT 27.7 (*)    RDW 19.7 (*)    Lymphs Abs 0.5 (*)    All other components within normal limits  COMPREHENSIVE METABOLIC PANEL - Abnormal; Notable for the following components:   Sodium 134 (*)    Glucose, Bld 109 (*)    BUN 26 (*)    Calcium 8.7 (*)    Total Protein 6.4 (*)    Albumin 2.7 (*)    All other components within normal limits  LIPASE, BLOOD   ____________________________________________  EKG  ED ECG REPORT I, [place] Saachi Zale, the attending physician, personally viewed and interpreted this ECG.   Date: 10/23/2018  EKG Time: 11:19 PM  Rate: 86  Rhythm: Sinus rhythm  Axis: Normal  Intervals: Normal  ST&T Change: None  ____________________________________________  RADIOLOGY I, [place] N Antoinette Borgwardt, personally viewed and evaluated these images (plain radiographs) as part of my medical decision making, as well as reviewing the written report by the radiologist.  ED MD  interpretation: No acute abnormality noted on CT abdomen or pelvis.  Known pleural-based mass throughout the right hemithorax causing mass-effect in mediastinum and right upper quadrant not significantly changed compared to CT 10 days ago per radiologist.  Official radiology report(s): Ct Abdomen Pelvis W Contrast  Result  Date: 10/24/2018 CLINICAL DATA:  Acute abdominal pain. Patient reports chest pain. Lung cancer with active chemotherapy. EXAM: CT ABDOMEN AND PELVIS WITH CONTRAST TECHNIQUE: Multidetector CT imaging of the abdomen and pelvis was performed using the standard protocol following bolus administration of intravenous contrast. CONTRAST:  144mL ISOVUE-300 IOPAMIDOL (ISOVUE-300) INJECTION 61% COMPARISON:  Most recent comparison CT 12 days prior 10/12/2018 FINDINGS: Lower chest: Heterogeneous soft tissue pleural-based masses in the right lower hemithorax extends to the subcarinal region in causes anterior displacement of the heart and mediastinum. Associated posterior displacement of the descending aorta. Esophagus is difficult to delineate from the large lesions. Pleural lesions extend inferiorly deep in the pleural space and efface the right hepatic lobe as before. Hepatobiliary: Mass-effect in the right hepatic lobe by pleural lesions. No discrete focal hepatic abnormality. Gallbladder physiologically distended, no calcified stone. No biliary dilatation. Pancreas: No ductal dilatation or inflammation. Spleen: Normal in size without focal abnormality. Adrenals/Urinary Tract: No adrenal nodule. Right kidney displaced anteriorly by right pleural based mass. No hydronephrosis. Left kidney is unremarkable. Homogeneous renal enhancement with symmetric excretion on delayed phase imaging. Urinary bladder is physiologically distended without wall thickening. Stomach/Bowel: Bowel evaluation is limited in the absence of enteric contrast and paucity of intra-abdominal fat. No evidence of bowel obstruction or  inflammation. Appendix not well visualized. Moderate colonic stool burden with stool distending the rectum. Vascular/Lymphatic: Normal caliber abdominal aorta. Portal vein and mesenteric vessels are patent. No bulky adenopathy, however lack of enteric contrast limits detailed assessment. Reproductive: Prostate is unremarkable. Other: Trace free fluid in the pelvis, nonspecific. No free air. No evidence of intra-abdominal abscess. Musculoskeletal: Soft tissue edema in the right flank subcutaneous tissues with thickening of the posterolateral abdominal wall musculature, likely secondary to tumor invasion by pleural based lesions. No focal osseous lesion. IMPRESSION: 1. No acute abnormality in the abdomen/pelvis. 2. Known pleural-based masses throughout the right hemithorax causing mass effect on the mediastinum and right upper quadrant, not significantly changed from CT 12 days ago. 3. Soft tissue edema in the right flank subcutaneous tissues with thickening of the posterolateral abdominal wall musculature, may be reactive secondary to chest/abdominal wall invasion by pleural based lesions. Electronically Signed   By: Keith Rake M.D.   On: 10/24/2018 00:51   Dg Chest Portable 1 View  Result Date: 10/24/2018 CLINICAL DATA:  Chest pain, abdominal pain.  Lung cancer. EXAM: PORTABLE CHEST 1 VIEW COMPARISON:  Chest CT 10/09/2018 FINDINGS: Right Port-A-Cath remains in place, unchanged. Severe right pleural thickening and right lung airspace disease again noted as seen on recent chest CT, not significantly changed. Left lung clear. Heart is normal size. No acute bony abnormality. IMPRESSION: Right pleural thickening and diffuse right lung airspace disease, stable since recent CT. Electronically Signed   By: Rolm Baptise M.D.   On: 10/24/2018 00:00    Procedures   ____________________________________________   INITIAL IMPRESSION / ASSESSMENT AND PLAN / ED COURSE  As part of my medical decision making, I  reviewed the following data within the electronic MEDICAL RECORD NUMBER   48 year old male presenting with above-stated history and physical exam secondary to epigastric abdominal discomfort.  Considered possibility of pancreatitis biliary obstruction secondary to known metastatic mass versus other potential intra-abdominal pathology.  Laboratory data unremarkable consistent with previous laboratory data.  CT revealed no acute pathology.  Given the fact that the patient's pain started after eating considered possibility of mass-effect secondary to known tumor.  Patient was given in total 2 mg of IV Dilaudid  in 1 mg increments with current pain score 2 out of 10 which patient states is his baseline pain. ____________________________________________  FINAL CLINICAL IMPRESSION(S) / ED DIAGNOSES  Final diagnoses:  Chronic pain not due to malignancy  Pain of metastatic malignancy     MEDICATIONS GIVEN DURING THIS VISIT:  Medications  HYDROmorphone (DILAUDID) injection 1 mg (1 mg Intravenous Given 10/23/18 2350)  ondansetron (ZOFRAN) injection 4 mg (4 mg Intravenous Given 10/23/18 2350)  sodium chloride 0.9 % bolus 1,000 mL (0 mLs Intravenous Stopped 10/24/18 0112)  iopamidol (ISOVUE-300) 61 % injection 100 mL (100 mLs Intravenous Contrast Given 10/24/18 0018)  HYDROmorphone (DILAUDID) injection 1 mg (1 mg Intravenous Given 10/24/18 0032)     ED Discharge Orders    None       Note:  This document was prepared using Dragon voice recognition software and may include unintentional dictation errors.    Gregor Hams, MD 10/24/18 [id number]

## 2018-10-23 NOTE — ED Triage Notes (Signed)
Pt in with co chest pain states started as abd pain and got worse after eating. States now radiates to chest and worse when he lays down. Pt is on chemo for lung cancer.

## 2018-10-24 ENCOUNTER — Encounter: Payer: Self-pay | Admitting: Internal Medicine

## 2018-10-24 ENCOUNTER — Emergency Department: Payer: 59

## 2018-10-24 ENCOUNTER — Encounter: Payer: Self-pay | Admitting: Radiology

## 2018-10-24 DIAGNOSIS — Z79899 Other long term (current) drug therapy: Secondary | ICD-10-CM | POA: Diagnosis not present

## 2018-10-24 DIAGNOSIS — C3490 Malignant neoplasm of unspecified part of unspecified bronchus or lung: Secondary | ICD-10-CM | POA: Diagnosis not present

## 2018-10-24 DIAGNOSIS — I10 Essential (primary) hypertension: Secondary | ICD-10-CM | POA: Diagnosis not present

## 2018-10-24 DIAGNOSIS — Z87891 Personal history of nicotine dependence: Secondary | ICD-10-CM | POA: Diagnosis not present

## 2018-10-24 DIAGNOSIS — R1013 Epigastric pain: Secondary | ICD-10-CM | POA: Diagnosis not present

## 2018-10-24 DIAGNOSIS — Z7901 Long term (current) use of anticoagulants: Secondary | ICD-10-CM | POA: Diagnosis not present

## 2018-10-24 DIAGNOSIS — Z5111 Encounter for antineoplastic chemotherapy: Secondary | ICD-10-CM | POA: Diagnosis not present

## 2018-10-24 DIAGNOSIS — G893 Neoplasm related pain (acute) (chronic): Secondary | ICD-10-CM | POA: Diagnosis not present

## 2018-10-24 LAB — COMPREHENSIVE METABOLIC PANEL
ALT: 25 U/L (ref 0–44)
AST: 21 U/L (ref 15–41)
Albumin: 2.7 g/dL — ABNORMAL LOW (ref 3.5–5.0)
Alkaline Phosphatase: 92 U/L (ref 38–126)
Anion gap: 7 (ref 5–15)
BUN: 26 mg/dL — ABNORMAL HIGH (ref 6–20)
CO2: 25 mmol/L (ref 22–32)
Calcium: 8.7 mg/dL — ABNORMAL LOW (ref 8.9–10.3)
Chloride: 102 mmol/L (ref 98–111)
Creatinine, Ser: 0.67 mg/dL (ref 0.61–1.24)
GFR calc Af Amer: >60 mL/min (ref >60)
GFR calc non Af Amer: >60 mL/min (ref >60)
Glucose, Bld: 109 mg/dL — ABNORMAL HIGH (ref 70–99)
Potassium: 3.8 mmol/L (ref 3.5–5.1)
Sodium: 134 mmol/L — ABNORMAL LOW (ref 135–145)
Total Bilirubin: 0.5 mg/dL (ref 0.3–1.2)
Total Protein: 6.4 g/dL — ABNORMAL LOW (ref 6.5–8.1)

## 2018-10-24 LAB — LIPASE, BLOOD: Lipase: 19 U/L (ref 11–51)

## 2018-10-24 MED ORDER — IOPAMIDOL (ISOVUE-300) INJECTION 61%
100.0000 mL | Freq: Once | INTRAVENOUS | Status: AC | PRN
  Administered 2018-10-24: 100 mL via INTRAVENOUS

## 2018-10-24 MED ORDER — HYDROMORPHONE HCL 1 MG/ML IJ SOLN
1.0000 mg | Freq: Once | INTRAMUSCULAR | Status: AC
  Administered 2018-10-24: 1 mg via INTRAVENOUS
  Filled 2018-10-24: qty 1

## 2018-10-24 NOTE — ED Notes (Signed)
Pt verbalized understanding of d/c instructions and f/u care. No further questions at this time. Pt assisted to exit via wheelchair by RN.

## 2018-10-24 NOTE — ED Notes (Signed)
Provider at bedside at this time

## 2018-10-25 ENCOUNTER — Other Ambulatory Visit: Payer: Self-pay | Admitting: Internal Medicine

## 2018-10-25 ENCOUNTER — Other Ambulatory Visit: Payer: Self-pay | Admitting: Nurse Practitioner

## 2018-10-26 ENCOUNTER — Telehealth: Payer: Self-pay | Admitting: *Deleted

## 2018-10-26 MED ORDER — LORAZEPAM 0.5 MG PO TABS: 0.5000 mg | ORAL_TABLET | Freq: Three times a day (TID) | ORAL | 0 refills | Status: AC | PRN

## 2018-10-26 MED ORDER — OXYCODONE-ACETAMINOPHEN 5-325 MG PO TABS: 1.0000 | ORAL_TABLET | ORAL | 0 refills | Status: DC | PRN

## 2018-10-26 NOTE — Telephone Encounter (Signed)
Ruben Eaton, we will make a referral to [facility].  [place], Please schedule this apt-coordinate with patient's next md visit with Dr. Jacinto Reap

## 2018-10-26 NOTE — Telephone Encounter (Signed)
Patient wife called requesting Palliative care services for patient. Please send referral if in agreement

## 2018-10-26 NOTE — Telephone Encounter (Signed)
Adonis Brook informed of doctor response

## 2018-11-01 ENCOUNTER — Inpatient Hospital Stay (HOSPITAL_BASED_OUTPATIENT_CLINIC_OR_DEPARTMENT_OTHER): Payer: 59 | Admitting: Internal Medicine

## 2018-11-01 ENCOUNTER — Inpatient Hospital Stay: Payer: 59 | Attending: Internal Medicine

## 2018-11-01 ENCOUNTER — Inpatient Hospital Stay (HOSPITAL_BASED_OUTPATIENT_CLINIC_OR_DEPARTMENT_OTHER): Payer: 59 | Admitting: Hospice and Palliative Medicine

## 2018-11-01 ENCOUNTER — Telehealth: Payer: Self-pay | Admitting: Internal Medicine

## 2018-11-01 ENCOUNTER — Inpatient Hospital Stay: Payer: 59

## 2018-11-01 ENCOUNTER — Other Ambulatory Visit: Payer: Self-pay

## 2018-11-01 DIAGNOSIS — K5900 Constipation, unspecified: Secondary | ICD-10-CM | POA: Diagnosis not present

## 2018-11-01 DIAGNOSIS — Z791 Long term (current) use of non-steroidal anti-inflammatories (NSAID): Secondary | ICD-10-CM | POA: Insufficient documentation

## 2018-11-01 DIAGNOSIS — R630 Anorexia: Secondary | ICD-10-CM | POA: Insufficient documentation

## 2018-11-01 DIAGNOSIS — Z7901 Long term (current) use of anticoagulants: Secondary | ICD-10-CM

## 2018-11-01 DIAGNOSIS — Z79899 Other long term (current) drug therapy: Secondary | ICD-10-CM | POA: Insufficient documentation

## 2018-11-01 DIAGNOSIS — Z5111 Encounter for antineoplastic chemotherapy: Secondary | ICD-10-CM | POA: Insufficient documentation

## 2018-11-01 DIAGNOSIS — Z5112 Encounter for antineoplastic immunotherapy: Secondary | ICD-10-CM | POA: Insufficient documentation

## 2018-11-01 DIAGNOSIS — Z9989 Dependence on other enabling machines and devices: Secondary | ICD-10-CM | POA: Diagnosis not present

## 2018-11-01 DIAGNOSIS — G4733 Obstructive sleep apnea (adult) (pediatric): Secondary | ICD-10-CM | POA: Diagnosis not present

## 2018-11-01 DIAGNOSIS — R64 Cachexia: Secondary | ICD-10-CM | POA: Diagnosis not present

## 2018-11-01 DIAGNOSIS — Z515 Encounter for palliative care: Secondary | ICD-10-CM | POA: Insufficient documentation

## 2018-11-01 DIAGNOSIS — M549 Dorsalgia, unspecified: Secondary | ICD-10-CM | POA: Diagnosis not present

## 2018-11-01 DIAGNOSIS — I10 Essential (primary) hypertension: Secondary | ICD-10-CM

## 2018-11-01 DIAGNOSIS — Z66 Do not resuscitate: Secondary | ICD-10-CM | POA: Insufficient documentation

## 2018-11-01 DIAGNOSIS — G893 Neoplasm related pain (acute) (chronic): Secondary | ICD-10-CM | POA: Diagnosis not present

## 2018-11-01 DIAGNOSIS — Z7689 Persons encountering health services in other specified circumstances: Secondary | ICD-10-CM | POA: Insufficient documentation

## 2018-11-01 DIAGNOSIS — F329 Major depressive disorder, single episode, unspecified: Secondary | ICD-10-CM | POA: Diagnosis not present

## 2018-11-01 DIAGNOSIS — R7989 Other specified abnormal findings of blood chemistry: Secondary | ICD-10-CM | POA: Diagnosis not present

## 2018-11-01 DIAGNOSIS — Z7189 Other specified counseling: Secondary | ICD-10-CM

## 2018-11-01 DIAGNOSIS — C3431 Malignant neoplasm of lower lobe, right bronchus or lung: Secondary | ICD-10-CM | POA: Diagnosis not present

## 2018-11-01 DIAGNOSIS — Z8673 Personal history of transient ischemic attack (TIA), and cerebral infarction without residual deficits: Secondary | ICD-10-CM | POA: Insufficient documentation

## 2018-11-01 DIAGNOSIS — R9389 Abnormal findings on diagnostic imaging of other specified body structures: Secondary | ICD-10-CM

## 2018-11-01 DIAGNOSIS — Z87891 Personal history of nicotine dependence: Secondary | ICD-10-CM | POA: Diagnosis not present

## 2018-11-01 DIAGNOSIS — R05 Cough: Secondary | ICD-10-CM | POA: Insufficient documentation

## 2018-11-01 DIAGNOSIS — R5383 Other fatigue: Secondary | ICD-10-CM

## 2018-11-01 DIAGNOSIS — E46 Unspecified protein-calorie malnutrition: Secondary | ICD-10-CM

## 2018-11-01 DIAGNOSIS — Z923 Personal history of irradiation: Secondary | ICD-10-CM | POA: Insufficient documentation

## 2018-11-01 DIAGNOSIS — R5381 Other malaise: Secondary | ICD-10-CM

## 2018-11-01 LAB — COMPREHENSIVE METABOLIC PANEL
ALT: 32 U/L (ref 0–44)
AST: 29 U/L (ref 15–41)
Albumin: 2.6 g/dL — ABNORMAL LOW (ref 3.5–5.0)
Alkaline Phosphatase: 103 U/L (ref 38–126)
Anion gap: 7 (ref 5–15)
BUN: 19 mg/dL (ref 6–20)
CO2: 24 mmol/L (ref 22–32)
Calcium: 8.7 mg/dL — ABNORMAL LOW (ref 8.9–10.3)
Chloride: 104 mmol/L (ref 98–111)
Creatinine, Ser: 0.77 mg/dL (ref 0.61–1.24)
GFR calc non Af Amer: >60 mL/min (ref >60)
GLUCOSE: 99 mg/dL (ref 70–99)
Potassium: 3.4 mmol/L — ABNORMAL LOW (ref 3.5–5.1)
SODIUM: 135 mmol/L (ref 135–145)
Total Bilirubin: 0.4 mg/dL (ref 0.3–1.2)
Total Protein: 6.2 g/dL — ABNORMAL LOW (ref 6.5–8.1)

## 2018-11-01 LAB — CBC WITH DIFFERENTIAL/PLATELET
Abs Immature Granulocytes: 0.02 10*3/uL (ref 0.00–0.07)
Basophils Absolute: 0 10*3/uL (ref 0.0–0.1)
Basophils Relative: 0 %
Eosinophils Absolute: 0.2 10*3/uL (ref 0.0–0.5)
Eosinophils Relative: 3 %
HEMATOCRIT: 26.4 % — AB (ref 39.0–52.0)
Hemoglobin: 8.6 g/dL — ABNORMAL LOW (ref 13.0–17.0)
Immature Granulocytes: 0 %
Lymphocytes Relative: 9 %
Lymphs Abs: 0.5 10*3/uL — ABNORMAL LOW (ref 0.7–4.0)
MCH: 32.3 pg (ref 26.0–34.0)
MCHC: 32.6 g/dL (ref 30.0–36.0)
MCV: 99.2 fL (ref 80.0–100.0)
MONOS PCT: 12 %
Monocytes Absolute: 0.7 10*3/uL (ref 0.1–1.0)
Neutro Abs: 4.3 10*3/uL (ref 1.7–7.7)
Neutrophils Relative %: 76 %
Platelets: 285 10*3/uL (ref 150–400)
RBC: 2.66 MIL/uL — ABNORMAL LOW (ref 4.22–5.81)
RDW: 18.5 % — AB (ref 11.5–15.5)
WBC: 5.8 10*3/uL (ref 4.0–10.5)
nRBC: 0 % (ref 0.0–0.2)

## 2018-11-01 LAB — URINALYSIS, COMPLETE (UACMP) WITH MICROSCOPIC
Bacteria, UA: NONE SEEN
Bilirubin Urine: NEGATIVE
Glucose, UA: NEGATIVE mg/dL
Hgb urine dipstick: NEGATIVE
Ketones, ur: 5 mg/dL — AB
Leukocytes, UA: NEGATIVE
Nitrite: NEGATIVE
Protein, ur: NEGATIVE mg/dL
Specific Gravity, Urine: 1.03 (ref 1.005–1.030)
pH: 5 (ref 5.0–8.0)

## 2018-11-01 MED ORDER — DEXAMETHASONE 4 MG PO TABS: ORAL_TABLET | ORAL | 3 refills | Status: AC

## 2018-11-01 MED ORDER — SODIUM CHLORIDE 0.9 % IV SOLN
900.0000 mg | INTRAVENOUS | Status: DC
  Administered 2018-11-01: 900 mg via INTRAVENOUS
  Filled 2018-11-01: qty 36

## 2018-11-01 MED ORDER — SODIUM CHLORIDE 0.9% FLUSH
10.0000 mL | INTRAVENOUS | Status: DC | PRN
  Administered 2018-11-01: 10 mL
  Filled 2018-11-01: qty 10

## 2018-11-01 MED ORDER — SODIUM CHLORIDE 0.9 % IV SOLN
Freq: Once | INTRAVENOUS | Status: AC
  Administered 2018-11-01: 11:00:00 via INTRAVENOUS
  Filled 2018-11-01: qty 250

## 2018-11-01 MED ORDER — DULOXETINE HCL 30 MG PO CPEP: 30.0000 mg | ORAL_CAPSULE | Freq: Every day | ORAL | 3 refills | Status: AC

## 2018-11-01 MED ORDER — HEPARIN SOD (PORK) LOCK FLUSH 100 UNIT/ML IV SOLN
500.0000 [IU] | Freq: Once | INTRAVENOUS | Status: AC | PRN
  Administered 2018-11-01: 500 [IU]
  Filled 2018-11-01: qty 5

## 2018-11-01 MED ORDER — SODIUM CHLORIDE 0.9 % IV SOLN
1200.0000 mg | Freq: Once | INTRAVENOUS | Status: AC
  Administered 2018-11-01: 1200 mg via INTRAVENOUS
  Filled 2018-11-01: qty 20

## 2018-11-01 NOTE — Progress Notes (Signed)
DISCONTINUE ON PATHWAY REGIMEN - Non-Small Cell Lung     A cycle is every 21 days:     Paclitaxel      Carboplatin      Bevacizumab   **Always confirm dose/schedule in your pharmacy ordering system**  REASON: Disease Progression PRIOR TREATMENT: [id number]: Carboplatin AUC=6 + Paclitaxel 200 mg/m2 + Bevacizumab 15 mg/kg q21 Days x 4-6 Cycles TREATMENT RESPONSE: Progressive Disease (PD)  START ON PATHWAY REGIMEN - Non-Small Cell Lung     A cycle is every 21 days:     Ramucirumab      Docetaxel   **Always confirm dose/schedule in your pharmacy ordering system**  Patient Characteristics: Stage IV Metastatic, Nonsquamous, Third Line - Chemotherapy/Immunotherapy, PS = 0, 1, Prior PD-1/PD-L1 Inhibitor or No Prior PD-1/PD-L1 Inhibitor and Not a Candidate for Immunotherapy AJCC T Category: T4 Current Disease Status: Distant Metastases AJCC N Category: N0 AJCC M Category: M1 AJCC 8 Stage Grouping: IV Histology: Nonsquamous Cell ROS1 Rearrangement Status: Negative T790M Mutation Status: Not Applicable - EGFR Mutation Negative/Unknown Other Mutations/Biomarkers: No Other Actionable Mutations NTRK Gene Fusion Status: Negative PD-L1 Expression Status: PD-L1 Negative Chemotherapy/Immunotherapy LOT: Third Line Chemotherapy/Immunotherapy Molecular Targeted Therapy: Not Appropriate ALK Translocation Status: Negative EGFR Mutation Status: Sensitizing Common BRAF V600E Mutation Status: Negative Performance Status: PS = 0, 1 Immunotherapy Candidate Status: Not a Candidate for Immunotherapy Prior Immunotherapy Status: Prior PD-1/PD-L1 Inhibitor Intent of Therapy: Non-Curative / Palliative Intent, Discussed with Patient

## 2018-11-01 NOTE — Assessment & Plan Note (Addendum)
#  Non-small cell lung cancer/stage IV-October 09, 2018 CT scan-slight improvement of the right lung malignancy; however slightly increased infiltrative changes noted in the right upper lung.  Currently on Tecentriq-Avastin maintenance.  However. clinically suspicious of progressive disease-increasing right posterior pain-lump/worsening " chest pain"; as noted on the recent January 2020 CT scan.  # Proceed with Tecentriq Avastin today. CBC CMP are reviewed; adequate today; no contraindications to chemotherapy. Proceed with treatment today.   #Had a long discussion the patient and his wife regarding regarding the serious diagnosis/overall poor prognosis given his rapidly progressive disease on multiple chemotherapy.  As patient is symptomatic I would recommend switching therapy to [place].  Discussed the potential side effects including but not limited to-increasing fatigue, nausea vomiting, diarrhea, hair loss, sores in the mouth, increase risk of infection and also neuropathy.  Also discussed anti-VEGF side effects including but not limited proteinuria risk of stroke.  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  Currently on MS Contin/Percocet/morphine elixir.  Will await discussion with Billey Chang, palliative care.  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  #Malnutrition Albumin-2.8 stable.  # DISPOSITION # Treatment today- Tecen- Avastin.  # follow up in 3 weeks-MD;labs-cbc/cmp/UA -Taxoeter-Ram-Dr.B  # 40 minutes face-to-face with the patient discussing the above plan of care; more than 50% of time spent on prognosis/ natural history; counseling and coordination.

## 2018-11-01 NOTE — Progress Notes (Signed)
[facility] OFFICE PROGRESS NOTE  Patient Care Team: Dion Body, MD as PCP - General (Family Medicine) [name], MD as PCP - Cardiology (Cardiology) Telford Nab, RN as Registered Nurse  Cancer Staging Primary cancer of right lower lobe of lung [facility]) Staging form: Lung, AJCC 8th Edition - Clinical stage from 03/02/2018: Stage IV (cT4, cN0, pM1a) - Unsigned    Oncology History   # June 2019- LUNG CA- HIGH GRADE CA with sarcomatoid features [EGFR MUTATED; exon 19; clinically non-small cell];Dr.Jeffrey Carlis Abbott; II opinion at [facility];  # June 17th- carbo-taxol #1; PROGRESSION June 26th 2019- Osimertinib 7m/day; [facility] reirradiation right chest wall finished radiation December 10th [Dr.Kinard]  # Oct 2019- Severe progression in chest; START carbo-Taxol-Tecentriq-Avastin x4 cycles; 10/09/2018- PR/?  Increasing infiltrate right upper lobe [?mixed response]; 1/16-Tecen-Avastin  # Repeat chest Bx- Nov 2019- high grade Non-small cell with sarcomatoid features  # Dec 3rd 2019- Left Lung PE- lovenox  # HTN/ cryptogenic stroke;  # MOLECULAR TESTING- EGFR exon 19 deletion [omniseq]; Oct 2019- Plasma/guadant tetsing- EGFR exon 19del*   DIAGNOSIS: non-small cell lung ca  STAGE:  IV  ;GOALS: palliative  CURRENT/MOST RECENT THERAPY- Carbo-Taxol-Avastin-Tecentriq      Primary cancer of right lower lobe of lung ([place]   11/22/2018 -  Chemotherapy    The patient had pegfilgrastim-cbqv (UDENYCA) injection 6 mg, 6 mg, Subcutaneous, Once, 0 of 6 cycles DOCEtaxel (TAXOTERE) 130 mg in sodium chloride 0.9 % 250 mL chemo infusion, 75 mg/m2, Intravenous,  Once, 0 of 6 cycles ramucirumab (CYRAMZA) 600 mg in sodium chloride 0.9 % 190 mL chemo infusion, 10 mg/kg, Intravenous, Once, 0 of 6 cycles  for chemotherapy treatment.        INTERVAL HISTORY:  MBRAYSEN [id number].  male pleasant patient above history of metastatic  adenocarcinoma the lung/EGFR mutated is currently Tecentriq-Avastin maintenance is here for follow-up.  In the interim patient was evaluated by a CT scan in the emergency room given his left posterior chest wall pain.  CT scan kidney protocol showed large soft tissue mass pushing the kidney; patient was again seen in the emergency room few days ago for "chest pain"-which again showed progressive disease in the chest/with mediastinal shift.  Patient is currently taking liquid morphine/Percocet and MS Contin.  Patient noted to have better pain control with Dilaudid as needed/dexamethasone as needed.  Patient Complains of Fatigue-appetite fair at best.  Shortness of breath is stable.  No headaches.  In the interim patient was evaluated by [facility] for possible clinical trials/phase 1.  Review of Systems  Constitutional: Positive for malaise/fatigue. Negative for chills, diaphoresis, fever and weight loss.  HENT: Negative for nosebleeds and sore throat.   Eyes: Negative for double vision.  Respiratory: Positive for cough and shortness of breath. Negative for hemoptysis, sputum production and wheezing.   Cardiovascular: Positive for chest pain (Right chest wall pain.). Negative for palpitations, orthopnea and leg swelling.  Gastrointestinal: Negative for blood in stool, constipation, heartburn, melena, nausea and vomiting.  Genitourinary: Positive for flank pain. Negative for dysuria, frequency and urgency.  Musculoskeletal: Positive for back pain and joint pain.  Skin: Negative.  Negative for itching and rash.  Neurological: Positive for tingling. Negative for dizziness, focal weakness, weakness and headaches.  Endo/Heme/Allergies: Does not bruise/bleed easily.  Psychiatric/Behavioral: Negative for depression. The patient is not nervous/anxious and does not have insomnia.       PAST MEDICAL HISTORY :  Past Medical  History:  Diagnosis Date  . Anorexia   . Cryptogenic stroke ([place]) 08/2016   a.  09/15/2016 in setting of PFO.  . CVA (cerebral vascular accident) ([place]) 09/07/2016  . ED (erectile dysfunction)   . Essential (primary) hypertension 02/01/2016  . History of stroke 10/08/2016  . Hx of insomnia   . Hypertension   . OSA on CPAP   . PFO (patent foramen ovale)    a. s/p closure 09/2016 by Dr. [name].  . Pleural effusion on right 02/21/2018  . Primary cancer of right lower lobe of lung ([place]) 02/25/2018   Chemo tx's and rad tx's  . PVC's (premature ventricular contractions) 10/08/2016  . 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 TIA (transient ischemic attack) 09/07/2016    PAST SURGICAL HISTORY :   Past Surgical History:  Procedure Laterality Date  . APPENDECTOMY  1980  . EYE SURGERY    . PATENT FORAMEN OVALE CLOSURE  10/07/2016  . PATENT FORAMEN OVALE CLOSURE N/A 10/07/2016   Procedure: Patent For[place](PFO) Closure;  Surgeon: [name], MD;  Location: [facility];  Service: Cardiovascular;  Laterality: N/A;  . PORTACATH PLACEMENT N/A 02/23/2018   Procedure: INSERTION PORT-A-CATH;  Surgeon: Nestor Lewandowsky, MD;  Location: [facility] ORS;  Service: Thoracic;  Laterality: N/A;  . STRABISMUS SURGERY Left 1984  . TEE WITHOUT CARDIOVERSION N/A 09/08/2016   Procedure: TRANSESOPHAGEAL ECHOCARDIOGRAM (TEE);  Surgeon: [name], MD;  Location: [facility] OR[facility];  Service: Cardiovascular;  Laterality: N/A;  . VIDEO ASSISTED THORACOSCOPY (VATS) W/TALC PLEUADESIS Right 02/23/2018   Procedure: VIDEO ASSISTED THORACOSCOPY (VATS) W/TALC PLEUADESIS;  Surgeon: Nestor Lewandowsky, MD;  Location: [facility] ORS;  Service: Thoracic;  Laterality: Right;  . VITRECTOMY Right 2013    FAMILY HISTORY :   Family History  Problem Relation Age of Onset  . Other Paternal Uncle        Abdominal tumor     SOCIAL HISTORY:   Social History   Tobacco Use  . Smoking status: Former Smoker    Packs/day: 0.50    Years: 10.00    Pack years: 5.00    Last attempt to quit: 2011    Years since  quitting: 9.1  . Smokeless tobacco: Former Systems developer    Types: Snuff  Substance Use Topics  . Alcohol use: Yes    Alcohol/week: 3.0 standard drinks    Types: 3 Cans of beer per week  . Drug use: No    ALLERGIES:  has No Known Allergies.  MEDICATIONS:  Current Outpatient Medications  Medication Sig Dispense Refill  . acetaminophen (TYLENOL) 325 MG tablet Take 2 tablets (650 mg total) by mouth every 6 (six) hours as needed for mild pain (or Fever >/= 101).    . B Complex-C (B-COMPLEX WITH VITAMIN C) tablet Take 1 tablet by mouth daily.    Marland Kitchen dronabinol (MARINOL) 5 MG capsule Take 1 capsule (5 mg total) by mouth 2 (two) times daily before a meal. 60 capsule 0  . lidocaine-prilocaine (EMLA) cream Apply 1 application topically as needed. Apply generously over the Mediport 45 minutes prior to chemotherapy. 30 g 0  . loratadine (CLARITIN) 10 MG tablet Take 10 mg by mouth daily as needed for allergies.     Marland Kitchen LORazepam (ATIVAN) 0.5 MG tablet Take 1 tablet (0.5 mg total) by mouth every 8 (eight) hours as needed for anxiety. 45 tablet 0  . metoprolol tartrate (LOPRESSOR) 25 MG tablet Take 1 tablet (25 mg total) by mouth  2 (two) times daily. 60 tablet 2  . morphine (MS CONTIN) 15 MG 12 hr tablet Take 1 tablet (15 mg total) by mouth as needed for pain. 60 tablet 0  . Morphine Sulfate (MORPHINE CONCENTRATE) 10 mg / 0.5 ml concentrated solution Take 0.5 mLs (10 mg total) by mouth every 6 (six) hours as needed for severe pain. 30 mL 0  . Multiple Vitamin (MULTIVITAMIN WITH MINERALS) TABS tablet Take 1 tablet by mouth daily.    Marland Kitchen oxyCODONE-acetaminophen (PERCOCET) 5-325 MG tablet Take 1 tablet by mouth every 4 (four) hours as needed for severe pain. 90 tablet 0  . rivaroxaban (XARELTO) 20 MG TABS tablet Take 1 tablet (20 mg total) by mouth daily with supper. 90 tablet 3  . tadalafil (CIALIS) 5 MG tablet Take 5 mg by mouth daily as needed for erectile dysfunction.    . triamcinolone (NASACORT) 55 MCG/ACT AERO  nasal inhaler Place 2 sprays into the nose daily as needed (congestion).     . Vitamin D, Ergocalciferol, (DRISDOL) 50000 units CAPS capsule Take 1 capsule (50,000 Units total) by mouth every 7 (seven) days. (Patient taking differently: Take 50,000 Units by mouth every 7 (seven) days. Mondays) 12 capsule 0  . zolpidem (AMBIEN) 10 MG tablet Take 10 mg by mouth at bedtime as needed for sleep.     Marland Kitchen dexamethasone (DECADRON) 4 MG tablet Take 2 pills in AM and PM x 3 days; start the day prior to chemo 45 tablet 3  . dicyclomine (BENTYL) 10 MG capsule Take 1 capsule (10 mg total) by mouth 4 (four) times daily -  before meals and at bedtime. (Patient not taking: Reported on 11/01/2018) 40 capsule 3  . DULoxetine (CYMBALTA) 30 MG capsule Take 1 capsule (30 mg total) by mouth daily. 30 capsule 3  . furosemide (LASIX) 20 MG tablet Take 1 tablet (20 mg total) by mouth daily as needed for edema. (Patient not taking: Reported on 11/01/2018) 30 tablet 6  . ibuprofen (ADVIL,MOTRIN) 200 MG tablet Take 800 mg by mouth daily as needed for mild pain.     No current facility-administered medications for this visit.     PHYSICAL EXAMINATION: ECOG PERFORMANCE STATUS: 1 - Symptomatic but completely ambulatory  BP 111/72 (Patient Position: Sitting)   Pulse 89   Temp 98 F (36.7 C) (Tympanic)   Resp 20   Ht _0  (1.676 m)   Wt 137 lb (62.1 kg)   BMI 22.11 kg/m   Filed Weights   11/01/18 0838  Weight: 137 lb (62.1 kg)    Physical Exam  Constitutional: He is oriented to person, place, and time.  He is accompanied by his wife.  He appears cachectic.  He is walking himself.  Not on oxygen.  HENT:  Head: Normocephalic and atraumatic.  Mouth/Throat: Oropharynx is clear and moist. No oropharyngeal exudate.  Eyes: Pupils are equal, round, and reactive to light.  Neck: Normal range of motion. Neck supple.  Cardiovascular: Normal rate and regular rhythm.  Pulmonary/Chest: No respiratory distress. He has no wheezes.   Decreased breath sounds right side most of the bases.  Right chest wall approximately; approximately 2 to 3 cm in size (improved)  Posteriorly mid scapular region-approximately 2 to 3 cm lump noted/ new.   Abdominal: Soft. Bowel sounds are normal. He exhibits no distension and no mass. There is no abdominal tenderness. There is no rebound and no guarding.  Musculoskeletal: Normal range of motion.  General: No tenderness or edema.  Neurological: He is alert and oriented to person, place, and time.  Skin: Skin is warm.  Psychiatric: Affect normal.       LABORATORY DATA:  I have reviewed the data as listed    Component Value Date/Time   NA 135 11/01/2018 0801   NA 141 10/05/2016 0848   K 3.4 (L) 11/01/2018 0801   K 4.3 05/15/2013 0612   CL 104 11/01/2018 0801   CO2 24 11/01/2018 0801   GLUCOSE 99 11/01/2018 0801   BUN 19 11/01/2018 0801   BUN 17 10/05/2016 0848   CREATININE 0.77 11/01/2018 0801   CALCIUM 8.7 (L) 11/01/2018 0801   PROT 6.2 (L) 11/01/2018 0801   ALBUMIN 2.6 (L) 11/01/2018 0801   AST 29 11/01/2018 0801   ALT 32 11/01/2018 0801   ALKPHOS 103 11/01/2018 0801   BILITOT 0.4 11/01/2018 0801   GFRNONAA >60 11/01/2018 0801   GFRAA >60 11/01/2018 0801    No results found for: SPEP, UPEP  Lab Results  Component Value Date   WBC 5.8 11/01/2018   NEUTROABS 4.3 11/01/2018   HGB 8.6 (L) 11/01/2018   HCT 26.4 (L) 11/01/2018   MCV 99.2 11/01/2018   PLT 285 11/01/2018      Chemistry      Component Value Date/Time   NA 135 11/01/2018 0801   NA 141 10/05/2016 0848   K 3.4 (L) 11/01/2018 0801   K 4.3 05/15/2013 0612   CL 104 11/01/2018 0801   CO2 24 11/01/2018 0801   BUN 19 11/01/2018 0801   BUN 17 10/05/2016 0848   CREATININE 0.77 11/01/2018 0801      Component Value Date/Time   CALCIUM 8.7 (L) 11/01/2018 0801   ALKPHOS 103 11/01/2018 0801   AST 29 11/01/2018 0801   ALT 32 11/01/2018 0801   BILITOT 0.4 11/01/2018 0801       RADIOGRAPHIC  STUDIES: I have personally reviewed the radiological images as listed and agreed with the findings in the report. No results found.   ASSESSMENT & PLAN:  Primary cancer of right lower lobe of lung ([place]) #Non-small cell lung cancer/stage IV-October 09, 2018 CT scan-slight improvement of the right lung malignancy; however slightly increased infiltrative changes noted in the right upper lung.  Currently on Tecentriq-Avastin maintenance.  However. clinically suspicious of progressive disease-increasing right posterior pain-lump/worsening " chest pain"; as noted on the recent January 2020 CT scan.  # Proceed with Tecentriq Avastin today. CBC CMP are reviewed; adequate today; no contraindications to chemotherapy. Proceed with treatment today.   #Had a long discussion the patient and his wife regarding regarding the serious diagnosis/overall poor prognosis given his rapidly progressive disease on multiple chemotherapy.  As patient is symptomatic I would recommend switching therapy to [place].  Discussed the potential side effects including but not limited to-increasing fatigue, nausea vomiting, diarrhea, hair loss, sores in the mouth, increase risk of infection and also neuropathy.  Also discussed anti-VEGF side effects including but not limited proteinuria risk of stroke.  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  Currently on MS Contin/Percocet/morphine elixir.  Will await discussion with Billey Chang, palliative care.  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  #Malnutrition Albumin-2.8 stable.  # DISPOSITION # Treatment today- Tecen- Avastin.  # follow up in 3 weeks-MD;labs-cbc/cmp/UA -Taxoeter-Ram-Dr.B  # 40 minutes face-to-face with the patient discussing the above plan of care; more than 50% of time spent on prognosis/ natural history;  counseling and coordination.    No orders of the defined types were  placed in this encounter.  All questions were answered. The patient knows to call the clinic with any problems, questions or concerns.      Cammie Sickle, MD 11/01/2018 7:54 PM

## 2018-11-01 NOTE — Telephone Encounter (Signed)
Please inform patient that I have sent a prescription for dexamethasone instructions to start with/prior next round of chemotherapy -Tax-ram.   Thanks GB

## 2018-11-01 NOTE — Progress Notes (Signed)
[facility]  Telephone:([phone] [phone]   Name: Ruben Eaton Date: 11/01/2018 MRN: [id number]  DOB: May 11, 1971  Patient Care Team: Ruben Body, MD as PCP - General (Family Medicine) Ruben Mocha, MD as PCP - Cardiology (Cardiology) Ruben Nab, RN as Registered Nurse    REASON FOR CONSULTATION: Palliative Care consult requested for this 48 y.o. male with multiple medical problems including stage IV non-small cell lung cancer currently on Avastin and Tecentriq but is had rapid disease progression.  He has had severe chest wall pain related to his malignancy and is status post XRT but is presented to the ER several times for management of his pain.  He was referred to palliative care to help with symptom management and to address goals.  SOCIAL HISTORY:    Patient is married and lives at home with his wife.  He does not have any biological children but has a stepdaughter who is grown.  Patient still works as a Software engineer at [employer].  ADVANCE DIRECTIVES:  Patient has a living will and [facility] POA but both are not on file.  CODE STATUS:   PAST MEDICAL HISTORY: Past Medical History:  Diagnosis Date  . Anorexia   . Cryptogenic stroke ([place]) 08/2016   a. 09/15/2016 in setting of PFO.  . CVA (cerebral vascular accident) ([place]) 09/07/2016  . ED (erectile dysfunction)   . Essential (primary) hypertension 02/01/2016  . History of stroke 10/08/2016  . Hx of insomnia   . Hypertension   . OSA on CPAP   . PFO (patent foramen ovale)    a. s/p closure 09/2016 by Dr. [name].  . Pleural effusion on right 02/21/2018  . Primary cancer of right lower lobe of lung ([place]) 02/25/2018   Chemo tx's and rad tx's  . PVC's (premature ventricular contractions) 10/08/2016  . 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 TIA (transient ischemic attack) 09/07/2016    PAST SURGICAL HISTORY:  Past Surgical History:  Procedure  Laterality Date  . APPENDECTOMY  1980  . EYE SURGERY    . PATENT FORAMEN OVALE CLOSURE  10/07/2016  . PATENT FORAMEN OVALE CLOSURE N/A 10/07/2016   Procedure: Patent [place](PFO) Closure;  Surgeon: Ruben Mocha, MD;  Location: [facility];  Service: Cardiovascular;  Laterality: N/A;  . PORTACATH PLACEMENT N/A 02/23/2018   Procedure: INSERTION PORT-A-CATH;  Surgeon: Ruben Lewandowsky, MD;  Location: [facility];  Service: Thoracic;  Laterality: N/A;  . STRABISMUS SURGERY Left 1984  . TEE WITHOUT CARDIOVERSION N/A 09/08/2016   Procedure: TRANSESOPHAGEAL ECHOCARDIOGRAM (TEE);  Surgeon: Ruben Hampshire, MD;  Location: [facility];  Service: Cardiovascular;  Laterality: N/A;  . VIDEO ASSISTED THORACOSCOPY (VATS) W/TALC PLEUADESIS Right 02/23/2018   Procedure: VIDEO ASSISTED THORACOSCOPY (VATS) W/TALC PLEUADESIS;  Surgeon: Ruben Lewandowsky, MD;  Location: [facility];  Service: Thoracic;  Laterality: Right;  . VITRECTOMY Right 2013    HEMATOLOGY/ONCOLOGY HISTORY:  Oncology History   # June 2019- LUNG CA- HIGH GRADE CA with sarcomatoid features [EGFR MUTATED; exon 19; clinically non-small cell];Ruben Eaton; II opinion at [place];  # June 17th- carbo-taxol #1; PROGRESSION June 26th 2019- Osimertinib '80mg'$ /day; S/p palliative RT [facility]; June 28th, 2019]; reirradiation right chest wall finished radiation December 10th [Ruben Eaton]  # Oct 2019- Severe progression in chest; START carbo-Taxol-Tecentriq-Avastin x4 cycles; 10/09/2018- PR/?  Increasing infiltrate right upper lobe [?mixed response]; 1/16-Tecen-Avastin  # Repeat chest Bx- Nov 2019- high grade Non-small cell with sarcomatoid features  #  Dec 3rd 2019- Left Lung PE- lovenox  # HTN/ cryptogenic stroke;  # MOLECULAR TESTING- EGFR exon 19 deletion [omniseq]; Oct 2019- Plasma/guadant tetsing- EGFR exon 19del*   DIAGNOSIS: non-small cell lung ca  STAGE:  IV  ;GOALS: palliative  CURRENT/MOST RECENT THERAPY-  Carbo-Taxol-Avastin-Tecentriq      Primary cancer of right lower lobe of lung (HCC)    ALLERGIES:  has No Known Allergies.  MEDICATIONS:  Current Outpatient Medications  Medication Sig Dispense Refill  . acetaminophen (TYLENOL) 325 MG tablet Take 2 tablets (650 mg total) by mouth every 6 (six) hours as needed for mild pain (or Fever >/= 101).    . B Complex-C (B-COMPLEX WITH VITAMIN C) tablet Take 1 tablet by mouth daily.    Marland Kitchen dicyclomine (BENTYL) 10 MG capsule Take 1 capsule (10 mg total) by mouth 4 (four) times daily -  before meals and at bedtime. (Patient not taking: Reported on 11/01/2018) 40 capsule 3  . dronabinol (MARINOL) 5 MG capsule Take 1 capsule (5 mg total) by mouth 2 (two) times daily before a meal. 60 capsule 0  . furosemide (LASIX) 20 MG tablet Take 1 tablet (20 mg total) by mouth daily as needed for edema. (Patient not taking: Reported on 11/01/2018) 30 tablet 6  . ibuprofen (ADVIL,MOTRIN) 200 MG tablet Take 800 mg by mouth daily as needed for mild pain.    Marland Kitchen lidocaine-prilocaine (EMLA) cream Apply 1 application topically as needed. Apply generously over the Mediport 45 minutes prior to chemotherapy. 30 g 0  . loratadine (CLARITIN) 10 MG tablet Take 10 mg by mouth daily as needed for allergies.     Marland Kitchen LORazepam (ATIVAN) 0.5 MG tablet Take 1 tablet (0.5 mg total) by mouth every 8 (eight) hours as needed for anxiety. 45 tablet 0  . metoprolol tartrate (LOPRESSOR) 25 MG tablet Take 1 tablet (25 mg total) by mouth 2 (two) times daily. 60 tablet 2  . morphine (MS CONTIN) 15 MG 12 hr tablet Take 1 tablet (15 mg total) by mouth as needed for pain. 60 tablet 0  . Morphine Sulfate (MORPHINE CONCENTRATE) 10 mg / 0.5 ml concentrated solution Take 0.5 mLs (10 mg total) by mouth every 6 (six) hours as needed for severe pain. 30 mL 0  . Multiple Vitamin (MULTIVITAMIN WITH MINERALS) TABS tablet Take 1 tablet by mouth daily.    Marland Kitchen oxyCODONE-acetaminophen (PERCOCET) 5-325 MG tablet Take 1 tablet  by mouth every 4 (four) hours as needed for severe pain. 90 tablet 0  . prochlorperazine (COMPAZINE) 10 MG tablet Take 1 tablet (10 mg total) by mouth every 6 (six) hours as needed (Nausea or vomiting). 30 tablet 1  . rivaroxaban (XARELTO) 20 MG TABS tablet Take 1 tablet (20 mg total) by mouth daily with supper. 90 tablet 3  . tadalafil (CIALIS) 5 MG tablet Take 5 mg by mouth daily as needed for erectile dysfunction.    . triamcinolone (NASACORT) 55 MCG/ACT AERO nasal inhaler Place 2 sprays into the nose daily as needed (congestion).     . Vitamin D, Ergocalciferol, (DRISDOL) 50000 units CAPS capsule Take 1 capsule (50,000 Units total) by mouth every 7 (seven) days. (Patient taking differently: Take 50,000 Units by mouth every 7 (seven) days. Mondays) 12 capsule 0  . zolpidem (AMBIEN) 10 MG tablet Take 10 mg by mouth at bedtime as needed for sleep.      No current facility-administered medications for this visit.     VITAL SIGNS: There were no vitals  taken for this visit. There were no vitals filed for this visit.  Estimated Eaton mass index is 22.11 kg/m as calculated from the following:   Height as of an earlier encounter on 11/01/18: '5\' 6"'$  (1.676 m).   Weight as of an earlier encounter on 11/01/18: 137 lb (62.1 kg).  LABS: CBC:    Component Value Date/Time   WBC 5.8 11/01/2018 0801   HGB 8.6 (L) 11/01/2018 0801   HGB 13.2 10/05/2016 0848   HCT 26.4 (L) 11/01/2018 0801   HCT 39.1 10/05/2016 0848   PLT 285 11/01/2018 0801   PLT 250 10/05/2016 0848   MCV 99.2 11/01/2018 0801   MCV 92 10/05/2016 0848   NEUTROABS 4.3 11/01/2018 0801   NEUTROABS 1.9 10/05/2016 0848   LYMPHSABS 0.5 (L) 11/01/2018 0801   LYMPHSABS 1.9 10/05/2016 0848   MONOABS 0.7 11/01/2018 0801   EOSABS 0.2 11/01/2018 0801   EOSABS 0.2 10/05/2016 0848   BASOSABS 0.0 11/01/2018 0801   BASOSABS 0.0 10/05/2016 0848   Comprehensive Metabolic Panel:    Component Value Date/Time   NA 135 11/01/2018 0801   NA 141  10/05/2016 0848   K 3.4 (L) 11/01/2018 0801   K 4.3 05/15/2013 0612   CL 104 11/01/2018 0801   CO2 24 11/01/2018 0801   BUN 19 11/01/2018 0801   BUN 17 10/05/2016 0848   CREATININE 0.77 11/01/2018 0801   GLUCOSE 99 11/01/2018 0801   CALCIUM 8.7 (L) 11/01/2018 0801   AST 29 11/01/2018 0801   ALT 32 11/01/2018 0801   ALKPHOS 103 11/01/2018 0801   BILITOT 0.4 11/01/2018 0801   PROT 6.2 (L) 11/01/2018 0801   ALBUMIN 2.6 (L) 11/01/2018 0801    RADIOGRAPHIC STUDIES: Ct Chest W Contrast  Result Date: 10/09/2018 CLINICAL DATA:  Non-small-cell carcinoma with sarcomatoid features. Right lower lobe primary. EXAM: CT CHEST WITH CONTRAST TECHNIQUE: Multidetector CT imaging of the chest was performed during intravenous contrast administration. CONTRAST:  64m ISOVUE-300 IOPAMIDOL (ISOVUE-300) INJECTION 61% COMPARISON:  08/28/2018 FINDINGS: Cardiovascular: Right Port-A-Cath tip at high right atrium. Normal heart size, without pericardial effusion. No central pulmonary embolism, on this non-dedicated study. The previously described left-sided pulmonary emboli are no longer identified. Mediastinum/Nodes: Direct tumor involvement of the right-side of the mediastinum, including on image 33/4. Right paratracheal node of 12 mm is felt to be similar. Right hilum not well evaluated. Mediastinum similar to the displaced to the left. Lungs/Pleura: No pleural fluid. Circumferential right-sided tumor which is primarily centered about the pleural space. This is relatively diffuse throughout the right hemithorax. Again identified is chest wall involvement. Inferiorly, measures on the order of 20.2 x 17.7 cm today versus 24.8 x 18.7 on the prior. Anteromedial component at the level of the ascending aorta measures 6.3 x 4.3 cm today versus 7.3 x 5.3 cm on the prior. Lateral component measures 3.1 cm thickness on image 80/4 versus 4.8 cm on the prior (when remeasured). Parent airways. Right lung base aeration is slightly  improved. There is worsened airspace disease involving the right upper lobe with significant air bronchograms. Left-sided pulmonary nodules are similar, including within the left lower lobe at 6 mm on image 115/5. Upper Abdomen: Normal imaged portions of the liver, spleen, stomach, pancreas, adrenal glands, kidneys. Musculoskeletal: No acute osseous abnormality. IMPRESSION: 1. Since 08/28/2018, mild improvement in tumor burden throughout the right hemithorax, with persistent mass effect upon the mediastinum and cardiac structures with displacement to the left. 2. Improved right base aeration with worsening right upper  lobe and apical pulmonary opacities. Considerations remain tumor and/or concurrent pneumonia. 3. Similar left-sided pulmonary nodularity, nonspecific. Electronically Signed   By: Abigail Miyamoto M.D.   On: 10/09/2018 11:09   Ct Abdomen Pelvis W Contrast  Result Date: 10/24/2018 CLINICAL DATA:  Acute abdominal pain. Patient reports chest pain. Lung cancer with active chemotherapy. EXAM: CT ABDOMEN AND PELVIS WITH CONTRAST TECHNIQUE: Multidetector CT imaging of the abdomen and pelvis was performed using the standard protocol following bolus administration of intravenous contrast. CONTRAST:  170m ISOVUE-300 IOPAMIDOL (ISOVUE-300) INJECTION 61% COMPARISON:  Most recent comparison CT 12 days prior 10/12/2018 FINDINGS: Lower chest: Heterogeneous soft tissue pleural-based masses in the right lower hemithorax extends to the subcarinal region in causes anterior displacement of the heart and mediastinum. Associated posterior displacement of the descending aorta. Esophagus is difficult to delineate from the large lesions. Pleural lesions extend inferiorly deep in the pleural space and efface the right hepatic lobe as before. Hepatobiliary: Mass-effect in the right hepatic lobe by pleural lesions. No discrete focal hepatic abnormality. Gallbladder physiologically distended, no calcified stone. No biliary  dilatation. Pancreas: No ductal dilatation or inflammation. Spleen: Normal in size without focal abnormality. Adrenals/Urinary Tract: No adrenal nodule. Right kidney displaced anteriorly by right pleural based mass. No hydronephrosis. Left kidney is unremarkable. Homogeneous renal enhancement with symmetric excretion on delayed phase imaging. Urinary bladder is physiologically distended without wall thickening. Stomach/Bowel: Bowel evaluation is limited in the absence of enteric contrast and paucity of intra-abdominal fat. No evidence of bowel obstruction or inflammation. Appendix not well visualized. Moderate colonic stool burden with stool distending the rectum. Vascular/Lymphatic: Normal caliber abdominal aorta. Portal vein and mesenteric vessels are patent. No bulky adenopathy, however lack of enteric contrast limits detailed assessment. Reproductive: Prostate is unremarkable. Other: Trace free fluid in the pelvis, nonspecific. No free air. No evidence of intra-abdominal abscess. Musculoskeletal: Soft tissue edema in the right flank subcutaneous tissues with thickening of the posterolateral abdominal wall musculature, likely secondary to tumor invasion by pleural based lesions. No focal osseous lesion. IMPRESSION: 1. No acute abnormality in the abdomen/pelvis. 2. Known pleural-based masses throughout the right hemithorax causing mass effect on the mediastinum and right upper quadrant, not significantly changed from CT 12 days ago. 3. Soft tissue edema in the right flank subcutaneous tissues with thickening of the posterolateral abdominal wall musculature, may be reactive secondary to chest/abdominal wall invasion by pleural based lesions. Electronically Signed   By: MKeith RakeM.D.   On: 10/24/2018 00:51   Dg Chest Portable 1 View  Result Date: 10/24/2018 CLINICAL DATA:  Chest pain, abdominal pain.  Lung cancer. EXAM: PORTABLE CHEST 1 VIEW COMPARISON:  Chest CT 10/09/2018 FINDINGS: Right Port-A-Cath  remains in place, unchanged. Severe right pleural thickening and right lung airspace disease again noted as seen on recent chest CT, not significantly changed. Left lung clear. Heart is normal size. No acute bony abnormality. IMPRESSION: Right pleural thickening and diffuse right lung airspace disease, stable since recent CT. Electronically Signed   By: KRolm BaptiseM.D.   On: 10/24/2018 00:00   Ct Renal Stone Study  Result Date: 10/12/2018 CLINICAL DATA:  RIGHT flank pain following chemotherapy. LEFT lung carcinoma. EXAM: CT ABDOMEN AND PELVIS WITHOUT CONTRAST TECHNIQUE: Multidetector CT imaging of the abdomen and pelvis was performed following the standard protocol without IV contrast. COMPARISON:  PET-CT 03/01/2018, chest 08/28/2018, CT abdomen 07/12/2018 FINDINGS: Lower chest: Large bulky mass occupying the pleural space of the RIGHT lower lobe with no improvement from comparison  CT of 07/12/2018. The bulky pleural metastasis extends into the mediastinum mass also extends inferiorly deep in the pleural space and effaces the RIGHT hepatic lobe. This thickening in the inferior RIGHT pleural space measures 5 cm effaces the liver. Hepatobiliary: Liver is effaced by the pleural tumor as above. No focal hepatic lesion. No biliary duct dilatation. Gallbladder normal. Pancreas: Pancreas grossly normal Spleen: Normal Adrenals/Urinary Tract: Adrenal glands normal. The RIGHT kidney is elevated by a 4.8 cm mass extension from the pleural space to the level of the kidney. This mass is increased CT of 07/12/2018 (3.6 cm) and similar to CT of 08/28/2018. Stomach/Bowel: Stomach small-bowel grossly normal. The bowel is difficult to evaluate due to no IV contrast in little intra-abdominal fat. No high-grade obstruction Vascular/Lymphatic: No significant vascular findings are present. No enlarged abdominal or pelvic lymph nodes. Reproductive: Uterus and bilateral adnexa are unremarkable. Other: Small amount of intraperitoneal  free fluid Musculoskeletal: IMPRESSION: 1. Pleural masses extend deep into the RIGHT upper quadrant and efface the liver and elevate the RIGHT kidney. Mass increased from size from 07/12/2018 and similar to 08/28/2018. Mass effect upon the kidney could potentially cause RIGHT flank pain. No evidence of RIGHT kidney obstruction. 2. No additional acute findings in the abdomen pelvis. Exam difficult to interpret without IV oral oral contrast little intra-abdominal fat. Electronically Signed   By: Suzy Bouchard M.D.   On: 10/12/2018 11:12    PERFORMANCE STATUS (ECOG) : 1 - Symptomatic but completely ambulatory  Review of Systems As noted above. Otherwise, a complete review of systems is negative.  Physical Exam General: NAD, frail appearing, thin Cardiovascular: regular rate and rhythm Pulmonary: clear ant fields Abdomen: soft, nontender, + bowel sounds GU: no suprapubic tenderness Extremities: no edema, no joint deformities Skin: no rashes Neurological: Weakness but otherwise nonfocal  IMPRESSION: I met today with patient and his wife in the clinic.  I introduced palliative care services and attempted to establish therapeutic rapport.  Patient has had rapidly progressing disease and met today with Dr. Rogue Bussing with consideration for transitioning to new systemic treatment in the coming weeks.  Patient says that he has had some thoughts recently about stopping everything and transition to comfort care but attributes that to depression.  He says his goals are still aligned with treatment in hopes to prolong his life with good quality, for as long as possible.  He also desires to continue working for as long as possible and reports that is his primary coping strategy at this point.  Symptomatically, patient has had persistent chest wall pain requiring several recent trips to the ER for management.  He is currently taking MS Contin 15 mg every 12 hours, Percocet 5 325 mg every 4 hours and  morphine elixir which he takes 7 to 8 mg per dose.  Patient says he rotates between the morphine elixir and the Percocet, both of which have been effective at controlling breakthrough pain.  He says he is averaging 6-8 doses of short acting opioid per 24-hours.  He currently rates pain as 3 out of 10, which is his pain goal.  However, pain frequently gets as high as 8 out of 10.  Pain is also frequently waking him at night.    Patient agreeable to increasing frequency of MS Contin to every 8 hours.  He plans to finish his current stock of Percocet and morphine elixir and then we will rotate to oxycodone IR.  He has taken ibuprofen in the past with good effect  but is not currently taking it.  I recommended that he restart ibuprofen 3 times daily and start a PPI daily for gastric protection.  Patient endorses depressive symptoms.  He has had some futile thinking but does not endorse active SI/HI.  He would be interested in starting an antidepressant.  He has taken Wellbutrin in the past with good effect.  However, patient agreeable to trial of Cymbalta, which might offer some improvement with neuropathic pain.  We will also make referral for counseling services.  Patient has had persistent constipation.  He has been taking senna once daily.  He will increase that to twice daily.  We discussed CODE STATUS.  Patient says he thinks he would want to be a DNR/DNI.  I reviewed with him a MOST Form and will complete that at time of next visit.  PLAN: 1.  Continue current scope of treatment 2.  Increase MS Contin to 15 mg every 8 hours 3.  Continue Percocet 5-'325mg'$  Q4H 4.  Patient wants to continue using Percocet and morphine elixir at home but will likely switch to oxycodone IR when he is out of Percocet and morphine 5.  Recommended ibuprofen 600 to 800 mg 3 times daily 6.  Start PPI daily for gastric protection 7.  Increase senna to 1 tablet twice daily and titrate  8.  Start Cymbalta 30 mg daily 9.   Referral to [facility], LCSW for counseling 10.  MOST form reviewed and will complete at time of next visit 11.  RTC in 3 weeks or sooner if needed   Patient expressed understanding and was in agreement with this plan. He also understands that He can call clinic at any time with any questions, concerns, or complaints.     Time Total: 45 minutes  Visit consisted of counseling and education dealing with the complex and emotionally intense issues of symptom management and palliative care in the setting of serious and potentially life-threatening illness.Greater than 50%  of this time was spent counseling and coordinating care related to the above assessment and plan.  Signed by: Altha Harm, PhD, NP-C [phone] (Work Cell)

## 2018-11-02 NOTE — Telephone Encounter (Signed)
Contacted patient regarding the decadron prescription. Pt gave verbal understanding of the plan of care.

## 2018-11-07 ENCOUNTER — Encounter (HOSPITAL_COMMUNITY): Payer: Self-pay

## 2018-11-07 IMAGING — DX DG CHEST 1V PORT
1 series · 1 of 1 positions shown · non-contrast
Comparison: 03/12/2018 chest radiograph.

CLINICAL DATA: Recently diagnosed right upper lobe lung cancer with
malignant right pleural effusion status post right VATS with talc
pleurodesis. Worsening dyspnea

EXAM:
PORTABLE CHEST 1 VIEW

[chest ap]
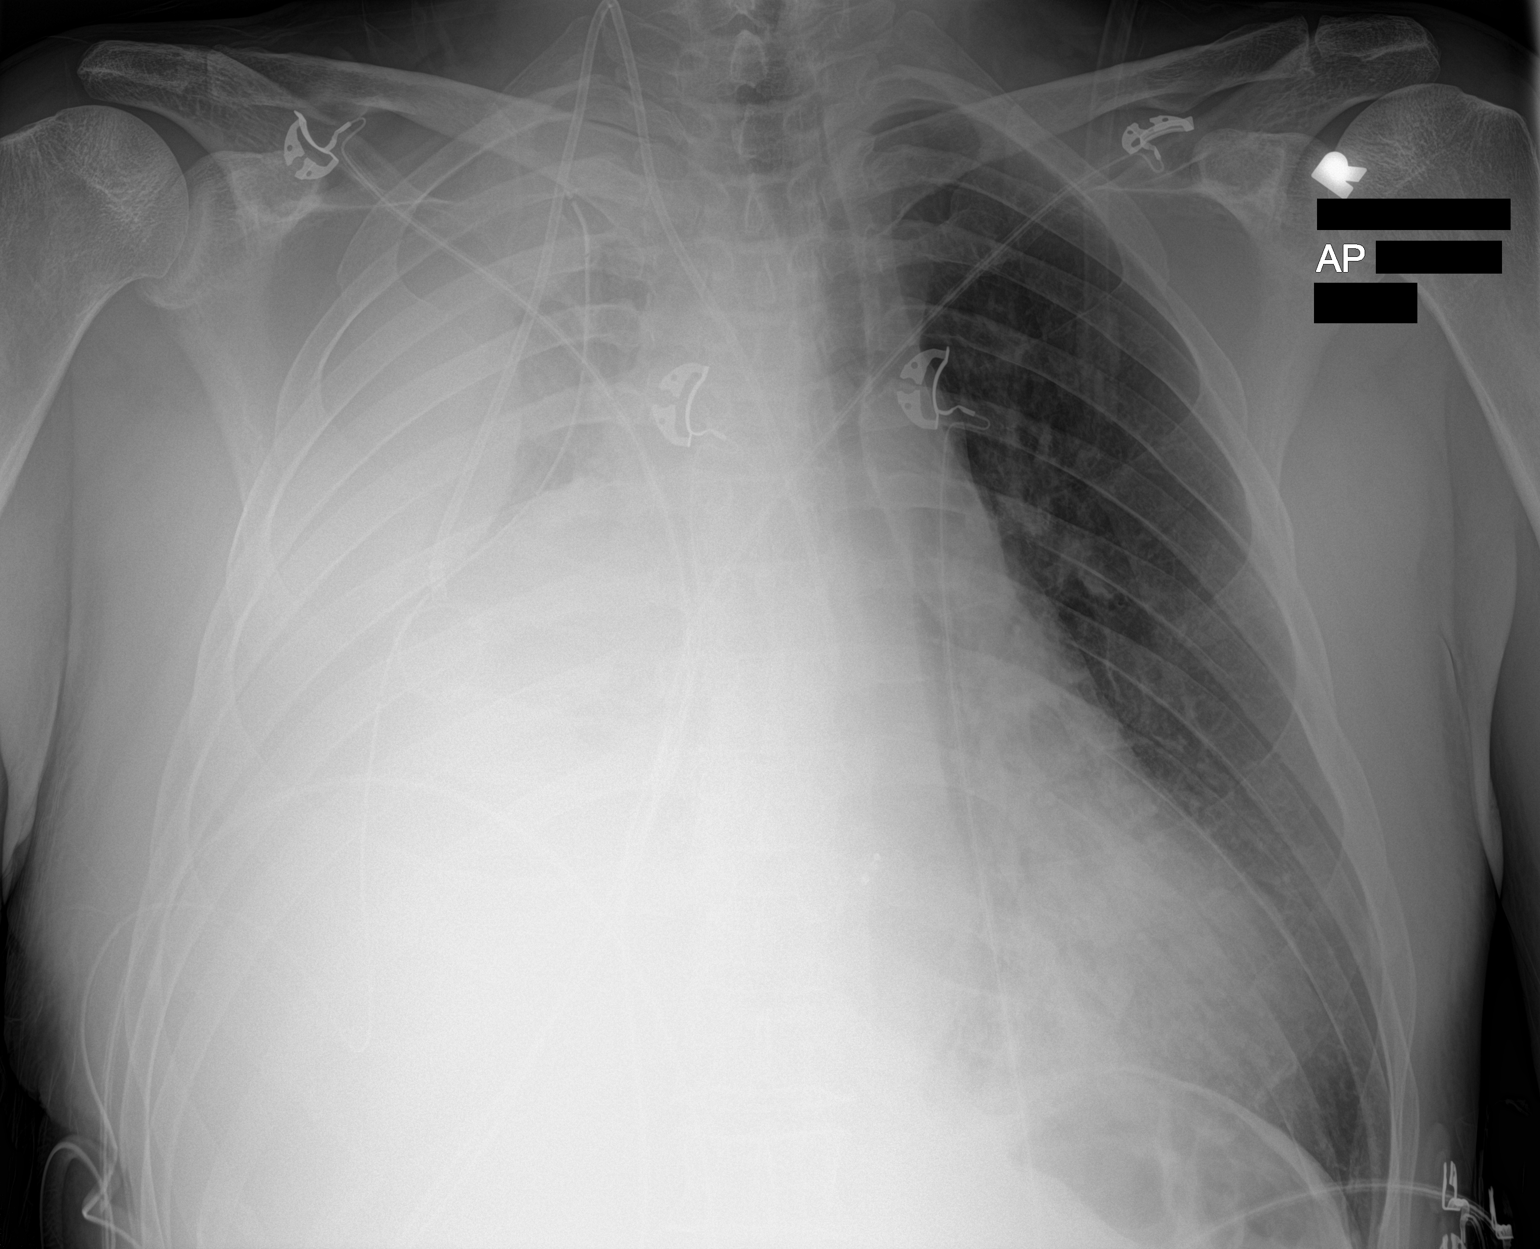

[1 of 1 positions shown; findings below may reference images not displayed]

FINDINGS: Right internal jugular MediPort terminates at the cavoatrial
junction. Right apical chest tube is in place. Worsening near
complete opacification of the right hemithorax with minimal residual
aeration in the right upper lung. Worsening marked circumferential
right pleural thickening/effusion. Worsened left mediastinal shift.
Normal heart size. Stable mediastinal contour. No pneumothorax. No
left pleural effusion. Increased patchy left retrocardiac opacity.
IMPRESSION: 1. Worsening near complete opacification of the right hemithorax
with minimal residual aeration in the right upper lung and worsening
diffuse marked right pleural thickening/effusion.
2. Worsening left mediastinal shift with increased patchy left
retrocardiac opacity, probably atelectasis.

## 2018-11-07 NOTE — Progress Notes (Signed)
Social worker provided counseling services to patient at the cancer center. 

## 2018-11-10 IMAGING — DX DG CHEST 1V PORT
2 series · 2 of 2 positions shown · non-contrast
Comparison: None.

CLINICAL DATA: 47-year-old male with acute respiratory failure.
History of VATS on 02/23/2018 and right lower lobe cancer.

EXAM:
PORTABLE CHEST 1 VIEW

[chest ap (1 of 2)]
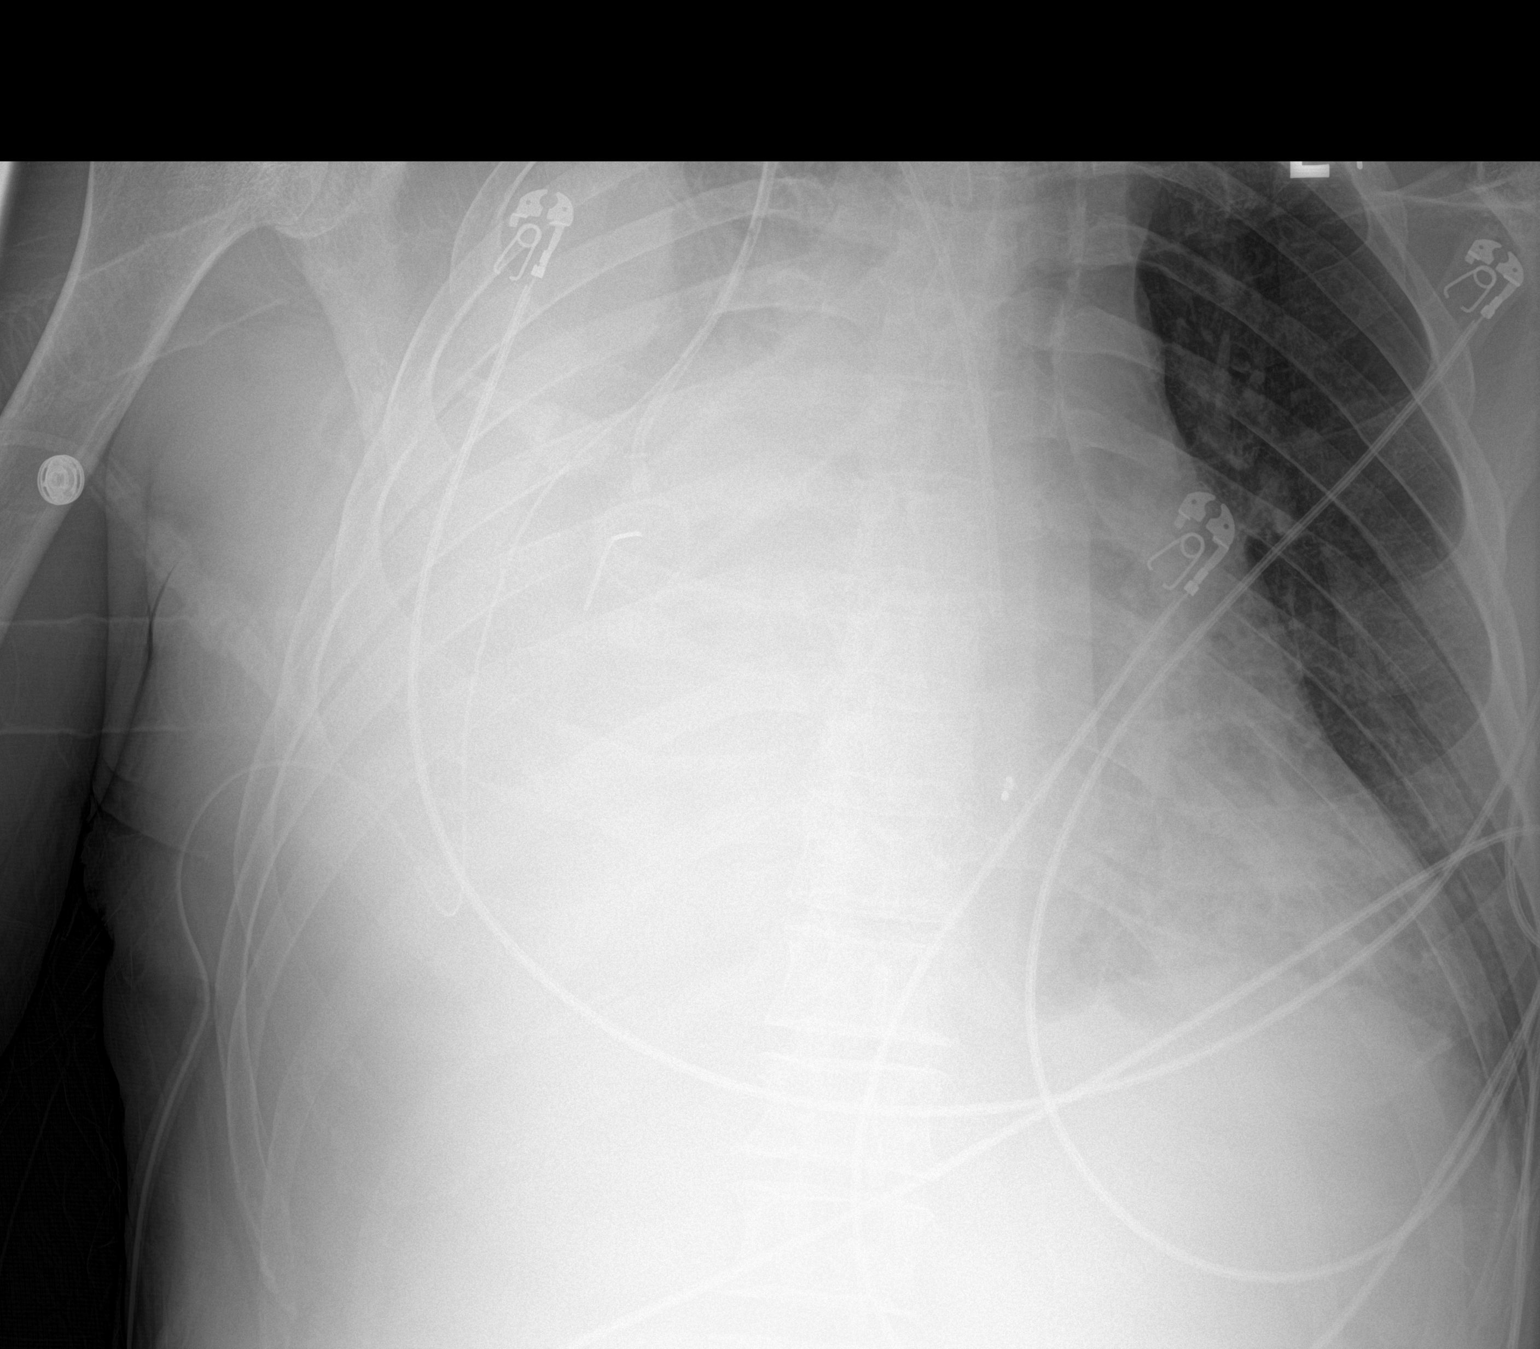

[chest ap (2 of 2)]
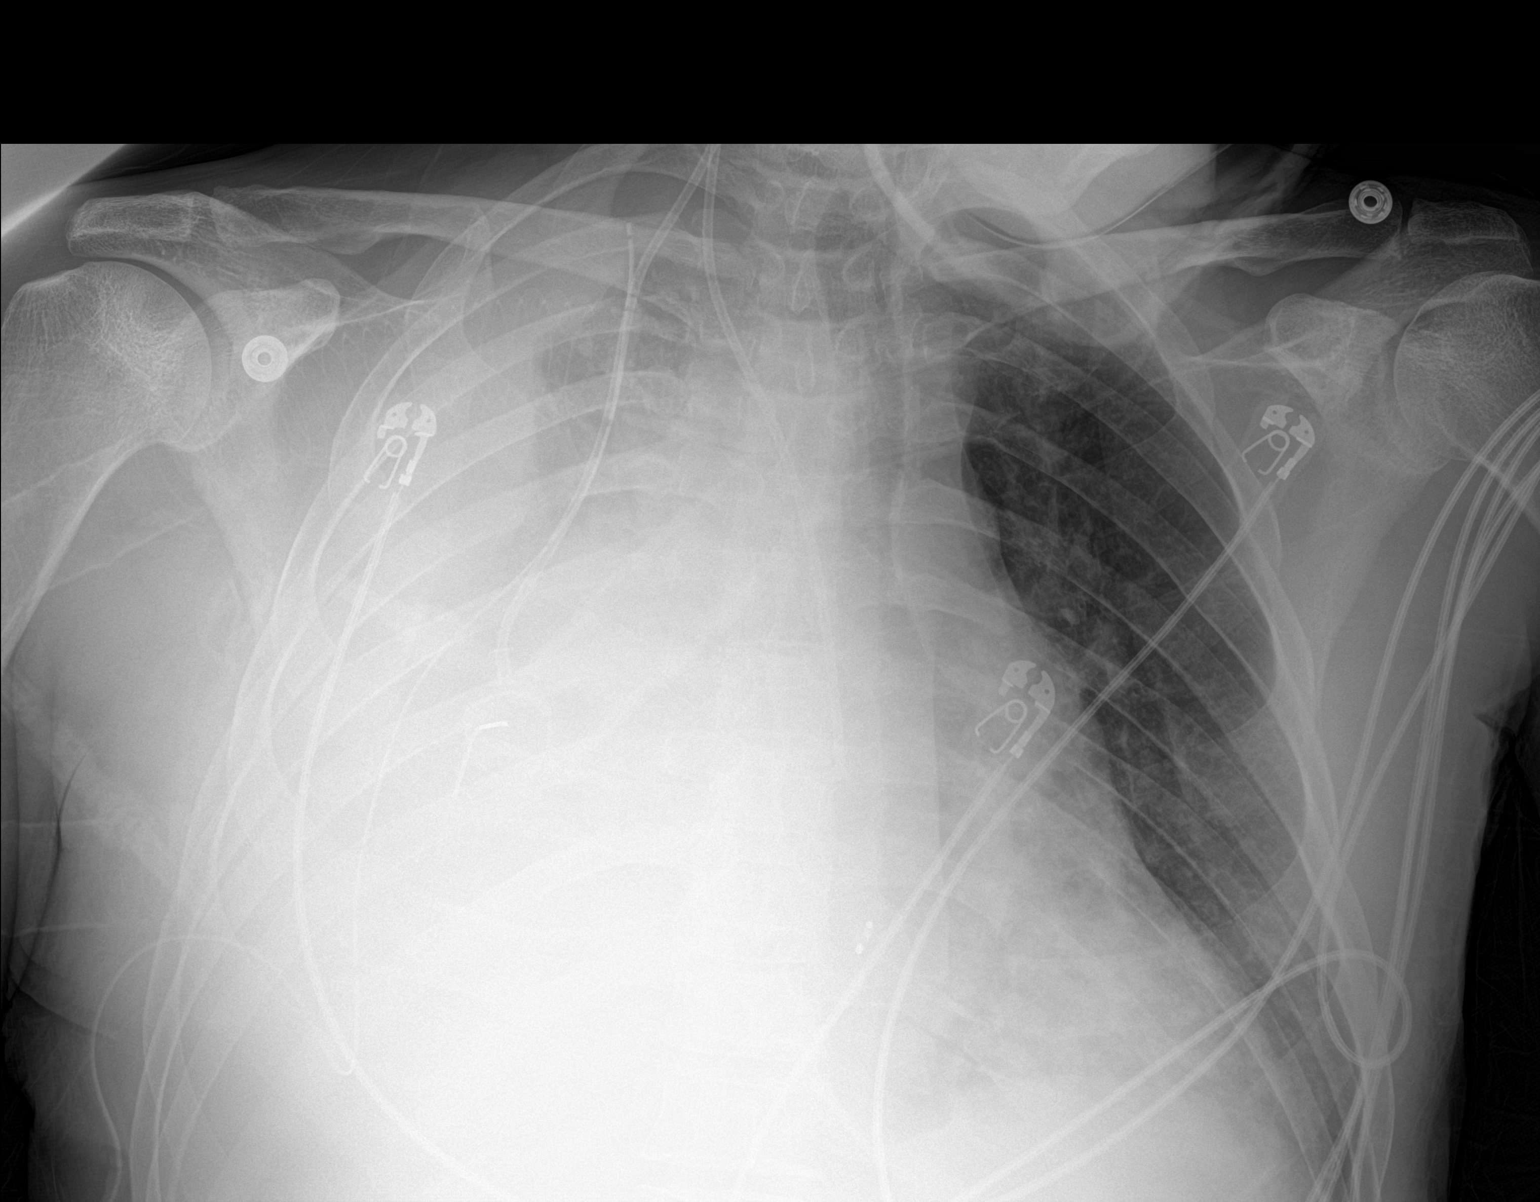

[2 of 2 positions shown; findings below may reference images not displayed]

FINDINGS: Right-sided Port-A-Cath with tip over the mediastinum.

There is a large pleural effusion with near complete consolidative
changes of the right lung. There is only a small aerated portion of
the right lung in the upper lobe. Left lung base densities may
represent atelectasis or infiltrate. A small left pleural effusion
may be present. There is no pneumothorax. There is silhouetting of
the right cardiac border. No acute osseous pathology.
IMPRESSION: Near complete opacification of the right hemithorax secondary to
large right pleural effusion and associated right lung compressive
atelectasis or consolidation.

Left lung base atelectasis versus infiltrate.

## 2018-11-11 ENCOUNTER — Encounter: Payer: Self-pay | Admitting: Internal Medicine

## 2018-11-12 ENCOUNTER — Telehealth: Payer: Self-pay | Admitting: Hospice and Palliative Medicine

## 2018-11-12 ENCOUNTER — Other Ambulatory Visit: Payer: Self-pay | Admitting: *Deleted

## 2018-11-12 MED ORDER — HYDROMORPHONE HCL 2 MG PO TABS: 2.0000 mg | ORAL_TABLET | ORAL | 0 refills | Status: DC | PRN

## 2018-11-12 MED ORDER — MORPHINE SULFATE ER 15 MG PO TBCR: 15.0000 mg | EXTENDED_RELEASE_TABLET | Freq: Three times a day (TID) | ORAL | 0 refills | Status: DC

## 2018-11-12 NOTE — Telephone Encounter (Signed)
Patient called asking if you are still willing to start him on Dilaudid, if so, he needs it

## 2018-11-12 NOTE — Telephone Encounter (Signed)
I spoke with patient by phone. He says that the pain improved slightly with increased MS Contin but that he cannot tell much benefit from continued use of the Percocet. We had previously discussed option of rotating him to oral hydromorphone as IV hydromorphone had been particularly effective. Will d/c Percocet and morphine elixir and start oral hydromorphone. Will continue MS Contin at [facility] dosing with consideration for future increase as needed.   Discussed with Dr. Rogue Bussing.   [place] CSRS reviewed.  Plan: Discontinue percocet and morphine Start hydromorphone 2mg  po Q4H as needed for pain Continue MS Contin 15mg  Q8H

## 2018-11-15 ENCOUNTER — Telehealth: Payer: Self-pay | Admitting: *Deleted

## 2018-11-15 ENCOUNTER — Other Ambulatory Visit: Payer: Self-pay | Admitting: Hospice and Palliative Medicine

## 2018-11-15 MED ORDER — HYDROMORPHONE HCL 2 MG PO TABS: 2.0000 mg | ORAL_TABLET | ORAL | 0 refills | Status: DC | PRN

## 2018-11-15 MED ORDER — MORPHINE SULFATE ER 15 MG PO TBCR: 30.0000 mg | EXTENDED_RELEASE_TABLET | Freq: Two times a day (BID) | ORAL | 0 refills | Status: DC

## 2018-11-15 NOTE — Progress Notes (Signed)
Spoke with patient by phone. He says the hydromorphone is helping the pain but is short-lived. We had previously discussed the option of increasing the dose of the MS Contin and patient would like to proceed with this. Will change MS Contin to 30mg  Q12H. May also increase hydromorphone to 2-4mg  Q4H PRN for BTP.   Plan: 1. MS Contin 30mg  Q12H. May use remaining supply of 15mg  tablets and then would need a new prescription for 30mg  tablets.  2. Increase hydromorphone to 2-4mg  Q4H PRN for BTP.  3. D/C Percocet and morphine elixir.  4. [place] CSRS reviewed

## 2018-11-15 NOTE — Telephone Encounter (Signed)
Spoke with patient. Medications adjusted.

## 2018-11-15 NOTE — Telephone Encounter (Signed)
Patient called asking that Ruben Eaton return his call [phone]

## 2018-11-19 DIAGNOSIS — C3431 Malignant neoplasm of lower lobe, right bronchus or lung: Secondary | ICD-10-CM | POA: Diagnosis not present

## 2018-11-19 DIAGNOSIS — J918 Pleural effusion in other conditions classified elsewhere: Secondary | ICD-10-CM | POA: Diagnosis not present

## 2018-11-21 ENCOUNTER — Other Ambulatory Visit: Payer: Self-pay | Admitting: Internal Medicine

## 2018-11-21 ENCOUNTER — Encounter (HOSPITAL_COMMUNITY): Payer: Self-pay

## 2018-11-21 ENCOUNTER — Other Ambulatory Visit: Payer: Self-pay | Admitting: *Deleted

## 2018-11-21 DIAGNOSIS — C3431 Malignant neoplasm of lower lobe, right bronchus or lung: Secondary | ICD-10-CM

## 2018-11-21 NOTE — Progress Notes (Signed)
Social worker provided counseling to patient at the [employer]

## 2018-11-22 ENCOUNTER — Inpatient Hospital Stay (HOSPITAL_BASED_OUTPATIENT_CLINIC_OR_DEPARTMENT_OTHER): Payer: 59 | Admitting: Internal Medicine

## 2018-11-22 ENCOUNTER — Inpatient Hospital Stay (HOSPITAL_BASED_OUTPATIENT_CLINIC_OR_DEPARTMENT_OTHER): Payer: 59 | Admitting: Hospice and Palliative Medicine

## 2018-11-22 ENCOUNTER — Inpatient Hospital Stay: Payer: 59

## 2018-11-22 VITALS — BP 121/82 | HR 88 | Temp 97.4°F | Resp 16 | Wt 133.0 lb

## 2018-11-22 VITALS — BP 121/80 | HR 64 | Resp 18

## 2018-11-22 DIAGNOSIS — I10 Essential (primary) hypertension: Secondary | ICD-10-CM | POA: Diagnosis not present

## 2018-11-22 DIAGNOSIS — Z8673 Personal history of transient ischemic attack (TIA), and cerebral infarction without residual deficits: Secondary | ICD-10-CM

## 2018-11-22 DIAGNOSIS — Z79899 Other long term (current) drug therapy: Secondary | ICD-10-CM

## 2018-11-22 DIAGNOSIS — R9389 Abnormal findings on diagnostic imaging of other specified body structures: Secondary | ICD-10-CM | POA: Diagnosis not present

## 2018-11-22 DIAGNOSIS — R05 Cough: Secondary | ICD-10-CM

## 2018-11-22 DIAGNOSIS — Z7189 Other specified counseling: Secondary | ICD-10-CM

## 2018-11-22 DIAGNOSIS — Z791 Long term (current) use of non-steroidal anti-inflammatories (NSAID): Secondary | ICD-10-CM

## 2018-11-22 DIAGNOSIS — Z7901 Long term (current) use of anticoagulants: Secondary | ICD-10-CM

## 2018-11-22 DIAGNOSIS — R5383 Other fatigue: Secondary | ICD-10-CM | POA: Diagnosis not present

## 2018-11-22 DIAGNOSIS — Z87891 Personal history of nicotine dependence: Secondary | ICD-10-CM

## 2018-11-22 DIAGNOSIS — G4733 Obstructive sleep apnea (adult) (pediatric): Secondary | ICD-10-CM | POA: Diagnosis not present

## 2018-11-22 DIAGNOSIS — C3431 Malignant neoplasm of lower lobe, right bronchus or lung: Secondary | ICD-10-CM

## 2018-11-22 DIAGNOSIS — M549 Dorsalgia, unspecified: Secondary | ICD-10-CM | POA: Diagnosis not present

## 2018-11-22 DIAGNOSIS — R630 Anorexia: Secondary | ICD-10-CM | POA: Diagnosis not present

## 2018-11-22 DIAGNOSIS — F329 Major depressive disorder, single episode, unspecified: Secondary | ICD-10-CM

## 2018-11-22 DIAGNOSIS — Z9989 Dependence on other enabling machines and devices: Secondary | ICD-10-CM

## 2018-11-22 DIAGNOSIS — Z515 Encounter for palliative care: Secondary | ICD-10-CM | POA: Diagnosis not present

## 2018-11-22 DIAGNOSIS — R7989 Other specified abnormal findings of blood chemistry: Secondary | ICD-10-CM

## 2018-11-22 DIAGNOSIS — R5381 Other malaise: Secondary | ICD-10-CM | POA: Diagnosis not present

## 2018-11-22 DIAGNOSIS — K5900 Constipation, unspecified: Secondary | ICD-10-CM | POA: Diagnosis not present

## 2018-11-22 DIAGNOSIS — E46 Unspecified protein-calorie malnutrition: Secondary | ICD-10-CM | POA: Diagnosis not present

## 2018-11-22 DIAGNOSIS — G893 Neoplasm related pain (acute) (chronic): Secondary | ICD-10-CM | POA: Diagnosis not present

## 2018-11-22 DIAGNOSIS — Z5111 Encounter for antineoplastic chemotherapy: Secondary | ICD-10-CM | POA: Diagnosis not present

## 2018-11-22 LAB — COMPREHENSIVE METABOLIC PANEL
ALT: 64 U/L — ABNORMAL HIGH (ref 0–44)
AST: 63 U/L — ABNORMAL HIGH (ref 15–41)
Albumin: 2.5 g/dL — ABNORMAL LOW (ref 3.5–5.0)
Alkaline Phosphatase: 150 U/L — ABNORMAL HIGH (ref 38–126)
Anion gap: 9 (ref 5–15)
BUN: 20 mg/dL (ref 6–20)
CALCIUM: 9 mg/dL (ref 8.9–10.3)
CHLORIDE: 99 mmol/L (ref 98–111)
CO2: 25 mmol/L (ref 22–32)
Creatinine, Ser: 0.63 mg/dL (ref 0.61–1.24)
GFR calc Af Amer: >60 mL/min (ref >60)
GFR calc non Af Amer: >60 mL/min (ref >60)
Glucose, Bld: 127 mg/dL — ABNORMAL HIGH (ref 70–99)
Potassium: 3.8 mmol/L (ref 3.5–5.1)
Sodium: 133 mmol/L — ABNORMAL LOW (ref 135–145)
Total Bilirubin: 0.3 mg/dL (ref 0.3–1.2)
Total Protein: 6.1 g/dL — ABNORMAL LOW (ref 6.5–8.1)

## 2018-11-22 LAB — URINALYSIS, COMPLETE (UACMP) WITH MICROSCOPIC
BILIRUBIN URINE: NEGATIVE
Glucose, UA: NEGATIVE mg/dL
Hgb urine dipstick: NEGATIVE
Ketones, ur: NEGATIVE mg/dL
Leukocytes,Ua: NEGATIVE
Nitrite: NEGATIVE
PROTEIN: NEGATIVE mg/dL
Specific Gravity, Urine: 1.029 (ref 1.005–1.030)
pH: 5 (ref 5.0–8.0)

## 2018-11-22 LAB — CBC WITH DIFFERENTIAL/PLATELET
Abs Immature Granulocytes: 0.05 10*3/uL (ref 0.00–0.07)
Basophils Absolute: 0 10*3/uL (ref 0.0–0.1)
Basophils Relative: 0 %
EOS PCT: 0 %
Eosinophils Absolute: 0 10*3/uL (ref 0.0–0.5)
HCT: 28.8 % — ABNORMAL LOW (ref 39.0–52.0)
Hemoglobin: 9.4 g/dL — ABNORMAL LOW (ref 13.0–17.0)
Immature Granulocytes: 1 %
Lymphocytes Relative: 5 %
Lymphs Abs: 0.5 10*3/uL — ABNORMAL LOW (ref 0.7–4.0)
MCH: 32.4 pg (ref 26.0–34.0)
MCHC: 32.6 g/dL (ref 30.0–36.0)
MCV: 99.3 fL (ref 80.0–100.0)
Monocytes Absolute: 1 10*3/uL (ref 0.1–1.0)
Monocytes Relative: 9 %
Neutro Abs: 9.4 10*3/uL — ABNORMAL HIGH (ref 1.7–7.7)
Neutrophils Relative %: 85 %
Platelets: 281 10*3/uL (ref 150–400)
RBC: 2.9 MIL/uL — ABNORMAL LOW (ref 4.22–5.81)
RDW: 14.3 % (ref 11.5–15.5)
WBC: 11 10*3/uL — ABNORMAL HIGH (ref 4.0–10.5)
nRBC: 0 % (ref 0.0–0.2)

## 2018-11-22 MED ORDER — ACETAMINOPHEN 325 MG PO TABS
650.0000 mg | ORAL_TABLET | Freq: Once | ORAL | Status: AC
  Administered 2018-11-22: 650 mg via ORAL
  Filled 2018-11-22: qty 2

## 2018-11-22 MED ORDER — SODIUM CHLORIDE 0.9% FLUSH
10.0000 mL | INTRAVENOUS | Status: DC | PRN
  Administered 2018-11-22: 10 mL
  Filled 2018-11-22: qty 10

## 2018-11-22 MED ORDER — SODIUM CHLORIDE 0.9 % IV SOLN
10.0000 mg/kg | Freq: Once | INTRAVENOUS | Status: AC
  Administered 2018-11-22: 600 mg via INTRAVENOUS
  Filled 2018-11-22: qty 50

## 2018-11-22 MED ORDER — DIPHENHYDRAMINE HCL 50 MG/ML IJ SOLN
50.0000 mg | Freq: Once | INTRAMUSCULAR | Status: AC
  Administered 2018-11-22: 50 mg via INTRAVENOUS
  Filled 2018-11-22: qty 1

## 2018-11-22 MED ORDER — SODIUM CHLORIDE 0.9 % IV SOLN
Freq: Once | INTRAVENOUS | Status: AC
  Administered 2018-11-22: 10:00:00 via INTRAVENOUS
  Filled 2018-11-22: qty 250

## 2018-11-22 MED ORDER — HYDROMORPHONE HCL 2 MG PO TABS: 2.0000 mg | ORAL_TABLET | ORAL | 0 refills | Status: AC | PRN

## 2018-11-22 MED ORDER — SODIUM CHLORIDE 0.9 % IV SOLN
75.0000 mg/m2 | Freq: Once | INTRAVENOUS | Status: AC
  Administered 2018-11-22: 130 mg via INTRAVENOUS
  Filled 2018-11-22: qty 13

## 2018-11-22 MED ORDER — MORPHINE SULFATE ER 30 MG PO TBCR: 30.0000 mg | EXTENDED_RELEASE_TABLET | Freq: Two times a day (BID) | ORAL | 0 refills | Status: AC

## 2018-11-22 MED ORDER — DEXAMETHASONE SODIUM PHOSPHATE 10 MG/ML IJ SOLN
10.0000 mg | Freq: Once | INTRAMUSCULAR | Status: AC
  Administered 2018-11-22: 10 mg via INTRAVENOUS
  Filled 2018-11-22: qty 1

## 2018-11-22 MED ORDER — HEPARIN SOD (PORK) LOCK FLUSH 100 UNIT/ML IV SOLN
500.0000 [IU] | Freq: Once | INTRAVENOUS | Status: AC | PRN
  Administered 2018-11-22: 500 [IU]
  Filled 2018-11-22: qty 5

## 2018-11-22 NOTE — Progress Notes (Signed)
[facility]  [phone] [phone]   Name: Ruben Eaton Date: 11/22/2018 MRN: [id number]  DOB: 11/06/1970  Patient Care Team: Dion Body, MD as PCP - General (Family Medicine) [name], MD as PCP - Cardiology (Cardiology) Telford Nab, RN as Registered Nurse    REASON FOR CONSULTATION: Palliative Care consult requested for this 48 y.o. male with multiple medical problems including stage IV non-small cell lung cancer currently on Avastin and Tecentriq but is had rapid disease progression.  He has had severe chest wall pain related to his malignancy and is status post XRT but is presented to the ER several times for management of his pain.  He was referred to palliative care to help with symptom management and to address goals.  SOCIAL HISTORY:    Patient is married and lives at home with his wife.  He does not have any biological children but has a stepdaughter who is grown.  Patient still works as a Software engineer at [employer].  ADVANCE DIRECTIVES:  Patient has a living will and [facility] POA but both are not on file.  CODE STATUS: DNR (MOST form completed 11/22/18)  PAST MEDICAL HISTORY: Past Medical History:  Diagnosis Date  . Anorexia   . 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 09/15/2016 in setting of PFO.  . CVA (cerebral vascular accident) ([place]) 09/07/2016  . ED (erectile dysfunction)   . Essential (primary) hypertension 02/01/2016  . History of stroke 10/08/2016  . Hx of insomnia   . Hypertension   . OSA on CPAP   . PFO (patent foramen ovale)    a. s/p closure 09/2016 by Dr. [name].  . Pleural effusion on right 02/21/2018  . Primary cancer of right lower lobe of lung ([place]) 02/25/2018   Chemo tx's and rad tx's  . PVC's (premature ventricular contractions) 10/08/2016  . 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 TIA (transient ischemic attack) 09/07/2016    PAST SURGICAL HISTORY:  Past  Surgical History:  Procedure Laterality Date  . APPENDECTOMY  1980  . EYE SURGERY    . PATENT FORAMEN OVALE CLOSURE  10/07/2016  . PATENT FORAMEN OVALE CLOSURE N/A 10/07/2016   Procedure: Patent For[place](PFO) Closure;  Surgeon: [name], MD;  Location: [facility];  Service: Cardiovascular;  Laterality: N/A;  . PORTACATH PLACEMENT N/A 02/23/2018   Procedure: INSERTION PORT-A-CATH;  Surgeon: Nestor Lewandowsky, MD;  Location: [facility] OR[facility];  Service: Thoracic;  Laterality: N/A;  . STRABISMUS SURGERY Left 1984  . TEE WITHOUT CARDIOVERSION N/A 09/08/2016   Procedure: TRANSESOPHAGEAL ECHOCARDIOGRAM (TEE);  Surgeon: [name], MD;  Location: [facility] OR[facility];  Service: Cardiovascular;  Laterality: N/A;  . VIDEO ASSISTED THORACOSCOPY (VATS) W/TALC PLEUADESIS Right 02/23/2018   Procedure: VIDEO ASSISTED THORACOSCOPY (VATS) W/TALC PLEUADESIS;  Surgeon: Nestor Lewandowsky, MD;  Location: [facility] ORS;  Service: Thoracic;  Laterality: Right;  . VITRECTOMY Right 2013    HEMATOLOGY/ONCOLOGY HISTORY:  Oncology History   # June 2019- LUNG CA- HIGH GRADE CA with sarcomatoid features [EGFR MUTATED; exon 19; clinically non-small cell];Dr.Jeffrey Clark; II opinion at [facility];  # June 17th- carbo-taxol #1; PROGRESSION June 26th 2019- Osimertinib '80mg'$ /day; S/p palliative RT [facility]; June 28th, 2019]; reirradiation right chest wall finished radiation December 10th [Dr.Kinard]  # Oct 2019- Severe progression in chest; START carbo-Taxol-Tecentriq-Avastin x4 cycles; 10/09/2018- PR/?  Increasing infiltrate right upper lobe [?mixed response]; 1/16-Tecen-Avastin; CT- Jan 27th- Progressive right flank mass; last Tecen- Avastin-   #  FEB 27th 2020- Tax-Cyramza- 02/06.   # Repeat chest Bx- Nov 2019- high grade Non-small cell with sarcomatoid features  # Dec 3rd 2019- Left Lung PE- lovenox  # HTN/ cryptogenic stroke;  # MOLECULAR TESTING- EGFR exon 19 deletion [omniseq]; Oct 2019- Plasma/guadant tetsing- EGFR exon  19del*   DIAGNOSIS: non-small cell lung ca  STAGE:  IV  ;GOALS: palliative  CURRENT/MOST RECENT THERAPY- TAX-RAM      Primary cancer of right lower lobe of lung ([place])   11/22/2018 -  Chemotherapy    The patient had pegfilgrastim-cbqv (UDENYCA) injection 6 mg, 6 mg, Subcutaneous, Once, 1 of 6 cycles DOCEtaxel (TAXOTERE) 130 mg in sodium chloride 0.9 % 250 mL chemo infusion, 75 mg/m2 = 130 mg, Intravenous,  Once, 1 of 6 cycles Administration: 130 mg (11/22/2018) ramucirumab (CYRAMZA) 600 mg in sodium chloride 0.9 % 190 mL chemo infusion, 10 mg/kg = 600 mg, Intravenous, Once, 1 of 6 cycles Administration: 600 mg (11/22/2018)  for chemotherapy treatment.      ALLERGIES:  has No Known Allergies.  MEDICATIONS:  Current Outpatient Medications  Medication Sig Dispense Refill  . acetaminophen (TYLENOL) 325 MG tablet Take 2 tablets (650 mg total) by mouth every 6 (six) hours as needed for mild pain (or Fever >/= 101).    . B Complex-C (B-COMPLEX WITH VITAMIN C) tablet Take 1 tablet by mouth daily.    Marland Kitchen dexamethasone (DECADRON) 4 MG tablet Take 2 pills in AM and PM x 3 days; start the day prior to chemo 45 tablet 3  . dicyclomine (BENTYL) 10 MG capsule Take 1 capsule (10 mg total) by mouth 4 (four) times daily -  before meals and at bedtime. 40 capsule 3  . dronabinol (MARINOL) 5 MG capsule Take 1 capsule (5 mg total) by mouth 2 (two) times daily before a meal. 60 capsule 0  . DULoxetine (CYMBALTA) 30 MG capsule Take 1 capsule (30 mg total) by mouth daily. 30 capsule 3  . furosemide (LASIX) 20 MG tablet Take 1 tablet (20 mg total) by mouth daily as needed for edema. 30 tablet 6  . HYDROmorphone (DILAUDID) 2 MG tablet Take 1-2 tablets (2-4 mg total) by mouth every 4 (four) hours as needed for moderate pain or severe pain. 45 tablet 0  . ibuprofen (ADVIL,MOTRIN) 200 MG tablet Take 800 mg by mouth daily as needed for mild pain.    Marland Kitchen lidocaine-prilocaine (EMLA) cream Apply 1 application topically  as needed. Apply generously over the Mediport 45 minutes prior to chemotherapy. 30 g 0  . loratadine (CLARITIN) 10 MG tablet Take 10 mg by mouth daily as needed for allergies.     Marland Kitchen LORazepam (ATIVAN) 0.5 MG tablet Take 1 tablet (0.5 mg total) by mouth every 8 (eight) hours as needed for anxiety. 45 tablet 0  . metoprolol tartrate (LOPRESSOR) 25 MG tablet Take 1 tablet (25 mg total) by mouth 2 (two) times daily. 60 tablet 2  . morphine (MS CONTIN) 15 MG 12 hr tablet Take 2 tablets (30 mg total) by mouth every 12 (twelve) hours. 60 tablet 0  . Multiple Vitamin (MULTIVITAMIN WITH MINERALS) TABS tablet Take 1 tablet by mouth daily.    . rivaroxaban (XARELTO) 20 MG TABS tablet Take 1 tablet (20 mg total) by mouth daily with supper. 90 tablet 3  . tadalafil (CIALIS) 5 MG tablet Take 5 mg by mouth daily as needed for erectile dysfunction.    . triamcinolone (NASACORT) 55 MCG/ACT AERO nasal inhaler Place 2  sprays into the nose daily as needed (congestion).     . Vitamin D, Ergocalciferol, (DRISDOL) 50000 units CAPS capsule Take 1 capsule (50,000 Units total) by mouth every 7 (seven) days. (Patient taking differently: Take 50,000 Units by mouth every 7 (seven) days. Mondays) 12 capsule 0  . zolpidem (AMBIEN) 10 MG tablet Take 10 mg by mouth at bedtime as needed for sleep.      No current facility-administered medications for this visit.    Facility-Administered Medications Ordered in Other Visits  Medication Dose Route Frequency Provider Last Rate Last Dose  . sodium chloride flush (NS) 0.9 % injection 10 mL  10 mL Intracatheter PRN Cammie Sickle, MD   10 mL at 11/22/18 0830    VITAL SIGNS: There were no vitals taken for this visit. There were no vitals filed for this visit.  Estimated body mass index is 21.47 kg/m as calculated from the following:   Height as of 11/01/18: '5\' 6"'$  (1.676 m).   Weight as of an earlier encounter on 11/22/18: 133 lb (60.3 kg).  LABS: CBC:    Component Value  Date/Time   WBC 11.0 (H) 11/22/2018 0804   HGB 9.4 (L) 11/22/2018 0804   HGB 13.2 10/05/2016 0848   HCT 28.8 (L) 11/22/2018 0804   HCT 39.1 10/05/2016 0848   PLT 281 11/22/2018 0804   PLT 250 10/05/2016 0848   MCV 99.3 11/22/2018 0804   MCV 92 10/05/2016 0848   NEUTROABS 9.4 (H) 11/22/2018 0804   NEUTROABS 1.9 10/05/2016 0848   LYMPHSABS 0.5 (L) 11/22/2018 0804   LYMPHSABS 1.9 10/05/2016 0848   MONOABS 1.0 11/22/2018 0804   EOSABS 0.0 11/22/2018 0804   EOSABS 0.2 10/05/2016 0848   BASOSABS 0.0 11/22/2018 0804   BASOSABS 0.0 10/05/2016 0848   Comprehensive Metabolic Panel:    Component Value Date/Time   NA 133 (L) 11/22/2018 0804   NA 141 10/05/2016 0848   K 3.8 11/22/2018 0804   K 4.3 05/15/2013 0612   CL 99 11/22/2018 0804   CO2 25 11/22/2018 0804   BUN 20 11/22/2018 0804   BUN 17 10/05/2016 0848   CREATININE 0.63 11/22/2018 0804   GLUCOSE 127 (H) 11/22/2018 0804   CALCIUM 9.0 11/22/2018 0804   AST 63 (H) 11/22/2018 0804   ALT 64 (H) 11/22/2018 0804   ALKPHOS 150 (H) 11/22/2018 0804   BILITOT 0.3 11/22/2018 0804   PROT 6.1 (L) 11/22/2018 0804   ALBUMIN 2.5 (L) 11/22/2018 0804    RADIOGRAPHIC STUDIES: Ct Abdomen Pelvis W Contrast  Result Date: 10/24/2018 CLINICAL DATA:  Acute abdominal pain. Patient reports chest pain. Lung cancer with active chemotherapy. EXAM: CT ABDOMEN AND PELVIS WITH CONTRAST TECHNIQUE: Multidetector CT imaging of the abdomen and pelvis was performed using the standard protocol following bolus administration of intravenous contrast. CONTRAST:  134m ISOVUE-300 IOPAMIDOL (ISOVUE-300) INJECTION 61% COMPARISON:  Most recent comparison CT 12 days prior 10/12/2018 FINDINGS: Lower chest: Heterogeneous soft tissue pleural-based masses in the right lower hemithorax extends to the subcarinal region in causes anterior displacement of the heart and mediastinum. Associated posterior displacement of the descending aorta. Esophagus is difficult to delineate from  the large lesions. Pleural lesions extend inferiorly deep in the pleural space and efface the right hepatic lobe as before. Hepatobiliary: Mass-effect in the right hepatic lobe by pleural lesions. No discrete focal hepatic abnormality. Gallbladder physiologically distended, no calcified stone. No biliary dilatation. Pancreas: No ductal dilatation or inflammation. Spleen: Normal in size without focal abnormality. Adrenals/Urinary  Tract: No adrenal nodule. Right kidney displaced anteriorly by right pleural based mass. No hydronephrosis. Left kidney is unremarkable. Homogeneous renal enhancement with symmetric excretion on delayed phase imaging. Urinary bladder is physiologically distended without wall thickening. Stomach/Bowel: Bowel evaluation is limited in the absence of enteric contrast and paucity of intra-abdominal fat. No evidence of bowel obstruction or inflammation. Appendix not well visualized. Moderate colonic stool burden with stool distending the rectum. Vascular/Lymphatic: Normal caliber abdominal aorta. Portal vein and mesenteric vessels are patent. No bulky adenopathy, however lack of enteric contrast limits detailed assessment. Reproductive: Prostate is unremarkable. Other: Trace free fluid in the pelvis, nonspecific. No free air. No evidence of intra-abdominal abscess. Musculoskeletal: Soft tissue edema in the right flank subcutaneous tissues with thickening of the posterolateral abdominal wall musculature, likely secondary to tumor invasion by pleural based lesions. No focal osseous lesion. IMPRESSION: 1. No acute abnormality in the abdomen/pelvis. 2. Known pleural-based masses throughout the right hemithorax causing mass effect on the mediastinum and right upper quadrant, not significantly changed from CT 12 days ago. 3. Soft tissue edema in the right flank subcutaneous tissues with thickening of the posterolateral abdominal wall musculature, may be reactive secondary to chest/abdominal wall  invasion by pleural based lesions. Electronically Signed   By: Keith Rake M.D.   On: 10/24/2018 00:51   Dg Chest Portable 1 View  Result Date: 10/24/2018 CLINICAL DATA:  Chest pain, abdominal pain.  Lung cancer. EXAM: PORTABLE CHEST 1 VIEW COMPARISON:  Chest CT 10/09/2018 FINDINGS: Right Port-A-Cath remains in place, unchanged. Severe right pleural thickening and right lung airspace disease again noted as seen on recent chest CT, not significantly changed. Left lung clear. Heart is normal size. No acute bony abnormality. IMPRESSION: Right pleural thickening and diffuse right lung airspace disease, stable since recent CT. Electronically Signed   By: Rolm Baptise M.D.   On: 10/24/2018 00:00    PERFORMANCE STATUS (ECOG) : 1 - Symptomatic but completely ambulatory  Review of Systems As noted above. Otherwise, a complete review of systems is negative.  Physical Exam General: NAD, frail appearing, thin Cardiovascular: regular rate and rhythm Pulmonary: clear ant fields Abdomen: soft, nontender, + bowel sounds GU: no suprapubic tenderness Extremities: no edema, no joint deformities Skin: no rashes Neurological: Weakness but otherwise nonfocal  IMPRESSION: Patient seen for routine follow-up.  Patient is a symptomatically, he is feeling much improved on current pain regimen.  He says pain is stable averaging 2-5 out of 10.  He is taking the hydromorphone 2-3 times a day for breakthrough pain.  He denies adverse effects from the pain medication.  He has some constipation but says it is manageable on current bowel regimen.   I completed a MOST form today. The patient and family outlined their wishes for the following treatment decisions:  Cardiopulmonary Resuscitation: Do Not Attempt Resuscitation (DNR/No CPR)  Medical Interventions: Limited Additional Interventions: Use medical treatment, IV fluids and cardiac monitoring as indicated, DO NOT USE intubation or mechanical ventilation. May  consider use of less invasive airway support such as BiPAP or CPAP. Also provide comfort measures. Transfer to the hospital if indicated. Avoid intensive care.   Antibiotics: Antibiotics if indicated  IV Fluids: IV fluids if indicated  Feeding Tube: No feeding tube   [place] CSRS reviewed.  PLAN: 1.  Continue current scope of treatment 2.  Continue MS Contin 30 mg every 12 hours (Rx #60) 3.  Continue hydromorphone 2 to 4 mg every 4 hours as needed for breakthrough pain (  Rx #45) 4.  Continue bowel regimen 5.  DNR/MOST form completed as outlined above 6.  RTC in 3 weeks or sooner if needed  Patient expressed understanding and was in agreement with this plan. He also understands that He can call clinic at any time with any questions, concerns, or complaints.     Time Total: 45 minutes  Visit consisted of counseling and education dealing with the complex and emotionally intense issues of symptom management and palliative care in the setting of serious and potentially life-threatening illness.Greater than 50%  of this time was spent counseling and coordinating care related to the above assessment and plan.  Signed by: Altha Harm, PhD, NP-C [phone] (Work Cell)

## 2018-11-22 NOTE — Assessment & Plan Note (Addendum)
#  Non-small cell lung cancer/stage IV- Currently on Tecentriq-Avastin maintenance. October 09, 2018 CT scan-slight improvement of the right lung malignancy; however slightly increased infiltrative changes noted in the right upper lung.  Concerns for clinical progression/worsening.  # Recommend proceeding with Taxotere-Cyramza. #1 today. Labs today reviewed;  acceptable for treatment today except for mildly elevated LFTs.   # Elevated LFTs- ? Malignancy [less likley] vs treatment induced; vs tylenol [2gm/day].  Avoid Tylenol.  Will recheck labs in 10 days.  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  Currently on MS Contin 30 BID; dilaudid 2mg  every 4 hours prn. [2-4/day].  Continue follow-up with palliative care.  # Growth factor-udenyca would be given as prophylaxis for chemotherapy-induced neutropenia to prevent febrile neutropenias. Discussed potential side effect- myalgias/arthralgias- recommend Claritin for 4 days.   #Left lingular/segmental PE-on Xarelto- STABLE.   # Malnutrition Albumin-2.8- STABLE.   # DISPOSITION # Tax-Ram today; udenyca tomorrow. # 10 days- labs- cbc/cmp/; possible IVFs/ over 1 hour # follow up in 3 weeks-MD;labs-cbc/cmp/UA -Taxoeter-Ram; D-2 udenyca-Dr.B

## 2018-11-22 NOTE — Progress Notes (Signed)
Ruben Eaton OFFICE PROGRESS NOTE  Patient Care Team: Dion Body, MD as PCP - General (Family Medicine) [name], MD as PCP - Cardiology (Cardiology) Telford Nab, RN as Registered Nurse  Cancer Staging Primary cancer of right lower lobe of lung [facility]) Staging form: Lung, AJCC 8th Edition - Clinical stage from 03/02/2018: Stage IV (cT4, cN0, pM1a) - Unsigned    Oncology History   # June 2019- LUNG CA- HIGH GRADE CA with sarcomatoid features [EGFR MUTATED; exon 19; clinically non-small cell];Dr.Jeffrey Clark; II opinion at [facility];  # June 17th- carbo-taxol #1; PROGRESSION June 26th 2019- Osimertinib '80mg'$ /day; S/p palliative RT [facility]; June 28th, 2019]; reirradiation right chest wall finished radiation December 10th [Dr.Kinard]  # Oct 2019- Severe progression in chest; START carbo-Taxol-Tecentriq-Avastin x4 cycles; 10/09/2018- PR/?  Increasing infiltrate right upper lobe [?mixed response]; 1/16-Tecen-Avastin  # Repeat chest Bx- Nov 2019- high grade Non-small cell with sarcomatoid features  # Dec 3rd 2019- Left Lung PE- lovenox  # HTN/ cryptogenic stroke;  # MOLECULAR TESTING- EGFR exon 19 deletion [omniseq]; Oct 2019- Plasma/guadant tetsing- EGFR exon 19del*   DIAGNOSIS: non-small cell lung ca  STAGE:  IV  ;GOALS: palliative  CURRENT/MOST RECENT THERAPY- Carbo-Taxol-Avastin-Tecentriq      Primary cancer of right lower lobe of lung ([place])   11/22/2018 -  Chemotherapy    The patient had pegfilgrastim-cbqv (UDENYCA) injection 6 mg, 6 mg, Subcutaneous, Once, 1 of 6 cycles DOCEtaxel (TAXOTERE) 130 mg in sodium chloride 0.9 % 250 mL chemo infusion, 75 mg/m2 = 130 mg, Intravenous,  Once, 1 of 6 cycles ramucirumab (CYRAMZA) 600 mg in sodium chloride 0.9 % 190 mL chemo infusion, 10 mg/kg = 600 mg, Intravenous, Once, 1 of 6 cycles  for chemotherapy treatment.        INTERVAL HISTORY:  Ruben Eaton 48 y.o.  male pleasant patient above history of  metastatic adenocarcinoma the lung/EGFR mutated is currently Tecentriq-Avastin maintenance is here for follow-up.  Patient states his pain is better controlled on MS Contin 30 twice daily and Dilaudid up to 4 pills a day.  Is also taking Tylenol up to 2 g a day along with Advil.  His appetite is fair.  No nausea no vomiting.  Chronic shortness of breath chronic cough not worse.  Continues to have fatigue.  No weight loss.  He does not notice his right posterior swelling getting any better.   Review of Systems  Constitutional: Positive for malaise/fatigue. Negative for chills, diaphoresis, fever and weight loss.  HENT: Negative for nosebleeds and sore throat.   Eyes: Negative for double vision.  Respiratory: Positive for cough and shortness of breath. Negative for hemoptysis, sputum production and wheezing.   Cardiovascular: Positive for chest pain (Right chest wall pain.). Negative for palpitations, orthopnea and leg swelling.  Gastrointestinal: Negative for blood in stool, constipation, heartburn, melena, nausea and vomiting.  Genitourinary: Positive for flank pain. Negative for dysuria, frequency and urgency.  Musculoskeletal: Positive for back pain and joint pain.  Skin: Negative.  Negative for itching and rash.  Neurological: Positive for tingling. Negative for dizziness, focal weakness, weakness and headaches.  Endo/Heme/Allergies: Does not bruise/bleed easily.  Psychiatric/Behavioral: Negative for depression. The patient is not nervous/anxious and does not have insomnia.       PAST MEDICAL HISTORY :  Past Medical History:  Diagnosis Date  . Anorexia   . Cryptogenic stroke ([place]) 08/2016   a. 09/15/2016 in setting of PFO.  . CVA (cerebral vascular accident) ([place]) 09/07/2016  .  ED (erectile dysfunction)   . Essential (primary) hypertension 02/01/2016  . History of stroke 10/08/2016  . Hx of insomnia   . Hypertension   . OSA on CPAP   . PFO (patent foramen ovale)    a. s/p  closure 09/2016 by Dr. [name].  . Pleural effusion on right 02/21/2018  . Primary cancer of right lower lobe of lung ([place]) 02/25/2018   Chemo tx's and rad tx's  . PVC's (premature ventricular contractions) 10/08/2016  . 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 TIA (transient ischemic attack) 09/07/2016    PAST SURGICAL HISTORY :   Past Surgical History:  Procedure Laterality Date  . APPENDECTOMY  1980  . EYE SURGERY    . PATENT FORAMEN OVALE CLOSURE  10/07/2016  . PATENT FORAMEN OVALE CLOSURE N/A 10/07/2016   Procedure: Patent For[place](PFO) Closure;  Surgeon: [name], MD;  Location: [facility];  Service: Cardiovascular;  Laterality: N/A;  . PORTACATH PLACEMENT N/A 02/23/2018   Procedure: INSERTION PORT-A-CATH;  Surgeon: Nestor Lewandowsky, MD;  Location: [facility];  Service: Thoracic;  Laterality: N/A;  . STRABISMUS SURGERY Left 1984  . TEE WITHOUT CARDIOVERSION N/A 09/08/2016   Procedure: TRANSESOPHAGEAL ECHOCARDIOGRAM (TEE);  Surgeon: [name], MD;  Location: [facility];  Service: Cardiovascular;  Laterality: N/A;  . VIDEO ASSISTED THORACOSCOPY (VATS) W/TALC PLEUADESIS Right 02/23/2018   Procedure: VIDEO ASSISTED THORACOSCOPY (VATS) W/TALC PLEUADESIS;  Surgeon: Nestor Lewandowsky, MD;  Location: [facility];  Service: Thoracic;  Laterality: Right;  . VITRECTOMY Right 2013    FAMILY HISTORY :   Family History  Problem Relation Age of Onset  . Other Paternal Uncle        Abdominal tumor     SOCIAL HISTORY:   Social History   Tobacco Use  . Smoking status: Former Smoker    Packs/day: 0.50    Years: 10.00    Pack years: 5.00    Last attempt to quit: 2011    Years since quitting: 9.1  . Smokeless tobacco: Former Systems developer    Types: Snuff  Substance Use Topics  . Alcohol use: Yes    Alcohol/week: 3.0 standard drinks    Types: 3 Cans of beer per week  . Drug use: No    ALLERGIES:  has No Known Allergies.  MEDICATIONS:  Current Outpatient Medications   Medication Sig Dispense Refill  . acetaminophen (TYLENOL) 325 MG tablet Take 2 tablets (650 mg total) by mouth every 6 (six) hours as needed for mild pain (or Fever >/= 101).    . B Complex-C (B-COMPLEX WITH VITAMIN C) tablet Take 1 tablet by mouth daily.    Marland Kitchen dexamethasone (DECADRON) 4 MG tablet Take 2 pills in AM and PM x 3 days; start the day prior to chemo 45 tablet 3  . dicyclomine (BENTYL) 10 MG capsule Take 1 capsule (10 mg total) by mouth 4 (four) times daily -  before meals and at bedtime. 40 capsule 3  . dronabinol (MARINOL) 5 MG capsule Take 1 capsule (5 mg total) by mouth 2 (two) times daily before a meal. 60 capsule 0  . DULoxetine (CYMBALTA) 30 MG capsule Take 1 capsule (30 mg total) by mouth daily. 30 capsule 3  . furosemide (LASIX) 20 MG tablet Take 1 tablet (20 mg total) by mouth daily as needed for edema. 30 tablet 6  . HYDROmorphone (DILAUDID) 2 MG tablet Take 1-2 tablets (2-4 mg total) by mouth every 4 (four) hours as needed  for moderate pain or severe pain. 45 tablet 0  . ibuprofen (ADVIL,MOTRIN) 200 MG tablet Take 800 mg by mouth daily as needed for mild pain.    Marland Kitchen lidocaine-prilocaine (EMLA) cream Apply 1 application topically as needed. Apply generously over the Mediport 45 minutes prior to chemotherapy. 30 g 0  . loratadine (CLARITIN) 10 MG tablet Take 10 mg by mouth daily as needed for allergies.     Marland Kitchen LORazepam (ATIVAN) 0.5 MG tablet Take 1 tablet (0.5 mg total) by mouth every 8 (eight) hours as needed for anxiety. 45 tablet 0  . metoprolol tartrate (LOPRESSOR) 25 MG tablet Take 1 tablet (25 mg total) by mouth 2 (two) times daily. 60 tablet 2  . morphine (MS CONTIN) 15 MG 12 hr tablet Take 2 tablets (30 mg total) by mouth every 12 (twelve) hours. 60 tablet 0  . Multiple Vitamin (MULTIVITAMIN WITH MINERALS) TABS tablet Take 1 tablet by mouth daily.    . rivaroxaban (XARELTO) 20 MG TABS tablet Take 1 tablet (20 mg total) by mouth daily with supper. 90 tablet 3  . tadalafil  (CIALIS) 5 MG tablet Take 5 mg by mouth daily as needed for erectile dysfunction.    . triamcinolone (NASACORT) 55 MCG/ACT AERO nasal inhaler Place 2 sprays into the nose daily as needed (congestion).     . Vitamin D, Ergocalciferol, (DRISDOL) 50000 units CAPS capsule Take 1 capsule (50,000 Units total) by mouth every 7 (seven) days. (Patient taking differently: Take 50,000 Units by mouth every 7 (seven) days. Mondays) 12 capsule 0  . zolpidem (AMBIEN) 10 MG tablet Take 10 mg by mouth at bedtime as needed for sleep.      No current facility-administered medications for this visit.    Facility-Administered Medications Ordered in Other Visits  Medication Dose Route Frequency Provider Last Rate Last Dose  . dexamethasone (DECADRON) injection 10 mg  10 mg Intravenous Once Charlaine Dalton R, MD      . diphenhydrAMINE (BENADRYL) injection 50 mg  50 mg Intravenous Once Charlaine Dalton R, MD      . DOCEtaxel (TAXOTERE) 130 mg in sodium chloride 0.9 % 250 mL chemo infusion  75 mg/m2 (Treatment Plan Recorded) Intravenous Once Charlaine Dalton R, MD      . heparin lock flush 100 unit/mL  500 Units Intracatheter Once PRN Cammie Sickle, MD      . ramucirumab (CYRAMZA) 600 mg in sodium chloride 0.9 % 190 mL chemo infusion  10 mg/kg (Treatment Plan Recorded) Intravenous Once Charlaine Dalton R, MD      . sodium chloride flush (NS) 0.9 % injection 10 mL  10 mL Intracatheter PRN Cammie Sickle, MD   10 mL at 11/22/18 0830    PHYSICAL EXAMINATION: ECOG PERFORMANCE STATUS: 1 - Symptomatic but completely ambulatory  BP 121/82 (BP Location: Left Arm, Patient Position: Sitting, Cuff Size: Normal)   Pulse 88   Temp (!) 97.4 F (36.3 C) (Tympanic)   Resp 16   Wt 133 lb (60.3 kg)   BMI 21.47 kg/m   Filed Weights   11/22/18 0849  Weight: 133 lb (60.3 kg)    Physical Exam  Constitutional: He is oriented to person, place, and time.  He is accompanied by his wife.  He appears  cachectic.  He is walking himself.  Not on oxygen.  HENT:  Head: Normocephalic and atraumatic.  Mouth/Throat: Oropharynx is clear and moist. No oropharyngeal exudate.  Eyes: Pupils are equal, round, and reactive to light.  Neck: Normal range of motion. Neck supple.  Cardiovascular: Normal rate and regular rhythm.  Pulmonary/Chest: No respiratory distress. He has no wheezes.  Decreased breath sounds right side most of the bases.  Right chest wall approximately; approximately 2 to 3 cm in size (improved)  Posteriorly mid scapular region-approximately 3 to 4 cm cm lump noted/ new.  Increasing  Abdominal: Soft. Bowel sounds are normal. He exhibits no distension and no mass. There is no abdominal tenderness. There is no rebound and no guarding.  Musculoskeletal: Normal range of motion.        General: No tenderness or edema.  Neurological: He is alert and oriented to person, place, and time.  Skin: Skin is warm.  Psychiatric: Affect normal.       LABORATORY DATA:  I have reviewed the data as listed    Component Value Date/Time   NA 133 (L) 11/22/2018 0804   NA 141 10/05/2016 0848   K 3.8 11/22/2018 0804   K 4.3 05/15/2013 0612   CL 99 11/22/2018 0804   CO2 25 11/22/2018 0804   GLUCOSE 127 (H) 11/22/2018 0804   BUN 20 11/22/2018 0804   BUN 17 10/05/2016 0848   CREATININE 0.63 11/22/2018 0804   CALCIUM 9.0 11/22/2018 0804   PROT 6.1 (L) 11/22/2018 0804   ALBUMIN 2.5 (L) 11/22/2018 0804   AST 63 (H) 11/22/2018 0804   ALT 64 (H) 11/22/2018 0804   ALKPHOS 150 (H) 11/22/2018 0804   BILITOT 0.3 11/22/2018 0804   GFRNONAA >60 11/22/2018 0804   GFRAA >60 11/22/2018 0804    No results found for: SPEP, UPEP  Lab Results  Component Value Date   WBC 11.0 (H) 11/22/2018   NEUTROABS 9.4 (H) 11/22/2018   HGB 9.4 (L) 11/22/2018   HCT 28.8 (L) 11/22/2018   MCV 99.3 11/22/2018   PLT 281 11/22/2018      Chemistry      Component Value Date/Time   NA 133 (L) 11/22/2018 0804   NA  141 10/05/2016 0848   K 3.8 11/22/2018 0804   K 4.3 05/15/2013 0612   CL 99 11/22/2018 0804   CO2 25 11/22/2018 0804   BUN 20 11/22/2018 0804   BUN 17 10/05/2016 0848   CREATININE 0.63 11/22/2018 0804      Component Value Date/Time   CALCIUM 9.0 11/22/2018 0804   ALKPHOS 150 (H) 11/22/2018 0804   AST 63 (H) 11/22/2018 0804   ALT 64 (H) 11/22/2018 0804   BILITOT 0.3 11/22/2018 0804       RADIOGRAPHIC STUDIES: I have personally reviewed the radiological images as listed and agreed with the findings in the report. No results found.   ASSESSMENT & PLAN:  Primary cancer of right lower lobe of lung ([place]) #Non-small cell lung cancer/stage IV- Currently on Tecentriq-Avastin maintenance. October 09, 2018 CT scan-slight improvement of the right lung malignancy; however slightly increased infiltrative changes noted in the right upper lung.  Concerns for clinical progression/worsening.  # Recommend proceeding with Taxotere-Cyramza. #1 today. Labs today reviewed;  acceptable for treatment today except for mildly elevated LFTs.   # Elevated LFTs- ? Malignancy [less likley] vs treatment induced; vs tylenol [2gm/day].  Avoid Tylenol.  Will recheck labs in 10 days.  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  Currently on MS Contin 30 BID; dilaudid '2mg'$  every 4 hours prn. [2-4/day].  Continue follow-up with palliative care.  # Growth factor-udenyca would be given as prophylaxis for  chemotherapy-induced neutropenia to prevent febrile neutropenias. Discussed potential side effect- myalgias/arthralgias- recommend Claritin for 4 days.   #Left lingular/segmental PE-on Xarelto- STABLE.   # Malnutrition Albumin-2.8- STABLE.   # DISPOSITION # Tax-Ram today; udenyca tomorrow. # 10 days- labs- cbc/cmp/; possible IVFs/ over 1 hour # follow up in 3 weeks-MD;labs-cbc/cmp/UA -Taxoeter-Ram; D-2 udenyca-Dr.B     Orders Placed This  Encounter  Procedures  . CBC with Differential    Standing Status:   Future    Standing Expiration Date:   11/23/2019  . Comprehensive metabolic panel    Standing Status:   Future    Standing Expiration Date:   11/23/2019  . CBC with Differential/Platelet    Standing Status:   Future    Standing Expiration Date:   11/23/2019  . Comprehensive metabolic panel    Standing Status:   Future    Standing Expiration Date:   11/23/2019  . CBC with Differential/Platelet    Standing Status:   Future    Standing Expiration Date:   11/23/2019  . Comprehensive metabolic panel    Standing Status:   Future    Standing Expiration Date:   11/23/2019  . Urinalysis, Complete w Microscopic    Standing Status:   Future    Standing Expiration Date:   11/23/2019   All questions were answered. The patient knows to call the clinic with any problems, questions or concerns.      Cammie Sickle, MD 11/22/2018 9:39 AM

## 2018-11-23 ENCOUNTER — Inpatient Hospital Stay: Payer: 59 | Attending: Internal Medicine

## 2018-11-23 DIAGNOSIS — Z7189 Other specified counseling: Secondary | ICD-10-CM

## 2018-11-23 DIAGNOSIS — C3431 Malignant neoplasm of lower lobe, right bronchus or lung: Secondary | ICD-10-CM | POA: Diagnosis not present

## 2018-11-23 DIAGNOSIS — Z7689 Persons encountering health services in other specified circumstances: Secondary | ICD-10-CM | POA: Insufficient documentation

## 2018-11-23 MED ORDER — PEGFILGRASTIM-CBQV 6 MG/0.6ML ~~LOC~~ SOSY
6.0000 mg | PREFILLED_SYRINGE | Freq: Once | SUBCUTANEOUS | Status: AC
  Administered 2018-11-23: 6 mg via SUBCUTANEOUS

## 2018-12-01 ENCOUNTER — Other Ambulatory Visit: Payer: Self-pay

## 2018-12-01 ENCOUNTER — Inpatient Hospital Stay
Admission: EM | Admit: 2018-12-01 | Discharge: 2018-12-26 | DRG: 871 | Disposition: E | Payer: 59 | Attending: Specialist | Admitting: Specialist

## 2018-12-01 ENCOUNTER — Emergency Department: Payer: 59

## 2018-12-01 ENCOUNTER — Encounter: Payer: Self-pay | Admitting: Emergency Medicine

## 2018-12-01 DIAGNOSIS — R600 Localized edema: Secondary | ICD-10-CM | POA: Diagnosis not present

## 2018-12-01 DIAGNOSIS — Z8673 Personal history of transient ischemic attack (TIA), and cerebral infarction without residual deficits: Secondary | ICD-10-CM

## 2018-12-01 DIAGNOSIS — E43 Unspecified severe protein-calorie malnutrition: Secondary | ICD-10-CM | POA: Diagnosis not present

## 2018-12-01 DIAGNOSIS — G9340 Encephalopathy, unspecified: Secondary | ICD-10-CM | POA: Diagnosis not present

## 2018-12-01 DIAGNOSIS — Z87891 Personal history of nicotine dependence: Secondary | ICD-10-CM | POA: Diagnosis not present

## 2018-12-01 DIAGNOSIS — Z9981 Dependence on supplemental oxygen: Secondary | ICD-10-CM

## 2018-12-01 DIAGNOSIS — R64 Cachexia: Secondary | ICD-10-CM | POA: Diagnosis present

## 2018-12-01 DIAGNOSIS — R000 Tachycardia, unspecified: Secondary | ICD-10-CM | POA: Diagnosis not present

## 2018-12-01 DIAGNOSIS — G4733 Obstructive sleep apnea (adult) (pediatric): Secondary | ICD-10-CM | POA: Diagnosis present

## 2018-12-01 DIAGNOSIS — G936 Cerebral edema: Secondary | ICD-10-CM | POA: Diagnosis not present

## 2018-12-01 DIAGNOSIS — E872 Acidosis: Secondary | ICD-10-CM | POA: Diagnosis not present

## 2018-12-01 DIAGNOSIS — C3431 Malignant neoplasm of lower lobe, right bronchus or lung: Secondary | ICD-10-CM | POA: Diagnosis present

## 2018-12-01 DIAGNOSIS — J189 Pneumonia, unspecified organism: Secondary | ICD-10-CM | POA: Diagnosis not present

## 2018-12-01 DIAGNOSIS — I10 Essential (primary) hypertension: Secondary | ICD-10-CM | POA: Diagnosis not present

## 2018-12-01 DIAGNOSIS — D6959 Other secondary thrombocytopenia: Secondary | ICD-10-CM | POA: Diagnosis present

## 2018-12-01 DIAGNOSIS — Z66 Do not resuscitate: Secondary | ICD-10-CM | POA: Diagnosis not present

## 2018-12-01 DIAGNOSIS — Z515 Encounter for palliative care: Secondary | ICD-10-CM | POA: Diagnosis not present

## 2018-12-01 DIAGNOSIS — Z7951 Long term (current) use of inhaled steroids: Secondary | ICD-10-CM | POA: Diagnosis not present

## 2018-12-01 DIAGNOSIS — Y95 Nosocomial condition: Secondary | ICD-10-CM | POA: Diagnosis present

## 2018-12-01 DIAGNOSIS — C7931 Secondary malignant neoplasm of brain: Secondary | ICD-10-CM | POA: Diagnosis present

## 2018-12-01 DIAGNOSIS — D6481 Anemia due to antineoplastic chemotherapy: Secondary | ICD-10-CM | POA: Diagnosis present

## 2018-12-01 DIAGNOSIS — Z7901 Long term (current) use of anticoagulants: Secondary | ICD-10-CM | POA: Diagnosis not present

## 2018-12-01 DIAGNOSIS — M7989 Other specified soft tissue disorders: Secondary | ICD-10-CM

## 2018-12-01 DIAGNOSIS — E871 Hypo-osmolality and hyponatremia: Secondary | ICD-10-CM | POA: Diagnosis not present

## 2018-12-01 DIAGNOSIS — E7800 Pure hypercholesterolemia, unspecified: Secondary | ICD-10-CM | POA: Diagnosis present

## 2018-12-01 DIAGNOSIS — I82621 Acute embolism and thrombosis of deep veins of right upper extremity: Secondary | ICD-10-CM | POA: Diagnosis present

## 2018-12-01 DIAGNOSIS — F419 Anxiety disorder, unspecified: Secondary | ICD-10-CM | POA: Diagnosis present

## 2018-12-01 DIAGNOSIS — D696 Thrombocytopenia, unspecified: Secondary | ICD-10-CM | POA: Diagnosis not present

## 2018-12-01 DIAGNOSIS — Z923 Personal history of irradiation: Secondary | ICD-10-CM | POA: Diagnosis not present

## 2018-12-01 DIAGNOSIS — R0602 Shortness of breath: Secondary | ICD-10-CM | POA: Diagnosis not present

## 2018-12-01 DIAGNOSIS — I639 Cerebral infarction, unspecified: Secondary | ICD-10-CM | POA: Diagnosis not present

## 2018-12-01 DIAGNOSIS — Z86711 Personal history of pulmonary embolism: Secondary | ICD-10-CM

## 2018-12-01 DIAGNOSIS — A419 Sepsis, unspecified organism: Principal | ICD-10-CM | POA: Diagnosis present

## 2018-12-01 DIAGNOSIS — C3490 Malignant neoplasm of unspecified part of unspecified bronchus or lung: Secondary | ICD-10-CM | POA: Diagnosis not present

## 2018-12-01 DIAGNOSIS — D72829 Elevated white blood cell count, unspecified: Secondary | ICD-10-CM | POA: Diagnosis not present

## 2018-12-01 DIAGNOSIS — R918 Other nonspecific abnormal finding of lung field: Secondary | ICD-10-CM | POA: Diagnosis not present

## 2018-12-01 DIAGNOSIS — D649 Anemia, unspecified: Secondary | ICD-10-CM | POA: Diagnosis not present

## 2018-12-01 DIAGNOSIS — C3491 Malignant neoplasm of unspecified part of right bronchus or lung: Secondary | ICD-10-CM | POA: Diagnosis not present

## 2018-12-01 DIAGNOSIS — R5081 Fever presenting with conditions classified elsewhere: Secondary | ICD-10-CM | POA: Diagnosis not present

## 2018-12-01 DIAGNOSIS — G893 Neoplasm related pain (acute) (chronic): Secondary | ICD-10-CM

## 2018-12-01 DIAGNOSIS — Z6821 Body mass index (BMI) 21.0-21.9, adult: Secondary | ICD-10-CM

## 2018-12-01 DIAGNOSIS — E876 Hypokalemia: Secondary | ICD-10-CM | POA: Diagnosis not present

## 2018-12-01 DIAGNOSIS — T451X5A Adverse effect of antineoplastic and immunosuppressive drugs, initial encounter: Secondary | ICD-10-CM | POA: Diagnosis present

## 2018-12-01 LAB — CBC WITH DIFFERENTIAL/PLATELET
Abs Immature Granulocytes: 0.17 10*3/uL — ABNORMAL HIGH (ref 0.00–0.07)
Basophils Absolute: 0.1 10*3/uL (ref 0.0–0.1)
Basophils Relative: 1 %
Eosinophils Absolute: 0 10*3/uL (ref 0.0–0.5)
Eosinophils Relative: 0 %
HCT: 32.2 % — ABNORMAL LOW (ref 39.0–52.0)
Hemoglobin: 10.7 g/dL — ABNORMAL LOW (ref 13.0–17.0)
Immature Granulocytes: 2 %
Lymphocytes Relative: 4 %
Lymphs Abs: 0.5 10*3/uL — ABNORMAL LOW (ref 0.7–4.0)
MCH: 31.8 pg (ref 26.0–34.0)
MCHC: 33.2 g/dL (ref 30.0–36.0)
MCV: 95.8 fL (ref 80.0–100.0)
Monocytes Absolute: 0.5 10*3/uL (ref 0.1–1.0)
Monocytes Relative: 5 %
Neutro Abs: 9.6 10*3/uL — ABNORMAL HIGH (ref 1.7–7.7)
Neutrophils Relative %: 88 %
Platelets: 118 10*3/uL — ABNORMAL LOW (ref 150–400)
RBC: 3.36 MIL/uL — ABNORMAL LOW (ref 4.22–5.81)
RDW: 14 % (ref 11.5–15.5)
WBC: 10.8 10*3/uL — ABNORMAL HIGH (ref 4.0–10.5)
nRBC: 0 % (ref 0.0–0.2)

## 2018-12-01 LAB — COMPREHENSIVE METABOLIC PANEL
ALT: 17 U/L (ref 0–44)
AST: 15 U/L (ref 15–41)
Albumin: 2.6 g/dL — ABNORMAL LOW (ref 3.5–5.0)
Alkaline Phosphatase: 146 U/L — ABNORMAL HIGH (ref 38–126)
Anion gap: 10 (ref 5–15)
BUN: 12 mg/dL (ref 6–20)
CO2: 26 mmol/L (ref 22–32)
Calcium: 8.8 mg/dL — ABNORMAL LOW (ref 8.9–10.3)
Chloride: 94 mmol/L — ABNORMAL LOW (ref 98–111)
Creatinine, Ser: 0.57 mg/dL — ABNORMAL LOW (ref 0.61–1.24)
GFR calc Af Amer: >60 mL/min (ref >60)
GFR calc non Af Amer: >60 mL/min (ref >60)
GLUCOSE: 88 mg/dL (ref 70–99)
Potassium: 3.8 mmol/L (ref 3.5–5.1)
Sodium: 130 mmol/L — ABNORMAL LOW (ref 135–145)
TOTAL PROTEIN: 6.2 g/dL — AB (ref 6.5–8.1)
Total Bilirubin: 0.9 mg/dL (ref 0.3–1.2)

## 2018-12-01 LAB — INFLUENZA PANEL BY PCR (TYPE A & B)
Influenza A By PCR: NEGATIVE
Influenza B By PCR: NEGATIVE

## 2018-12-01 LAB — LACTIC ACID, PLASMA: Lactic Acid, Venous: 2.7 mmol/L (ref 0.5–1.9)

## 2018-12-01 LAB — LIPASE, BLOOD: Lipase: 15 U/L (ref 11–51)

## 2018-12-01 MED ORDER — VANCOMYCIN HCL IN DEXTROSE 1-5 GM/200ML-% IV SOLN: 1000.0000 mg | Freq: Once | INTRAVENOUS | Status: DC

## 2018-12-01 MED ORDER — SODIUM CHLORIDE 0.9 % IV SOLN
2.0000 g | Freq: Once | INTRAVENOUS | Status: AC
  Administered 2018-12-01: 2 g via INTRAVENOUS
  Filled 2018-12-01: qty 2

## 2018-12-01 MED ORDER — SODIUM CHLORIDE 0.9 % IV BOLUS (SEPSIS)
1000.0000 mL | Freq: Once | INTRAVENOUS | Status: AC
  Administered 2018-12-01: 1000 mL via INTRAVENOUS

## 2018-12-01 MED ORDER — SODIUM CHLORIDE 0.9 % IV BOLUS (SEPSIS)
1000.0000 mL | Freq: Once | INTRAVENOUS | Status: AC
  Administered 2018-12-01 (×2): 1000 mL via INTRAVENOUS

## 2018-12-01 MED ORDER — METRONIDAZOLE IN NACL 5-0.79 MG/ML-% IV SOLN
500.0000 mg | Freq: Three times a day (TID) | INTRAVENOUS | Status: DC
  Administered 2018-12-01 – 2018-12-02 (×2): 500 mg via INTRAVENOUS
  Filled 2018-12-01 (×4): qty 100

## 2018-12-01 MED ORDER — VANCOMYCIN HCL 10 G IV SOLR
1500.0000 mg | Freq: Once | INTRAVENOUS | Status: AC
  Administered 2018-12-01: 1500 mg via INTRAVENOUS
  Filled 2018-12-01: qty 1500

## 2018-12-01 NOTE — ED Triage Notes (Signed)
Pt currently receiving chemo treatment for lung CA-last treatment was 2 weeks ago; pt reports fever since he woke this afternoon as well as worsening shortness of breath; temp at home 101.4-no meds given; -pt talking in complete coherent sentences;

## 2018-12-01 NOTE — ED Triage Notes (Signed)
FIRST NURSE NOTE-pt is chemo pt with fever. Skin feels hot.  Pulled for triage. Mask on.

## 2018-12-01 NOTE — ED Notes (Signed)
Report given to Noel RN 

## 2018-12-01 NOTE — Progress Notes (Signed)
CODE SEPSIS - PHARMACY COMMUNICATION  **Broad Spectrum Antibiotics should be administered within 1 hour of Sepsis diagnosis**  Time Code Sepsis Called/Page Received: 2106  Antibiotics Ordered: cefepime and vancomycin  Time of 1st antibiotic administration: 2146   Oswald Hillock ,PharmD, BCPS Clinical Pharmacist  12/10/2018  9:52 PM

## 2018-12-01 NOTE — H&P (Signed)
[facility] [place] NAME: Ruben Eaton    MR#:  [id number]  DATE OF BIRTH:  03/14/1971  DATE OF ADMISSION:  12/05/2018  PRIMARY CARE PHYSICIAN: Dion Body, MD   REQUESTING/REFERRING PHYSICIAN: Archie Balboa, MD  CHIEF COMPLAINT:   Chief Complaint  Patient presents with  . Code Sepsis  . Shortness of Breath  . Fever    HISTORY OF PRESENT ILLNESS:  Ruben Eaton  is a 48 y.o. male who presents with chief complaint as above.  Patient presents to the ED with complaint of 24 hours of progressive shortness of breath, increased cough, fever.  He has right-sided lung cancer and is currently on chemotherapy.  Work-up in the ED shows likely right-sided pneumonia, and the patient meets sepsis criteria.  Sepsis protocol was followed in the ED and hospitalist were called for admission  PAST MEDICAL HISTORY:   Past Medical History:  Diagnosis Date  . Anorexia   . 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 09/15/2016 in setting of PFO.  . CVA (cerebral vascular accident) ([place]) 09/07/2016  . ED (erectile dysfunction)   . Essential (primary) hypertension 02/01/2016  . History of stroke 10/08/2016  . Hx of insomnia   . Hypertension   . OSA on CPAP   . PFO (patent foramen ovale)    a. s/p closure 09/2016 by Dr. [name].  . Pleural effusion on right 02/21/2018  . Primary cancer of right lower lobe of lung ([place]) 02/25/2018   Chemo tx's and rad tx's  . PVC's (premature ventricular contractions) 10/08/2016  . 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 TIA (transient ischemic attack) 09/07/2016     PAST SURGICAL HISTORY:   Past Surgical History:  Procedure Laterality Date  . APPENDECTOMY  1980  . EYE SURGERY    . PATENT FORAMEN OVALE CLOSURE  10/07/2016  . PATENT FORAMEN OVALE CLOSURE N/A 10/07/2016   Procedure: Patent [place](PFO) Closure;  Surgeon: [name], MD;  Location: [facility];  Service:  Cardiovascular;  Laterality: N/A;  . PORTACATH PLACEMENT N/A 02/23/2018   Procedure: INSERTION PORT-A-CATH;  Surgeon: Nestor Lewandowsky, MD;  Location: [facility];  Service: Thoracic;  Laterality: N/A;  . STRABISMUS SURGERY Left 1984  . TEE WITHOUT CARDIOVERSION N/A 09/08/2016   Procedure: TRANSESOPHAGEAL ECHOCARDIOGRAM (TEE);  Surgeon: [name], MD;  Location: [facility];  Service: Cardiovascular;  Laterality: N/A;  . VIDEO ASSISTED THORACOSCOPY (VATS) W/TALC PLEUADESIS Right 02/23/2018   Procedure: VIDEO ASSISTED THORACOSCOPY (VATS) W/TALC PLEUADESIS;  Surgeon: Nestor Lewandowsky, MD;  Location: [facility];  Service: Thoracic;  Laterality: Right;  . VITRECTOMY Right 2013     SOCIAL HISTORY:   Social History   Tobacco Use  . Smoking status: Former Smoker    Packs/day: 0.50    Years: 10.00    Pack years: 5.00    Last attempt to quit: 2011    Years since quitting: 9.1  . Smokeless tobacco: Former Systems developer    Types: Snuff  Substance Use Topics  . Alcohol use: Yes    Alcohol/week: 3.0 standard drinks    Types: 3 Cans of beer per week     FAMILY HISTORY:   Family History  Problem Relation Age of Onset  . Other Paternal Uncle        Abdominal tumor      DRUG ALLERGIES:  No Known Allergies  MEDICATIONS AT HOME:   Prior to Admission medications   Medication Sig  Start Date End Date Taking? Authorizing Provider  acetaminophen (TYLENOL) 325 MG tablet Take 2 tablets (650 mg total) by mouth every 6 (six) hours as needed for mild pain (or Fever >/= 101). 03/27/18   Debbe Odea, MD  B Complex-C (B-COMPLEX WITH VITAMIN C) tablet Take 1 tablet by mouth daily.    [provider]  dexamethasone (DECADRON) 4 MG tablet Take 2 pills in AM and PM x 3 days; start the day prior to chemo 11/01/18   Cammie Sickle, MD  dicyclomine (BENTYL) 10 MG capsule Take 1 capsule (10 mg total) by mouth 4 (four) times daily -  before meals and at bedtime. 05/25/18   Cammie Sickle, MD  dronabinol  (MARINOL) 5 MG capsule Take 1 capsule (5 mg total) by mouth 2 (two) times daily before a meal. 09/21/18   Verlon Au, NP  DULoxetine (CYMBALTA) 30 MG capsule Take 1 capsule (30 mg total) by mouth daily. 11/01/18   Borders, Kirt Boys, NP  furosemide (LASIX) 20 MG tablet Take 1 tablet (20 mg total) by mouth daily as needed for edema. 06/18/18 09/21/23  Leanor Kail, PA  HYDROmorphone (DILAUDID) 2 MG tablet Take 1-2 tablets (2-4 mg total) by mouth every 4 (four) hours as needed for moderate pain or severe pain. 11/22/18   Borders, Kirt Boys, NP  ibuprofen (ADVIL,MOTRIN) 200 MG tablet Take 800 mg by mouth daily as needed for mild pain.    [provider]  lidocaine-prilocaine (EMLA) cream Apply 1 application topically as needed. Apply generously over the Mediport 45 minutes prior to chemotherapy. 05/11/18   Cammie Sickle, MD  loratadine (CLARITIN) 10 MG tablet Take 10 mg by mouth daily as needed for allergies.     [provider]  LORazepam (ATIVAN) 0.5 MG tablet Take 1 tablet (0.5 mg total) by mouth every 8 (eight) hours as needed for anxiety. 10/26/18   Cammie Sickle, MD  metoprolol tartrate (LOPRESSOR) 25 MG tablet Take 1 tablet (25 mg total) by mouth 2 (two) times daily. 02/27/18   Dustin Flock, MD  morphine (MS CONTIN) 30 MG 12 hr tablet Take 1 tablet (30 mg total) by mouth every 12 (twelve) hours. 11/22/18   Borders, Kirt Boys, NP  Multiple Vitamin (MULTIVITAMIN WITH MINERALS) TABS tablet Take 1 tablet by mouth daily.    [provider]  rivaroxaban (XARELTO) 20 MG TABS tablet Take 1 tablet (20 mg total) by mouth daily with supper. 09/28/18   Cammie Sickle, MD  tadalafil (CIALIS) 5 MG tablet Take 5 mg by mouth daily as needed for erectile dysfunction.    [provider]  triamcinolone (NASACORT) 55 MCG/ACT AERO nasal inhaler Place 2 sprays into the nose daily as needed (congestion).     [provider]  Vitamin D, Ergocalciferol,  (DRISDOL) 50000 units CAPS capsule Take 1 capsule (50,000 Units total) by mouth every 7 (seven) days. Patient taking differently: Take 50,000 Units by mouth every 7 (seven) days. Mondays 05/05/16   Lyndal Pulley, DO  zolpidem (AMBIEN) 10 MG tablet Take 10 mg by mouth at bedtime as needed for sleep.  12/10/15   [provider]  prochlorperazine (COMPAZINE) 10 MG tablet Take 1 tablet (10 mg total) by mouth every 6 (six) hours as needed (Nausea or vomiting). 07/20/18 11/01/18  Cammie Sickle, MD    REVIEW OF SYSTEMS:  Review of Systems  Constitutional: Positive for fever and malaise/fatigue. Negative for chills and weight loss.  HENT: Negative  for ear pain, hearing loss and tinnitus.   Eyes: Negative for blurred vision, double vision, pain and redness.  Respiratory: Positive for cough, sputum production and shortness of breath. Negative for hemoptysis.   Cardiovascular: Negative for chest pain, palpitations, orthopnea and leg swelling.  Gastrointestinal: Negative for abdominal pain, constipation, diarrhea, nausea and vomiting.  Genitourinary: Negative for dysuria, frequency and hematuria.  Musculoskeletal: Negative for back pain, joint pain and neck pain.  Skin:       No acne, rash, or lesions  Neurological: Negative for dizziness, tremors, focal weakness and weakness.  Endo/Heme/Allergies: Negative for polydipsia. Does not bruise/bleed easily.  Psychiatric/Behavioral: Negative for depression. The patient is not nervous/anxious and does not have insomnia.      VITAL SIGNS:   Vitals:   11/30/2018 2059 12/13/2018 2130 12/22/2018 2145 11/26/2018 2200  BP:    126/77  Pulse:  (!) 123 (!) 115 (!) 111  Resp:  (!) 26 (!) 34 (!) 24  Temp:      TempSrc:      SpO2:  94% 91% 90%  Weight: 60.3 kg     Height: 5\' 6"  (1.676 m)      Wt Readings from Last 3 Encounters:  11/30/2018 60.3 kg  11/22/18 60.3 kg  11/01/18 62.1 kg    PHYSICAL EXAMINATION:  Physical Exam  Vitals  reviewed. Constitutional: He is oriented to person, place, and time. He appears well-developed and well-nourished. No distress.  HENT:  Head: Normocephalic and atraumatic.  Mouth/Throat: Oropharynx is clear and moist.  Eyes: Pupils are equal, round, and reactive to light. Conjunctivae and EOM are normal. No scleral icterus.  Neck: Normal range of motion. Neck supple. No JVD present. No thyromegaly present.  Cardiovascular: Regular rhythm and intact distal pulses. Exam reveals no gallop and no friction rub.  No murmur heard. Tachycardic  Respiratory: Effort normal. No respiratory distress. He has no wheezes. He has no rales.  Coarse breath sounds  GI: Soft. Bowel sounds are normal. He exhibits no distension. There is no abdominal tenderness.  Musculoskeletal: Normal range of motion.        General: No edema.     Comments: No arthritis, no gout  Lymphadenopathy:    He has no cervical adenopathy.  Neurological: He is alert and oriented to person, place, and time. No cranial nerve deficit.  No dysarthria, no aphasia  Skin: Skin is warm and dry. No rash noted. No erythema.  Psychiatric: He has a normal mood and affect. His behavior is normal. Judgment and thought content normal.    LABORATORY PANEL:   CBC Recent Labs  Lab 12/17/2018 2112  WBC 10.8*  HGB 10.7*  HCT 32.2*  PLT 118*   ------------------------------------------------------------------------------------------------------------------  Chemistries  Recent Labs  Lab 12/24/2018 2112  NA 130*  K 3.8  CL 94*  CO2 26  GLUCOSE 88  BUN 12  CREATININE 0.57*  CALCIUM 8.8*  AST 15  ALT 17  ALKPHOS 146*  BILITOT 0.9   ------------------------------------------------------------------------------------------------------------------  Cardiac Enzymes No results for input(s): TROPONINI in the last 168  hours. ------------------------------------------------------------------------------------------------------------------  RADIOLOGY:  Dg Chest Port 1 View  Result Date: 12/22/2018 CLINICAL DATA:  Fever and shortness of breath today. On chemotherapy for lung cancer. EXAM: PORTABLE CHEST 1 VIEW COMPARISON:  Chest radiograph October 15, 2018 FINDINGS: Worsening opacification RIGHT hemithorax with air bronchograms. Interstitial prominence LEFT lung increased from prior examination. No LEFT pleural effusion or focal consolidation. Cardiac silhouette is predominantly obscured. No pneumothorax. Single-lumen RIGHT chest Port-A-Cath  via RIGHT internal jugular venous approach with distal tip projecting distal superior vena cava. No pneumothorax. Old LEFT rib fracture. IMPRESSION: 1. Worsening opacity of RIGHT lung concerning disease progression though given increased interstitial prominence on the LEFT, atypical infection or pulmonary edema possible. Electronically Signed   By: Elon Alas M.D.   On: 12/15/2018 20:53    EKG:   Orders placed or performed during the hospital encounter of 12/22/2018  . ED EKG 12-Lead  . ED EKG 12-Lead    IMPRESSION AND PLAN:  Principal Problem:   Sepsis ([place]) -IV antibiotics initiated in the ED, source of infection is his pneumonia, lactic acid was initially elevated, IV fluids started and will trend lactic acid until within normal limits, blood pressure stable, cultures sent Active Problems:   Primary cancer of right lower lobe of lung [facility]) -oncology consult for any further recommendations as the patient is on chemotherapy   HCAP (healthcare-associated pneumonia) -IV antibiotics as above, PRN DuoNeb and antitussive, other supportive care as needed   Essential (primary) hypertension -continue home meds  Chart review performed and case discussed with ED provider. Labs, imaging and/or ECG reviewed by provider and discussed with patient/family. Management plans  discussed with the patient and/or family.  DVT PROPHYLAXIS: Systemic anticoagulation  GI PROPHYLAXIS:  None  ADMISSION STATUS: Inpatient     CODE STATUS: Partial, no CPR or defibrillation, patient has MOST form Code Status History    Date Active Date Inactive Code Status Order ID Comments User Context   03/21/2018 1643 03/27/2018 1654 Partial Code [id number]  Rush Farmer, MD Inpatient   03/21/2018 0249 03/21/2018 1643 Full Code [id number]  Rise Patience, MD Inpatient   02/21/2018 1849 02/27/2018 1653 Full Code [id number]  [name], MD ED   10/07/2016 1244 10/08/2016 1701 Full Code [id number]  [name], MD Inpatient   09/07/2016 0533 09/08/2016 1532 Full Code [id number]  Saundra Shelling, MD Inpatient    Questions for Most Recent Historical Code Status (Order [id number])    Question Answer Comment   In the event of cardiac or respiratory ARREST: Initiate Code Blue, Call Rapid Response Yes    In the event of cardiac or respiratory ARREST: Perform CPR No    In the event of cardiac or respiratory ARREST: Perform Intubation/Mechanical Ventilation Yes    In the event of cardiac or respiratory ARREST: Use NIPPV/BiPAp only if indicated Yes    In the event of cardiac or respiratory ARREST: Administer ACLS medications if indicated Yes    In the event of cardiac or respiratory ARREST: Perform Defibrillation or Cardioversion if indicated No    Comments No trach/peg.         Advance Directive Documentation     Most Recent Value  Type of Advance Directive  Living will, Healthcare Power of Attorney  Pre-existing out of facility DNR order (yellow form or pink MOST form)  -  "MOST" Form in Place?  -      TOTAL TIME TAKING CARE OF THIS PATIENT: 45 minutes.   Ethlyn Daniels 12/10/2018, 11:38 PM  [facility]  Office  [phone]  CC: Primary care physician; Dion Body, MD  Note:  This document was prepared using Dragon voice recognition software and may  include unintentional dictation errors.

## 2018-12-01 NOTE — ED Provider Notes (Signed)
[facility] Emergency Department Provider Note  ____________________________________________   I have reviewed the triage vital signs and the nursing notes.   HISTORY  Chief Complaint Code Sepsis; Shortness of Breath; and Fever   History limited by: Not Limited   HPI Ruben Eaton is a 48 y.o. male who presents to the emergency department today because of concerns for fever, shortness of breath and feeling unwell.  The patient states that he woke up  feeling this way.  He tried to see how he would respond throughout the day.  He does work third shift and states that he is generally tired.  The patient is undergoing chemotherapy for lung cancer.  Last treatment 2 weeks ago.  The patient has had some cough today.  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  He denies any rashes.  Denies any vomiting or diarrhea.   Per medical record review patient has a history of lung cancer on chemotherapy.  Past Medical History:  Diagnosis Date  . Anorexia   . 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 09/15/2016 in setting of PFO.  . CVA (cerebral vascular accident) ([place]) 09/07/2016  . ED (erectile dysfunction)   . Essential (primary) hypertension 02/01/2016  . History of stroke 10/08/2016  . Hx of insomnia   . Hypertension   . OSA on CPAP   . PFO (patent foramen ovale)    a. s/p closure 09/2016 by Dr. [name].  . Pleural effusion on right 02/21/2018  . Primary cancer of right lower lobe of lung ([place]) 02/25/2018   Chemo tx's and rad tx's  . PVC's (premature ventricular contractions) 10/08/2016  . 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 TIA (transient ischemic attack) 09/07/2016    Patient Active Problem List   Diagnosis Date Noted  . Hypertension   . 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 Non-small cell cancer of left lung ([place]) 03/22/2018  . Anasarca 03/21/2018  . Acute respiratory failure with hypoxia ([place]) 03/21/2018  . Pure  hypercholesterolemia 03/08/2018  . Goals of care, counseling/discussion 03/02/2018  . Anxiety 03/02/2018  . Primary cancer of right lower lobe of lung ([place]) 02/25/2018  . Pleural effusion 02/21/2018  . Vaccine counseling 09/01/2017  . OSA on CPAP 10/27/2016  . History of stroke 10/08/2016  . PVC's (premature ventricular contractions) 10/08/2016  . PFO (patent foramen ovale) 10/07/2016  . TIA (transient ischemic attack) 09/07/2016  . CVA (cerebral vascular accident) ([place]) 09/07/2016  . 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 Essential (primary) hypertension 02/01/2016  . H/O disease 02/01/2016  . Quadriceps tendinitis 02/01/2016    Past Surgical History:  Procedure Laterality Date  . APPENDECTOMY  1980  . EYE SURGERY    . PATENT FORAMEN OVALE CLOSURE  10/07/2016  . PATENT FORAMEN OVALE CLOSURE N/A 10/07/2016   Procedure: Patent [place](PFO) Closure;  Surgeon: [name], MD;  Location: [place] [facility];  Service: Cardiovascular;  Laterality: N/A;  . PORTACATH PLACEMENT N/A 02/23/2018   Procedure: INSERTION PORT-A-CATH;  Surgeon: Nestor Lewandowsky, MD;  Location: [facility];  Service: Thoracic;  Laterality: N/A;  . STRABISMUS SURGERY Left 1984  . TEE WITHOUT CARDIOVERSION N/A 09/08/2016   Procedure: TRANSESOPHAGEAL ECHOCARDIOGRAM (TEE);  Surgeon: [name], MD;  Location: [facility];  Service: Cardiovascular;  Laterality: N/A;  . VIDEO ASSISTED THORACOSCOPY (VATS) W/TALC PLEUADESIS Right 02/23/2018   Procedure: VIDEO ASSISTED THORACOSCOPY (VATS) W/TALC PLEUADESIS;  Surgeon: Genevive Bi,  Christia Reading, MD;  Location: [facility] OR[facility];  Service: Thoracic;  Laterality: Right;  . VITRECTOMY Right 2013    Prior to Admission medications   Medication Sig Start Date End Date Taking? Authorizing Provider  acetaminophen (TYLENOL) 325 MG tablet Take 2 tablets (650 mg total) by mouth every 6 (six) hours as needed for mild pain (or Fever >/= 101). 03/27/18   Debbe Odea, MD  B Complex-C  (B-COMPLEX WITH VITAMIN C) tablet Take 1 tablet by mouth daily.    [provider]  dexamethasone (DECADRON) 4 MG tablet Take 2 pills in AM and PM x 3 days; start the day prior to chemo 11/01/18   Cammie Sickle, MD  dicyclomine (BENTYL) 10 MG capsule Take 1 capsule (10 mg total) by mouth 4 (four) times daily -  before meals and at bedtime. 05/25/18   Cammie Sickle, MD  dronabinol (MARINOL) 5 MG capsule Take 1 capsule (5 mg total) by mouth 2 (two) times daily before a meal. 09/21/18   Verlon Au, NP  DULoxetine (CYMBALTA) 30 MG capsule Take 1 capsule (30 mg total) by mouth daily. 11/01/18   Borders, Kirt Boys, NP  furosemide (LASIX) 20 MG tablet Take 1 tablet (20 mg total) by mouth daily as needed for edema. 06/18/18 09/21/23  Leanor Kail, PA  HYDROmorphone (DILAUDID) 2 MG tablet Take 1-2 tablets (2-4 mg total) by mouth every 4 (four) hours as needed for moderate pain or severe pain. 11/22/18   Borders, Kirt Boys, NP  ibuprofen (ADVIL,MOTRIN) 200 MG tablet Take 800 mg by mouth daily as needed for mild pain.    [provider]  lidocaine-prilocaine (EMLA) cream Apply 1 application topically as needed. Apply generously over the Mediport 45 minutes prior to chemotherapy. 05/11/18   Cammie Sickle, MD  loratadine (CLARITIN) 10 MG tablet Take 10 mg by mouth daily as needed for allergies.     [provider]  LORazepam (ATIVAN) 0.5 MG tablet Take 1 tablet (0.5 mg total) by mouth every 8 (eight) hours as needed for anxiety. 10/26/18   Cammie Sickle, MD  metoprolol tartrate (LOPRESSOR) 25 MG tablet Take 1 tablet (25 mg total) by mouth 2 (two) times daily. 02/27/18   Dustin Flock, MD  morphine (MS CONTIN) 30 MG 12 hr tablet Take 1 tablet (30 mg total) by mouth every 12 (twelve) hours. 11/22/18   Borders, Kirt Boys, NP  Multiple Vitamin (MULTIVITAMIN WITH MINERALS) TABS tablet Take 1 tablet by mouth daily.    [provider]  rivaroxaban  (XARELTO) 20 MG TABS tablet Take 1 tablet (20 mg total) by mouth daily with supper. 09/28/18   Cammie Sickle, MD  tadalafil (CIALIS) 5 MG tablet Take 5 mg by mouth daily as needed for erectile dysfunction.    [provider]  triamcinolone (NASACORT) 55 MCG/ACT AERO nasal inhaler Place 2 sprays into the nose daily as needed (congestion).     [provider]  Vitamin D, Ergocalciferol, (DRISDOL) 50000 units CAPS capsule Take 1 capsule (50,000 Units total) by mouth every 7 (seven) days. Patient taking differently: Take 50,000 Units by mouth every 7 (seven) days. Mondays 05/05/16   Lyndal Pulley, DO  zolpidem (AMBIEN) 10 MG tablet Take 10 mg by mouth at bedtime as needed for sleep.  12/10/15   [provider]  prochlorperazine (COMPAZINE) 10 MG tablet Take 1 tablet (10 mg total) by mouth every 6 (six) hours as needed (Nausea or vomiting). 07/20/18 11/01/18  Charlaine Dalton  R, MD    Allergies Patient has no known allergies.  Family History  Problem Relation Age of Onset  . Other Paternal Uncle        Abdominal tumor     Social History Social History   Tobacco Use  . Smoking status: Former Smoker    Packs/day: 0.50    Years: 10.00    Pack years: 5.00    Last attempt to quit: 2011    Years since quitting: 9.1  . Smokeless tobacco: Former Systems developer    Types: Snuff  Substance Use Topics  . Alcohol use: Yes    Alcohol/week: 3.0 standard drinks    Types: 3 Cans of beer per week  . Drug use: No    Review of Systems Constitutional: Positive for fever Eyes: No visual changes. ENT: No sore throat. Cardiovascular: Denies chest pain. Respiratory: Positive for cough and shortness of breath. Gastrointestinal: No abdominal pain.  No nausea, no vomiting.  No diarrhea.   Genitourinary: Negative for dysuria. Musculoskeletal: Negative for back pain. Skin: Negative for rash. Neurological: Negative for headaches, focal weakness or  numbness.  ____________________________________________   PHYSICAL EXAM:  VITAL SIGNS: ED Triage Vitals  Enc Vitals Group     BP 12/03/2018 2056 117/67     Pulse Rate 12/03/2018 2056 (!) 128     Resp 12/18/2018 2056 19     Temp 11/28/2018 2056 (!) 102.4 F (39.1 C)     Temp Source 12/24/2018 2056 Oral     SpO2 12/16/2018 2056 93 %     Weight 12/20/2018 2059 133 lb (60.3 kg)     Height 11/30/2018 2059 5\' 6"  (1.676 m)     Head Circumference --      Peak Flow --      Pain Score 12/19/2018 2058 4   Constitutional: Alert and oriented.  Eyes: Conjunctivae are normal.  ENT      Head: Normocephalic and atraumatic.      Nose: No congestion/rhinnorhea.      Mouth/Throat: Mucous membranes are dry      Neck: No stridor. Hematological/Lymphatic/Immunilogical: No cervical lymphadenopathy. Cardiovascular: Tachycardic, regular rhythm.  No murmurs, rubs, or gallops.  Respiratory: Increased respiratory effort. Coarse breath sounds right lung.  Gastrointestinal: Soft and non tender. No rebound. No guarding.  Genitourinary: Deferred Musculoskeletal: Normal range of motion in all extremities. No lower extremity edema. Neurologic:  Normal speech and language. No gross focal neurologic deficits are appreciated.  Skin:  Skin is warm, dry and intact. No rash noted. Psychiatric: Mood and affect are normal. Speech and behavior are normal. Patient exhibits appropriate insight and judgment.  ____________________________________________    LABS (pertinent positives/negatives)  CMP na 130, glu 88, cr 0.57 Lactic 2.7 CBC wbc 10.8, hgb 10.7, plt 118   ____________________________________________   EKG  I, Nance Pear, attending physician, personally viewed and interpreted this EKG  EKG Time: 2112 Rate: 118 Rhythm: sinus tachycardia Axis: right axis deviation Intervals: qtc 482 QRS: narrow, q waves v1, v2 ST changes: no st elevation Impression: abnormal  ekg    ____________________________________________    RADIOLOGY  CXR Worsening opacity of right lung concerning for worsening disease or possible infiltrate  CXR Slight worsening of right base opacity  ____________________________________________   PROCEDURES  Procedures  CRITICAL CARE Performed by: Nance Pear   Total critical care time: 40 minutes  Critical care time was exclusive of separately billable procedures and treating other patients.  Critical care was necessary to treat or prevent imminent or  life-threatening deterioration.  Critical care was time spent personally by me on the following activities: development of treatment plan with patient and/or surrogate as well as nursing, discussions with consultants, evaluation of patient's response to treatment, examination of patient, obtaining history from patient or surrogate, ordering and performing treatments and interventions, ordering and review of laboratory studies, ordering and review of radiographic studies, pulse oximetry and re-evaluation of patient's condition.  ____________________________________________   INITIAL IMPRESSION / ASSESSMENT AND PLAN / ED COURSE  Pertinent labs & imaging results that were available during my care of the patient were reviewed by me and considered in my medical decision making (see chart for details).   Patient presented to the emergency department today because of concerns for fever, shortness of breath and feeling unwell.  Symptoms started today.  Patient does have a history of lung cancer and is currently undergoing chemotherapy.  Initial vital signs were concerning for fever, tachycardia.  The setting of chemotherapy do have concerns initially for sepsis and possibly sepsis and neutropenic state.  Because of this patient was quickly ordered for broad-spectrum antibiotics and IV fluids. CXR was concerning for possible infiltrate. Blood work not consistent with neutropenia.  Lactic acid level was elevated.  At this point felt likely sepsis from pneumonia.  However shortly after starting fluids patient became acutely worse with his breathing. Repeat x-ray was obtained to evaluate for possible pulmonary edema. Left lung appears unchanged and patient did start improving without any specific intervention for possible fluid overload. Will plan on admission to the hospitalist service. Discussed plan with patient.     ____________________________________________   FINAL CLINICAL IMPRESSION(S) / ED DIAGNOSES  Final diagnoses:  Sepsis, due to unspecified organism, unspecified whether acute organ dysfunction present [facility])  Shortness of breath     Note: This dictation was prepared with Dragon dictation. Any transcriptional errors that result from this process are unintentional     Nance Pear, MD 12/02/18 [phone]

## 2018-12-01 NOTE — ED Notes (Signed)
Dr. Archie Balboa aware of lactic of 2.7.

## 2018-12-01 NOTE — ED Notes (Addendum)
Patient c/o hemoptysis, increased shortness of breath. Patient is sitting in a tripod position on the side of bed.Pulse ox 84% on room air. Patient placed on 2L o2 via [place], pulse ox increased to 89%. Ox increased to 3L and patient is at 91%. Patient had increased work of breathing.  Dr. Archie Balboa aware.

## 2018-12-02 ENCOUNTER — Other Ambulatory Visit: Payer: Self-pay

## 2018-12-02 ENCOUNTER — Other Ambulatory Visit: Payer: Self-pay | Admitting: Oncology

## 2018-12-02 DIAGNOSIS — Z87891 Personal history of nicotine dependence: Secondary | ICD-10-CM

## 2018-12-02 DIAGNOSIS — R5081 Fever presenting with conditions classified elsewhere: Secondary | ICD-10-CM

## 2018-12-02 DIAGNOSIS — D696 Thrombocytopenia, unspecified: Secondary | ICD-10-CM

## 2018-12-02 DIAGNOSIS — D649 Anemia, unspecified: Secondary | ICD-10-CM

## 2018-12-02 DIAGNOSIS — D72829 Elevated white blood cell count, unspecified: Secondary | ICD-10-CM

## 2018-12-02 DIAGNOSIS — C3431 Malignant neoplasm of lower lobe, right bronchus or lung: Secondary | ICD-10-CM

## 2018-12-02 DIAGNOSIS — R0602 Shortness of breath: Secondary | ICD-10-CM

## 2018-12-02 LAB — RESPIRATORY PANEL BY PCR
Adenovirus: NOT DETECTED
Bordetella pertussis: NOT DETECTED
CORONAVIRUS OC43-RVPPCR: NOT DETECTED
Chlamydophila pneumoniae: NOT DETECTED
Coronavirus 229E: NOT DETECTED
Coronavirus HKU1: NOT DETECTED
Coronavirus NL63: NOT DETECTED
INFLUENZA B-RVPPCR: NOT DETECTED
Influenza A: NOT DETECTED
MYCOPLASMA PNEUMONIAE-RVPPCR: NOT DETECTED
Metapneumovirus: NOT DETECTED
Parainfluenza Virus 1: NOT DETECTED
Parainfluenza Virus 2: NOT DETECTED
Parainfluenza Virus 3: NOT DETECTED
Parainfluenza Virus 4: NOT DETECTED
RESPIRATORY SYNCYTIAL VIRUS-RVPPCR: NOT DETECTED
Rhinovirus / Enterovirus: NOT DETECTED

## 2018-12-02 LAB — LACTIC ACID, PLASMA
LACTIC ACID, VENOUS: 2.2 mmol/L — AB (ref 0.5–1.9)
LACTIC ACID, VENOUS: 2 mmol/L — AB (ref 0.5–1.9)

## 2018-12-02 LAB — CBC
HCT: 29.2 % — ABNORMAL LOW (ref 39.0–52.0)
Hemoglobin: 9.4 g/dL — ABNORMAL LOW (ref 13.0–17.0)
MCH: 31.4 pg (ref 26.0–34.0)
MCHC: 32.2 g/dL (ref 30.0–36.0)
MCV: 97.7 fL (ref 80.0–100.0)
Platelets: 106 10*3/uL — ABNORMAL LOW (ref 150–400)
RBC: 2.99 MIL/uL — ABNORMAL LOW (ref 4.22–5.81)
RDW: 14.1 % (ref 11.5–15.5)
WBC: 13 10*3/uL — ABNORMAL HIGH (ref 4.0–10.5)
nRBC: 0 % (ref 0.0–0.2)

## 2018-12-02 LAB — BASIC METABOLIC PANEL
ANION GAP: 8 (ref 5–15)
BUN: 12 mg/dL (ref 6–20)
CO2: 25 mmol/L (ref 22–32)
Calcium: 8 mg/dL — ABNORMAL LOW (ref 8.9–10.3)
Chloride: 98 mmol/L (ref 98–111)
Creatinine, Ser: 0.53 mg/dL — ABNORMAL LOW (ref 0.61–1.24)
GFR calc Af Amer: >60 mL/min (ref >60)
GFR calc non Af Amer: >60 mL/min (ref >60)
Glucose, Bld: 81 mg/dL (ref 70–99)
Potassium: 3.7 mmol/L (ref 3.5–5.1)
Sodium: 131 mmol/L — ABNORMAL LOW (ref 135–145)

## 2018-12-02 LAB — MRSA PCR SCREENING: MRSA by PCR: NEGATIVE

## 2018-12-02 MED ORDER — ZOLPIDEM TARTRATE 5 MG PO TABS: 10.0000 mg | ORAL_TABLET | Freq: Every evening | ORAL | Status: DC | PRN

## 2018-12-02 MED ORDER — MORPHINE SULFATE ER 15 MG PO TBCR
30.0000 mg | EXTENDED_RELEASE_TABLET | Freq: Two times a day (BID) | ORAL | Status: DC
  Administered 2018-12-02 – 2018-12-07 (×11): 30 mg via ORAL
  Filled 2018-12-02 (×13): qty 2

## 2018-12-02 MED ORDER — RIVAROXABAN 20 MG PO TABS
20.0000 mg | ORAL_TABLET | Freq: Every day | ORAL | Status: DC
  Administered 2018-12-02 – 2018-12-07 (×7): 20 mg via ORAL
  Filled 2018-12-02 (×8): qty 1

## 2018-12-02 MED ORDER — FUROSEMIDE 20 MG PO TABS
20.0000 mg | ORAL_TABLET | Freq: Every day | ORAL | Status: DC | PRN
  Administered 2018-12-02 (×2): 20 mg via ORAL
  Filled 2018-12-02 (×2): qty 1

## 2018-12-02 MED ORDER — ACETAMINOPHEN 650 MG RE SUPP: 650.0000 mg | Freq: Four times a day (QID) | RECTAL | Status: DC | PRN

## 2018-12-02 MED ORDER — SODIUM CHLORIDE 0.9 % IV SOLN
INTRAVENOUS | Status: DC | PRN
  Administered 2018-12-02 – 2018-12-03 (×3): 500 mL via INTRAVENOUS

## 2018-12-02 MED ORDER — GUAIFENESIN-DM 100-10 MG/5ML PO SYRP
5.0000 mL | ORAL_SOLUTION | ORAL | Status: DC | PRN
  Filled 2018-12-02: qty 5

## 2018-12-02 MED ORDER — HYDROMORPHONE HCL 2 MG PO TABS
2.0000 mg | ORAL_TABLET | ORAL | Status: DC | PRN
  Administered 2018-12-02 (×3): 2 mg via ORAL
  Administered 2018-12-03: 4 mg via ORAL
  Administered 2018-12-03 (×2): 2 mg via ORAL
  Administered 2018-12-03: 4 mg via ORAL
  Administered 2018-12-04 (×3): 2 mg via ORAL
  Filled 2018-12-02 (×3): qty 1
  Filled 2018-12-02 (×2): qty 2
  Filled 2018-12-02: qty 1
  Filled 2018-12-02: qty 2
  Filled 2018-12-02 (×3): qty 1

## 2018-12-02 MED ORDER — PHENOL 1.4 % MT LIQD
1.0000 | OROMUCOSAL | Status: DC | PRN
  Administered 2018-12-02 (×2): 1 via OROMUCOSAL
  Filled 2018-12-02: qty 177

## 2018-12-02 MED ORDER — SODIUM CHLORIDE 0.9 % IV SOLN
2.0000 g | Freq: Three times a day (TID) | INTRAVENOUS | Status: AC
  Administered 2018-12-02 – 2018-12-05 (×12): 2 g via INTRAVENOUS
  Filled 2018-12-02 (×12): qty 2

## 2018-12-02 MED ORDER — DULOXETINE HCL 30 MG PO CPEP
30.0000 mg | ORAL_CAPSULE | Freq: Every day | ORAL | Status: DC
  Administered 2018-12-02 – 2018-12-06 (×5): 30 mg via ORAL
  Filled 2018-12-02 (×5): qty 1

## 2018-12-02 MED ORDER — ONDANSETRON HCL 4 MG PO TABS: 4.0000 mg | ORAL_TABLET | Freq: Four times a day (QID) | ORAL | Status: DC | PRN

## 2018-12-02 MED ORDER — IPRATROPIUM-ALBUTEROL 0.5-2.5 (3) MG/3ML IN SOLN: 3.0000 mL | RESPIRATORY_TRACT | Status: DC | PRN

## 2018-12-02 MED ORDER — IBUPROFEN 400 MG PO TABS
800.0000 mg | ORAL_TABLET | Freq: Every day | ORAL | Status: DC | PRN
  Filled 2018-12-02: qty 1

## 2018-12-02 MED ORDER — LORAZEPAM 0.5 MG PO TABS
0.5000 mg | ORAL_TABLET | Freq: Three times a day (TID) | ORAL | Status: DC | PRN
  Administered 2018-12-02 – 2018-12-07 (×4): 0.5 mg via ORAL
  Filled 2018-12-02 (×5): qty 1

## 2018-12-02 MED ORDER — BENZONATATE 100 MG PO CAPS: 200.0000 mg | ORAL_CAPSULE | Freq: Three times a day (TID) | ORAL | Status: DC | PRN

## 2018-12-02 MED ORDER — VANCOMYCIN HCL 10 G IV SOLR
1250.0000 mg | Freq: Two times a day (BID) | INTRAVENOUS | Status: DC
  Administered 2018-12-02: 1250 mg via INTRAVENOUS
  Filled 2018-12-02 (×2): qty 1250

## 2018-12-02 MED ORDER — ACETAMINOPHEN 325 MG PO TABS: 650.0000 mg | ORAL_TABLET | Freq: Four times a day (QID) | ORAL | Status: DC | PRN

## 2018-12-02 MED ORDER — ONDANSETRON HCL 4 MG/2ML IJ SOLN: 4.0000 mg | Freq: Four times a day (QID) | INTRAMUSCULAR | Status: DC | PRN

## 2018-12-02 MED ORDER — METOPROLOL TARTRATE 25 MG PO TABS
12.5000 mg | ORAL_TABLET | Freq: Two times a day (BID) | ORAL | Status: DC
  Administered 2018-12-02 – 2018-12-05 (×5): 12.5 mg via ORAL
  Filled 2018-12-02 (×6): qty 1

## 2018-12-02 NOTE — Progress Notes (Signed)
[place] at [place] NAME: Ruben Eaton    MR#:  [id number]  DATE OF BIRTH:  12/02/1970  SUBJECTIVE:   Patient presented to the hospital secondary to fever, shortness of breath and suspected to have pneumonia.  Patient currently ongoing treatment for stage IV lung cancer.  Fever has resolved now, patient denies any other complaints presently.  Wife is at bedside.  REVIEW OF SYSTEMS:    Review of Systems  Constitutional: Negative for chills and fever.  HENT: Negative for congestion and tinnitus.   Eyes: Negative for blurred vision and double vision.  Respiratory: Negative for cough, shortness of breath and wheezing.   Cardiovascular: Negative for chest pain, orthopnea and PND.  Gastrointestinal: Negative for abdominal pain, diarrhea, nausea and vomiting.  Genitourinary: Negative for dysuria and hematuria.  Neurological: Negative for dizziness, sensory change and focal weakness.  All other systems reviewed and are negative.   Nutrition: Heart Healthy Tolerating Diet: Yes Tolerating PT: await Eval.    DRUG ALLERGIES:  No Known Allergies  VITALS:  Blood pressure 118/84, pulse (!) 108, temperature (!) 97.3 F (36.3 C), temperature source Oral, resp. rate 20, height _0  (1.676 m), weight 62.1 kg, SpO2 100 %.  PHYSICAL EXAMINATION:   Physical Exam  GENERAL:  48 y.o.-year-old patient lying in bed in no acute distress.  EYES: Pupils equal, round, reactive to light and accommodation. No scleral icterus. Extraocular muscles intact.  HEENT: Head atraumatic, normocephalic. Oropharynx and nasopharynx clear.  NECK:  Supple, no jugular venous distention. No thyroid enlargement, no tenderness.  LUNGS: Normal breath sounds bilaterally, no wheezing, rales, rhonchi. No use of accessory muscles of respiration.  CARDIOVASCULAR: S1, S2 normal. No murmurs, rubs, or gallops. Right chest wall port-a-cath in place.   ABDOMEN: Soft, nontender,  nondistended. Bowel sounds present. No organomegaly or mass.  EXTREMITIES: No cyanosis, clubbing or edema b/l.    NEUROLOGIC: Cranial nerves II through XII are intact. No focal Motor or sensory deficits b/l.   PSYCHIATRIC: The patient is alert and oriented x 3.  SKIN: No obvious rash, lesion, or ulcer.    LABORATORY PANEL:   CBC Recent Labs  Lab 12/02/18 0110  WBC 13.0*  HGB 9.4*  HCT 29.2*  PLT 106*   ------------------------------------------------------------------------------------------------------------------  Chemistries  Recent Labs  Lab 12/13/2018 2112 12/02/18 0110  NA 130* 131*  K 3.8 3.7  CL 94* 98  CO2 26 25  GLUCOSE 88 81  BUN 12 12  CREATININE 0.57* 0.53*  CALCIUM 8.8* 8.0*  AST 15  --   ALT 17  --   ALKPHOS 146*  --   BILITOT 0.9  --    ------------------------------------------------------------------------------------------------------------------  Cardiac Enzymes No results for input(s): TROPONINI in the last 168 hours. ------------------------------------------------------------------------------------------------------------------  RADIOLOGY:  Dg Chest Portable 1 View  Result Date: 12/05/2018 CLINICAL DATA:  Severe dyspnea.  History of cancer. EXAM: PORTABLE CHEST 1 VIEW COMPARISON:  12/02/2018 at 1721 hours FINDINGS: Near complete opacification of the right hemithorax sparing the right hilar region. Slight interval worsening of pulmonary consolidation noted at the right base since prior. Mild interstitial edema the left lung persists. Obscuration of the right heart border is noted. Port catheter tip is unchanged projecting to the cavoatrial level. No aggressive osseous abnormality. IMPRESSION: Interval slight worsening of right base pulmonary opacity since most recent exam. Electronically Signed   By: Ashley Royalty M.D.   On: 12/06/2018 23:37   Dg Chest [facility] 1 [address]  Result Date: 12/17/2018 CLINICAL DATA:  Fever and shortness of breath today. On  chemotherapy for lung cancer. EXAM: PORTABLE CHEST 1 VIEW COMPARISON:  Chest radiograph October 15, 2018 FINDINGS: Worsening opacification RIGHT hemithorax with air bronchograms. Interstitial prominence LEFT lung increased from prior examination. No LEFT pleural effusion or focal consolidation. Cardiac silhouette is predominantly obscured. No pneumothorax. Single-lumen RIGHT chest Port-A-Cath via RIGHT internal jugular venous approach with distal tip projecting distal superior vena cava. No pneumothorax. Old LEFT rib fracture. IMPRESSION: 1. Worsening opacity of RIGHT lung concerning disease progression though given increased interstitial prominence on the LEFT, atypical infection or pulmonary edema possible. Electronically Signed   By: Elon Alas M.D.   On: 12/25/2018 20:53     ASSESSMENT AND PLAN:   48 year old male with past medical history of stage IV lung cancer, previous TIA, hypertension, obstructive sleep apnea, history of previous CVA who presented to the hospital due to fever, shortness of breath.  1.  Sepsis-patient met criteria on admission given his fever, tachycardia, leukocytosis and chest x-ray findings suggestive of pneumonia. -Continue IV cefepime, will DC vancomycin as MRSA PCR is negative.  Follow cultures.  2.  Pneumonia-source of patient's shortness of breath sepsis. - We will treat the patient with IV cefepime, DC vancomycin as patient's MRSA PCR is negative. -Await culture results.  Continue supportive care for now.  3.  Lung cancer-patient has stage IV lung cancer currently ongoing treatment with Dr. Rogue Bussing. -Appreciate oncology consult and continue further care as per them.  4.  Anxiety/depression-continue Ativan, Cymbalta.  5. Essential HTN - cont. Metoprolol  6. Hx of previous PE - cont. Xarelto.    All the records are reviewed and case discussed with Care Management/Social Worker. Management plans discussed with the patient, family and they are in  agreement.  CODE STATUS: Partial Code  DVT Prophylaxis: Xarelto  TOTAL TIME TAKING CARE OF THIS PATIENT: 30 minutes.   POSSIBLE D/C IN 1-2 DAYS, DEPENDING ON CLINICAL CONDITION.   Henreitta Leber M.D on 12/02/2018 at 2:08 PM  Between 7am to 6pm - Pager - [phone]  After 6pm go to [email or web address] - Proofreader  [facility] [place] [facility]  Office  [phone]  CC: Primary care physician; Dion Body, MD

## 2018-12-02 NOTE — Plan of Care (Signed)
  Problem: Clinical Measurements: Goal: Respiratory complications will improve Outcome: Progressing   Problem: Fluid Volume: Goal: Hemodynamic stability will improve Outcome: Progressing   Problem: Education: Goal: Knowledge of General Education information will improve Description Including pain rating scale, medication(s)/side effects and non-pharmacologic comfort measures Outcome: Progressing

## 2018-12-02 NOTE — Progress Notes (Signed)
Pharmacy Antibiotic Note  Ruben Eaton is a 48 y.o. male admitted on 11/30/2018 with sepsis.  Pharmacy has been consulted for Vancomycin and Cefepime dosing.  Plan: Vancomycin 1500 mg IV bolus given in the ED Vancomycin 1250 mg IV Q 12 hrs. Goal AUC 400-550. Expected AUC: 493.9 SCr used: 0.57  Cefepime 2g IV q8h   Height: 5\' 6"  (167.6 cm) Weight: 133 lb (60.3 kg) IBW/kg (Calculated) : 63.8  Temp (24hrs), Avg:100.4 F (38 C), Min:98.4 F (36.9 C), Max:102.4 F (39.1 C)  Recent Labs  Lab 11/27/2018 2112  WBC 10.8*  CREATININE 0.57*  LATICACIDVEN 2.7*    Estimated Creatinine Clearance: 96.3 mL/min (A) (by C-G formula based on SCr of 0.57 mg/dL (L)).    No Known Allergies  Antimicrobials this admission: Vancomycin 3/7 >>   Cefepime 3/7 >>  Metronidazole 3/7 >>   Microbiology results: 3/7 BCx: pending  Thank you for allowing pharmacy to be a part of this patient's care.  Paulina Fusi, PharmD, BCPS 12/02/2018 12:31 AM

## 2018-12-02 NOTE — ED Notes (Signed)
ED TO INPATIENT HANDOFF REPORT  ED Nurse Name and Phone #: Ena Dawley [phone]  S Name/Age/Gender Ruben Eaton 48 y.o. male Room/Bed: ED13A/ED13A  Code Status   Code Status: Prior  Home/SNF/Other Home Patient oriented to: self, place, time and situation Is this baseline? Yes   Triage Complete: Triage complete  Chief Complaint chemo pt, fever 101  Triage Note FIRST NURSE NOTE-pt is chemo pt with fever. Skin feels hot.  Pulled for triage. Mask on.  Pt currently receiving chemo treatment for lung CA-last treatment was 2 weeks ago; pt reports fever since he woke this afternoon as well as worsening shortness of breath; temp at home 101.4-no meds given; -pt talking in complete coherent sentences;    Allergies No Known Allergies  Level of Care/Admitting Diagnosis ED Disposition    ED Disposition Condition [facility] Area: [place] [100120]  Level of Care: Med-Surg [16]  Diagnosis: Sepsis [facility]) [[id number]]  Admitting Physician: [name] [[id number]]  Attending Physician: [name] [phone]  Estimated length of stay: past midnight tomorrow  Certification:: I certify this patient will need inpatient services for at least 2 midnights  PT Class (Do Not Modify): Inpatient [101]  PT Acc Code (Do Not Modify): Private [1]       B Medical/Surgery History Past Medical History:  Diagnosis Date  . Anorexia   . 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 09/15/2016 in setting of PFO.  . CVA (cerebral vascular accident) ([place]) 09/07/2016  . ED (erectile dysfunction)   . Essential (primary) hypertension 02/01/2016  . History of stroke 10/08/2016  . Hx of insomnia   . Hypertension   . OSA on CPAP   . PFO (patent foramen ovale)    a. s/p closure 09/2016 by Dr. [name].  . Pleural effusion on right 02/21/2018  . Primary cancer of right lower lobe of lung ([place]) 02/25/2018   Chemo tx's and rad tx's  . PVC's (premature ventricular contractions) 10/08/2016   . 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 TIA (transient ischemic attack) 09/07/2016   Past Surgical History:  Procedure Laterality Date  . APPENDECTOMY  1980  . EYE SURGERY    . PATENT FORAMEN OVALE CLOSURE  10/07/2016  . PATENT FORAMEN OVALE CLOSURE N/A 10/07/2016   Procedure: Patent Forament Ovale(PFO) Closure;  Surgeon: [name], MD;  Location: [place] CV LAB;  Service: Cardiovascular;  Laterality: N/A;  . PORTACATH PLACEMENT N/A 02/23/2018   Procedure: INSERTION PORT-A-CATH;  Surgeon: Nestor Lewandowsky, MD;  Location: [facility] ORS;  Service: Thoracic;  Laterality: N/A;  . STRABISMUS SURGERY Left 1984  . TEE WITHOUT CARDIOVERSION N/A 09/08/2016   Procedure: TRANSESOPHAGEAL ECHOCARDIOGRAM (TEE);  Surgeon: [name], MD;  Location: [facility] ORS;  Service: Cardiovascular;  Laterality: N/A;  . VIDEO ASSISTED THORACOSCOPY (VATS) W/TALC PLEUADESIS Right 02/23/2018   Procedure: VIDEO ASSISTED THORACOSCOPY (VATS) W/TALC PLEUADESIS;  Surgeon: Nestor Lewandowsky, MD;  Location: [facility] ORS;  Service: Thoracic;  Laterality: Right;  . VITRECTOMY Right 2013     A IV Location/Drains/Wounds Patient Lines/Drains/Airways Status   Active Line/Drains/Airways    Name:   Placement date:   Placement time:   Site:   Days:   Implanted Port 03/20/18 Right Chest   03/20/18    2345    Chest   257   Implanted Port 10/12/18 Right Chest   10/12/18    1055    Chest   51   Implanted Port 11/29/2018  11/26/2018    2127    -   1   Peripheral IV 11/29/2018 Left Antecubital   12/21/2018    2110    Antecubital   1   Chest Tube 1 Right Pleural   -    -    Pleural      Incision (Closed) 02/23/18 Chest Right   02/23/18    1356     282          Intake/Output Last 24 hours  Intake/Output Summary (Last 24 hours) at 12/02/2018 0004 Last data filed at 11/25/2018 2359 Gross per 24 hour  Intake 1650.23 ml  Output -  Net 1650.23 ml    Labs/Imaging Results for orders placed or performed during the hospital  encounter of 12/14/2018 (from the past 48 hour(s))  Lactic acid, plasma     Status: Abnormal   Collection Time: 12/22/2018  9:12 PM  Result Value Ref Range   Lactic Acid, Venous 2.7 (HH) 0.5 - 1.9 mmol/L    Comment: CRITICAL RESULT CALLED TO, READ BACK BY AND VERIFIED WITH SONJIA WEAVER 12/21/2018 @ 2142  [place] Performed at [facility], [address] [place], [place] [address]   Comprehensive metabolic panel     Status: Abnormal   Collection Time: 12/22/2018  9:12 PM  Result Value Ref Range   Sodium 130 (L) 135 - 145 mmol/L   Potassium 3.8 3.5 - 5.1 mmol/L   Chloride 94 (L) 98 - 111 mmol/L   CO2 26 22 - 32 mmol/L   Glucose, Bld 88 70 - 99 mg/dL   BUN 12 6 - 20 mg/dL   Creatinine, Ser 0.57 (L) 0.61 - 1.24 mg/dL   Calcium 8.8 (L) 8.9 - 10.3 mg/dL   Total Protein 6.2 (L) 6.5 - 8.1 g/dL   Albumin 2.6 (L) 3.5 - 5.0 g/dL   AST 15 15 - 41 U/L   ALT 17 0 - 44 U/L   Alkaline Phosphatase 146 (H) 38 - 126 U/L   Total Bilirubin 0.9 0.3 - 1.2 mg/dL   GFR calc non Af Amer >60 >60 mL/min   GFR calc Af Amer >60 >60 mL/min   Anion gap 10 5 - 15    Comment: Performed at [facility], [address] [place],  [address]  CBC WITH DIFFERENTIAL     Status: Abnormal   Collection Time: 12/02/2018  9:12 PM  Result Value Ref Range   WBC 10.8 (H) 4.0 - 10.5 K/uL   RBC 3.36 (L) 4.22 - 5.81 MIL/uL   Hemoglobin 10.7 (L) 13.0 - 17.0 g/dL   HCT 32.2 (L) 39.0 - 52.0 %   MCV 95.8 80.0 - 100.0 fL   MCH 31.8 26.0 - 34.0 pg   MCHC 33.2 30.0 - 36.0 g/dL   RDW 14.0 11.5 - 15.5 %   Platelets 118 (L) 150 - 400 K/uL    Comment: REPEATED TO VERIFY SPECIMEN CHECKED FOR CLOTS Immature Platelet Fraction may be clinically indicated, consider ordering this additional test [id number]    nRBC 0.0 0.0 - 0.2 %   Neutrophils Relative % 88 %   Neutro Abs 9.6 (H) 1.7 - 7.7 K/uL   Lymphocytes Relative 4 %   Lymphs Abs 0.5 (L) 0.7 - 4.0 K/uL   Monocytes Relative 5 %   Monocytes Absolute 0.5 0.1 - 1.0  K/uL   Eosinophils Relative 0 %   Eosinophils Absolute 0.0 0.0 - 0.5 K/uL   Basophils Relative 1 %  Basophils Absolute 0.1 0.0 - 0.1 K/uL   Immature Granulocytes 2 %   Abs Immature Granulocytes 0.17 (H) 0.00 - 0.07 K/uL    Comment: Performed at [facility] [address] [place], [place] [address]  Lipase, blood     Status: None   Collection Time: 11/29/2018  9:12 PM  Result Value Ref Range   Lipase 15 11 - 51 U/L    Comment: Performed at [facility], [address] [place], [place] [address]  Influenza panel by PCR (type A & B)     Status: None   Collection Time: 12/24/2018 10:38 PM  Result Value Ref Range   Influenza A By PCR NEGATIVE NEGATIVE   Influenza B By PCR NEGATIVE NEGATIVE    Comment: (NOTE) The Xpert Xpress Flu assay is intended as an aid in the diagnosis of  influenza and should not be used as a sole basis for treatment.  This  assay is FDA approved for nasopharyngeal swab specimens only. Nasal  washings and aspirates are unacceptable for Xpert Xpress Flu testing. Performed at [facility], [address], [place], [place] [address]    Dg Chest Portable 1 View  Result Date: 11/26/2018 CLINICAL DATA:  Severe dyspnea.  History of cancer. EXAM: PORTABLE CHEST 1 VIEW COMPARISON:  12/12/2018 at 1721 hours FINDINGS: Near complete opacification of the right hemithorax sparing the right hilar region. Slight interval worsening of pulmonary consolidation noted at the right base since prior. Mild interstitial edema the left lung persists. Obscuration of the right heart border is noted. Port catheter tip is unchanged projecting to the cavoatrial level. No aggressive osseous abnormality. IMPRESSION: Interval slight worsening of right base pulmonary opacity since most recent exam. Electronically Signed   By: Ashley Royalty M.D.   On: 12/10/2018 23:37   Dg Chest Port 1 View  Result Date: 12/16/2018 CLINICAL DATA:  Fever and shortness of breath today. On  chemotherapy for lung cancer. EXAM: PORTABLE CHEST 1 VIEW COMPARISON:  Chest radiograph October 15, 2018 FINDINGS: Worsening opacification RIGHT hemithorax with air bronchograms. Interstitial prominence LEFT lung increased from prior examination. No LEFT pleural effusion or focal consolidation. Cardiac silhouette is predominantly obscured. No pneumothorax. Single-lumen RIGHT chest Port-A-Cath via RIGHT internal jugular venous approach with distal tip projecting distal superior vena cava. No pneumothorax. Old LEFT rib fracture. IMPRESSION: 1. Worsening opacity of RIGHT lung concerning disease progression though given increased interstitial prominence on the LEFT, atypical infection or pulmonary edema possible. Electronically Signed   By: Elon Alas M.D.   On: 12/02/2018 20:53    Pending Labs Unresulted Labs (From admission, onward)    Start     Ordered   12/13/2018 2106  Lactic acid, plasma  Now then every 2 hours,   STAT     12/11/2018 2106   12/23/2018 2106  Blood Culture (routine x 2)  BLOOD CULTURE X 2,   STAT     12/17/2018 2106   12/07/2018 2106  Urinalysis, Routine w reflex microscopic  ONCE - STAT,   STAT     12/03/2018 2106   Signed and Held  Basic metabolic panel  Tomorrow morning,   R     Signed and Held   Signed and Held  CBC  Tomorrow morning,   R     Signed and Held          Vitals/Pain Today's Vitals   12/11/2018 2059 12/25/2018 2130 12/20/2018 2145 12/12/2018 2200  BP:    126/77  Pulse:  (!) 123 (!) 115 (!) 111  Resp:  (!) 26 (!) 34 (!) 24  Temp:      TempSrc:      SpO2:  94% 91% 90%  Weight: 60.3 kg     Height: 5\' 6"  (1.676 m)     PainSc:        Isolation Precautions Droplet precaution  Medications Medications  metroNIDAZOLE (FLAGYL) IVPB 500 mg ( Intravenous Stopped 12/05/2018 2348)  HYDROmorphone (DILAUDID) tablet 2-4 mg (has no administration in time range)  rivaroxaban (XARELTO) tablet 20 mg (has no administration in time range)  ceFEPIme (MAXIPIME) 2 g in sodium  chloride 0.9 % 100 mL IVPB (0 g Intravenous Stopped 12/24/2018 2224)  sodium chloride 0.9 % bolus 1,000 mL (0 mLs Intravenous Stopped 12/12/2018 2232)    And  sodium chloride 0.9 % bolus 1,000 mL (0 mLs Intravenous Stopped 12/23/2018 2324)  vancomycin (VANCOCIN) 1,500 mg in sodium chloride 0.9 % 500 mL IVPB ( Intravenous Rate/Dose Change 11/26/2018 2358)    Mobility walks Low fall risk   Focused Assessments Pulmonary Assessment Handoff:  Lung sounds: Bilateral Breath Sounds: Coarse crackles L Breath Sounds: Diminished R Breath Sounds: Diminished O2 Device: Room Air        R Recommendations: See Admitting Provider Note  Report given to:   Additional Notes: Wife, Lenna Sciara, at bedside

## 2018-12-02 NOTE — ED Notes (Signed)
Diandra, RN called for pt update

## 2018-12-02 NOTE — Consult Note (Signed)
[place]  Telephone:([phone]) [phone] [phone]) [phone]  ID: Ruben Eaton OB: Jun 05, 1971  MR#: [id number]  JEH#:[id number]  Patient Care Team: Dion Body, MD as PCP - General (Family Medicine) [name], MD as PCP - Cardiology (Cardiology) Telford Nab, RN as Registered Nurse  CHIEF COMPLAINT: Stage IV lung cancer.  Admitted with fever, shortness of breath, possible pneumonia.  INTERVAL HISTORY: Patient is a 48 year old male who is actively receiving treatment for stage IV lung cancer who was recently admitted through the emergency room with increasing shortness of breath, fevers, concern for underlying pneumonia.  He feels improved since admission, but not back to baseline.  He has no neurologic complaints.  He continues to have mild shortness of breath, but denies any chest pain, hemoptysis, or cough.  He has a fair appetite.  He denies any nausea, vomiting, constipation, or diarrhea.  He has no urinary complaints.  Patient offers no further specific complaints today.  REVIEW OF SYSTEMS:   Review of Systems  Constitutional: Positive for fever and malaise/fatigue. Negative for weight loss.  Respiratory: Positive for shortness of breath. Negative for cough and hemoptysis.   Cardiovascular: Negative.  Negative for chest pain and leg swelling.  Gastrointestinal: Negative.  Negative for abdominal pain.  Genitourinary: Negative.  Negative for dysuria.  Musculoskeletal: Negative.  Negative for back pain.  Skin: Negative.  Negative for rash.  Neurological: Negative.  Negative for dizziness, focal weakness, weakness and headaches.  Psychiatric/Behavioral: Negative.  The patient is not nervous/anxious.     As per HPI. Otherwise, a complete review of systems is negative.  PAST MEDICAL HISTORY: Past Medical History:  Diagnosis Date  . Anorexia   . 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 09/15/2016 in setting of PFO.  . CVA (cerebral vascular accident)  ([place]) 09/07/2016  . ED (erectile dysfunction)   . Essential (primary) hypertension 02/01/2016  . History of stroke 10/08/2016  . Hx of insomnia   . Hypertension   . OSA on CPAP   . PFO (patent foramen ovale)    a. s/p closure 09/2016 by Dr. [name].  . Pleural effusion on right 02/21/2018  . Primary cancer of right lower lobe of lung ([place]) 02/25/2018   Chemo tx's and rad tx's  . PVC's (premature ventricular contractions) 10/08/2016  . 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 TIA (transient ischemic attack) 09/07/2016    PAST SURGICAL HISTORY: Past Surgical History:  Procedure Laterality Date  . APPENDECTOMY  1980  . EYE SURGERY    . PATENT FORAMEN OVALE CLOSURE  10/07/2016  . PATENT FORAMEN OVALE CLOSURE N/A 10/07/2016   Procedure: Patent Forament Ovale(PFO) Closure;  Surgeon: [name], MD;  Location: [place] CV LAB;  Service: Cardiovascular;  Laterality: N/A;  . PORTACATH PLACEMENT N/A 02/23/2018   Procedure: INSERTION PORT-A-CATH;  Surgeon: Nestor Lewandowsky, MD;  Location: [facility] ORS;  Service: Thoracic;  Laterality: N/A;  . STRABISMUS SURGERY Left 1984  . TEE WITHOUT CARDIOVERSION N/A 09/08/2016   Procedure: TRANSESOPHAGEAL ECHOCARDIOGRAM (TEE);  Surgeon: [name], MD;  Location: [facility] ORS;  Service: Cardiovascular;  Laterality: N/A;  . VIDEO ASSISTED THORACOSCOPY (VATS) W/TALC PLEUADESIS Right 02/23/2018   Procedure: VIDEO ASSISTED THORACOSCOPY (VATS) W/TALC PLEUADESIS;  Surgeon: Nestor Lewandowsky, MD;  Location: [facility] ORS;  Service: Thoracic;  Laterality: Right;  . VITRECTOMY Right 2013    FAMILY HISTORY: Family History  Problem Relation Age of Onset  . Other Paternal Uncle        Abdominal  tumor     ADVANCED DIRECTIVES (Y/N):  @ADVDIR @  HEALTH MAINTENANCE: Social History   Tobacco Use  . Smoking status: Former Smoker    Packs/day: 0.50    Years: 10.00    Pack years: 5.00    Last attempt to quit: 2011    Years since quitting: 9.1  . Smokeless  tobacco: Former Systems developer    Types: Snuff  Substance Use Topics  . Alcohol use: Yes    Alcohol/week: 3.0 standard drinks    Types: 3 Cans of beer per week  . Drug use: No     Colonoscopy:  PAP:  Bone density:  Lipid panel:  No Known Allergies  Current Facility-Administered Medications  Medication Dose Route Frequency Provider Last Rate Last Dose  . 0.9 %  sodium chloride infusion   Intravenous PRN [name], MD   Stopped at 12/02/18 [phone]  . acetaminophen (TYLENOL) tablet 650 mg  650 mg Oral Q6H PRN [name], MD       Or  . acetaminophen (TYLENOL) suppository 650 mg  650 mg Rectal Q6H PRN [name], MD      . benzonatate (TESSALON) capsule 200 mg  200 mg Oral TID PRN [name], MD      . ceFEPIme (MAXIPIME) 2 g in sodium chloride 0.9 % 100 mL IVPB  2 g Intravenous Driscilla Moats, MD 200 mL/hr at 12/02/18 0645 2 g at 12/02/18 0645  . DULoxetine (CYMBALTA) DR capsule 30 mg  30 mg Oral Daily [name], MD   30 mg at 12/02/18 0919  . furosemide (LASIX) tablet 20 mg  20 mg Oral Daily PRN [name], MD   20 mg at 12/02/18 0919  . guaiFENesin-dextromethorphan (ROBITUSSIN DM) 100-10 MG/5ML syrup 5 mL  5 mL Oral Q4H PRN [name], MD      . HYDROmorphone (DILAUDID) tablet 2-4 mg  2-4 mg Oral Q4H PRN [name], MD   2 mg at 12/02/18 0540  . ibuprofen (ADVIL,MOTRIN) tablet 800 mg  800 mg Oral Daily PRN [name], MD      . ipratropium-albuterol (DUONEB) 0.5-2.5 (3) MG/3ML nebulizer solution 3 mL  3 mL Nebulization Q4H PRN [name], MD      . LORazepam (ATIVAN) tablet 0.5 mg  0.5 mg Oral Q8H PRN [name], MD   0.5 mg at 12/02/18 0131  . metoprolol tartrate (LOPRESSOR) tablet 12.5 mg  12.5 mg Oral BID [name], MD   12.5 mg at 12/02/18 0931  . morphine (MS CONTIN) 12 hr tablet 30 mg  30 mg Oral Q12H [name], MD   30 mg at 12/02/18 0305  . ondansetron (ZOFRAN) tablet 4 mg  4 mg Oral Q6H PRN [name], MD       Or  . ondansetron [facility])  injection 4 mg  4 mg Intravenous Q6H PRN [name], MD      . rivaroxaban Alveda Reasons) tablet 20 mg  20 mg Oral Q supper [name], MD   20 mg at 12/02/18 0118  . vancomycin (VANCOCIN) 1,250 mg in sodium chloride 0.9 % 250 mL IVPB  1,250 mg Intravenous Laurence Spates, MD 166.7 mL/hr at 12/02/18 0920 1,250 mg at 12/02/18 0920  . zolpidem (AMBIEN) tablet 10 mg  10 mg Oral QHS PRN [name], MD        OBJECTIVE: Vitals:   12/02/18 0056 12/02/18 0735  BP: 112/86 118/84  Pulse: (!) 108 (!) 108  Resp: 20 20  Temp: 98.5  F (36.9 C) (!) 97.3 F (36.3 C)  SpO2: 94% 100%     Body mass index is 22.11 kg/m.    ECOG FS:1 - Symptomatic but completely ambulatory  General: Well-developed, well-nourished, no acute distress. Eyes: Pink conjunctiva, anicteric sclera. HEENT: Normocephalic, moist mucous membranes, clear oropharnyx. Lungs: Clear to auscultation bilaterally. Heart: Regular rate and rhythm. No rubs, murmurs, or gallops. Abdomen: Soft, nontender, nondistended. No organomegaly noted, normoactive bowel sounds. Musculoskeletal: No edema, cyanosis, or clubbing. Neuro: Alert, answering all questions appropriately. Cranial nerves grossly intact. Skin: No rashes or petechiae noted. Psych: Normal affect. Lymphatics: No cervical, calvicular, axillary or inguinal LAD.   LAB RESULTS:  Lab Results  Component Value Date   NA 131 (L) 12/02/2018   K 3.7 12/02/2018   CL 98 12/02/2018   CO2 25 12/02/2018   GLUCOSE 81 12/02/2018   BUN 12 12/02/2018   CREATININE 0.53 (L) 12/02/2018   CALCIUM 8.0 (L) 12/02/2018   PROT 6.2 (L) 11/28/2018   ALBUMIN 2.6 (L) 12/06/2018   AST 15 12/16/2018   ALT 17 12/13/2018   ALKPHOS 146 (H) 12/17/2018   BILITOT 0.9 12/25/2018   GFRNONAA >60 12/02/2018   GFRAA >60 12/02/2018    Lab Results  Component Value Date   WBC 13.0 (H) 12/02/2018   NEUTROABS 9.6 (H) 12/25/2018   HGB 9.4 (L) 12/02/2018   HCT 29.2 (L) 12/02/2018   MCV 97.7 12/02/2018    PLT 106 (L) 12/02/2018     STUDIES: Dg Chest Portable 1 View  Result Date: 12/17/2018 CLINICAL DATA:  Severe dyspnea.  History of cancer. EXAM: PORTABLE CHEST 1 VIEW COMPARISON:  12/19/2018 at 1721 hours FINDINGS: Near complete opacification of the right hemithorax sparing the right hilar region. Slight interval worsening of pulmonary consolidation noted at the right base since prior. Mild interstitial edema the left lung persists. Obscuration of the right heart border is noted. Port catheter tip is unchanged projecting to the cavoatrial level. No aggressive osseous abnormality. IMPRESSION: Interval slight worsening of right base pulmonary opacity since most recent exam. Electronically Signed   By: Ashley Royalty M.D.   On: 12/18/2018 23:37   Dg Chest Port 1 View  Result Date: 12/24/2018 CLINICAL DATA:  Fever and shortness of breath today. On chemotherapy for lung cancer. EXAM: PORTABLE CHEST 1 VIEW COMPARISON:  Chest radiograph October 15, 2018 FINDINGS: Worsening opacification RIGHT hemithorax with air bronchograms. Interstitial prominence LEFT lung increased from prior examination. No LEFT pleural effusion or focal consolidation. Cardiac silhouette is predominantly obscured. No pneumothorax. Single-lumen RIGHT chest Port-A-Cath via RIGHT internal jugular venous approach with distal tip projecting distal superior vena cava. No pneumothorax. Old LEFT rib fracture. IMPRESSION: 1. Worsening opacity of RIGHT lung concerning disease progression though given increased interstitial prominence on the LEFT, atypical infection or pulmonary edema possible. Electronically Signed   By: Elon Alas M.D.   On: 12/14/2018 20:53    ASSESSMENT: Stage IV lung cancer.  Admitted with fever, shortness of breath, possible pneumonia.  PLAN:    1.  Stage IV lung cancer: Patient recently had concern for clinical worsening/progression of disease and was initiated on Taxotere and Cyramza on November 22, 2018.  He has been  instructed to keep his previously scheduled follow-up appointment on December 12, 2018 for consideration of cycle 2 of treatment. 2.  Shortness of breath/possible pneumonia: Continue current antibiotics. 3.  Leukocytosis: Likely reactive.  Continue antibiotics as above. 4.  Anemia/thrombocytopenia: Likely secondary to recent chemotherapy.  Monitor.  Appreciate consult,  will follow.  Cancer Staging Primary cancer of right lower lobe of lung [facility]) Staging form: Lung, AJCC 8th Edition - Clinical stage from 03/02/2018: Stage IV (cT4, cN0, pM1a) - Unsigned   Lloyd Huger, MD   12/02/2018 11:01 AM

## 2018-12-03 ENCOUNTER — Inpatient Hospital Stay: Payer: 59

## 2018-12-03 ENCOUNTER — Encounter: Payer: Self-pay | Admitting: Oncology

## 2018-12-03 ENCOUNTER — Inpatient Hospital Stay: Payer: 59 | Attending: Internal Medicine

## 2018-12-03 ENCOUNTER — Encounter: Payer: Self-pay | Admitting: Radiology

## 2018-12-03 DIAGNOSIS — J189 Pneumonia, unspecified organism: Secondary | ICD-10-CM

## 2018-12-03 LAB — CBC
HEMATOCRIT: 26.6 % — AB (ref 39.0–52.0)
Hemoglobin: 8.7 g/dL — ABNORMAL LOW (ref 13.0–17.0)
MCH: 31.9 pg (ref 26.0–34.0)
MCHC: 32.7 g/dL (ref 30.0–36.0)
MCV: 97.4 fL (ref 80.0–100.0)
Platelets: 97 10*3/uL — ABNORMAL LOW (ref 150–400)
RBC: 2.73 MIL/uL — ABNORMAL LOW (ref 4.22–5.81)
RDW: 14.3 % (ref 11.5–15.5)
WBC: 10 10*3/uL (ref 4.0–10.5)
nRBC: 0 % (ref 0.0–0.2)

## 2018-12-03 MED ORDER — PRO-STAT SUGAR FREE PO LIQD
30.0000 mL | Freq: Two times a day (BID) | ORAL | Status: DC
  Administered 2018-12-03 – 2018-12-06 (×8): 30 mL via ORAL

## 2018-12-03 MED ORDER — ADULT MULTIVITAMIN W/MINERALS CH
1.0000 | ORAL_TABLET | Freq: Every day | ORAL | Status: DC
  Administered 2018-12-03 – 2018-12-06 (×4): 1 via ORAL
  Filled 2018-12-03 (×4): qty 1

## 2018-12-03 MED ORDER — POLYETHYLENE GLYCOL 3350 17 G PO PACK
17.0000 g | PACK | Freq: Every day | ORAL | Status: DC | PRN
  Administered 2018-12-04: 17 g via ORAL
  Filled 2018-12-03 (×2): qty 1

## 2018-12-03 MED ORDER — IOHEXOL 300 MG/ML  SOLN
75.0000 mL | Freq: Once | INTRAMUSCULAR | Status: AC | PRN
  Administered 2018-12-03: 75 mL via INTRAVENOUS

## 2018-12-03 NOTE — Plan of Care (Signed)
  Problem: Health Behavior/Discharge Planning: Goal: Ability to manage health-related needs will improve Outcome: Not Progressing Note:  Patient w/ a R.U.E. D.V.T. discovered today. Already on Xarelto. Patient given a heating pad and instructed to keep extremity elevated. Will continue to monitor for any further test results. Wenda Low [facility]

## 2018-12-03 NOTE — Assessment & Plan Note (Addendum)
#  48 year old male patient with adenocarcinoma of the lung stage IV currently on Taxotere-Cyramza is admitted to hospital for progressive shortness of breath/fever  # Non-small cell lung cancer/stage IV-currently status post Taxotere Cyramza approximately 10 days ago-approximately 10 days ago.  CT scan March 9-shows progressive right lung disease and also new nodules on the left upper lobe/small pleural effusion.  No further therapy/hospice  #Mental status changes-no focal deficits-question narcotic induced.  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  Stable  #Chest wall pain continue current medication.  Stable  #Patient is currently DNR/DNI.  Currently awaiting hospice bed.  Discussed with [employer].

## 2018-12-03 NOTE — Progress Notes (Signed)
[facility] [place] NAME: Ruben Eaton    MR#:  [id number]  DATE OF BIRTH:  [birth year]  SUBJECTIVE:   No acute events overnight, remains afebrile.  Has some right upper extremity swelling and Dopplers consistent with a nonocclusive superficial thrombus in the brachial vein.  Patient also underwent CT of the chest which is suggestive of progression of his lung cancer.  Patient denies any worsening shortness of breath or cough.  REVIEW OF SYSTEMS:    Review of Systems  Constitutional: Negative for chills and fever.  HENT: Negative for congestion and tinnitus.   Eyes: Negative for blurred vision and double vision.  Respiratory: Negative for cough, shortness of breath and wheezing.   Cardiovascular: Negative for chest pain, orthopnea and PND.  Gastrointestinal: Negative for abdominal pain, diarrhea, nausea and vomiting.  Genitourinary: Negative for dysuria and hematuria.  Neurological: Negative for dizziness, sensory change and focal weakness.  All other systems reviewed and are negative.   Nutrition: Heart Healthy Tolerating Diet: Yes Tolerating PT: await Eval.    DRUG ALLERGIES:  No Known Allergies  VITALS:  Blood pressure 99/75, pulse (!) 106, temperature 98.6 F (37 C), temperature source Oral, resp. rate 16, height _0  (1.676 m), weight 61.6 kg, SpO2 95 %.  PHYSICAL EXAMINATION:   Physical Exam  GENERAL:  48 y.o.-year-old patient lying in bed in no acute distress.  EYES: Pupils equal, round, reactive to light and accommodation. No scleral icterus. Extraocular muscles intact.  HEENT: Head atraumatic, normocephalic. Oropharynx and nasopharynx clear.  NECK:  Supple, no jugular venous distention. No thyroid enlargement, no tenderness.  LUNGS: Poor or no a/e on Right lung fields, no wheezing, rales, rhonchi. No use of accessory muscles of respiration.  CARDIOVASCULAR: S1, S2 normal. No murmurs, rubs, or gallops. Right chest  wall port-a-cath in place.   ABDOMEN: Soft, nontender, nondistended. Bowel sounds present. No organomegaly or mass.  EXTREMITIES: No cyanosis, clubbing or edema b/l.    NEUROLOGIC: Cranial nerves II through XII are intact. No focal Motor or sensory deficits b/l.   PSYCHIATRIC: The patient is alert and oriented x 3.  SKIN: No obvious rash, lesion, or ulcer.    LABORATORY PANEL:   CBC Recent Labs  Lab 12/03/18 0256  WBC 10.0  HGB 8.7*  HCT 26.6*  PLT 97*   ------------------------------------------------------------------------------------------------------------------  Chemistries  Recent Labs  Lab 12/06/2018 2112 12/02/18 0110  NA 130* 131*  K 3.8 3.7  CL 94* 98  CO2 26 25  GLUCOSE 88 81  BUN 12 12  CREATININE 0.57* 0.53*  CALCIUM 8.8* 8.0*  AST 15  --   ALT 17  --   ALKPHOS 146*  --   BILITOT 0.9  --    ------------------------------------------------------------------------------------------------------------------  Cardiac Enzymes No results for input(s): TROPONINI in the last 168 hours. ------------------------------------------------------------------------------------------------------------------  RADIOLOGY:  Ct Chest W Contrast  Result Date: 12/03/2018 CLINICAL DATA:  Stage IV adenocarcinoma of the lung. EXAM: CT CHEST WITH CONTRAST TECHNIQUE: Multidetector CT imaging of the chest was performed during intravenous contrast administration. CONTRAST:  26m OMNIPAQUE IOHEXOL 300 MG/ML  SOLN COMPARISON:  10/09/2018 FINDINGS: Cardiovascular: The heart size is normal. No substantial pericardial effusion. Persistent mass-effect on the inferior left atrium. Mediastinum/Nodes: Interval progression of right apical tumor with incorporation of the 12 mm right paratracheal lymph node measured on the prior study. Mediastinal component posterior to the heart measures 7.6 x 10.9 cm today compared to 6.6 x 8.8 cm  when measured at the same level and in the same axes on the prior  study. Lungs/Pleura: Marked involvement of the right thoracic wall/pleural space again noted. Anterior component measured previously at 6.3 x 4.3 cm is now 8.3 x 4.9 cm. Mass-effect on the mediastinum evident with leftward displacement. Similar appearance of mass-effect on right middle and lower lobe airways. Very little residual aerated right lung evident on today's study compatible with tumor progression. New areas of left lung involvement identified on today's exam including irregular 1.8 x 1.4 cm left lower lobe nodule and 1.9 x 1.5 cm anterior left upper lobe nodule. Numerous other tiny left lung nodules evident with new pleural nodularity diffusely in the left hemithorax. Small left pleural effusion is new in the interval. Upper Abdomen: Although incompletely visualize, there is probable necrotic lymphadenopathy in the upper abdomen which appears new since 10/24/2018. Musculoskeletal: No worrisome lytic or sclerotic osseous abnormality. IMPRESSION: 1. Interval progression of the disease in the right hemithorax with almost no residual aerated right lung on today's exam. 2. Interval development of diffuse pleural nodularity in the left hemithorax with new areas of irregular nodularity in the parenchyma of the left lung. 3. New small left pleural effusion. 4. Although incompletely visualized, there appears to be necrotic lymphadenopathy in the upper abdomen, new since 10/24/2018. Electronically Signed   By: Misty Stanley M.D.   On: 12/03/2018 09:13   US Venous Img Upper Uni Right  Result Date: 12/03/2018 CLINICAL DATA:  Right upper extremity edema. History of lung cancer. History of pulmonary embolism. Evaluate for DVT. EXAM: RIGHT UPPER EXTREMITY VENOUS DOPPLER ULTRASOUND TECHNIQUE: Gray-scale sonography with graded compression, as well as color Doppler and duplex ultrasound were performed to evaluate the upper extremity deep venous system from the level of the subclavian vein and including the jugular,  axillary, basilic, radial, ulnar and upper cephalic vein. Spectral Doppler was utilized to evaluate flow at rest and with distal augmentation maneuvers. COMPARISON:  None. FINDINGS: Contralateral Subclavian Vein: Respiratory phasicity is normal and symmetric with the symptomatic side. No evidence of thrombus. Normal compressibility. Internal Jugular Vein: No evidence of thrombus. Normal compressibility, respiratory phasicity and response to augmentation. Subclavian Vein: No evidence of thrombus. Normal compressibility, respiratory phasicity and response to augmentation. Axillary Vein: No evidence of thrombus. Normal compressibility, respiratory phasicity and response to augmentation. Cephalic Vein: No evidence of thrombus. Normal compressibility, respiratory phasicity and response to augmentation. Basilic Vein: No evidence of thrombus. Normal compressibility, respiratory phasicity and response to augmentation. Brachial Veins: There is short-segment age-indeterminate nonocclusive wall thickening involving 1 of the paired brachial veins (image 22 and 23). The adjacent brachial vein appears widely patent. Radial Veins: No evidence of thrombus. Normal compressibility, respiratory phasicity and response to augmentation. Ulnar Veins: No evidence of thrombus. Normal compressibility, respiratory phasicity and response to augmentation. Venous Reflux:  None visualized. Other Findings:  None visualized. IMPRESSION: 1. Examination is positive for Short-segment age-indeterminate nonocclusive wall thickening/DVT involving one of the paired right brachial veins. The adjacent paired brachial vein appears widely patent. 2. Otherwise, normal right upper extremity venous Doppler ultrasound. Electronically Signed   By: Sandi Mariscal M.D.   On: 12/03/2018 09:59   Dg Chest Portable 1 View  Result Date: 11/30/2018 CLINICAL DATA:  Severe dyspnea.  History of cancer. EXAM: PORTABLE CHEST 1 VIEW COMPARISON:  11/26/2018 at 1721 hours  FINDINGS: Near complete opacification of the right hemithorax sparing the right hilar region. Slight interval worsening of pulmonary consolidation noted at the right base since  prior. Mild interstitial edema the left lung persists. Obscuration of the right heart border is noted. Port catheter tip is unchanged projecting to the cavoatrial level. No aggressive osseous abnormality. IMPRESSION: Interval slight worsening of right base pulmonary opacity since most recent exam. Electronically Signed   By: Ashley Royalty M.D.   On: 12/12/2018 23:37   Dg Chest Port 1 View  Result Date: 12/19/2018 CLINICAL DATA:  Fever and shortness of breath today. On chemotherapy for lung cancer. EXAM: PORTABLE CHEST 1 VIEW COMPARISON:  Chest radiograph October 15, 2018 FINDINGS: Worsening opacification RIGHT hemithorax with air bronchograms. Interstitial prominence LEFT lung increased from prior examination. No LEFT pleural effusion or focal consolidation. Cardiac silhouette is predominantly obscured. No pneumothorax. Single-lumen RIGHT chest Port-A-Cath via RIGHT internal jugular venous approach with distal tip projecting distal superior vena cava. No pneumothorax. Old LEFT rib fracture. IMPRESSION: 1. Worsening opacity of RIGHT lung concerning disease progression though given increased interstitial prominence on the LEFT, atypical infection or pulmonary edema possible. Electronically Signed   By: Elon Alas M.D.   On: 12/05/2018 20:53     ASSESSMENT AND PLAN:   48 year old male with past medical history of stage IV lung cancer, previous TIA, hypertension, obstructive sleep apnea, history of previous CVA who presented to the hospital due to fever, shortness of breath.  1.  Sepsis-patient met criteria on admission given his fever, tachycardia, leukocytosis and chest x-ray findings suggestive of pneumonia. Continue IV cefepime, follow cultures which are currently negative.  2.  Pneumonia- suspected to be HCAP.  - cont. IV  cefepime, follow cultures.  Continue O2 supplementation and supportive care.  3.  Lung cancer-patient has stage IV lung cancer currently ongoing treatment with Dr. Rogue Bussing. -Oncology repeated a CT chest today which shows progression of his malignancy.  CT chest showing complete atelectasis of the right lung.  Pulmonary consult obtained and they do not think that bronchoscopy would be beneficial this is likely tumor compressing the right lung rather than mucous plugging. -Continue supportive care for now.  Oncology to have further discussions with patient about what options he has available as he has not responded to previous chemotherapy radiation. -Prognosis is quite poor and consider goals of care/palliative care consult.  4.  Anxiety/depression-continue Ativan, Cymbalta.  5. Essential HTN - cont. Metoprolol  6. Hx of previous PE - cont. Xarelto.   7. Superficial Right upper Ext. VTE - cont. Xarelto, keep arm elevated, Warm compress and supportive care.    All the records are reviewed and case discussed with Care Management/Social Worker. Management plans discussed with the patient, family and they are in agreement.  CODE STATUS: Partial Code  DVT Prophylaxis: Xarelto  TOTAL TIME TAKING CARE OF THIS PATIENT: 30 minutes.   POSSIBLE D/C IN 2-3 DAYS, DEPENDING ON CLINICAL CONDITION.   Henreitta Leber M.D on 12/03/2018 at 2:47 PM  Between 7am to 6pm - Pager - [phone]  After 6pm go to [email or web address] - Proofreader  [facility]  Office  [phone]  CC: Primary care physician; Dion Body, MD

## 2018-12-03 NOTE — Consult Note (Signed)
[facility] Consultation      Assessment and Plan:  Right lung atelectasis with extensive lung cancer. Stage IV lung cancer with continued progression - Review of a CT chest shows complete right lung atelectasis due to tumor involvement involving much/all of the right lung, this appears to have progressed from previous CAT scan from 10/09/2018 when about one half to two thirds of the right lung appeared to be atelectatic and involved by tumor.  I do not see any evidence of mucous plug which could be reversible via bronchoscopy. In regards to the patient's left lung there appears to be reduced lung volumes due to increased mediastinal shift, new infiltrates in the left lung as well as increased pleural fluid/pleural thickening. - Wean down oxygen as tolerated. - Prognosis appears poor, palliative care may be helpful in establishing goals of care.  Pneumonia. - New infiltrates in the left lung compared to previous May be suggestive of pneumonia. - Continue antibiotics.  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 - Continue Xarelto.  Date: 12/03/2018  MRN# [id number] Ruben Eaton [birth year]  Referring Physician:  Dr. Verdell Carmine for right lung atelectasis.   Ruben Eaton is a 48 y.o. old male seen in consultation for chief complaint of:    Chief Complaint  Patient presents with  . Code Sepsis  . Shortness of Breath  . Fever    HPI:   The patient is a 48 year old male with a history of stage IV lung cancer.  Disease was noted when he underwent thoracentesis for right-sided pleural effusion which was positive for malignancy.  He subsequently was seen by Dr. Genevive Bi, and on 02/23/2018 underwent bronchoscopy with right thoracoscopy with talc pleurodesis, pleural biopsy, Pleurx catheter insertion, Port-A-Cath insertion.  He was found to have high-grade lung cancer with sarcomatoid features.  His cancer progressed  despite chemotherapy and palliative radiation therapy. He was found to have a chest wall mass and underwent biopsy on 08/02/2018, consistent with metastatic disease.  His course was complicated subsequently by development of the left lung pulmonary embolism for which she was started on Lovenox.  Patient presented to the hospital on 12/12/2018 with fever, dyspnea.  Initial oxygen saturation on room air was 84%, currently 95% on 2.5 L.  Initial temp on admission was 102.4, he has subsequently been afebrile. Currently labs show mild leukocytosis with left shift.  Influenza a and B are negative as is a viral respiratory panel. Upper extremity ultrasound was positive for brachial vein thrombosis which was nonocclusive.  **CT chest 12/04/2018>> imaging personally reviewed, there is complete right lung atelectasis, this appears to be predominantly due to massive infiltration of the right lung by tumor as well as very large subcarinal tumor.  There is also displacement of the mediastinum towards the left with infiltrate seen in the left lung and pleural thickening/effusion which is moderate in size.  Review of previous chest x-ray shows significant progression from 1/28 chest x-ray and CT chest from 10/09/2018 when about one half of the right lung appeared to be atelectatic and involved by tumor.  PMHX:   Past Medical History:  Diagnosis Date  . Anorexia   . 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 09/15/2016 in setting of PFO.  . CVA (cerebral vascular accident) ([place]) 09/07/2016  . ED (erectile dysfunction)   . Essential (primary) hypertension 02/01/2016  . History of stroke 10/08/2016  . Hx of insomnia   . Hypertension   . OSA on  CPAP   . PFO (patent foramen ovale)    a. s/p closure 09/2016 by Dr. [name].  . Pleural effusion on right 02/21/2018  . Primary cancer of right lower lobe of lung ([place]) 02/25/2018   Chemo tx's and rad tx's  . PVC's (premature ventricular contractions) 10/08/2016  . Quadriceps  tendinitis 02/01/2016   Superior spurring   . TIA (transient ischemic attack) 09/07/2016   Surgical Hx:  Past Surgical History:  Procedure Laterality Date  . APPENDECTOMY  1980  . EYE SURGERY    . PATENT FORAMEN OVALE CLOSURE  10/07/2016  . PATENT FORAMEN OVALE CLOSURE N/A 10/07/2016   Procedure: Patent [place](PFO) Closure;  Surgeon: [name], MD;  Location: [facility];  Service: Cardiovascular;  Laterality: N/A;  . PORTACATH PLACEMENT N/A 02/23/2018   Procedure: INSERTION PORT-A-CATH;  Surgeon: Nestor Lewandowsky, MD;  Location: [facility];  Service: Thoracic;  Laterality: N/A;  . STRABISMUS SURGERY Left 1984  . TEE WITHOUT CARDIOVERSION N/A 09/08/2016   Procedure: TRANSESOPHAGEAL ECHOCARDIOGRAM (TEE);  Surgeon: [name], MD;  Location: [facility];  Service: Cardiovascular;  Laterality: N/A;  . VIDEO ASSISTED THORACOSCOPY (VATS) W/TALC PLEUADESIS Right 02/23/2018   Procedure: VIDEO ASSISTED THORACOSCOPY (VATS) W/TALC PLEUADESIS;  Surgeon: Nestor Lewandowsky, MD;  Location: [facility];  Service: Thoracic;  Laterality: Right;  . VITRECTOMY Right 2013   Family Hx:  Family History  Problem Relation Age of Onset  . Other Paternal Uncle        Abdominal tumor    Social Hx:   Social History   Tobacco Use  . Smoking status: Former Smoker    Packs/day: 0.50    Years: 10.00    Pack years: 5.00    Last attempt to quit: 2011    Years since quitting: 9.1  . Smokeless tobacco: Former Systems developer    Types: Snuff  Substance Use Topics  . Alcohol use: Yes    Alcohol/week: 3.0 standard drinks    Types: 3 Cans of beer per week  . Drug use: No   Medication:    Current Facility-Administered Medications:  .  0.9 %  sodium chloride infusion, , Intravenous, PRN, [name], MD, Stopped at 12/03/18 [phone] .  acetaminophen (TYLENOL) tablet 650 mg, 650 mg, Oral, Q6H PRN **OR** acetaminophen (TYLENOL) suppository 650 mg, 650 mg, Rectal, Q6H PRN, [name], MD .  benzonatate (TESSALON)  capsule 200 mg, 200 mg, Oral, TID PRN, [name], MD .  ceFEPIme (MAXIPIME) 2 g in sodium chloride 0.9 % 100 mL IVPB, 2 g, Intravenous, Q8H, [name], MD, Last Rate: 200 mL/hr at 12/03/18 0631 .  DULoxetine (CYMBALTA) DR capsule 30 mg, 30 mg, Oral, Daily, [name], MD, 30 mg at 12/03/18 0936 .  feeding supplement (PRO-STAT SUGAR FREE 64) liquid 30 mL, 30 mL, Oral, BID, Sainani, Belia Heman, MD, 30 mL at 12/03/18 1132 .  furosemide (LASIX) tablet 20 mg, 20 mg, Oral, Daily PRN, [name], MD, 20 mg at 12/02/18 0919 .  guaiFENesin-dextromethorphan (ROBITUSSIN DM) 100-10 MG/5ML syrup 5 mL, 5 mL, Oral, Q4H PRN, [name], MD .  HYDROmorphone (DILAUDID) tablet 2-4 mg, 2-4 mg, Oral, Q4H PRN, [name], MD, 4 mg at 12/03/18 0820 .  ibuprofen (ADVIL,MOTRIN) tablet 800 mg, 800 mg, Oral, Daily PRN, [name], MD .  ipratropium-albuterol (DUONEB) 0.5-2.5 (3) MG/3ML nebulizer solution 3 mL, 3 mL, Nebulization, Q4H PRN, [name], MD .  LORazepam (ATIVAN) tablet 0.5 mg, 0.5 mg, Oral, Q8H PRN, [name], MD,  0.5 mg at 12/02/18 0131 .  metoprolol tartrate (LOPRESSOR) tablet 12.5 mg, 12.5 mg, Oral, BID, [name], MD, 12.5 mg at 12/02/18 0931 .  morphine (MS CONTIN) 12 hr tablet 30 mg, 30 mg, Oral, Q12H, [name], MD, 30 mg at 12/03/18 9628 .  multivitamin with minerals tablet 1 tablet, 1 tablet, Oral, Daily, Sainani, Vivek J, MD .  ondansetron (ZOFRAN) tablet 4 mg, 4 mg, Oral, Q6H PRN **OR** ondansetron (ZOFRAN) injection 4 mg, 4 mg, Intravenous, Q6H PRN, [name], MD .  phenol (CHLORASEPTIC) mouth spray 1 spray, 1 spray, Mouth/Throat, PRN, Henreitta Leber, MD, 1 spray at 12/02/18 1921 .  rivaroxaban (XARELTO) tablet 20 mg, 20 mg, Oral, Q supper, [name], MD, 20 mg at 12/02/18 1711 .  zolpidem (AMBIEN) tablet 10 mg, 10 mg, Oral, QHS PRN, [name], MD   Allergies:  Patient has no known allergies.  Review of Systems: Gen:  Denies  fever, sweats,  chills HEENT: Denies blurred vision, double vision. bleeds, sore throat Cvc:  No dizziness, chest pain. Resp:   Denies cough or sputum production, shortness of breath Gi: Denies swallowing difficulty, stomach pain. Gu:  Denies bladder incontinence, burning urine Ext:   No Joint pain, stiffness. Skin: No skin rash,  hives  Endoc:  No polyuria, polydipsia. Psych: No depression, insomnia. Other:  All other systems were reviewed with the patient and were negative other that what is mentioned in the HPI.   Physical Examination:   VS: BP 99/75 (BP Location: Left Arm)   Pulse (!) 106   Temp 98.6 F (37 C) (Oral)   Resp 16   Ht 5\' 6"  (1.676 m)   Wt 61.6 kg   SpO2 95%   BMI 21.90 kg/m   General Appearance: No distress  Neuro:without focal findings,  speech normal,  HEENT: PERRLA, EOM intact.   Pulmonary: normal breath sounds, No wheezing.  CardiovascularNormal S1,S2.  No m/r/g.   Abdomen: Benign, Soft, non-tender. Renal:  No costovertebral tenderness  GU:  No performed at this time. Endoc: No evident thyromegaly, no signs of acromegaly. Skin:   warm, no rashes, no ecchymosis  Extremities: normal, no cyanosis, clubbing.  Other findings:    LABORATORY PANEL:   CBC Recent Labs  Lab 12/03/18 0256  WBC 10.0  HGB 8.7*  HCT 26.6*  PLT 97*   ------------------------------------------------------------------------------------------------------------------  Chemistries  Recent Labs  Lab 12/09/2018 2112 12/02/18 0110  NA 130* 131*  K 3.8 3.7  CL 94* 98  CO2 26 25  GLUCOSE 88 81  BUN 12 12  CREATININE 0.57* 0.53*  CALCIUM 8.8* 8.0*  AST 15  --   ALT 17  --   ALKPHOS 146*  --   BILITOT 0.9  --    ------------------------------------------------------------------------------------------------------------------  Cardiac Enzymes No results for input(s): TROPONINI in the last 168 hours. ------------------------------------------------------------  RADIOLOGY:  Ct Chest  W Contrast  Result Date: 12/03/2018 CLINICAL DATA:  Stage IV adenocarcinoma of the lung. EXAM: CT CHEST WITH CONTRAST TECHNIQUE: Multidetector CT imaging of the chest was performed during intravenous contrast administration. CONTRAST:  51mL OMNIPAQUE IOHEXOL 300 MG/ML  SOLN COMPARISON:  10/09/2018 FINDINGS: Cardiovascular: The heart size is normal. No substantial pericardial effusion. Persistent mass-effect on the inferior left atrium. Mediastinum/Nodes: Interval progression of right apical tumor with incorporation of the 12 mm right paratracheal lymph node measured on the prior study. Mediastinal component posterior to the heart measures 7.6 x 10.9 cm today compared to 6.6 x 8.8 cm when measured  at the same level and in the same axes on the prior study. Lungs/Pleura: Marked involvement of the right thoracic wall/pleural space again noted. Anterior component measured previously at 6.3 x 4.3 cm is now 8.3 x 4.9 cm. Mass-effect on the mediastinum evident with leftward displacement. Similar appearance of mass-effect on right middle and lower lobe airways. Very little residual aerated right lung evident on today's study compatible with tumor progression. New areas of left lung involvement identified on today's exam including irregular 1.8 x 1.4 cm left lower lobe nodule and 1.9 x 1.5 cm anterior left upper lobe nodule. Numerous other tiny left lung nodules evident with new pleural nodularity diffusely in the left hemithorax. Small left pleural effusion is new in the interval. Upper Abdomen: Although incompletely visualize, there is probable necrotic lymphadenopathy in the upper abdomen which appears new since 10/24/2018. Musculoskeletal: No worrisome lytic or sclerotic osseous abnormality. IMPRESSION: 1. Interval progression of the disease in the right hemithorax with almost no residual aerated right lung on today's exam. 2. Interval development of diffuse pleural nodularity in the left hemithorax with new areas of  irregular nodularity in the parenchyma of the left lung. 3. New small left pleural effusion. 4. Although incompletely visualized, there appears to be necrotic lymphadenopathy in the upper abdomen, new since 10/24/2018. Electronically Signed   By: Misty Stanley M.D.   On: 12/03/2018 09:13   US Venous Img Upper Uni Right  Result Date: 12/03/2018 CLINICAL DATA:  Right upper extremity edema. History of lung cancer. History of pulmonary embolism. Evaluate for DVT. EXAM: RIGHT UPPER EXTREMITY VENOUS DOPPLER ULTRASOUND TECHNIQUE: Gray-scale sonography with graded compression, as well as color Doppler and duplex ultrasound were performed to evaluate the upper extremity deep venous system from the level of the subclavian vein and including the jugular, axillary, basilic, radial, ulnar and upper cephalic vein. Spectral Doppler was utilized to evaluate flow at rest and with distal augmentation maneuvers. COMPARISON:  None. FINDINGS: Contralateral Subclavian Vein: Respiratory phasicity is normal and symmetric with the symptomatic side. No evidence of thrombus. Normal compressibility. Internal Jugular Vein: No evidence of thrombus. Normal compressibility, respiratory phasicity and response to augmentation. Subclavian Vein: No evidence of thrombus. Normal compressibility, respiratory phasicity and response to augmentation. Axillary Vein: No evidence of thrombus. Normal compressibility, respiratory phasicity and response to augmentation. Cephalic Vein: No evidence of thrombus. Normal compressibility, respiratory phasicity and response to augmentation. Basilic Vein: No evidence of thrombus. Normal compressibility, respiratory phasicity and response to augmentation. Brachial Veins: There is short-segment age-indeterminate nonocclusive wall thickening involving 1 of the paired brachial veins (image 22 and 23). The adjacent brachial vein appears widely patent. Radial Veins: No evidence of thrombus. Normal compressibility,  respiratory phasicity and response to augmentation. Ulnar Veins: No evidence of thrombus. Normal compressibility, respiratory phasicity and response to augmentation. Venous Reflux:  None visualized. Other Findings:  None visualized. IMPRESSION: 1. Examination is positive for Short-segment age-indeterminate nonocclusive wall thickening/DVT involving one of the paired right brachial veins. The adjacent paired brachial vein appears widely patent. 2. Otherwise, normal right upper extremity venous Doppler ultrasound. Electronically Signed   By: Sandi Mariscal M.D.   On: 12/03/2018 09:59   Dg Chest Portable 1 View  Result Date: 11/30/2018 CLINICAL DATA:  Severe dyspnea.  History of cancer. EXAM: PORTABLE CHEST 1 VIEW COMPARISON:  12/23/2018 at 1721 hours FINDINGS: Near complete opacification of the right hemithorax sparing the right hilar region. Slight interval worsening of pulmonary consolidation noted at the right base since prior. Mild interstitial  edema the left lung persists. Obscuration of the right heart border is noted. Port catheter tip is unchanged projecting to the cavoatrial level. No aggressive osseous abnormality. IMPRESSION: Interval slight worsening of right base pulmonary opacity since most recent exam. Electronically Signed   By: Ashley Royalty M.D.   On: 12/13/2018 23:37   Dg Chest Port 1 View  Result Date: 12/06/2018 CLINICAL DATA:  Fever and shortness of breath today. On chemotherapy for lung cancer. EXAM: PORTABLE CHEST 1 VIEW COMPARISON:  Chest radiograph October 15, 2018 FINDINGS: Worsening opacification RIGHT hemithorax with air bronchograms. Interstitial prominence LEFT lung increased from prior examination. No LEFT pleural effusion or focal consolidation. Cardiac silhouette is predominantly obscured. No pneumothorax. Single-lumen RIGHT chest Port-A-Cath via RIGHT internal jugular venous approach with distal tip projecting distal superior vena cava. No pneumothorax. Old LEFT rib fracture.  IMPRESSION: 1. Worsening opacity of RIGHT lung concerning disease progression though given increased interstitial prominence on the LEFT, atypical infection or pulmonary edema possible. Electronically Signed   By: Elon Alas M.D.   On: 12/23/2018 20:53       Thank  you for the consultation and for allowing [facility] to assist in the care of your patient. Our recommendations are noted above.  Please contact us if we can be of further service.   Marda Stalker, M.D., F.C.C.P.  Board Certified in Internal Medicine, Pulmonary Medicine, [place], and Sleep Medicine.   Pulmonary and Critical Care Office Number: [phone]   12/03/2018

## 2018-12-03 NOTE — Progress Notes (Signed)
[place] OFFICE PROGRESS NOTE  Patient Care Team: Dion Body, MD as PCP - General (Family Medicine) [name], MD as PCP - Cardiology (Cardiology) Telford Nab, RN as Registered Nurse  Cancer Staging Primary cancer of right lower lobe of lung [facility]) Staging form: Lung, AJCC 8th Edition - Clinical stage from 03/02/2018: Stage IV (cT4, cN0, pM1a) - Unsigned    Oncology History   # June 2019- LUNG CA- HIGH GRADE CA with sarcomatoid features [EGFR MUTATED; exon 19; clinically non-small cell];Dr.Jeffrey Clark; II opinion at [facility];  # June 17th- carbo-taxol #1; PROGRESSION June 26th 2019- Osimertinib '80mg'$ /day; S/p palliative RT [facility]; June 28th, 2019]; reirradiation right chest wall finished radiation December 10th [Dr.Kinard]  # Oct 2019- Severe progression in chest; START carbo-Taxol-Tecentriq-Avastin x4 cycles; 10/09/2018- PR/?  Increasing infiltrate right upper lobe [?mixed response]; 1/16-Tecen-Avastin; CT- Jan 27th- Progressive right flank mass; last Tecen- Avastin-   # FEB 27th 2020- Tax-Cyramza- 02/06.   # Repeat chest Bx- Nov 2019- high grade Non-small cell with sarcomatoid features  # Dec 3rd 2019- Left Lung PE- lovenox  # HTN/ cryptogenic stroke;  # MOLECULAR TESTING- EGFR exon 19 deletion [omniseq]; Oct 2019- Plasma/guadant tetsing- EGFR exon 19del*   DIAGNOSIS: non-small cell lung ca  STAGE:  IV  ;GOALS: palliative  CURRENT/MOST RECENT THERAPY- TAX-RAM      Primary cancer of right lower lobe of lung ([place])   11/22/2018 -  Chemotherapy    The patient had pegfilgrastim-cbqv (UDENYCA) injection 6 mg, 6 mg, Subcutaneous, Once, 1 of 6 cycles Administration: 6 mg (11/23/2018) DOCEtaxel (TAXOTERE) 130 mg in sodium chloride 0.9 % 250 mL chemo infusion, 75 mg/m2 = 130 mg, Intravenous,  Once, 1 of 6 cycles Administration: 130 mg (11/22/2018) ramucirumab (CYRAMZA) 600 mg in sodium chloride 0.9 % 190 mL chemo infusion, 10 mg/kg = 600 mg,  Intravenous, Once, 1 of 6 cycles Administration: 600 mg (11/22/2018)  for chemotherapy treatment.     Cc: Patient resting in the bed.  Continues to complain of mild shortness of breath especially exertion.  Currently on 2 L of oxygen.Currently on antibiotics.  Noted to have swelling of the right upper extremity compared to the left.  Mild swelling in the legs.  No fevers overnight.     PAST MEDICAL HISTORY :  Past Medical History:  Diagnosis Date  . Anorexia   . Cryptogenic stroke ([place]) 08/2016   a. 09/15/2016 in setting of PFO.  . CVA (cerebral vascular accident) ([place]) 09/07/2016  . ED (erectile dysfunction)   . Essential (primary) hypertension 02/01/2016  . History of stroke 10/08/2016  . Hx of insomnia   . Hypertension   . OSA on CPAP   . PFO (patent foramen ovale)    a. s/p closure 09/2016 by Dr. [name].  . Pleural effusion on right 02/21/2018  . Primary cancer of right lower lobe of lung ([employer]) 02/25/2018   Chemo tx's and rad tx's  . PVC's (premature ventricular contractions) 10/08/2016  . 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 TIA (transient ischemic attack) 09/07/2016    PAST SURGICAL HISTORY :   Past Surgical History:  Procedure Laterality Date  . APPENDECTOMY  1980  . EYE SURGERY    . PATENT FORAMEN OVALE CLOSURE  10/07/2016  . PATENT FORAMEN OVALE CLOSURE N/A 10/07/2016   Procedure: Patent [place](PFO) Closure;  Surgeon: [name], MD;  Location: [place] CV LAB;  Service: Cardiovascular;  Laterality: N/A;  . PORTACATH PLACEMENT N/A  02/23/2018   Procedure: INSERTION PORT-A-CATH;  Surgeon: Nestor Lewandowsky, MD;  Location: [facility] ORS;  Service: Thoracic;  Laterality: N/A;  . STRABISMUS SURGERY Left 1984  . TEE WITHOUT CARDIOVERSION N/A 09/08/2016   Procedure: TRANSESOPHAGEAL ECHOCARDIOGRAM (TEE);  Surgeon: [name], MD;  Location: [facility] ORS;  Service: Cardiovascular;  Laterality: N/A;  . VIDEO ASSISTED THORACOSCOPY (VATS) W/TALC PLEUADESIS  Right 02/23/2018   Procedure: VIDEO ASSISTED THORACOSCOPY (VATS) W/TALC PLEUADESIS;  Surgeon: Nestor Lewandowsky, MD;  Location: [facility] ORS;  Service: Thoracic;  Laterality: Right;  . VITRECTOMY Right 2013    FAMILY HISTORY :   Family History  Problem Relation Age of Onset  . Other Paternal Uncle        Abdominal tumor     SOCIAL HISTORY:   Social History   Tobacco Use  . Smoking status: Former Smoker    Packs/day: 0.50    Years: 10.00    Pack years: 5.00    Last attempt to quit: 2011    Years since quitting: 9.1  . Smokeless tobacco: Former Systems developer    Types: Snuff  Substance Use Topics  . Alcohol use: Yes    Alcohol/week: 3.0 standard drinks    Types: 3 Cans of beer per week  . Drug use: No    ALLERGIES:  has No Known Allergies.  MEDICATIONS:  Current Facility-Administered Medications  Medication Dose Route Frequency Provider Last Rate Last Dose  . 0.9 %  sodium chloride infusion   Intravenous PRN [name], MD   Stopped at 12/03/18 [phone]  . acetaminophen (TYLENOL) tablet 650 mg  650 mg Oral Q6H PRN [name], MD       Or  . acetaminophen (TYLENOL) suppository 650 mg  650 mg Rectal Q6H PRN [name], MD      . benzonatate (TESSALON) capsule 200 mg  200 mg Oral TID PRN [name], MD      . ceFEPIme (MAXIPIME) 2 g in sodium chloride 0.9 % 100 mL IVPB  2 g Intravenous Driscilla Moats, MD 200 mL/hr at 12/03/18 0631    . DULoxetine (CYMBALTA) DR capsule 30 mg  30 mg Oral Daily [name], MD   30 mg at 12/02/18 0919  . furosemide (LASIX) tablet 20 mg  20 mg Oral Daily PRN [name], MD   20 mg at 12/02/18 0919  . guaiFENesin-dextromethorphan (ROBITUSSIN DM) 100-10 MG/5ML syrup 5 mL  5 mL Oral Q4H PRN [name], MD      . HYDROmorphone (DILAUDID) tablet 2-4 mg  2-4 mg Oral Q4H PRN [name], MD   4 mg at 12/03/18 0820  . ibuprofen (ADVIL,MOTRIN) tablet 800 mg  800 mg Oral Daily PRN [name], MD      . ipratropium-albuterol (DUONEB) 0.5-2.5 (3)  MG/3ML nebulizer solution 3 mL  3 mL Nebulization Q4H PRN [name], MD      . LORazepam (ATIVAN) tablet 0.5 mg  0.5 mg Oral Q8H PRN [name], MD   0.5 mg at 12/02/18 0131  . metoprolol tartrate (LOPRESSOR) tablet 12.5 mg  12.5 mg Oral BID [name], MD   12.5 mg at 12/02/18 0931  . morphine (MS CONTIN) 12 hr tablet 30 mg  30 mg Oral Q12H [name], MD   30 mg at 12/03/18 2841  . ondansetron (ZOFRAN) tablet 4 mg  4 mg Oral Q6H PRN [name], MD       Or  . ondansetron [facility]) injection 4 mg  4 mg Intravenous Q6H  PRN [name], MD      . phenol [facility]) mouth spray 1 spray  1 spray Mouth/Throat PRN Henreitta Leber, MD   1 spray at 12/02/18 1921  . rivaroxaban (XARELTO) tablet 20 mg  20 mg Oral Q supper [name], MD   20 mg at 12/02/18 1711  . zolpidem (AMBIEN) tablet 10 mg  10 mg Oral QHS PRN [name], MD        PHYSICAL EXAMINATION: ECOG PERFORMANCE STATUS: 1 - Symptomatic but completely ambulatory  BP 99/75 (BP Location: Left Arm)   Pulse (!) 106   Temp 98.6 F (37 C) (Oral)   Resp 16   Ht '5\' 6"'$  (1.676 m)   Wt 135 lb 11.2 oz (61.6 kg)   SpO2 95%   BMI 21.90 kg/m   Filed Weights   11/25/2018 2059 12/02/18 0056 12/03/18 0442  Weight: 133 lb (60.3 kg) 137 lb (62.1 kg) 135 lb 11.2 oz (61.6 kg)    Physical Exam  Constitutional: He is oriented to person, place, and time and well-developed, well-nourished, and in no distress.  Thin cachectic appearing Caucasian male patient.  He is accompanied by his wife.  He is on 3 L of oxygen.  HENT:  Head: Normocephalic and atraumatic.  Mouth/Throat: Oropharynx is clear and moist. No oropharyngeal exudate.  Eyes: Pupils are equal, round, and reactive to light.  Neck: Normal range of motion. Neck supple.  Cardiovascular: Normal rate and regular rhythm.  Pulmonary/Chest: No respiratory distress. He has no wheezes.  Decreased air entry bilaterally right more than left.  Abdominal: Soft. Bowel sounds are  normal. He exhibits no distension and no mass. There is no abdominal tenderness. There is no rebound and no guarding.  Musculoskeletal: Normal range of motion.        General: Edema present. No tenderness.     Comments: Right arm is swollen.  Neurological: He is alert and oriented to person, place, and time.  Skin: Skin is warm.  Psychiatric: Affect normal.        LABORATORY DATA:  I have reviewed the data as listed    Component Value Date/Time   NA 131 (L) 12/02/2018 0110   NA 141 10/05/2016 0848   K 3.7 12/02/2018 0110   K 4.3 05/15/2013 0612   CL 98 12/02/2018 0110   CO2 25 12/02/2018 0110   GLUCOSE 81 12/02/2018 0110   BUN 12 12/02/2018 0110   BUN 17 10/05/2016 0848   CREATININE 0.53 (L) 12/02/2018 0110   CALCIUM 8.0 (L) 12/02/2018 0110   PROT 6.2 (L) 12/24/2018 2112   ALBUMIN 2.6 (L) 12/09/2018 2112   AST 15 12/10/2018 2112   ALT 17 12/13/2018 2112   ALKPHOS 146 (H) 12/06/2018 2112   BILITOT 0.9 12/07/2018 2112   GFRNONAA >60 12/02/2018 0110   GFRAA >60 12/02/2018 0110    No results found for: SPEP, UPEP  Lab Results  Component Value Date   WBC 10.0 12/03/2018   NEUTROABS 9.6 (H) 11/29/2018   HGB 8.7 (L) 12/03/2018   HCT 26.6 (L) 12/03/2018   MCV 97.4 12/03/2018   PLT 97 (L) 12/03/2018      Chemistry      Component Value Date/Time   NA 131 (L) 12/02/2018 0110   NA 141 10/05/2016 0848   K 3.7 12/02/2018 0110   K 4.3 05/15/2013 0612   CL 98 12/02/2018 0110   CO2 25 12/02/2018 0110   BUN 12 12/02/2018 0110   BUN 17 10/05/2016  0848   CREATININE 0.53 (L) 12/02/2018 0110      Component Value Date/Time   CALCIUM 8.0 (L) 12/02/2018 0110   ALKPHOS 146 (H) 12/18/2018 2112   AST 15 12/03/2018 2112   ALT 17 12/19/2018 2112   BILITOT 0.9 12/19/2018 2112       RADIOGRAPHIC STUDIES: I have personally reviewed the radiological images as listed and agreed with the findings in the report. Dg Chest Portable 1 View  Result Date: 12/10/2018 CLINICAL DATA:   Severe dyspnea.  History of cancer. EXAM: PORTABLE CHEST 1 VIEW COMPARISON:  11/30/2018 at 1721 hours FINDINGS: Near complete opacification of the right hemithorax sparing the right hilar region. Slight interval worsening of pulmonary consolidation noted at the right base since prior. Mild interstitial edema the left lung persists. Obscuration of the right heart border is noted. Port catheter tip is unchanged projecting to the cavoatrial level. No aggressive osseous abnormality. IMPRESSION: Interval slight worsening of right base pulmonary opacity since most recent exam. Electronically Signed   By: Ashley Royalty M.D.   On: 12/04/2018 23:37   Dg Chest Port 1 View  Result Date: 11/25/2018 CLINICAL DATA:  Fever and shortness of breath today. On chemotherapy for lung cancer. EXAM: PORTABLE CHEST 1 VIEW COMPARISON:  Chest radiograph October 15, 2018 FINDINGS: Worsening opacification RIGHT hemithorax with air bronchograms. Interstitial prominence LEFT lung increased from prior examination. No LEFT pleural effusion or focal consolidation. Cardiac silhouette is predominantly obscured. No pneumothorax. Single-lumen RIGHT chest Port-A-Cath via RIGHT internal jugular venous approach with distal tip projecting distal superior vena cava. No pneumothorax. Old LEFT rib fracture. IMPRESSION: 1. Worsening opacity of RIGHT lung concerning disease progression though given increased interstitial prominence on the LEFT, atypical infection or pulmonary edema possible. Electronically Signed   By: Elon Alas M.D.   On: 12/13/2018 20:53     ASSESSMENT & PLAN:  Primary cancer of right lower lobe of lung [facility]) #48 year old male patient with adenocarcinoma of the lung stage IV currently on Taxotere-Cyramza is admitted to hospital for progressive shortness of breath/fever  # Non-small cell lung cancer/stage IV-currently status post Taxotere Cyramza approximately 1 week ago.  Patient has aggressive/highly proliferative  malignancy-this has not responded very well to his prior lines of therapies.  Concerning for progression/versus infectious causes [see below].  We will plan to get a CT scan of the chest.  #Worsening shortness of breath fever-question pneumonia based on chest x-ray versus progressive malignancy.  Await CT chest as ordered above.  Agree with IV antibiotics,  #Right upper extremity swelling-question infiltration versus DVT-recommend ultrasound of the upper extremity.  Patient on Eliquis.  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  Currently on MS Contin 30 BID; dilaudid '2mg'$  every 4 hours prn. [2-4/day].  Stable.  #Left lingular/segmental PE-on Xarelto-stable see above.   # Malnutrition Albumin-2.8-stable  #We will follow the patient closely..     Orders Placed This Encounter  Procedures  . Blood Culture (routine x 2)    Standing Status:   Standing    Number of Occurrences:   2  . MRSA PCR Screening    Standing Status:   Standing    Number of Occurrences:   1  . Respiratory Panel by PCR    Standing Status:   Standing    Number of Occurrences:   1  . DG Chest Port 1 View    Standing Status:   Standing    Number of Occurrences:  1    Order Specific Question:   Reason for Exam (SYMPTOM  OR DIAGNOSIS REQUIRED)    Answer:   fever, productive cough;  Marland Kitchen DG Chest Portable 1 View    Standing Status:   Standing    Number of Occurrences:   1    Order Specific Question:   Reason for Exam (SYMPTOM  OR DIAGNOSIS REQUIRED)    Answer:   shortness of breath  . US Venous Img Upper Uni Right    Standing Status:   Standing    Number of Occurrences:   1    Order Specific Question:   Symptom/Reason for Exam    Answer:   Swelling of right upper extremity [[id number]]  . CT CHEST W CONTRAST    Standing Status:   Standing    Number of Occurrences:   1    Order Specific Question:   ** REASON FOR EXAM (FREE TEXT)    Answer:   lung  cancer; worsening respiratory status    Order Specific Question:   Does the patient have a contrast media/X-ray dye allergy?    Answer:   No    Order Specific Question:   If indicated for the ordered procedure, I authorize the administration of contrast media per Radiology protocol    Answer:   Yes    Order Specific Question:   Radiology Contrast Protocol - do NOT remove file path    Answer:   [email or web address]  . Lactic acid, plasma    Standing Status:   Standing    Number of Occurrences:   2  . Comprehensive metabolic panel    Standing Status:   Standing    Number of Occurrences:   1  . CBC WITH DIFFERENTIAL    Standing Status:   Standing    Number of Occurrences:   1  . Urinalysis, Routine w reflex microscopic    Standing Status:   Standing    Number of Occurrences:   1  . Lipase, blood    Standing Status:   Standing    Number of Occurrences:   1  . Influenza panel by PCR (type A & B)    Standing Status:   Standing    Number of Occurrences:   1  . Basic metabolic panel    Standing Status:   Standing    Number of Occurrences:   1  . CBC    Standing Status:   Standing    Number of Occurrences:   1  . Lactic acid, plasma    Standing Status:   Standing    Number of Occurrences:   1  . CBC    Standing Status:   Standing    Number of Occurrences:   1  . Diet Heart Room service appropriate? Yes; Fluid consistency: Thin    Standing Status:   Standing    Number of Occurrences:   1    Order Specific Question:   Room service appropriate?    Answer:   Yes    Order Specific Question:   Fluid consistency:    Answer:   Thin  . Refer to Sidebar Report for: Sepsis Bundle ED/IP    Sepsis Bundle ED/IP    Standing Status:   Standing    Number of Occurrences:   1  . Document vital signs within 1-hour of fluid bolus completion and notify provider of bolus completion    Standing Status:   Standing    Number of Occurrences:  1  . Initiate Carrier Fluid  Protocol    Standing Status:   Standing    Number of Occurrences:   1  . Cardiac monitoring    Standing Status:   Standing    Number of Occurrences:   1  . Vital signs    Standing Status:   Standing    Number of Occurrences:   1  . Notify physician    Standing Status:   Standing    Number of Occurrences:   20    Order Specific Question:   Notify Physician    Answer:   for pulse less than 55 or greater than 120    Order Specific Question:   Notify Physician    Answer:   for respiratory rate less than 12 or greater than 25    Order Specific Question:   Notify Physician    Answer:   for temperature greater than 100.5 F    Order Specific Question:   Notify Physician    Answer:   for urinary output less than 30 mL/hr for four hours    Order Specific Question:   Notify Physician    Answer:   for systolic BP less than 90 or greater than 678, diastolic BP less than 60 or greater than 100  . Initiate Oral Care Protocol    Standing Status:   Standing    Number of Occurrences:   1  . Initiate Carrier Fluid Protocol    Standing Status:   Standing    Number of Occurrences:   1  . RN may order General Admission PRN Orders utilizing "General Admission PRN medications" (through manage orders) for the following patient needs: allergy symptoms (Claritin), cold sores (Carmex), cough (Robitussin DM), eye irritation (Liquifilm Tears), hemorrhoids (Tucks), indigestion (Maalox), minor skin irritation (Hydrocortisone Cream), muscle pain Suezanne Jacquet Gay), nose irritation (saline nasal spray) and sore throat (Chloraseptic spray).    Standing Status:   Standing    Number of Occurrences:   [id number]  . Intake and Output    Standing Status:   Standing    Number of Occurrences:   1  . Limited resuscitation (code)    Standing Status:   Standing    Number of Occurrences:   1    Order Specific Question:   In the event of cardiac or respiratory ARREST: Initiate Code Blue, Call Rapid Response    Answer:   Yes    Order  Specific Question:   In the event of cardiac or respiratory ARREST: Perform CPR    Answer:   No    Order Specific Question:   In the event of cardiac or respiratory ARREST: Perform Intubation/Mechanical Ventilation    Answer:   Yes    Order Specific Question:   In the event of cardiac or respiratory ARREST: Use NIPPV/BiPAp only if indicated    Answer:   Yes    Order Specific Question:   In the event of cardiac or respiratory ARREST: Administer ACLS medications if indicated    Answer:   Yes    Order Specific Question:   In the event of cardiac or respiratory ARREST: Perform Defibrillation or Cardioversion if indicated    Answer:   No    Order Specific Question:   Comments    Answer:   No trach/peg.  . Call Code Sepsis Karen Chafe [phone]) Reason for Consult? tracking    Standing Status:   Standing    Number of Occurrences:   1  Order Specific Question:   Initiate "Code Sepsis" tracking    Answer:   yes    Order Specific Question:   Contact PCCM ([phone])    Answer:   No    Order Specific Question:   Reason for Consult?    Answer:   tracking  . pharmacy consult    Standing Status:   Standing    Number of Occurrences:   1    Order Specific Question:   Reason for Consult (*indicate specifics in comment field)    Answer:   Other (see comment)    Order Specific Question:   Comment:    Answer:   Treatment for SUSPECTED SEPSIS has been initiated. Follow patient to expedite delivery of core sepsis measures, specifically timely, antibiotic administration.  Marland Kitchen ceFEPime (MAXIPIME) per pharmacy consult    Standing Status:   Standing    Number of Occurrences:   1    Order Specific Question:   Antibiotic Indication:    Answer:   Other Indication (list below)    Order Specific Question:   Other Indication:    Answer:   Unknown source  . Consult to oncology Consult Timeframe: ROUTINE - requires response within 24 hours; Reason for Consult? lung cancer, sepsis, HCAP Contacted via  EPIC    Contacted via EPIC    Standing Status:   Standing    Number of Occurrences:   1    Order Specific Question:   Consult Timeframe    Answer:   ROUTINE - requires response within 24 hours    Order Specific Question:   Reason for Consult?    Answer:   lung cancer, sepsis, HCAP  . Pulse oximetry, continuous    Standing Status:   Standing    Number of Occurrences:   1  . Oxygen therapy    Standing Status:   Standing    Number of Occurrences:   20  . Admit to Inpatient (patient's expected length of stay will be greater than 2 midnights or inpatient only procedure)    Standing Status:   Standing    Number of Occurrences:   1    Order Specific Question:   Hospital Area    Answer:   [place] [[id number]]    Order Specific Question:   Level of Care    Answer:   Med-Surg [16]    Order Specific Question:   Diagnosis    Answer:   Sepsis [facility]) [[id number]]    Order Specific Question:   Admitting Physician    Answer:   [name] [[id number]]    Order Specific Question:   Attending Physician    Answer:   Jannifer Franklin, DAVID [phone]    Order Specific Question:   Estimated length of stay    Answer:   past midnight tomorrow    Order Specific Question:   Certification:    Answer:   I certify this patient will need inpatient services for at least 2 midnights    Order Specific Question:   PT Class (Do Not Modify)    Answer:   Inpatient [101]    Order Specific Question:   PT Acc Code (Do Not Modify)    Answer:   Private [1]   All questions were answered. The patient knows to call the clinic with any problems, questions or concerns.      Cammie Sickle, MD 12/03/2018 8:37 AM

## 2018-12-03 NOTE — Progress Notes (Signed)
Initial Nutrition Assessment  DOCUMENTATION CODES:   Severe malnutrition in context of chronic illness  INTERVENTION:   - Recommend liberalizing diet from Heart Healthy to Regular  - Pro-stat 30 ml BID, each supplement provides 100 kcal and 15 grams of protein  - Snacks TID between meals, RD ordered via [place]  - MVI with minerals daily  - High Calorie, High Protein Nutrition Therapy handout and education provided to pt and family members  NUTRITION DIAGNOSIS:   Severe Malnutrition related to chronic illness (stage IV right-sided lung cancer undergoing chemotherapy) as evidenced by moderate fat depletion, severe fat depletion, moderate muscle depletion, severe muscle depletion, percent weight loss (15.2% weight loss in 5 months).  GOAL:   Patient will meet greater than or equal to 90% of their needs  MONITOR:   Supplement acceptance, PO intake, Weight trends, Labs  REASON FOR ASSESSMENT:   Malnutrition Screening Tool    ASSESSMENT:   48 year old male who presented to the ED on 3/7 with SOB, fever. PMH of stage IV right-sided lung cancer undergoing chemotherapy (Taxotere-Cyramza), HTN, previous CVA, previous TIA, OSA on CPAP. Pt admitted with sepsis, right-sided PNA.  Spoke with pt and family members at bedside. Pt reports that his appetite has been poor for the last 2-3 days due to feeling sick. Pt states that prior to this, he had a "pretty good" appetite and would consume 2 "good-sized meals" daily with snacks in between. A meal may include lasagna and salad. Snacks include pretzels and Chex mix. Pt reports drinking mostly water and states he does not like oral nutrition supplements. Pt shares that he has been trying to eat higher calorie foods like occasionally eating [employer] milkshakes. Pt endorses early satiety.  In terms of other chemotherapy related side effects, pt reports that his only issues have been constipation and food not having any taste. Pt  denies N/V/D. Pt endorses a sore throat but states that this is related to constant coughing vs chemotherapy.  Pt endorses weight loss that began in May 2019. At that time, pt reports weighing 165 lbs and that now he is down to 130-135 lbs. Pt believes he has finally stabilized within that range and is no longer losing weight. Per weight history in chart, pt has experienced an 11 kg weight loss since 07/02/18. This is a 15.2% weight loss in 5 months which is significant for timeframe.  RD provided "High-Calorie High-Protein Nutrition Therapy" handout from the Academy of [employer]. Provided examples on ways to increase caloric density of foods and beverages frequently consumed by the patient. Also provided ideas to promote variety and to incorporate additional nutrient dense foods into patient's diet. Discussed eating small frequent meals and snacks to assist in increasing overall PO intake. Teach back method used.  Pt would not like to drink Ensure Enlive oral nutrition supplements but is willing to take Pro-stat protein supplements. RD to order. Pt also amenable to snacks TID between meals.  Medications reviewed and include: IV antibiotics  Labs reviewed: hemoglobin 8.7 (L), sodium 131 (L), creatinine 0.53 (L)  UOP: 776 ml x 24 hours I/O's: +1.3 L since admit  NUTRITION - FOCUSED PHYSICAL EXAM:    Most Recent Value  Orbital Region  Moderate depletion  Upper Arm Region  Severe depletion  Thoracic and Lumbar Region  Severe depletion  Buccal Region  Moderate depletion  Temple Region  Moderate depletion  Clavicle Bone Region  Severe depletion  Clavicle and Acromion Bone Region  Severe depletion  Scapular Bone Region  Moderate depletion  Dorsal Hand  Moderate depletion  Patellar Region  Moderate depletion  Anterior Thigh Region  Moderate depletion  Posterior Calf Region  Moderate depletion  Edema (RD Assessment)  Moderate [RUE]  Hair  Reviewed  Eyes  Reviewed  Mouth   Reviewed  Skin  Reviewed  Nails  Reviewed       Diet Order:   Diet Order            Diet regular Room service appropriate? Yes; Fluid consistency: Thin  Diet effective now              EDUCATION NEEDS:   Education needs have been addressed  Skin:  Skin Assessment: Reviewed RN Assessment  Last BM:  12/24/2018  Height:   Ht Readings from Last 1 Encounters:  12/02/18 5\' 6"  (1.676 m)    Weight:   Wt Readings from Last 1 Encounters:  12/03/18 61.6 kg    Ideal Body Weight:  64.5 kg  BMI:  Body mass index is 21.9 kg/m.  Estimated Nutritional Needs:   Kcal:  1900-2100  Protein:  95-110 grams  Fluid:  >/= 1.8 L    Gaynell Face, MS, RD, LDN Inpatient Clinical Dietitian Pager: [phone] Weekend/After Hours: [phone]

## 2018-12-04 DIAGNOSIS — E43 Unspecified severe protein-calorie malnutrition: Secondary | ICD-10-CM

## 2018-12-04 MED ORDER — NYSTATIN 100000 UNIT/ML MT SUSP
5.0000 mL | Freq: Four times a day (QID) | OROMUCOSAL | Status: DC
  Administered 2018-12-04 – 2018-12-06 (×8): 500000 [IU] via OROMUCOSAL
  Filled 2018-12-04 (×8): qty 5

## 2018-12-04 NOTE — Progress Notes (Signed)
Pharmacy Antibiotic Note  Ruben Eaton is a 48 y.o. male admitted on 12/12/2018 with sepsis.  Pharmacy has been consulted for Vancomycin and Cefepime dosing. Vancomycin discontinued 3/9.   Plan: Continue  Cefepime 2g IV q8h   Height: 5\' 6"  (167.6 cm) Weight: 135 lb (61.2 kg) IBW/kg (Calculated) : 63.8  Temp (24hrs), Avg:98.7 F (37.1 C), Min:98.3 F (36.8 C), Max:99.3 F (37.4 C)  Recent Labs  Lab 12/25/2018 2112 12/02/18 0110 12/02/18 0541 12/03/18 0256  WBC 10.8* 13.0*  --  10.0  CREATININE 0.57* 0.53*  --   --   LATICACIDVEN 2.7* 2.2* 2.0*  --     Estimated Creatinine Clearance: 97.8 mL/min (A) (by C-G formula based on SCr of 0.53 mg/dL (L)).    No Known Allergies  Antimicrobials this admission: Vancomycin 3/7 >>  3/9  Cefepime 3/7 >>  Metronidazole 3/7 >>   Microbiology results: 3/7 BCx: pending 3/8 MRSA PCR: negative   Thank you for allowing pharmacy to be a part of this patient's care.  Pernell Dupre, PharmD, BCPS Clinical Pharmacist 12/04/2018 2:25 PM

## 2018-12-04 NOTE — Progress Notes (Signed)
[facility] [place] NAME: Ruben Eaton    MR#:  [id number]  DATE OF BIRTH:  1971/02/05  SUBJECTIVE:   Still feels somewhat weak, but denies any hemoptysis, chest pains, nausea, vomiting.  Ambulated and felt short of breath.  REVIEW OF SYSTEMS:    Review of Systems  Constitutional: Negative for chills and fever.  HENT: Negative for congestion and tinnitus.   Eyes: Negative for blurred vision and double vision.  Respiratory: Positive for shortness of breath. Negative for cough and wheezing.   Cardiovascular: Negative for chest pain, orthopnea and PND.  Gastrointestinal: Negative for abdominal pain, diarrhea, nausea and vomiting.  Genitourinary: Negative for dysuria and hematuria.  Neurological: Negative for dizziness, sensory change and focal weakness.  All other systems reviewed and are negative.   Nutrition: Heart Healthy Tolerating Diet: Yes Tolerating PT: await Eval.    DRUG ALLERGIES:  No Known Allergies  VITALS:  Blood pressure 110/77, pulse 99, temperature 98.4 F (36.9 C), temperature source Oral, resp. rate 16, height _0  (1.676 m), weight 61.2 kg, SpO2 91 %.  PHYSICAL EXAMINATION:   Physical Exam  GENERAL:  48 y.o.-year-old patient lying in bed in no acute distress.  EYES: Pupils equal, round, reactive to light and accommodation. No scleral icterus. Extraocular muscles intact.  HEENT: Head atraumatic, normocephalic. Oropharynx and nasopharynx clear.  NECK:  Supple, no jugular venous distention. No thyroid enlargement, no tenderness.  LUNGS: Poor or no a/e on Right lung fields, no wheezing, rales, rhonchi. No use of accessory muscles of respiration.  CARDIOVASCULAR: S1, S2 normal. No murmurs, rubs, or gallops. Right chest wall port-a-cath in place.   ABDOMEN: Soft, nontender, nondistended. Bowel sounds present. No organomegaly or mass.  EXTREMITIES: No cyanosis, clubbing or edema b/l.    NEUROLOGIC: Cranial  nerves II through XII are intact. No focal Motor or sensory deficits b/l.   PSYCHIATRIC: The patient is alert and oriented x 3.  SKIN: No obvious rash, lesion, or ulcer.    LABORATORY PANEL:   CBC Recent Labs  Lab 12/03/18 0256  WBC 10.0  HGB 8.7*  HCT 26.6*  PLT 97*   ------------------------------------------------------------------------------------------------------------------  Chemistries  Recent Labs  Lab 12/06/2018 2112 12/02/18 0110  NA 130* 131*  K 3.8 3.7  CL 94* 98  CO2 26 25  GLUCOSE 88 81  BUN 12 12  CREATININE 0.57* 0.53*  CALCIUM 8.8* 8.0*  AST 15  --   ALT 17  --   ALKPHOS 146*  --   BILITOT 0.9  --    ------------------------------------------------------------------------------------------------------------------  Cardiac Enzymes No results for input(s): TROPONINI in the last 168 hours. ------------------------------------------------------------------------------------------------------------------  RADIOLOGY:  Ct Chest W Contrast  Result Date: 12/03/2018 CLINICAL DATA:  Stage IV adenocarcinoma of the lung. EXAM: CT CHEST WITH CONTRAST TECHNIQUE: Multidetector CT imaging of the chest was performed during intravenous contrast administration. CONTRAST:  52m OMNIPAQUE IOHEXOL 300 MG/ML  SOLN COMPARISON:  10/09/2018 FINDINGS: Cardiovascular: The heart size is normal. No substantial pericardial effusion. Persistent mass-effect on the inferior left atrium. Mediastinum/Nodes: Interval progression of right apical tumor with incorporation of the 12 mm right paratracheal lymph node measured on the prior study. Mediastinal component posterior to the heart measures 7.6 x 10.9 cm today compared to 6.6 x 8.8 cm when measured at the same level and in the same axes on the prior study. Lungs/Pleura: Marked involvement of the right thoracic wall/pleural space again noted. Anterior component measured previously at 6.3  x 4.3 cm is now 8.3 x 4.9 cm. Mass-effect on the  mediastinum evident with leftward displacement. Similar appearance of mass-effect on right middle and lower lobe airways. Very little residual aerated right lung evident on today's study compatible with tumor progression. New areas of left lung involvement identified on today's exam including irregular 1.8 x 1.4 cm left lower lobe nodule and 1.9 x 1.5 cm anterior left upper lobe nodule. Numerous other tiny left lung nodules evident with new pleural nodularity diffusely in the left hemithorax. Small left pleural effusion is new in the interval. Upper Abdomen: Although incompletely visualize, there is probable necrotic lymphadenopathy in the upper abdomen which appears new since 10/24/2018. Musculoskeletal: No worrisome lytic or sclerotic osseous abnormality. IMPRESSION: 1. Interval progression of the disease in the right hemithorax with almost no residual aerated right lung on today's exam. 2. Interval development of diffuse pleural nodularity in the left hemithorax with new areas of irregular nodularity in the parenchyma of the left lung. 3. New small left pleural effusion. 4. Although incompletely visualized, there appears to be necrotic lymphadenopathy in the upper abdomen, new since 10/24/2018. Electronically Signed   By: Misty Stanley M.D.   On: 12/03/2018 09:13   US Venous Img Upper Uni Right  Result Date: 12/03/2018 CLINICAL DATA:  Right upper extremity edema. History of lung cancer. History of pulmonary embolism. Evaluate for DVT. EXAM: RIGHT UPPER EXTREMITY VENOUS DOPPLER ULTRASOUND TECHNIQUE: Gray-scale sonography with graded compression, as well as color Doppler and duplex ultrasound were performed to evaluate the upper extremity deep venous system from the level of the subclavian vein and including the jugular, axillary, basilic, radial, ulnar and upper cephalic vein. Spectral Doppler was utilized to evaluate flow at rest and with distal augmentation maneuvers. COMPARISON:  None. FINDINGS:  Contralateral Subclavian Vein: Respiratory phasicity is normal and symmetric with the symptomatic side. No evidence of thrombus. Normal compressibility. Internal Jugular Vein: No evidence of thrombus. Normal compressibility, respiratory phasicity and response to augmentation. Subclavian Vein: No evidence of thrombus. Normal compressibility, respiratory phasicity and response to augmentation. Axillary Vein: No evidence of thrombus. Normal compressibility, respiratory phasicity and response to augmentation. Cephalic Vein: No evidence of thrombus. Normal compressibility, respiratory phasicity and response to augmentation. Basilic Vein: No evidence of thrombus. Normal compressibility, respiratory phasicity and response to augmentation. Brachial Veins: There is short-segment age-indeterminate nonocclusive wall thickening involving 1 of the paired brachial veins (image 22 and 23). The adjacent brachial vein appears widely patent. Radial Veins: No evidence of thrombus. Normal compressibility, respiratory phasicity and response to augmentation. Ulnar Veins: No evidence of thrombus. Normal compressibility, respiratory phasicity and response to augmentation. Venous Reflux:  None visualized. Other Findings:  None visualized. IMPRESSION: 1. Examination is positive for Short-segment age-indeterminate nonocclusive wall thickening/DVT involving one of the paired right brachial veins. The adjacent paired brachial vein appears widely patent. 2. Otherwise, normal right upper extremity venous Doppler ultrasound. Electronically Signed   By: Sandi Mariscal M.D.   On: 12/03/2018 09:59     ASSESSMENT AND PLAN:   48 year old male with past medical history of stage IV lung cancer, previous TIA, hypertension, obstructive sleep apnea, history of previous CVA who presented to the hospital due to fever, shortness of breath.  1.  Sepsis-patient met criteria on admission given his fever, tachycardia, leukocytosis and chest x-ray findings  suggestive of pneumonia. -Blood cultures remain negative.  Continue IV cefepime for now and can switch to oral Levaquin upon discharge.  2.  Pneumonia- suspected to be HCAP.  -  Continue cefepime, cultures negative.  Patient is already on oxygen at home. - Likely discharge on oral Levaquin tomorrow.  3.  Lung cancer-patient has stage IV lung cancer currently ongoing treatment with Dr. Rogue Bussing. -Oncology repeated a CT chest yesterday which shows progression of his malignancy.  CT chest showing complete atelectasis of the right lung.  Pulmonary consult obtained and they do not think that bronchoscopy would be beneficial this is likely tumor compressing the right lung rather than mucous plugging. -Appreciate oncology input and plan is for initiating chemo and also radiation as an outpatient in the next few weeks. -Prognosis is quite poor and consider goals of care/palliative care consult.  4.  Anxiety/depression-continue Ativan, Cymbalta.  5. Essential HTN - cont. Metoprolol  6. Hx of previous PE - cont. Xarelto.   7. Superficial Right upper Ext. VTE - cont. Xarelto, keep arm elevated, Warm compress and supportive care.   Possible discharge home tomorrow on oral antibiotics.  All the records are reviewed and case discussed with Care Management/Social Worker. Management plans discussed with the patient, family and they are in agreement.  CODE STATUS: Partial Code  DVT Prophylaxis: Xarelto  TOTAL TIME TAKING CARE OF THIS PATIENT: 30 minutes.   POSSIBLE D/C IN 1-2 DAYS, DEPENDING ON CLINICAL CONDITION.   Henreitta Leber M.D on 12/04/2018 at 3:41 PM  Between 7am to 6pm - Pager - [phone]  After 6pm go to [email or web address] - Proofreader  [facility]  Office  [phone]  CC: Primary care physician; Dion Body, MD

## 2018-12-04 NOTE — Progress Notes (Signed)
Ruben Eaton   DOB:[birth year]   JJ#:[id number]    Subjective: Patient rested overnight okay.  No acute events overnight.  No fevers.  Continues to get short of breath on exertion.    Objective:  Vitals:   12/04/18 1628 12/04/18 1933  BP: 102/70 112/73  Pulse: (!) 105 (!) 104  Resp: 18 18  Temp: 98 F (36.7 C) 98.1 F (36.7 C)  SpO2: 96% 96%     Intake/Output Summary (Last 24 hours) at 12/04/2018 1937 Last data filed at 12/04/2018 1843 Gross per 24 hour  Intake 484.85 ml  Output 125 ml  Net 359.85 ml    Physical Exam  Constitutional: He is oriented to person, place, and time.  Thin cachectic appearing Caucasian male patient.  Resting in the bed on 2 L of oxygen.  Accompanied by his wife.  HENT:  Head: Normocephalic and atraumatic.  Mouth/Throat: Oropharynx is clear and moist. No oropharyngeal exudate.  Eyes: Pupils are equal, round, and reactive to light.  Neck: Normal range of motion. Neck supple.  Cardiovascular: Normal rate and regular rhythm.  Pulmonary/Chest: No respiratory distress. He has no wheezes.  Decreased breath sounds on the left side absent breath sounds in the right.  Abdominal: Soft. Bowel sounds are normal. He exhibits no distension and no mass. There is no abdominal tenderness. There is no rebound and no guarding.  Musculoskeletal: Normal range of motion.        General: No tenderness or edema.     Comments: Right upper extremity swelling noted.  Neurological: He is alert and oriented to person, place, and time.  Skin: Skin is warm.  Psychiatric: Affect normal.     Labs:  Lab Results  Component Value Date   WBC 10.0 12/03/2018   HGB 8.7 (L) 12/03/2018   HCT 26.6 (L) 12/03/2018   MCV 97.4 12/03/2018   PLT 97 (L) 12/03/2018   NEUTROABS 9.6 (H) 12/24/2018    Lab Results  Component Value Date   NA 131 (L) 12/02/2018   K 3.7 12/02/2018   CL 98 12/02/2018   CO2 25 12/02/2018    Studies:  Ct Chest W Contrast  Result Date: 12/03/2018 CLINICAL  DATA:  Stage IV adenocarcinoma of the lung. EXAM: CT CHEST WITH CONTRAST TECHNIQUE: Multidetector CT imaging of the chest was performed during intravenous contrast administration. CONTRAST:  36mL OMNIPAQUE IOHEXOL 300 MG/ML  SOLN COMPARISON:  10/09/2018 FINDINGS: Cardiovascular: The heart size is normal. No substantial pericardial effusion. Persistent mass-effect on the inferior left atrium. Mediastinum/Nodes: Interval progression of right apical tumor with incorporation of the 12 mm right paratracheal lymph node measured on the prior study. Mediastinal component posterior to the heart measures 7.6 x 10.9 cm today compared to 6.6 x 8.8 cm when measured at the same level and in the same axes on the prior study. Lungs/Pleura: Marked involvement of the right thoracic wall/pleural space again noted. Anterior component measured previously at 6.3 x 4.3 cm is now 8.3 x 4.9 cm. Mass-effect on the mediastinum evident with leftward displacement. Similar appearance of mass-effect on right middle and lower lobe airways. Very little residual aerated right lung evident on today's study compatible with tumor progression. New areas of left lung involvement identified on today's exam including irregular 1.8 x 1.4 cm left lower lobe nodule and 1.9 x 1.5 cm anterior left upper lobe nodule. Numerous other tiny left lung nodules evident with new pleural nodularity diffusely in the left hemithorax. Small left pleural effusion is new in the  interval. Upper Abdomen: Although incompletely visualize, there is probable necrotic lymphadenopathy in the upper abdomen which appears new since 10/24/2018. Musculoskeletal: No worrisome lytic or sclerotic osseous abnormality. IMPRESSION: 1. Interval progression of the disease in the right hemithorax with almost no residual aerated right lung on today's exam. 2. Interval development of diffuse pleural nodularity in the left hemithorax with new areas of irregular nodularity in the parenchyma of the  left lung. 3. New small left pleural effusion. 4. Although incompletely visualized, there appears to be necrotic lymphadenopathy in the upper abdomen, new since 10/24/2018. Electronically Signed   By: Misty Stanley M.D.   On: 12/03/2018 09:13   US Venous Img Upper Uni Right  Result Date: 12/03/2018 CLINICAL DATA:  Right upper extremity edema. History of lung cancer. History of pulmonary embolism. Evaluate for DVT. EXAM: RIGHT UPPER EXTREMITY VENOUS DOPPLER ULTRASOUND TECHNIQUE: Gray-scale sonography with graded compression, as well as color Doppler and duplex ultrasound were performed to evaluate the upper extremity deep venous system from the level of the subclavian vein and including the jugular, axillary, basilic, radial, ulnar and upper cephalic vein. Spectral Doppler was utilized to evaluate flow at rest and with distal augmentation maneuvers. COMPARISON:  None. FINDINGS: Contralateral Subclavian Vein: Respiratory phasicity is normal and symmetric with the symptomatic side. No evidence of thrombus. Normal compressibility. Internal Jugular Vein: No evidence of thrombus. Normal compressibility, respiratory phasicity and response to augmentation. Subclavian Vein: No evidence of thrombus. Normal compressibility, respiratory phasicity and response to augmentation. Axillary Vein: No evidence of thrombus. Normal compressibility, respiratory phasicity and response to augmentation. Cephalic Vein: No evidence of thrombus. Normal compressibility, respiratory phasicity and response to augmentation. Basilic Vein: No evidence of thrombus. Normal compressibility, respiratory phasicity and response to augmentation. Brachial Veins: There is short-segment age-indeterminate nonocclusive wall thickening involving 1 of the paired brachial veins (image 22 and 23). The adjacent brachial vein appears widely patent. Radial Veins: No evidence of thrombus. Normal compressibility, respiratory phasicity and response to augmentation.  Ulnar Veins: No evidence of thrombus. Normal compressibility, respiratory phasicity and response to augmentation. Venous Reflux:  None visualized. Other Findings:  None visualized. IMPRESSION: 1. Examination is positive for Short-segment age-indeterminate nonocclusive wall thickening/DVT involving one of the paired right brachial veins. The adjacent paired brachial vein appears widely patent. 2. Otherwise, normal right upper extremity venous Doppler ultrasound. Electronically Signed   By: Sandi Mariscal M.D.   On: 12/03/2018 09:59    Primary cancer of right lower lobe of lung [facility]) #48 year old male patient with adenocarcinoma of the lung stage IV currently on Taxotere-Cyramza is admitted to hospital for progressive shortness of breath/fever  # Non-small cell lung cancer/stage IV-currently status post Taxotere Cyramza approximately 10 days ago-approximately 10 days ago.  CT scan March 9-shows progressive right lung disease and also new nodules on the left upper lobe/small pleural effusion.  Discussed the CT scan appears quite ominous.  Discussed with Dr. Carlis Abbott at [facility] further testing on clinical trial options.  Discussed with Dr. Sondra Come radiation oncology [facility] feels 1 more fraction palliative radiation to the lung is feasible.  For now we will plan to continue -Cyramza Taxotere as planned in the next 2 weeks.  #Worsening shortness of breath fever-question pneumonia based on chest x-ray versus progressive malignancy.  Continue on IV antibiotics with possible discharge home on oral Levaquin.  #Right upper extremity brachial vein DVT-continue Eliquis; recommend elevation/cold packs.  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  Currently on MS  Contin 30 BID; dilaudid 2mg  every 4 hours prn. [2-4/day].  Stable  #Left lingular/segmental PE-on Xarelto-stable see above.   # I reviewed the blood work- with the patient in  detail; also reviewed the imaging independently [as summarized above]; and with the patient in detail.  Discussed with Dr. Verdell Carmine.      Cammie Sickle, MD 12/04/2018  7:37 PM

## 2018-12-05 ENCOUNTER — Inpatient Hospital Stay: Payer: 59

## 2018-12-05 ENCOUNTER — Other Ambulatory Visit: Payer: Self-pay

## 2018-12-05 ENCOUNTER — Encounter (HOSPITAL_COMMUNITY): Payer: Self-pay

## 2018-12-05 DIAGNOSIS — Z515 Encounter for palliative care: Secondary | ICD-10-CM

## 2018-12-05 LAB — LACTIC ACID, PLASMA
Lactic Acid, Venous: 2.2 mmol/L (ref 0.5–1.9)
Lactic Acid, Venous: 2.2 mmol/L (ref 0.5–1.9)

## 2018-12-05 LAB — CBC WITH DIFFERENTIAL/PLATELET
ABS IMMATURE GRANULOCYTES: 0.12 10*3/uL — AB (ref 0.00–0.07)
Basophils Absolute: 0 10*3/uL (ref 0.0–0.1)
Basophils Relative: 0 %
EOS ABS: 0 10*3/uL (ref 0.0–0.5)
Eosinophils Relative: 0 %
HCT: 26.6 % — ABNORMAL LOW (ref 39.0–52.0)
Hemoglobin: 8.5 g/dL — ABNORMAL LOW (ref 13.0–17.0)
IMMATURE GRANULOCYTES: 1 %
Lymphocytes Relative: 4 %
Lymphs Abs: 0.4 10*3/uL — ABNORMAL LOW (ref 0.7–4.0)
MCH: 31.4 pg (ref 26.0–34.0)
MCHC: 32 g/dL (ref 30.0–36.0)
MCV: 98.2 fL (ref 80.0–100.0)
Monocytes Absolute: 0.6 10*3/uL (ref 0.1–1.0)
Monocytes Relative: 6 %
NEUTROS PCT: 89 %
Neutro Abs: 8.7 10*3/uL — ABNORMAL HIGH (ref 1.7–7.7)
PLATELETS: 71 10*3/uL — AB (ref 150–400)
RBC: 2.71 MIL/uL — ABNORMAL LOW (ref 4.22–5.81)
RDW: 14.1 % (ref 11.5–15.5)
WBC: 10 10*3/uL (ref 4.0–10.5)
nRBC: 0 % (ref 0.0–0.2)

## 2018-12-05 LAB — COMPREHENSIVE METABOLIC PANEL
ALT: 11 U/L (ref 0–44)
AST: 16 U/L (ref 15–41)
Albumin: 1.9 g/dL — ABNORMAL LOW (ref 3.5–5.0)
Alkaline Phosphatase: 101 U/L (ref 38–126)
Anion gap: 13 (ref 5–15)
BUN: 24 mg/dL — AB (ref 6–20)
CHLORIDE: 91 mmol/L — AB (ref 98–111)
CO2: 24 mmol/L (ref 22–32)
CREATININE: 0.55 mg/dL — AB (ref 0.61–1.24)
Calcium: 8.4 mg/dL — ABNORMAL LOW (ref 8.9–10.3)
GFR calc Af Amer: >60 mL/min (ref >60)
Glucose, Bld: 64 mg/dL — ABNORMAL LOW (ref 70–99)
Potassium: 3 mmol/L — ABNORMAL LOW (ref 3.5–5.1)
Sodium: 128 mmol/L — ABNORMAL LOW (ref 135–145)
Total Bilirubin: 1.2 mg/dL (ref 0.3–1.2)
Total Protein: 5.1 g/dL — ABNORMAL LOW (ref 6.5–8.1)

## 2018-12-05 LAB — MAGNESIUM: Magnesium: 1.9 mg/dL (ref 1.7–2.4)

## 2018-12-05 MED ORDER — SODIUM CHLORIDE 0.9 % IV SOLN
INTRAVENOUS | Status: DC
  Administered 2018-12-05 (×2): via INTRAVENOUS

## 2018-12-05 MED ORDER — POTASSIUM CHLORIDE CRYS ER 20 MEQ PO TBCR
30.0000 meq | EXTENDED_RELEASE_TABLET | ORAL | Status: AC
  Administered 2018-12-05 (×2): 30 meq via ORAL
  Filled 2018-12-05 (×2): qty 1

## 2018-12-05 MED ORDER — HYDROMORPHONE HCL 1 MG/ML IJ SOLN
0.5000 mg | INTRAMUSCULAR | Status: DC | PRN
  Administered 2018-12-06 – 2018-12-07 (×3): 0.5 mg via INTRAVENOUS
  Filled 2018-12-05 (×3): qty 1

## 2018-12-05 MED ORDER — GADOBUTROL 1 MMOL/ML IV SOLN
6.0000 mL | Freq: Once | INTRAVENOUS | Status: AC | PRN
  Administered 2018-12-05: 6 mL via INTRAVENOUS

## 2018-12-05 MED ORDER — LEVOFLOXACIN 750 MG PO TABS
750.0000 mg | ORAL_TABLET | Freq: Every day | ORAL | Status: DC
  Administered 2018-12-06: 750 mg via ORAL
  Filled 2018-12-05: qty 1

## 2018-12-05 NOTE — Progress Notes (Signed)
Patient resting in bed.  This RN spoke to wife this am and she feels that patient is much more lethargic this am.  This RN able to easily arouse patient but he falls right back asleep.  MD's made aware.  New orders received for MRI of brain.  Patient lactic acid level came back today 2.2.  Dr. Bridgett Larsson aware, order for fluids received.  Wife continues to be at bedside.

## 2018-12-05 NOTE — Progress Notes (Signed)
Ruben Eaton   DOB:August 17, 1971   PP#:[id number]    Subjective: As per the wife patient has been sleeping/lethargic than usual.  He has not been able to get up to go to the bathroom overnight.  On further questioning patient denies any pain.  Objective:  Vitals:   12/05/18 0823 12/05/18 1559  BP: 95/64 104/75  Pulse: 96 93  Resp: 16   Temp: 97.6 F (36.4 C) 98.5 F (36.9 C)  SpO2: 98% 97%     Intake/Output Summary (Last 24 hours) at 12/05/2018 1946 Last data filed at 12/05/2018 1024 Gross per 24 hour  Intake 400 ml  Output -  Net 400 ml    Physical Exam  Constitutional: He is oriented to person, place, and time.  Cachectic appearing Caucasian male patient.  Is resting in bed.  Drowsy but easily arousable.  HENT:  Head: Normocephalic and atraumatic.  Mouth/Throat: Oropharynx is clear and moist. No oropharyngeal exudate.  Eyes: Pupils are equal, round, and reactive to light.  Neck: Normal range of motion. Neck supple.  Cardiovascular: Normal rate and regular rhythm.  Pulmonary/Chest: No respiratory distress. He has no wheezes.  Decreased air entry bilaterally right more than left.  Abdominal: Soft. Bowel sounds are normal. He exhibits no distension and no mass. There is no abdominal tenderness. There is no rebound and no guarding.  Musculoskeletal: Normal range of motion.        General: No tenderness or edema.     Comments: Right upper extremity swelling improved.  Neurological: He is oriented to person, place, and time.  Drowsy but arousable.  Skin: Skin is warm.  Psychiatric: Affect normal.      Labs:  Lab Results  Component Value Date   WBC 10.0 12/05/2018   HGB 8.5 (L) 12/05/2018   HCT 26.6 (L) 12/05/2018   MCV 98.2 12/05/2018   PLT 71 (L) 12/05/2018   NEUTROABS 8.7 (H) 12/05/2018    Lab Results  Component Value Date   NA 128 (L) 12/05/2018   K 3.0 (L) 12/05/2018   CL 91 (L) 12/05/2018   CO2 24 12/05/2018    Studies:  Mr Jeri Cos SA Contrast  Result  Date: 12/05/2018 CLINICAL DATA:  Initial evaluation for acute altered mental status, history of lung cancer. EXAM: MRI HEAD WITHOUT AND WITH CONTRAST TECHNIQUE: Multiplanar, multiecho pulse sequences of the brain and surrounding structures were obtained without and with intravenous contrast. CONTRAST:  60 cc of Gadavist. COMPARISON:  Prior MRI from 07/15/2018. FINDINGS: Brain: Examination moderately degraded by motion artifact. Cerebral volume within normal limits for age, stable from previous. Small remote infarct involving the right insular cortex again noted. Additional small remote infarcts involving the bilateral cerebellum noted as well. Appearance is stable from previous. There is a new 13 mm lesion involving the cortical gray matter of the right precentral gyrus (series 10, image 23). Lesion demonstrates hyperintense central T2 cystic intensity with associated rim enhancement (series 19, image 22). Small amount of surrounding FLAIR signal intensity suggestive of edema (series 16, image 21). Mild associated susceptibility artifact suggestive of small amount of blood products. Finding most concerning for a small metastatic lesion. Additionally, there are a few small foci of a adjacent diffusion abnormality measuring up to approximately 9 mm (series 5, image 40, 44). Associated faint enhancement about 1 of these foci involving the subcortical white matter (series 19, image 19). These foci are more indeterminate, and could reflect additional small lesions versus subacute infarcts. No other mass lesion or  abnormal enhancement identified on this motion degraded exam. No other evidence for acute or subacute ischemia. No other acute intracranial hemorrhage. No midline shift or significant mass effect. No hydrocephalus. No extra-axial fluid collection. Pituitary gland suprasellar region normal. Vascular: Major intracranial vascular flow voids are maintained. Skull and upper cervical spine: Craniocervical junction  within normal limits. No focal marrow replacing lesion. No scalp soft tissue abnormality. Sinuses/Orbits: Patient status post ocular lens replacement on the right. Globes and orbital soft tissues demonstrate no acute finding. Mucosal thickening with small retention cyst noted within the right maxillary sinus. Paranasal sinuses are otherwise clear. No mastoid effusion. Inner ear structures normal. Other: None. IMPRESSION: 1. 13 mm rim enhancing lesion involving the right precentral gyrus, most suspicious for a new metastatic lesion. Mild localized vasogenic edema without significant mass effect. 2. Few additional subcentimeter foci of diffusion abnormality and enhancement involving the adjacent posterior right frontal region as above, indeterminate. While these could reflect additional small/early metastatic lesions, possible small acute to subacute infarcts could also have this appearance. Short interval follow-up MRI in approximately 4-6 weeks suggested for further evaluation and to evaluate for interval change. 3. 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 Electronically Signed   By: Jeannine Boga M.D.   On: 12/05/2018 13:54    Primary cancer of right lower lobe of lung [facility]) #48 year old male patient with adenocarcinoma of the lung stage IV currently on Taxotere-Cyramza is admitted to hospital for progressive shortness of breath/fever  # Non-small cell lung cancer/stage IV-currently status post Taxotere Cyramza approximately 10 days ago-approximately 10 days ago.  CT scan March 9-shows progressive right lung disease and also new nodules on the left upper lobe/small pleural effusion.  Poor prognosis/ current therapy on hold given acute mental status changes-see below.  #Mental status changes-no focal deficits-question narcotic induced versus other causes. MRI brain reviewed with wife-13 mm enhancing lesion-concerning for  metastases and also some concern for acute or subacute infarct.  However it is not clear to me if MRI findings His lethargy. Discussed with wife regarding Narcan/ABG.  Declines further interventions given overall poor prognosis/discomfort in a patient without any clear benefit.    #Worsening shortness of breath fever-question pneumonia based on chest x-ray versus progressive malignancy-continue IV antibiotics for now.  #Right upper extremity brachial vein DVT-continue Eliquis/ColdPak.  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  Currently on MS Contin 30 BID; dilaudid 2mg  every 4 hours prn. [2-4/day].  Pain medications as needed for now.  # Patient is currently DNR/DNI; discussed with palliative care, Josh Borders.  Long discussion with wife regarding overall poor prognosis.  I would recommend to wait for clinical improvement/symptoms of lethargy for the next 24 to 48 hours.  Patient continues to decline/no significant improvement-recommend comfort measures/hospice.  Discussed with the wife she agrees.  Discussed with Dr. Bridgett Larsson.  # 40 minutes face-to-face with the patient discussing the above plan of care; more than 50% of time spent on prognosis/ natural history; counseling and coordination.      Cammie Sickle, MD 12/05/2018  7:46 PM

## 2018-12-05 NOTE — Progress Notes (Signed)
Advanced Care Plan.  Purpose of Encounter: CODE STATUS and comfort care. Parties in Attendance: The patient, his wife, mother-in-law and me. Patient's Decisional Capacity: Not sure at this time. Medical Story: Ruben Eaton  is a 48 y.o. male who presents with stroke, hypertension, OSA, PFO, and recently diagnosed lung cancer stage IV.  The patient is admitted for sepsis due to pneumonia.  He has been treated with antibiotics and IV fluid support. Oncology repeated a CT chest yesterday which shows progression of his malignancy.  CT chest showing complete atelectasis of the right lung.  Pulmonary consult obtained and they do not think that bronchoscopy would be beneficial this is likely tumor compressing the right lung rather than mucous plugging.  He became lethargic and confused.  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  I discussed with the patient's wife about his current condition, very poor prognosis, CODE STATUS and comfort care.  The patient's wife does not want him suffering but want to keep a limited code and she agrees comfort care if the patient's condition worsens. I discussed with Dr. Rogue Bussing. Goals of Care Determinations: Comfort care. Plan:  Code Status: Limited code. Time spent discussing advance care planning: 20 minutes.

## 2018-12-05 NOTE — Progress Notes (Signed)
[place] [facility] [place] NAME: Ruben Eaton    MR#:  [id number]  DATE OF BIRTH:  09/28/1970  SUBJECTIVE:   The patient has no complaints, on oxygen by nasal cannula 2 L.  But he is lethargic.  His condition is deteriorating. REVIEW OF SYSTEMS:    Review of Systems  Constitutional: Negative for chills and fever.  HENT: Negative for congestion and tinnitus.   Eyes: Negative for blurred vision and double vision.  Respiratory: Negative for cough, shortness of breath and wheezing.   Cardiovascular: Negative for chest pain, orthopnea and PND.  Gastrointestinal: Negative for abdominal pain, diarrhea, nausea and vomiting.  Genitourinary: Negative for dysuria and hematuria.  Neurological: Negative for dizziness, sensory change and focal weakness.  All other systems reviewed and are negative.   Nutrition: Heart Healthy Tolerating Diet: Yes Tolerating PT: await Eval.    DRUG ALLERGIES:  No Known Allergies  VITALS:  Blood pressure 95/64, pulse 96, temperature 97.6 F (36.4 C), temperature source Oral, resp. rate 16, height '5\' 6"'$  (1.676 m), weight 62.8 kg, SpO2 98 %.  PHYSICAL EXAMINATION:   Physical Exam  GENERAL:  48 y.o.-year-old patient lying in bed in no acute distress.  EYES: Pupils equal, round, reactive to light and accommodation. No scleral icterus. Extraocular muscles intact.  HEENT: Head atraumatic, normocephalic. Oropharynx and nasopharynx clear.  NECK:  Supple, no jugular venous distention. No thyroid enlargement, no tenderness.  LUNGS: Poor or no a/e on Right lung fields, crackles no rhonchi on the left side, no use of accessory muscles of respiration.  CARDIOVASCULAR: S1, S2 normal. No murmurs, rubs, or gallops. Right chest wall port-a-cath in place.   ABDOMEN: Soft, nontender, nondistended. Bowel sounds present. No organomegaly or mass.  EXTREMITIES: No cyanosis, clubbing or edema b/l.    NEUROLOGIC: Cranial nerves II  through XII are intact. No focal Motor or sensory deficits b/l.   PSYCHIATRIC: The patient is alert and oriented x 2.  SKIN: No obvious rash, lesion, or ulcer.    LABORATORY PANEL:   CBC Recent Labs  Lab 12/05/18 0904  WBC 10.0  HGB 8.5*  HCT 26.6*  PLT 71*   ------------------------------------------------------------------------------------------------------------------  Chemistries  Recent Labs  Lab 12/05/18 0904  NA 128*  K 3.0*  CL 91*  CO2 24  GLUCOSE 64*  BUN 24*  CREATININE 0.55*  CALCIUM 8.4*  MG 1.9  AST 16  ALT 11  ALKPHOS 101  BILITOT 1.2   ------------------------------------------------------------------------------------------------------------------  Cardiac Enzymes No results for input(s): TROPONINI in the last 168 hours. ------------------------------------------------------------------------------------------------------------------  RADIOLOGY:  Mr Jeri Cos Wo Contrast  Result Date: 12/05/2018 CLINICAL DATA:  Initial evaluation for acute altered mental status, history of lung cancer. EXAM: MRI HEAD WITHOUT AND WITH CONTRAST TECHNIQUE: Multiplanar, multiecho pulse sequences of the brain and surrounding structures were obtained without and with intravenous contrast. CONTRAST:  60 cc of Gadavist. COMPARISON:  Prior MRI from 07/15/2018. FINDINGS: Brain: Examination moderately degraded by motion artifact. Cerebral volume within normal limits for age, stable from previous. Small remote infarct involving the right insular cortex again noted. Additional small remote infarcts involving the bilateral cerebellum noted as well. Appearance is stable from previous. There is a new 13 mm lesion involving the cortical gray matter of the right precentral gyrus (series 10, image 23). Lesion demonstrates hyperintense central T2 cystic intensity with associated rim enhancement (series 19, image 22). Small amount of surrounding FLAIR signal intensity suggestive of edema  (series 16,  image 21). Mild associated susceptibility artifact suggestive of small amount of blood products. Finding most concerning for a small metastatic lesion. Additionally, there are a few small foci of a adjacent diffusion abnormality measuring up to approximately 9 mm (series 5, image 40, 44). Associated faint enhancement about 1 of these foci involving the subcortical white matter (series 19, image 19). These foci are more indeterminate, and could reflect additional small lesions versus subacute infarcts. No other mass lesion or abnormal enhancement identified on this motion degraded exam. No other evidence for acute or subacute ischemia. No other acute intracranial hemorrhage. No midline shift or significant mass effect. No hydrocephalus. No extra-axial fluid collection. Pituitary gland suprasellar region normal. Vascular: Major intracranial vascular flow voids are maintained. Skull and upper cervical spine: Craniocervical junction within normal limits. No focal marrow replacing lesion. No scalp soft tissue abnormality. Sinuses/Orbits: Patient status post ocular lens replacement on the right. Globes and orbital soft tissues demonstrate no acute finding. Mucosal thickening with small retention cyst noted within the right maxillary sinus. Paranasal sinuses are otherwise clear. No mastoid effusion. Inner ear structures normal. Other: None. IMPRESSION: 1. 13 mm rim enhancing lesion involving the right precentral gyrus, most suspicious for a new metastatic lesion. Mild localized vasogenic edema without significant mass effect. 2. Few additional subcentimeter foci of diffusion abnormality and enhancement involving the adjacent posterior right frontal region as above, indeterminate. While these could reflect additional small/early metastatic lesions, possible small acute to subacute infarcts could also have this appearance. Short interval follow-up MRI in approximately 4-6 weeks suggested for further evaluation  and to evaluate for interval change. 3. 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 Electronically Signed   By: Jeannine Boga M.D.   On: 12/05/2018 13:54     ASSESSMENT AND PLAN:   48 year old male with past medical history of stage IV lung cancer, previous TIA, hypertension, obstructive sleep apnea, history of previous CVA who presented to the hospital due to fever, shortness of breath.  1.  Sepsis-patient met criteria on admission given his fever, tachycardia, leukocytosis and chest x-ray findings suggestive of pneumonia. -Blood cultures remain negative.  Continue IV cefepime for now and can switch to oral Levaquin upon discharge.  2.  Pneumonia- suspected to be HCAP.  -Continue cefepime, cultures negative.  Patient is already on oxygen at home. - Likely discharge on oral Levaquin.  3.  Lung cancer-patient has stage IV lung cancer currently ongoing treatment with Dr. Rogue Bussing. -Oncology repeated a CT chest yesterday which shows progression of his malignancy.  CT chest showing complete atelectasis of the right lung.  Pulmonary consult obtained and they do not think that bronchoscopy would be beneficial this is likely tumor compressing the right lung rather than mucous plugging. -Appreciate oncology input and plan was for initiating chemo and also radiation as an outpatient in the next few weeks.  But the patient's condition is deteriorating. -Prognosis is quite poor and consider goals of comfort care.  Acute encephalopathy, possible related to chronic metastasis. For MRI of the brain, 13 mm rim enhancing lesion involving the right precentral gyrus, most suspicious for a new metastatic lesion. Mild localized vasogenic edema without significant mass effect.  4.  Anxiety/depression-continue Ativan, Cymbalta.  5. Essential HTN -hold metoprolol due to low side blood pressure.  6. Hx of previous PE - cont.  Xarelto.   7. Superficial Right upper Ext. VTE - cont. Xarelto, keep arm elevated, Warm compress and supportive  care.   Hypokalemia.  Potassium supplement. Hyponatremia.  Normal saline IV and encourage oral intake. Lactic acidosis.  IV fluid support.  I discussed with Dr. Rogue Bussing. All the records are reviewed and case discussed with Care Management/Social Worker. Management plans discussed with the patient, wife and mother-in-law and they are in agreement.  CODE STATUS: Partial Code  DVT Prophylaxis: Xarelto  TOTAL TIME TAKING CARE OF THIS PATIENT: 30 minutes.   POSSIBLE D/C IN 1-2 DAYS, DEPENDING ON CLINICAL CONDITION.   Demetrios Loll M.D on 12/05/2018 at 3:04 PM  Between 7am to 6pm - Pager - [phone]  After 6pm go to [email or web address] - Proofreader  [facility]  Office  [phone]  CC: Primary care physician; Dion Body, MD

## 2018-12-05 NOTE — Consult Note (Signed)
[place]  [phone] [phone]   Name: Ruben Eaton Date: 12/05/2018 MRN: [id number]  DOB: 03-25-1971  Patient Care Team: Dion Body, MD as PCP - General (Family Medicine) [name], MD as PCP - Cardiology (Cardiology) Telford Nab, RN as Registered Nurse Broadus John, Trude Mcburney, RN as [facility]    REASON FOR CONSULTATION: Palliative Care consult requested for this 48 y.o. male with multiple medical problems including stage IV non-small cell lung cancer currently on Avastin and Tecentriq but is had rapid disease progression.  He has had severe chest wall pain related to his malignancy and is status post XRT but has presented to the ER several times for management of his pain.  Patient was admitted on 12/10/2018 with sepsis from presumed R. Sided PNA. CT of the chest shows progressive disease with complete R. Lung atelectasis due to tumor burden, mediastinal shift, and reduced lung volumes/infiltrate in the L. Lung.Mar[place] Kitchen His hospitalization has been complicated by AMS. MRI of the brain revealed small mass in the R. Precentral gyrus with mild vasogenic edema and a few additional indeterminate foci either reflecting early metastasis vs. Infarct. Patient was referred to palliative care to help  address goals.  SOCIAL HISTORY:    Patient is married and lives at home with his wife.  He does not have any biological children but has a stepdaughter who is grown.  Patient still works as a Software engineer at [employer].  ADVANCE DIRECTIVES:  Patient has a living will and [facility] POA   CODE STATUS: DNR (MOST form completed 11/22/18)  PAST MEDICAL HISTORY: Past Medical History:  Diagnosis Date   Anorexia    Cryptogenic stroke ([place]) 08/2016   a. 09/15/2016 in setting of PFO.   CVA (cerebral vascular accident) ([place]) 09/07/2016   ED (erectile dysfunction)    Essential (primary) hypertension 02/01/2016    History of stroke 10/08/2016   Hx of insomnia    Hypertension    OSA on CPAP    PFO (patent foramen ovale)    a. s/p closure 09/2016 by Dr. [name].   Pleural effusion on right 02/21/2018   Primary cancer of right lower lobe of lung ([place]) 02/25/2018   Chemo tx's and rad tx's   PVC's (premature ventricular contractions) 10/08/2016   Quadriceps tendinitis 02/01/2016   Superior spurring    TIA (transient ischemic attack) 09/07/2016    PAST SURGICAL HISTORY:  Past Surgical History:  Procedure Laterality Date   APPENDECTOMY  1980   EYE SURGERY     PATENT FORAMEN OVALE CLOSURE  10/07/2016   PATENT FORAMEN OVALE CLOSURE N/A 10/07/2016   Procedure: Patent [place](PFO) Closure;  Surgeon: [name], MD;  Location: [place] [facility];  Service: Cardiovascular;  Laterality: N/A;   PORTACATH PLACEMENT N/A 02/23/2018   Procedure: INSERTION PORT-A-CATH;  Surgeon: Nestor Lewandowsky, MD;  Location: [facility] ORS;  Service: Thoracic;  Laterality: N/A;   STRABISMUS SURGERY Left 1984   TEE WITHOUT CARDIOVERSION N/A 09/08/2016   Procedure: TRANSESOPHAGEAL ECHOCARDIOGRAM (TEE);  Surgeon: [name], MD;  Location: [facility] ORS;  Service: Cardiovascular;  Laterality: N/A;   VIDEO ASSISTED THORACOSCOPY (VATS) W/TALC PLEUADESIS Right 02/23/2018   Procedure: VIDEO ASSISTED THORACOSCOPY (VATS) W/TALC PLEUADESIS;  Surgeon: Nestor Lewandowsky, MD;  Location: [facility] ORS;  Service: Thoracic;  Laterality: Right;   VITRECTOMY Right 2013    HEMATOLOGY/ONCOLOGY HISTORY:  Oncology History   # June 2019- LUNG CA- HIGH GRADE CA with sarcomatoid  features [EGFR MUTATED; exon 19; clinically non-small cell];Dr.Jeffrey Carlis Abbott; II opinion at [facility];  # June 17th- carbo-taxol #1; PROGRESSION June 26th 2019- Osimertinib 4m/day; [facility]; June 28th, 2019]; reirradiation right chest wall finished radiation December 10th [Dr.Kinard]  # Oct 2019- Severe progression in chest; START  carbo-Taxol-Tecentriq-Avastin x4 cycles; 10/09/2018- PR/?  Increasing infiltrate right upper lobe [?mixed response]; 1/16-Tecen-Avastin; CT- Jan 27th- Progressive right flank mass; last Tecen- Avastin-   # FEB 27th 2020- Tax-Cyramza- 02/06.   # Repeat chest Bx- Nov 2019- high grade Non-small cell with sarcomatoid features  # Dec 3rd 2019- Left Lung PE- lovenox  # HTN/ cryptogenic stroke;  # MOLECULAR TESTING- EGFR exon 19 deletion [omniseq]; Oct 2019- Plasma/guadant tetsing- EGFR exon 19del*   DIAGNOSIS: non-small cell lung ca  STAGE:  IV  ;GOALS: palliative  CURRENT/MOST RECENT THERAPY- TAX-RAM      Primary cancer of right [place] lobe of lung ([place]   11/22/2018 -  Chemotherapy    The patient had pegfilgrastim-cbqv (UDENYCA) injection 6 mg, 6 mg, Subcutaneous, Once, 1 of 6 cycles Administration: 6 mg (11/23/2018) DOCEtaxel (TAXOTERE) 130 mg in sodium chloride 0.9 % 250 mL chemo infusion, 75 mg/m2 = 130 mg, Intravenous,  Once, 1 of 6 cycles Administration: 130 mg (11/22/2018) ramucirumab (CYRAMZA) 600 mg in sodium chloride 0.9 % 190 mL chemo infusion, 10 mg/kg = 600 mg, Intravenous, Once, 1 of 6 cycles Administration: 600 mg (11/22/2018)  for chemotherapy treatment.      ALLERGIES:  has No Known Allergies.  MEDICATIONS:  Current Facility-Administered Medications  Medication Dose Route Frequency Provider Last Rate Last Dose   0.9 %  sodium chloride infusion   Intravenous PRN WLance Coon MD   Stopped at 12/03/18 0724   0.9 %  sodium chloride infusion   Intravenous Continuous CDemetrios Loll MD 100 mL/hr at 12/05/18 1049     acetaminophen (TYLENOL) tablet 650 mg  650 mg Oral Q6H PRN WLance Coon MD       Or   acetaminophen (TYLENOL) suppository 650 mg  650 mg Rectal Q6H PRN WLance Coon MD       benzonatate (TESSALON) capsule 200 mg  200 mg Oral TID PRN WLance Coon MD       ceFEPIme (MAXIPIME) 2 g in sodium chloride 0.9 % 100 mL IVPB  2 g Intravenous Q8H CDemetrios Loll MD  200 mL/hr at 12/05/18 1405 2 g at 12/05/18 1405   DULoxetine (CYMBALTA) DR capsule 30 mg  30 mg Oral Daily WLance Coon MD   30 mg at 12/05/18 0941   feeding supplement (PRO-STAT SUGAR FREE 64) liquid 30 mL  30 mL Oral BID SHenreitta Leber MD   30 mL at 12/05/18 0959   furosemide (LASIX) tablet 20 mg  20 mg Oral Daily PRN WLance Coon MD   20 mg at 12/02/18 0919   guaiFENesin-dextromethorphan (ROBITUSSIN DM) 100-10 MG/5ML syrup 5 mL  5 mL Oral Q4H PRN WLance Coon MD       HYDROmorphone (DILAUDID) injection 0.5 mg  0.5 mg Intravenous Q4H PRN Dezi Schaner, JKirt Boys NP       ibuprofen (ADVIL,MOTRIN) tablet 800 mg  800 mg Oral Daily PRN WLance Coon MD       ipratropium-albuterol (DUONEB) 0.5-2.5 (3) MG/3ML nebulizer solution 3 mL  3 mL Nebulization Q4H PRN WLance Coon MD       [START ON 12/06/2018] levofloxacin (LEVAQUIN) tablet 750 mg  750 mg Oral Daily CDemetrios Loll MD  LORazepam (ATIVAN) tablet 0.5 mg  0.5 mg Oral Q8H PRN [name], MD   0.5 mg at 12/04/18 2204   morphine (MS CONTIN) 12 hr tablet 30 mg  30 mg Oral Laurence Spates, MD   30 mg at 12/05/18 0388   multivitamin with minerals tablet 1 tablet  1 tablet Oral Daily Henreitta Leber, MD   1 tablet at 12/05/18 1730   nystatin (MYCOSTATIN) 100000 UNIT/ML suspension 500,000 Units  5 mL Mouth/Throat QID Henreitta Leber, MD   500,000 Units at 12/05/18 1730   ondansetron (ZOFRAN) tablet 4 mg  4 mg Oral Q6H PRN [name], MD       Or   ondansetron [facility]) injection 4 mg  4 mg Intravenous Q6H PRN [name], MD       phenol (CHLORASEPTIC) mouth spray 1 spray  1 spray Mouth/Throat PRN Henreitta Leber, MD   1 spray at 12/02/18 1921   polyethylene glycol (MIRALAX / GLYCOLAX) packet 17 g  17 g Oral Daily PRN [name], MD   17 g at 12/04/18 0935   rivaroxaban (XARELTO) tablet 20 mg  20 mg Oral Q supper [name], MD   20 mg at 12/05/18 1730   zolpidem (AMBIEN) tablet 10 mg  10 mg Oral QHS PRN  [name], MD        VITAL SIGNS: BP 104/75    Pulse 93    Temp 98.5 F (36.9 C) (Oral)    Resp 16    Ht 5' 6" (1.676 m)    Wt 138 lb 8 oz (62.8 kg)    SpO2 97%    BMI 22.35 kg/m  Filed Weights   12/03/18 0442 12/04/18 0446 12/05/18 0311  Weight: 135 lb 11.2 oz (61.6 kg) 135 lb (61.2 kg) 138 lb 8 oz (62.8 kg)    Estimated body mass index is 22.35 kg/m as calculated from the following:   Height as of this encounter: 5' 6" (1.676 m).   Weight as of this encounter: 138 lb 8 oz (62.8 kg).  LABS: CBC:    Component Value Date/Time   WBC 10.0 12/05/2018 0904   HGB 8.5 (L) 12/05/2018 0904   HGB 13.2 10/05/2016 0848   HCT 26.6 (L) 12/05/2018 0904   HCT 39.1 10/05/2016 0848   PLT 71 (L) 12/05/2018 0904   PLT 250 10/05/2016 0848   MCV 98.2 12/05/2018 0904   MCV 92 10/05/2016 0848   NEUTROABS 8.7 (H) 12/05/2018 0904   NEUTROABS 1.9 10/05/2016 0848   LYMPHSABS 0.4 (L) 12/05/2018 0904   LYMPHSABS 1.9 10/05/2016 0848   MONOABS 0.6 12/05/2018 0904   EOSABS 0.0 12/05/2018 0904   EOSABS 0.2 10/05/2016 0848   BASOSABS 0.0 12/05/2018 0904   BASOSABS 0.0 10/05/2016 0848   Comprehensive Metabolic Panel:    Component Value Date/Time   NA 128 (L) 12/05/2018 0904   NA 141 10/05/2016 0848   K 3.0 (L) 12/05/2018 0904   K 4.3 05/15/2013 0612   CL 91 (L) 12/05/2018 0904   CO2 24 12/05/2018 0904   BUN 24 (H) 12/05/2018 0904   BUN 17 10/05/2016 0848   CREATININE 0.55 (L) 12/05/2018 0904   GLUCOSE 64 (L) 12/05/2018 0904   CALCIUM 8.4 (L) 12/05/2018 0904   AST 16 12/05/2018 0904   ALT 11 12/05/2018 0904   ALKPHOS 101 12/05/2018 0904   BILITOT 1.2 12/05/2018 0904   PROT 5.1 (L) 12/05/2018 0904   ALBUMIN 1.9 (L) 12/05/2018 8280  RADIOGRAPHIC STUDIES: °Ct Chest W Contrast ° °Result Date: 12/03/2018 °CLINICAL DATA:  Stage IV adenocarcinoma of the lung. EXAM: CT CHEST WITH CONTRAST TECHNIQUE: Multidetector CT imaging of the chest was performed during intravenous contrast administration.  CONTRAST:  75mL OMNIPAQUE IOHEXOL 300 MG/ML  SOLN COMPARISON:  10/09/2018 FINDINGS: Cardiovascular: The heart size is normal. No substantial pericardial effusion. Persistent mass-effect on the inferior left atrium. Mediastinum/Nodes: Interval progression of right apical tumor with incorporation of the 12 mm right paratracheal lymph node measured on the prior study. Mediastinal component posterior to the heart measures 7.6 x 10.9 cm today compared to 6.6 x 8.8 cm when measured at the same level and in the same axes on the prior study. Lungs/Pleura: Marked involvement of the right thoracic wall/pleural space again noted. Anterior component measured previously at 6.3 x 4.3 cm is now 8.3 x 4.9 cm. Mass-effect on the mediastinum evident with leftward displacement. Similar appearance of mass-effect on right middle and lower lobe airways. Very little residual aerated right lung evident on today's study compatible with tumor progression. New areas of left lung involvement identified on today's exam including irregular 1.8 x 1.4 cm left lower lobe nodule and 1.9 x 1.5 cm anterior left upper lobe nodule. Numerous other tiny left lung nodules evident with new pleural nodularity diffusely in the left hemithorax. Small left pleural effusion is new in the interval. Upper Abdomen: Although incompletely visualize, there is probable necrotic lymphadenopathy in the upper abdomen which appears new since 10/24/2018. Musculoskeletal: No worrisome lytic or sclerotic osseous abnormality. IMPRESSION: 1. Interval progression of the disease in the right hemithorax with almost no residual aerated right lung on today's exam. 2. Interval development of diffuse pleural nodularity in the left hemithorax with new areas of irregular nodularity in the parenchyma of the left lung. 3. New small left pleural effusion. 4. Although incompletely visualized, there appears to be necrotic lymphadenopathy in the upper abdomen, new since 10/24/2018.  Electronically Signed   By: Eric  Mansell M.D.   On: 12/03/2018 09:13  ° °Mr Brain W Wo Contrast ° °Result Date: 12/05/2018 °CLINICAL DATA:  Initial evaluation for acute altered mental status, history of lung cancer. EXAM: MRI HEAD WITHOUT AND WITH CONTRAST TECHNIQUE: Multiplanar, multiecho pulse sequences of the brain and surrounding structures were obtained without and with intravenous contrast. CONTRAST:  60 cc of Gadavist. COMPARISON:  Prior MRI from 07/15/2018. FINDINGS: Brain: Examination moderately degraded by motion artifact. Cerebral volume within normal limits for age, stable from previous. Small remote infarct involving the right insular cortex again noted. Additional small remote infarcts involving the bilateral cerebellum noted as well. Appearance is stable from previous. There is a new 13 mm lesion involving the cortical gray matter of the right precentral gyrus (series 10, image 23). Lesion demonstrates hyperintense central T2 cystic intensity with associated rim enhancement (series 19, image 22). Small amount of surrounding FLAIR signal intensity suggestive of edema (series 16, image 21). Mild associated susceptibility artifact suggestive of small amount of blood products. Finding most concerning for a small metastatic lesion. Additionally, there are a few small foci of a adjacent diffusion abnormality measuring up to approximately 9 mm (series 5, image 40, 44). Associated faint enhancement about 1 of these foci involving the subcortical white matter (series 19, image 19). These foci are more indeterminate, and could reflect additional small lesions versus subacute infarcts. No other mass lesion or abnormal enhancement identified on this motion degraded exam. No other evidence for acute or subacute ischemia. No other acute   intracranial hemorrhage. No midline shift or significant mass effect. No hydrocephalus. No extra-axial fluid collection. Pituitary gland suprasellar region normal. Vascular: Major  intracranial vascular flow voids are maintained. Skull and upper cervical spine: Craniocervical junction within normal limits. No focal marrow replacing lesion. No scalp soft tissue abnormality. Sinuses/Orbits: Patient status post ocular lens replacement on the right. Globes and orbital soft tissues demonstrate no acute finding. Mucosal thickening with small retention cyst noted within the right maxillary sinus. Paranasal sinuses are otherwise clear. No mastoid effusion. Inner ear structures normal. Other: None. IMPRESSION: 1. 13 mm rim enhancing lesion involving the right precentral gyrus, most suspicious for a new metastatic lesion. Mild localized vasogenic edema without significant mass effect. 2. Few additional subcentimeter foci of diffusion abnormality and enhancement involving the adjacent posterior right frontal region as above, indeterminate. While these could reflect additional small/early metastatic lesions, possible small acute to subacute infarcts could also have this appearance. Short interval follow-up MRI in approximately 4-6 weeks suggested for further evaluation and to evaluate for interval change. 3. 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 Electronically Signed   By: Jeannine Boga M.D.   On: 12/05/2018 13:54   US Venous Img Upper Uni Right  Result Date: 12/03/2018 CLINICAL DATA:  Right upper extremity edema. History of lung cancer. History of pulmonary embolism. Evaluate for DVT. EXAM: RIGHT UPPER EXTREMITY VENOUS DOPPLER ULTRASOUND TECHNIQUE: Gray-scale sonography with graded compression, as well as color Doppler and duplex ultrasound were performed to evaluate the upper extremity deep venous system from the level of the subclavian vein and including the jugular, axillary, basilic, radial, ulnar and upper cephalic vein. Spectral Doppler was utilized to evaluate flow at rest and with distal augmentation  maneuvers. COMPARISON:  None. FINDINGS: Contralateral Subclavian Vein: Respiratory phasicity is normal and symmetric with the symptomatic side. No evidence of thrombus. Normal compressibility. Internal Jugular Vein: No evidence of thrombus. Normal compressibility, respiratory phasicity and response to augmentation. Subclavian Vein: No evidence of thrombus. Normal compressibility, respiratory phasicity and response to augmentation. Axillary Vein: No evidence of thrombus. Normal compressibility, respiratory phasicity and response to augmentation. Cephalic Vein: No evidence of thrombus. Normal compressibility, respiratory phasicity and response to augmentation. Basilic Vein: No evidence of thrombus. Normal compressibility, respiratory phasicity and response to augmentation. Brachial Veins: There is short-segment age-indeterminate nonocclusive wall thickening involving 1 of the paired brachial veins (image 22 and 23). The adjacent brachial vein appears widely patent. Radial Veins: No evidence of thrombus. Normal compressibility, respiratory phasicity and response to augmentation. Ulnar Veins: No evidence of thrombus. Normal compressibility, respiratory phasicity and response to augmentation. Venous Reflux:  None visualized. Other Findings:  None visualized. IMPRESSION: 1. Examination is positive for Short-segment age-indeterminate nonocclusive wall thickening/DVT involving one of the paired right brachial veins. The adjacent paired brachial vein appears widely patent. 2. Otherwise, normal right upper extremity venous Doppler ultrasound. Electronically Signed   By: Sandi Mariscal M.D.   On: 12/03/2018 09:59   Dg Chest Portable 1 View  Result Date: 12/02/2018 CLINICAL DATA:  Severe dyspnea.  History of cancer. EXAM: PORTABLE CHEST 1 VIEW COMPARISON:  11/30/2018 at 1721 hours FINDINGS: Near complete opacification of the right hemithorax sparing the right hilar region. Slight interval worsening of pulmonary consolidation  noted at the right base since prior. Mild interstitial edema the left lung persists. Obscuration of the right heart border is noted. Port catheter tip is unchanged projecting to the cavoatrial level. No aggressive osseous  abnormality. IMPRESSION: Interval slight worsening of right base pulmonary opacity since most recent exam. Electronically Signed   By: David  Kwon M.D.   On: 12/14/2018 23:37  ° °Dg Chest Port 1 View ° °Result Date: 12/05/2018 °CLINICAL DATA:  Fever and shortness of breath today. On chemotherapy for lung cancer. EXAM: PORTABLE CHEST 1 VIEW COMPARISON:  Chest radiograph October 15, 2018 FINDINGS: Worsening opacification RIGHT hemithorax with air bronchograms. Interstitial prominence LEFT lung increased from prior examination. No LEFT pleural effusion or focal consolidation. Cardiac silhouette is predominantly obscured. No pneumothorax. Single-lumen RIGHT chest Port-A-Cath via RIGHT internal jugular venous approach with distal tip projecting distal superior vena cava. No pneumothorax. Old LEFT rib fracture. IMPRESSION: 1. Worsening opacity of RIGHT lung concerning disease progression though given increased interstitial prominence on the LEFT, atypical infection or pulmonary edema possible. Electronically Signed   By: Courtnay  Bloomer M.D.   On: 12/09/2018 20:53  ° ° °PERFORMANCE STATUS (ECOG) : 4 - Bedbound ° °REVIEW OF SYSTEMS: °As noted above. Otherwise, a complete review of systems is negative. ° °PHYSICAL EXAM: °General: NAD, frail appearing, thin °Cardiovascular: regular rate and rhythm °Pulmonary: minimal air movement R. Lung, coarse L. Lung, some labored breathing noted when he tries to talk °Abdomen: soft, nontender, + bowel sounds °Extremities: edema °Skin: no rashes °Neurological: Weakness, alert, follows simple commands, some confusion ° °IMPRESSION: °Patient is known to me from the clinic. ° °Patient has been evaluated by oncology.  Case discussed at length with Dr. Brahmanday, who has  also met with patient's wife.  Patient is not felt to currently be a treatment candidate.   ° °I met with patient's wife to discuss goals of care.  She says that she recognizes the patient might be nearing end-of-life.  She says her primary goal at this point is to ensure the patient is comfortable no matter what happens.  She is interested in seeing if he might improve over the next 24 hours.  She does not wish to escalate care should he decline. In the absence of improvement, she verbalized a desire to focus on his comfort.  We discussed comfort care in detail.  We talked about possible locations in which he might receive end-of-life care.  Wife says that [facility] and would be okay with him transferring there if needed. ° °We discussed CODE STATUS in detail.  I had previously completed a MOST form with patient and wife in the clinic.  Patient had verbalized a desire not to be intubated or have his life prolonged artificially on machines.  Wife says that patient had changed his mind about intubation when he was admitted to the hospital.  However, in light of his decline, wife no longer thinks the patient would want to be intubated.  Wife says that she would like to change his CODE STATUS to DNR. ° °At wife's request, I called and spoke with patient's sister, Sarah Noland, who is a pharmacist in [place].  I updated her on patient's current medical status and my conversation with wife regarding goals of care.  Sister also felt that patient would want to be kept as comfortable as possible in the event of clinical decline. ° °PLAN: °-Continue current scope of treatment °-Anticipate possible transition to comfort care if no clinical improvement in the next 24 hours °-Wife wants to ensure patient is comfortable no matter the outcome °-DNR ° ° °Time Total: 60 minutes ° °Visit consisted of counseling and education dealing with the   complex and emotionally intense issues of symptom management  and palliative care in the setting of serious and potentially life-threatening illness.Greater than 50%  of this time was spent counseling and coordinating care related to the above assessment and plan.  Signed by: Altha Harm, PhD, NP-C [phone] (Work Cell)

## 2018-12-05 NOTE — Progress Notes (Signed)
Social worker provided counseling to patient and spouse at the hospital.

## 2018-12-05 NOTE — Progress Notes (Signed)
Patient returned to floor from MRI. Reconnected to tele and fluids restarted. Wife at bedside.  Appears comfortable at this time.

## 2018-12-06 ENCOUNTER — Other Ambulatory Visit: Payer: Self-pay

## 2018-12-06 DIAGNOSIS — A419 Sepsis, unspecified organism: Principal | ICD-10-CM

## 2018-12-06 DIAGNOSIS — E43 Unspecified severe protein-calorie malnutrition: Secondary | ICD-10-CM

## 2018-12-06 DIAGNOSIS — G893 Neoplasm related pain (acute) (chronic): Secondary | ICD-10-CM

## 2018-12-06 LAB — CULTURE, BLOOD (ROUTINE X 2)
Culture: NO GROWTH
Culture: NO GROWTH

## 2018-12-06 MED ORDER — FUROSEMIDE 10 MG/ML IJ SOLN: 20.0000 mg | Freq: Once | INTRAMUSCULAR | Status: AC

## 2018-12-06 MED ORDER — FUROSEMIDE 10 MG/ML IJ SOLN
INTRAMUSCULAR | Status: AC
  Administered 2018-12-06: 05:00:00
  Filled 2018-12-06: qty 2

## 2018-12-06 NOTE — Progress Notes (Signed)
Pt c/o SOB, Crackles auscultated, MD made aware, IVF stopped and 20 mg of IV lasix given. Will continue to monitor.

## 2018-12-06 NOTE — Progress Notes (Signed)
Daily Progress Note   Patient Name: Ruben Eaton       Date: 12/06/2018 DOB: 1970-10-23  Age: 48 y.o. MRN#: [id number] Attending Physician: Demetrios Loll, MD Primary Care Physician: Dion Body, MD Admit Date: 12/25/2018  Reason for Consultation/Follow-up: Establishing goals of care  Subjective: Patient awake but confused. C/o pain. RN at bedside giving scheduled MS Contin. Does not engage in conversation.  GOC:   Spoke with wife, Ruben Eaton, in family waiting room. Discussed events leading up to admission and course of hospital diagnoses, interventions, and prognosis. Reviewed MRI results with Ruben Eaton. She is most surprised by how quickly his cognitive status has changed since last week (when he was still working). She confirms her most important goal is comfort and pain management. Discussed EOL expectations and disease trajectory. Further discussed hospice options including hospice services at home versus hospice facility. Ruben Eaton is considering options. She shares that she has time to make this decision after conversation with Dr. Bridgett Larsson and his plan to keep inpatient for 'a few more days.'  Ruben Eaton shares her belief that Ruben Eaton may know he is dying in the fact that he mentioned he wasn't going to be able to go back to work from this hospitalization.   Therapeutic listening and emotional support provided. Ruben Eaton declines any needs for symptom management medication adjustments.   Length of Stay: 5  Current Medications: Scheduled Meds:  . DULoxetine  30 mg Oral Daily  . feeding supplement (PRO-STAT SUGAR FREE 64)  30 mL Oral BID  . levofloxacin  750 mg Oral Daily  . morphine  30 mg Oral Q12H  . multivitamin with minerals  1 tablet Oral Daily  . nystatin  5 mL Mouth/Throat QID  .  rivaroxaban  20 mg Oral Q supper    Continuous Infusions: . sodium chloride Stopped (12/03/18 0724)    PRN Meds: sodium chloride, acetaminophen **OR** acetaminophen, benzonatate, furosemide, guaiFENesin-dextromethorphan, HYDROmorphone (DILAUDID) injection, ibuprofen, ipratropium-albuterol, LORazepam, ondansetron **OR** ondansetron (ZOFRAN) IV, phenol, polyethylene glycol, zolpidem  Physical Exam Vitals signs and nursing note reviewed.  Constitutional:      Appearance: He is cachectic. He is ill-appearing.  HENT:     Head: Normocephalic.  Pulmonary:     Effort: No tachypnea, accessory muscle usage or respiratory distress.  Skin:    General: Skin is warm and dry.  Neurological:     Mental Status: He is easily aroused.     Comments: Drowsy, confused             Vital Signs: BP 96/71 (BP Location: Left Arm)   Pulse (!) 108   Temp 98.2 F (36.8 C) (Oral)   Resp 19   Ht 5\' 6"  (1.676 m)   Wt 61.9 kg   SpO2 99%   BMI 22.02 kg/m  SpO2: SpO2: 99 % O2 Device: O2 Device: Nasal Cannula O2 Flow Rate: O2 Flow Rate (L/min): 3 L/min  Intake/output summary:   Intake/Output Summary (Last 24 hours) at 12/06/2018 1620 Last data filed at 12/05/2018 2145 Gross per 24 hour  Intake -  Output 125 ml  Net -125 ml   LBM: Last BM Date: 12/05/18 Baseline Weight: Weight: 60.3 kg Most recent weight: Weight: 61.9 kg       Palliative Assessment/Data: PPS 40%    Flowsheet Rows     Most Recent Value  Intake Tab  Referral Department  Hospitalist  Unit at Time of Referral  Med/Surg Unit  Palliative Care Primary Diagnosis  Cancer  Date Notified  12/05/18  Palliative Care Type  New Palliative care  Reason for referral  Clarify Goals of Care  Date of Admission  12/22/2018  Date first seen by Palliative Care  12/05/18  # of days Palliative referral response time  0 Day(s)  # of days IP prior to Palliative referral  4  Clinical Assessment  Psychosocial & Spiritual Assessment  Palliative  Care Outcomes      Patient Active Problem List   Diagnosis Date Noted  . Palliative care encounter   . Protein-calorie malnutrition, severe 12/04/2018  . Sepsis ([place]) 12/11/2018  . HCAP (healthcare-associated pneumonia) 11/30/2018  . Hypertension   . 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 Non-small cell cancer of left lung ([place]) 03/22/2018  . Anasarca 03/21/2018  . Acute respiratory failure with hypoxia ([place]) 03/21/2018  . Pure hypercholesterolemia 03/08/2018  . Goals of care, counseling/discussion 03/02/2018  . Anxiety 03/02/2018  . Primary cancer of right lower lobe of lung ([place]) 02/25/2018  . Pleural effusion 02/21/2018  . Vaccine counseling 09/01/2017  . OSA on CPAP 10/27/2016  . History of stroke 10/08/2016  . PVC's (premature ventricular contractions) 10/08/2016  . PFO (patent foramen ovale) 10/07/2016  . TIA (transient ischemic attack) 09/07/2016  . CVA (cerebral vascular accident) ([place]) 09/07/2016  . 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 Essential (primary) hypertension 02/01/2016  . H/O disease 02/01/2016  . Quadriceps tendinitis 02/01/2016    Palliative Care Assessment & Plan   Patient Profile: Palliative Care consult requested for this48 y.o.malewith multiple medical problems including stage IV non-small cell lung cancer currently on Avastin and Tecentriq but is had rapid disease progression. He has had severe chest wall pain related to his malignancy and is status post XRT but has presented to the ER several times for management of his pain. Patient was admitted on 11/29/2018 with sepsis from presumed R. Sided PNA. CT of the chest shows progressive disease with complete R. Lung atelectasis due to tumor burden, mediastinal shift, and reduced lung volumes/infiltrate in the L. Lung.Marland Kitchen His hospitalization has been complicated by AMS. MRI of the brain revealed small mass in the R. Precentral gyrus with mild vasogenic edema and a few additional indeterminate foci  either reflecting early metastasis vs. Infarct. Patient was referred to palliative care to help  address goals.  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 DNR.  Further discussion with wife regarding GOC. Leaning towards comfort focused care and transition to hospice. She is considering hospice options (home versus hospice facility). If requiring increased doses of IV pain medications, recommend inpatient hospice facility.   Code Status: DNR   Code Status Orders  (From admission, onward)         Start     Ordered   12/05/18 1813  Do not attempt resuscitation (DNR)  Continuous    Question Answer Comment  In the event of cardiac or respiratory ARREST Do not call a "code blue"   In the event of cardiac or respiratory ARREST Do not perform Intubation, CPR, defibrillation or ACLS   In the event of cardiac or respiratory ARREST Use medication by any route, position, wound care, and other measures to relive pain and suffering. May use oxygen, suction and manual treatment of airway obstruction as needed for comfort.   Comments No trach/peg.      12/05/18 1814        Code Status History    Date Active Date Inactive Code Status Order ID Comments User Context   12/02/2018 0052 12/05/2018 1814 Partial Code [id number]  [name], MD ED   03/21/2018 1643 03/27/2018 1654 Partial Code [id number]  Rush Farmer, MD Inpatient   03/21/2018 0249 03/21/2018 1643 Full Code [id number]  Rise Patience, MD Inpatient   02/21/2018 1849 02/27/2018 1653 Full Code [id number]  [name], MD ED   10/07/2016 1244 10/08/2016 1701 Full Code [id number]  [name], MD Inpatient   09/07/2016 0533 09/08/2016 1532 Full Code [id number]  Saundra Shelling, MD Inpatient    Advance Directive Documentation     Most Recent Value  Type of Advance Directive  Out of  facility DNR (pink MOST or yellow form), Living will, Healthcare Power of Attorney  Pre-existing out of facility DNR order (yellow form or pink MOST form)  -  "MOST" Form in Place?  -       Prognosis:   Poor prognosis with metastatic stage IV lung cancer, declining functional/cognitive/nutritional status  Discharge Planning:  To Be Determined   Care plan was discussed with patient, wife, RN  Thank you for allowing the Palliative Medicine Team to assist in the care of this patient.   Time In: 1515 Time Out: 1555 Total Time 40 Prolonged Time Billed  no      Greater than 50%  of this time was spent counseling and coordinating care related to the above assessment and plan.  Ihor Dow, FNP-C Palliative Medicine Team  Phone: [phone] Fax: [phone]  Please contact Palliative Medicine Team phone at [phone] for questions and concerns.

## 2018-12-06 NOTE — Progress Notes (Signed)
D/c telemetry monitoring per Dr. Bridgett Larsson

## 2018-12-06 NOTE — Progress Notes (Signed)
Refusing bed alarm. Wife assisting patient with urinal use. Educated on safety.

## 2018-12-06 NOTE — Progress Notes (Signed)
[facility] [place] NAME: Ruben Eaton    MR#:  [id number]  DATE OF BIRTH:  October 11, 1970  SUBJECTIVE:   The patient has no complaints, on oxygen by nasal cannula 2 L.  But he is confused and lethargic. REVIEW OF SYSTEMS:    Review of Systems  Constitutional: Negative for chills and fever.  HENT: Negative for congestion and tinnitus.   Eyes: Negative for blurred vision and double vision.  Respiratory: Negative for cough, shortness of breath and wheezing.   Cardiovascular: Negative for chest pain, orthopnea and PND.  Gastrointestinal: Negative for abdominal pain, diarrhea, nausea and vomiting.  Genitourinary: Negative for dysuria and hematuria.  Neurological: Negative for dizziness, sensory change and focal weakness.  All other systems reviewed and are negative.  DRUG ALLERGIES:  No Known Allergies  VITALS:  Blood pressure 96/71, pulse (!) 108, temperature 98.2 F (36.8 C), temperature source Oral, resp. rate 19, height '5\' 6"'$  (1.676 m), weight 61.9 kg, SpO2 99 %.  PHYSICAL EXAMINATION:   Physical Exam  GENERAL:  47 y.o.-year-old patient lying in bed in no acute distress.  EYES: Pupils equal, round, reactive to light and accommodation. No scleral icterus. Extraocular muscles intact.  HEENT: Head atraumatic, normocephalic. Oropharynx and nasopharynx clear.  NECK:  Supple, no jugular venous distention. No thyroid enlargement, no tenderness.  LUNGS: Poor or no a/e on Right lung fields, crackles no rhonchi on the left side, no use of accessory muscles of respiration.  CARDIOVASCULAR: S1, S2 normal. No murmurs, rubs, or gallops. Right chest wall port-a-cath in place.   ABDOMEN: Soft, nontender, nondistended. Bowel sounds present. No organomegaly or mass.  EXTREMITIES: No cyanosis, clubbing or edema b/l.    NEUROLOGIC: Cranial nerves II through XII are intact. No focal Motor or sensory deficits b/l.   PSYCHIATRIC: The patient is alert  and oriented x 2.  SKIN: No obvious rash, lesion, or ulcer.    LABORATORY PANEL:   CBC Recent Labs  Lab 12/05/18 0904  WBC 10.0  HGB 8.5*  HCT 26.6*  PLT 71*   ------------------------------------------------------------------------------------------------------------------  Chemistries  Recent Labs  Lab 12/05/18 0904  NA 128*  K 3.0*  CL 91*  CO2 24  GLUCOSE 64*  BUN 24*  CREATININE 0.55*  CALCIUM 8.4*  MG 1.9  AST 16  ALT 11  ALKPHOS 101  BILITOT 1.2   ------------------------------------------------------------------------------------------------------------------  Cardiac Enzymes No results for input(s): TROPONINI in the last 168 hours. ------------------------------------------------------------------------------------------------------------------  RADIOLOGY:  Mr Jeri Cos Wo Contrast  Result Date: 12/05/2018 CLINICAL DATA:  Initial evaluation for acute altered mental status, history of lung cancer. EXAM: MRI HEAD WITHOUT AND WITH CONTRAST TECHNIQUE: Multiplanar, multiecho pulse sequences of the brain and surrounding structures were obtained without and with intravenous contrast. CONTRAST:  60 cc of Gadavist. COMPARISON:  Prior MRI from 07/15/2018. FINDINGS: Brain: Examination moderately degraded by motion artifact. Cerebral volume within normal limits for age, stable from previous. Small remote infarct involving the right insular cortex again noted. Additional small remote infarcts involving the bilateral cerebellum noted as well. Appearance is stable from previous. There is a new 13 mm lesion involving the cortical gray matter of the right precentral gyrus (series 10, image 23). Lesion demonstrates hyperintense central T2 cystic intensity with associated rim enhancement (series 19, image 22). Small amount of surrounding FLAIR signal intensity suggestive of edema (series 16, image 21). Mild associated susceptibility artifact suggestive of small amount of blood  products. Finding most concerning  for a small metastatic lesion. Additionally, there are a few small foci of a adjacent diffusion abnormality measuring up to approximately 9 mm (series 5, image 40, 44). Associated faint enhancement about 1 of these foci involving the subcortical white matter (series 19, image 19). These foci are more indeterminate, and could reflect additional small lesions versus subacute infarcts. No other mass lesion or abnormal enhancement identified on this motion degraded exam. No other evidence for acute or subacute ischemia. No other acute intracranial hemorrhage. No midline shift or significant mass effect. No hydrocephalus. No extra-axial fluid collection. Pituitary gland suprasellar region normal. Vascular: Major intracranial vascular flow voids are maintained. Skull and upper cervical spine: Craniocervical junction within normal limits. No focal marrow replacing lesion. No scalp soft tissue abnormality. Sinuses/Orbits: Patient status post ocular lens replacement on the right. Globes and orbital soft tissues demonstrate no acute finding. Mucosal thickening with small retention cyst noted within the right maxillary sinus. Paranasal sinuses are otherwise clear. No mastoid effusion. Inner ear structures normal. Other: None. IMPRESSION: 1. 13 mm rim enhancing lesion involving the right precentral gyrus, most suspicious for a new metastatic lesion. Mild localized vasogenic edema without significant mass effect. 2. Few additional subcentimeter foci of diffusion abnormality and enhancement involving the adjacent posterior right frontal region as above, indeterminate. While these could reflect additional small/early metastatic lesions, possible small acute to subacute infarcts could also have this appearance. Short interval follow-up MRI in approximately 4-6 weeks suggested for further evaluation and to evaluate for interval change. 3. Otherwise stable appearance of the brain with small chronic  infarcts involving the right insula and bilateral cerebellum. Electronically Signed   By: Jeannine Boga M.D.   On: 12/05/2018 13:54     ASSESSMENT AND PLAN:   48 year old male with past medical history of stage IV lung cancer, previous TIA, hypertension, obstructive sleep apnea, history of previous CVA who presented to the hospital due to fever, shortness of breath.  1.  Sepsis-patient met criteria on admission given his fever, tachycardia, leukocytosis and chest x-ray findings suggestive of pneumonia. -Blood cultures remain negative.  Completed IV cefepime and switch to oral Levaquin.  2.  Pneumonia- suspected to be HCAP.  -Completedcefepime, cultures negative.  Patient is already on oxygen at home. Change to oral Levaquin.  3.  Lung cancer-patient has stage IV lung cancer currently ongoing treatment with Dr. Rogue Bussing. -Oncology repeated a CT chest yesterday which shows progression of his malignancy.  CT chest showing complete atelectasis of the right lung.  Pulmonary consult obtained and they do not think that bronchoscopy would be beneficial this is likely tumor compressing the right lung rather than mucous plugging. -Appreciate oncology input and plan was for initiating chemo and also radiation as an outpatient in the next few weeks.  But the patient's condition is deteriorating. -Prognosis is quite poor and consider goals of comfort care.  Acute encephalopathy, possible related to chronic metastasis. For MRI of the brain, 13 mm rim enhancing lesion involving the right precentral gyrus, most suspicious for a new metastatic lesion. Mild localized vasogenic edema without significant mass effect.  4.  Anxiety/depression-continue Ativan, Cymbalta.  5. Essential HTN -hold metoprolol due to low side blood pressure.  6. Hx of previous PE - cont. Xarelto.   7. Superficial Right upper Ext. VTE - cont. Xarelto, keep arm elevated, Warm compress and supportive care.   Hypokalemia.   Potassium supplement. Hyponatremia.  Normal saline IV and encourage oral intake. Lactic acidosis.  IV fluid support.  CODE  STATUS is changed to DNR. All the records are reviewed and case discussed with Care Management/Social Worker. Management plans discussed with the patient, wife and they are in agreement.  CODE STATUS: DNR.  DVT Prophylaxis: Xarelto  TOTAL TIME TAKING CARE OF THIS PATIENT: 27 minutes.   POSSIBLE D/C IN ? DAYS, DEPENDING ON CLINICAL CONDITION.   Demetrios Loll M.D on 12/06/2018 at 2:39 PM  Between 7am to 6pm - Pager - [phone]  After 6pm go to [email or web address] - Proofreader  [facility]  Office  [phone]  CC: Primary care physician; Dion Body, MD

## 2018-12-06 NOTE — Progress Notes (Signed)
Ruben Eaton   DOB:[birth year]   VO#:[id number]    Subjective: Patient is lethargic sleepy.  However as per wife patient was more lucid yesterday.  He is here again MS Contin in the morning for his pain.  Objective:  Vitals:   12/06/18 1808 12/06/18 1924  BP: 105/73 (!) 98/59  Pulse: (!) 113 (!) 110  Resp:  20  Temp: (!) 97.5 F (36.4 C) 98.2 F (36.8 C)  SpO2: 98% 98%     Intake/Output Summary (Last 24 hours) at 12/06/2018 2034 Last data filed at 12/05/2018 2145 Gross per 24 hour  Intake -  Output 125 ml  Net -125 ml    Physical Exam  Constitutional: He is oriented to person, place, and time.  Cachectic appearing Caucasian male patient.  Is resting in bed.  Drowsy; not arousable.  HENT:  Head: Normocephalic and atraumatic.  Mouth/Throat: Oropharynx is clear and moist. No oropharyngeal exudate.  Eyes: Pupils are equal, round, and reactive to light.  Neck: Normal range of motion. Neck supple.  Cardiovascular: Normal rate and regular rhythm.  Pulmonary/Chest: No respiratory distress. He has no wheezes.  Decreased air entry bilaterally right more than left.  Abdominal: Soft. Bowel sounds are normal. He exhibits no distension and no mass. There is no abdominal tenderness. There is no rebound and no guarding.  Musculoskeletal: Normal range of motion.        General: No tenderness or edema.     Comments: Right upper extremity swelling improved.  Neurological: He is oriented to person, place, and time.  Drowsy not arousable.  Skin: Skin is warm.  Psychiatric: Affect normal.      Labs:  Lab Results  Component Value Date   WBC 10.0 12/05/2018   HGB 8.5 (L) 12/05/2018   HCT 26.6 (L) 12/05/2018   MCV 98.2 12/05/2018   PLT 71 (L) 12/05/2018   NEUTROABS 8.7 (H) 12/05/2018    Lab Results  Component Value Date   NA 128 (L) 12/05/2018   K 3.0 (L) 12/05/2018   CL 91 (L) 12/05/2018   CO2 24 12/05/2018    Studies:  Mr Jeri Cos VQ Contrast  Result Date:  12/05/2018 CLINICAL DATA:  Initial evaluation for acute altered mental status, history of lung cancer. EXAM: MRI HEAD WITHOUT AND WITH CONTRAST TECHNIQUE: Multiplanar, multiecho pulse sequences of the brain and surrounding structures were obtained without and with intravenous contrast. CONTRAST:  60 cc of Gadavist. COMPARISON:  Prior MRI from 07/15/2018. FINDINGS: Brain: Examination moderately degraded by motion artifact. Cerebral volume within normal limits for age, stable from previous. Small remote infarct involving the right insular cortex again noted. Additional small remote infarcts involving the bilateral cerebellum noted as well. Appearance is stable from previous. There is a new 13 mm lesion involving the cortical gray matter of the right precentral gyrus (series 10, image 23). Lesion demonstrates hyperintense central T2 cystic intensity with associated rim enhancement (series 19, image 22). Small amount of surrounding FLAIR signal intensity suggestive of edema (series 16, image 21). Mild associated susceptibility artifact suggestive of small amount of blood products. Finding most concerning for a small metastatic lesion. Additionally, there are a few small foci of a adjacent diffusion abnormality measuring up to approximately 9 mm (series 5, image 40, 44). Associated faint enhancement about 1 of these foci involving the subcortical white matter (series 19, image 19). These foci are more indeterminate, and could reflect additional small lesions versus subacute infarcts. No other mass lesion or abnormal enhancement identified  on this motion degraded exam. No other evidence for acute or subacute ischemia. No other acute intracranial hemorrhage. No midline shift or significant mass effect. No hydrocephalus. No extra-axial fluid collection. Pituitary gland suprasellar region normal. Vascular: Major intracranial vascular flow voids are maintained. Skull and upper cervical spine: Craniocervical junction within  normal limits. No focal marrow replacing lesion. No scalp soft tissue abnormality. Sinuses/Orbits: Patient status post ocular lens replacement on the right. Globes and orbital soft tissues demonstrate no acute finding. Mucosal thickening with small retention cyst noted within the right maxillary sinus. Paranasal sinuses are otherwise clear. No mastoid effusion. Inner ear structures normal. Other: None. IMPRESSION: 1. 13 mm rim enhancing lesion involving the right precentral gyrus, most suspicious for a new metastatic lesion. Mild localized vasogenic edema without significant mass effect. 2. Few additional subcentimeter foci of diffusion abnormality and enhancement involving the adjacent posterior right frontal region as above, indeterminate. While these could reflect additional small/early metastatic lesions, possible small acute to subacute infarcts could also have this appearance. Short interval follow-up MRI in approximately 4-6 weeks suggested for further evaluation and to evaluate for interval change. 3. 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 Electronically Signed   By: Jeannine Boga M.D.   On: 12/05/2018 13:54    Primary cancer of right lower lobe of lung [facility]) #48 year old male patient with adenocarcinoma of the lung stage IV currently on Taxotere-Cyramza is admitted to hospital for progressive shortness of breath/fever  # Non-small cell lung cancer/stage IV-currently status post Taxotere Cyramza approximately 10 days ago-approximately 10 days ago.  CT scan March 9-shows progressive right lung disease and also new nodules on the left upper lobe/small pleural effusion.  Not a candidate for any therapy.  Recommend comfort care/hospice.  #Mental status changes-no focal deficits-question narcotic induced. MRI brain reviewed with wife-13 mm enhancing lesion-concerning for metastases and also some concern for acute or  subacute infarct.  Would not recommend further work-up  #Right upper extremity brachial vein DVT-continue Eliquis.   #Chest wall pain-continue pain medication understanding patient will be drowsy-goal being comfort.  Discussed with the patient's wife.  She understands.  Awaiting patient's sister visiting from [place].  Potential discharge hospice/defer to palliative care.  Discussed with the patient's wife.       Cammie Sickle, MD 12/06/2018  8:34 PM

## 2018-12-07 LAB — BASIC METABOLIC PANEL
Anion gap: 12 (ref 5–15)
BUN: 21 mg/dL — ABNORMAL HIGH (ref 6–20)
CO2: 26 mmol/L (ref 22–32)
Calcium: 8.5 mg/dL — ABNORMAL LOW (ref 8.9–10.3)
Chloride: 91 mmol/L — ABNORMAL LOW (ref 98–111)
Creatinine, Ser: 0.57 mg/dL — ABNORMAL LOW (ref 0.61–1.24)
GFR calc Af Amer: >60 mL/min (ref >60)
GFR calc non Af Amer: >60 mL/min (ref >60)
Glucose, Bld: 73 mg/dL (ref 70–99)
Potassium: 3.1 mmol/L — ABNORMAL LOW (ref 3.5–5.1)
Sodium: 129 mmol/L — ABNORMAL LOW (ref 135–145)

## 2018-12-07 MED ORDER — POTASSIUM CHLORIDE CRYS ER 20 MEQ PO TBCR: 40.0000 meq | EXTENDED_RELEASE_TABLET | Freq: Two times a day (BID) | ORAL | Status: DC

## 2018-12-07 MED ORDER — POTASSIUM CHLORIDE 20 MEQ/15ML (10%) PO SOLN: 40.0000 meq | Freq: Once | ORAL | Status: DC

## 2018-12-07 MED ORDER — HYDROMORPHONE HCL 1 MG/ML IJ SOLN
0.5000 mg | INTRAMUSCULAR | Status: DC | PRN
  Administered 2018-12-07 – 2018-12-08 (×4): 1 mg via INTRAVENOUS
  Administered 2018-12-08: 21:00:00 0.75 mg via INTRAVENOUS
  Filled 2018-12-07 (×5): qty 1

## 2018-12-07 NOTE — Progress Notes (Signed)
[facility]  [phone] [phone]   Name: Ruben Eaton Date: 12/07/2018 MRN: [id number]  DOB: 10-13-1970  Patient Care Team: Dion Body, MD as PCP - General (Family Medicine) [name], MD as PCP - Cardiology (Cardiology) Telford Nab, RN as Registered Nurse Broadus John, Trude Mcburney, RN as [facility]    REASON FOR CONSULTATION: Palliative Care consult requested for [phone] y.o.malewith multiple medical problems including stage IV non-small cell lung cancer currently on Avastin and Tecentriq but is had rapid disease progression. He has had severe chest wall pain related to his malignancy and is status post XRT but has presented to the ER several times for management of his pain. Patient was admitted on 12/03/2018 with sepsis from presumed R. Sided PNA. CT of the chest shows progressive disease with complete R. Lung atelectasis due to tumor burden, mediastinal shift, and reduced lung volumes/infiltrate in the L. Lung.Marland Kitchen His hospitalization has been complicated by AMS. MRI of the brain revealed small mass in the R. Precentral gyrus with mild vasogenic edema and a few additional indeterminate foci either reflecting early metastasis vs. Infarct. Patient was referred to palliative care to help  address goals.  CODE STATUS: DNR  PAST MEDICAL HISTORY: Past Medical History:  Diagnosis Date   Anorexia    Cryptogenic stroke ([place]) 08/2016   a. 09/15/2016 in setting of PFO.   CVA (cerebral vascular accident) ([place]) 09/07/2016   ED (erectile dysfunction)    Essential (primary) hypertension 02/01/2016   History of stroke 10/08/2016   Hx of insomnia    Hypertension    OSA on CPAP    PFO (patent foramen ovale)    a. s/p closure 09/2016 by Dr. [name].   Pleural effusion on right 02/21/2018   Primary cancer of right lower lobe of lung ([place]) 02/25/2018   Chemo tx's and rad tx's   PVC's  (premature ventricular contractions) 10/08/2016   Quadriceps tendinitis 02/01/2016   Superior spurring    TIA (transient ischemic attack) 09/07/2016    PAST SURGICAL HISTORY:  Past Surgical History:  Procedure Laterality Date   APPENDECTOMY  1980   EYE SURGERY     PATENT FORAMEN OVALE CLOSURE  10/07/2016   PATENT FORAMEN OVALE CLOSURE N/A 10/07/2016   Procedure: Patent [place](PFO) Closure;  Surgeon: [name], MD;  Location: [facility];  Service: Cardiovascular;  Laterality: N/A;   PORTACATH PLACEMENT N/A 02/23/2018   Procedure: INSERTION PORT-A-CATH;  Surgeon: Nestor Lewandowsky, MD;  Location: [facility];  Service: Thoracic;  Laterality: N/A;   STRABISMUS SURGERY Left 1984   TEE WITHOUT CARDIOVERSION N/A 09/08/2016   Procedure: TRANSESOPHAGEAL ECHOCARDIOGRAM (TEE);  Surgeon: [name], MD;  Location: [facility];  Service: Cardiovascular;  Laterality: N/A;   VIDEO ASSISTED THORACOSCOPY (VATS) W/TALC PLEUADESIS Right 02/23/2018   Procedure: VIDEO ASSISTED THORACOSCOPY (VATS) W/TALC PLEUADESIS;  Surgeon: Nestor Lewandowsky, MD;  Location: [facility];  Service: Thoracic;  Laterality: Right;   VITRECTOMY Right 2013    HEMATOLOGY/ONCOLOGY HISTORY:  Oncology History   # June 2019- LUNG CA- HIGH GRADE CA with sarcomatoid features [EGFR MUTATED; exon 19; clinically non-small cell];Dr.Jeffrey Carlis Abbott; II opinion at [facility];  # June 17th- carbo-taxol #1; PROGRESSION June 26th 2019- Osimertinib 25m/day; S[facility]; June 28th, 2019]; reirradiation right chest wall finished radiation December 10th [Dr.Kinard]  # Oct 2019- Severe progression in chest; START carbo-Taxol-Tecentriq-Avastin x4 cycles; 10/09/2018- PR/?  Increasing infiltrate right upper lobe [?mixed  response]; 1/16-Tecen-Avastin; CT- Jan 27th- Progressive right flank mass; last Tecen- Avastin-   # FEB 27th 2020- Tax-Cyramza- 02/06.   # Repeat chest Bx- Nov 2019- high grade Non-small cell with  sarcomatoid features  # Dec 3rd 2019- Left Lung PE- lovenox  # HTN/ cryptogenic stroke;  # MOLECULAR TESTING- EGFR exon 19 deletion [omniseq]; Oct 2019- Plasma/guadant tetsing- EGFR exon 19del*   DIAGNOSIS: non-small cell lung ca  STAGE:  IV  ;GOALS: palliative  CURRENT/MOST RECENT THERAPY- TAX-RAM      Primary cancer of right lower lobe of lung ([place])   11/22/2018 -  Chemotherapy    The patient had pegfilgrastim-cbqv (UDENYCA) injection 6 mg, 6 mg, Subcutaneous, Once, 1 of 6 cycles Administration: 6 mg (11/23/2018) DOCEtaxel (TAXOTERE) 130 mg in sodium chloride 0.9 % 250 mL chemo infusion, 75 mg/m2 = 130 mg, Intravenous,  Once, 1 of 6 cycles Administration: 130 mg (11/22/2018) ramucirumab (CYRAMZA) 600 mg in sodium chloride 0.9 % 190 mL chemo infusion, 10 mg/kg = 600 mg, Intravenous, Once, 1 of 6 cycles Administration: 600 mg (11/22/2018)  for chemotherapy treatment.      ALLERGIES:  has No Known Allergies.  MEDICATIONS:  Current Facility-Administered Medications  Medication Dose Route Frequency Provider Last Rate Last Dose   0.9 %  sodium chloride infusion   Intravenous PRN [name], MD   Stopped at 12/03/18 [phone]   acetaminophen (TYLENOL) tablet 650 mg  650 mg Oral Q6H PRN [name], MD       Or   acetaminophen (TYLENOL) suppository 650 mg  650 mg Rectal Q6H PRN [name], MD       benzonatate (TESSALON) capsule 200 mg  200 mg Oral TID PRN [name], MD       DULoxetine (CYMBALTA) DR capsule 30 mg  30 mg Oral Daily [name], MD   30 mg at 12/06/18 0945   feeding supplement (PRO-STAT SUGAR FREE 64) liquid 30 mL  30 mL Oral BID Henreitta Leber, MD   30 mL at 12/06/18 2154   furosemide (LASIX) tablet 20 mg  20 mg Oral Daily PRN [name], MD   20 mg at 12/02/18 0919   guaiFENesin-dextromethorphan (ROBITUSSIN DM) 100-10 MG/5ML syrup 5 mL  5 mL Oral Q4H PRN [name], MD       HYDROmorphone (DILAUDID) injection 0.5 mg  0.5 mg Intravenous Q4H  PRN Marlin Brys, Kirt Boys, NP   0.5 mg at 12/07/18 1240   ibuprofen (ADVIL,MOTRIN) tablet 800 mg  800 mg Oral Daily PRN [name], MD       ipratropium-albuterol (DUONEB) 0.5-2.5 (3) MG/3ML nebulizer solution 3 mL  3 mL Nebulization Q4H PRN [name], MD       levofloxacin [facility]) tablet 750 mg  750 mg Oral Daily Demetrios Loll, MD   750 mg at 12/06/18 0945   LORazepam (ATIVAN) tablet 0.5 mg  0.5 mg Oral Q8H PRN [name], MD   0.5 mg at 12/07/18 0426   morphine (MS CONTIN) 12 hr tablet 30 mg  30 mg Oral Laurence Spates, MD   30 mg at 12/07/18 0222   multivitamin with minerals tablet 1 tablet  1 tablet Oral Daily Henreitta Leber, MD   1 tablet at 12/06/18 1744   nystatin (MYCOSTATIN) 100000 UNIT/ML suspension 500,000 Units  5 mL Mouth/Throat QID Henreitta Leber, MD   500,000 Units at 12/06/18 1744   ondansetron (ZOFRAN) tablet 4 mg  4 mg Oral Q6H PRN [name], MD  Or   ondansetron (ZOFRAN) injection 4 mg  4 mg Intravenous Q6H PRN [name], MD       phenol (CHLORASEPTIC) mouth spray 1 spray  1 spray Mouth/Throat PRN Henreitta Leber, MD   1 spray at 12/02/18 1921   polyethylene glycol (MIRALAX / GLYCOLAX) packet 17 g  17 g Oral Daily PRN [name], MD   17 g at 12/04/18 0935   potassium chloride 20 MEQ/15ML (10%) solution 40 mEq  40 mEq Oral Once Demetrios Loll, MD       rivaroxaban Alveda Reasons) tablet 20 mg  20 mg Oral Q supper [name], MD   20 mg at 12/06/18 1744   zolpidem (AMBIEN) tablet 10 mg  10 mg Oral QHS PRN [name], MD        VITAL SIGNS: BP 103/76    Pulse (!) 114    Temp (!) 96.1 F (35.6 C) (Axillary)    Resp 18    Ht _0  (1.676 m)    Wt 136 lb 6.4 oz (61.9 kg)    SpO2 98%    BMI 22.02 kg/m  Filed Weights   12/04/18 0446 12/05/18 0311 12/06/18 0256  Weight: 135 lb (61.2 kg) 138 lb 8 oz (62.8 kg) 136 lb 6.4 oz (61.9 kg)    Estimated body mass index is 22.02 kg/m as calculated from the following:   Height as of this encounter:  _1  (1.676 m).   Weight as of this encounter: 136 lb 6.4 oz (61.9 kg).  LABS: CBC:    Component Value Date/Time   WBC 10.0 12/05/2018 0904   HGB 8.5 (L) 12/05/2018 0904   HGB 13.2 10/05/2016 0848   HCT 26.6 (L) 12/05/2018 0904   HCT 39.1 10/05/2016 0848   PLT 71 (L) 12/05/2018 0904   PLT 250 10/05/2016 0848   MCV 98.2 12/05/2018 0904   MCV 92 10/05/2016 0848   NEUTROABS 8.7 (H) 12/05/2018 0904   NEUTROABS 1.9 10/05/2016 0848   LYMPHSABS 0.4 (L) 12/05/2018 0904   LYMPHSABS 1.9 10/05/2016 0848   MONOABS 0.6 12/05/2018 0904   EOSABS 0.0 12/05/2018 0904   EOSABS 0.2 10/05/2016 0848   BASOSABS 0.0 12/05/2018 0904   BASOSABS 0.0 10/05/2016 0848   Comprehensive Metabolic Panel:    Component Value Date/Time   NA 129 (L) 12/07/2018 0307   NA 141 10/05/2016 0848   K 3.1 (L) 12/07/2018 0307   K 4.3 05/15/2013 0612   CL 91 (L) 12/07/2018 0307   CO2 26 12/07/2018 0307   BUN 21 (H) 12/07/2018 0307   BUN 17 10/05/2016 0848   CREATININE 0.57 (L) 12/07/2018 0307   GLUCOSE 73 12/07/2018 0307   CALCIUM 8.5 (L) 12/07/2018 0307   AST 16 12/05/2018 0904   ALT 11 12/05/2018 0904   ALKPHOS 101 12/05/2018 0904   BILITOT 1.2 12/05/2018 0904   PROT 5.1 (L) 12/05/2018 0904   ALBUMIN 1.9 (L) 12/05/2018 0904    RADIOGRAPHIC STUDIES: Ct Chest W Contrast  Result Date: 12/03/2018 CLINICAL DATA:  Stage IV adenocarcinoma of the lung. EXAM: CT CHEST WITH CONTRAST TECHNIQUE: Multidetector CT imaging of the chest was performed during intravenous contrast administration. CONTRAST:  71m OMNIPAQUE IOHEXOL 300 MG/ML  SOLN COMPARISON:  10/09/2018 FINDINGS: Cardiovascular: The heart size is normal. No substantial pericardial effusion. Persistent mass-effect on the inferior left atrium. Mediastinum/Nodes: Interval progression of right apical tumor with incorporation of the 12 mm right paratracheal lymph node measured on the prior study. Mediastinal component posterior  to the heart measures 7.6 x 10.9 cm  today compared to 6.6 x 8.8 cm when measured at the same level and in the same axes on the prior study. Lungs/Pleura: Marked involvement of the right thoracic wall/pleural space again noted. Anterior component measured previously at 6.3 x 4.3 cm is now 8.3 x 4.9 cm. Mass-effect on the mediastinum evident with leftward displacement. Similar appearance of mass-effect on right middle and lower lobe airways. Very little residual aerated right lung evident on today's study compatible with tumor progression. New areas of left lung involvement identified on today's exam including irregular 1.8 x 1.4 cm left lower lobe nodule and 1.9 x 1.5 cm anterior left upper lobe nodule. Numerous other tiny left lung nodules evident with new pleural nodularity diffusely in the left hemithorax. Small left pleural effusion is new in the interval. Upper Abdomen: Although incompletely visualize, there is probable necrotic lymphadenopathy in the upper abdomen which appears new since 10/24/2018. Musculoskeletal: No worrisome lytic or sclerotic osseous abnormality. IMPRESSION: 1. Interval progression of the disease in the right hemithorax with almost no residual aerated right lung on today's exam. 2. Interval development of diffuse pleural nodularity in the left hemithorax with new areas of irregular nodularity in the parenchyma of the left lung. 3. New small left pleural effusion. 4. Although incompletely visualized, there appears to be necrotic lymphadenopathy in the upper abdomen, new since 10/24/2018. Electronically Signed   By: Misty Stanley M.D.   On: 12/03/2018 09:13   Mr Jeri Cos EK Contrast  Result Date: 12/05/2018 CLINICAL DATA:  Initial evaluation for acute altered mental status, history of lung cancer. EXAM: MRI HEAD WITHOUT AND WITH CONTRAST TECHNIQUE: Multiplanar, multiecho pulse sequences of the brain and surrounding structures were obtained without and with intravenous contrast. CONTRAST:  60 cc of Gadavist. COMPARISON:   Prior MRI from 07/15/2018. FINDINGS: Brain: Examination moderately degraded by motion artifact. Cerebral volume within normal limits for age, stable from previous. Small remote infarct involving the right insular cortex again noted. Additional small remote infarcts involving the bilateral cerebellum noted as well. Appearance is stable from previous. There is a new 13 mm lesion involving the cortical gray matter of the right precentral gyrus (series 10, image 23). Lesion demonstrates hyperintense central T2 cystic intensity with associated rim enhancement (series 19, image 22). Small amount of surrounding FLAIR signal intensity suggestive of edema (series 16, image 21). Mild associated susceptibility artifact suggestive of small amount of blood products. Finding most concerning for a small metastatic lesion. Additionally, there are a few small foci of a adjacent diffusion abnormality measuring up to approximately 9 mm (series 5, image 40, 44). Associated faint enhancement about 1 of these foci involving the subcortical white matter (series 19, image 19). These foci are more indeterminate, and could reflect additional small lesions versus subacute infarcts. No other mass lesion or abnormal enhancement identified on this motion degraded exam. No other evidence for acute or subacute ischemia. No other acute intracranial hemorrhage. No midline shift or significant mass effect. No hydrocephalus. No extra-axial fluid collection. Pituitary gland suprasellar region normal. Vascular: Major intracranial vascular flow voids are maintained. Skull and upper cervical spine: Craniocervical junction within normal limits. No focal marrow replacing lesion. No scalp soft tissue abnormality. Sinuses/Orbits: Patient status post ocular lens replacement on the right. Globes and orbital soft tissues demonstrate no acute finding. Mucosal thickening with small retention cyst noted within the right maxillary sinus. Paranasal sinuses are  otherwise clear. No mastoid effusion. Inner ear structures normal.  Other: None. IMPRESSION: 1. 13 mm rim enhancing lesion involving the right precentral gyrus, most suspicious for a new metastatic lesion. Mild localized vasogenic edema without significant mass effect. 2. Few additional subcentimeter foci of diffusion abnormality and enhancement involving the adjacent posterior right frontal region as above, indeterminate. While these could reflect additional small/early metastatic lesions, possible small acute to subacute infarcts could also have this appearance. Short interval follow-up MRI in approximately 4-6 weeks suggested for further evaluation and to evaluate for interval change. 3. 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 Electronically Signed   By: Jeannine Boga M.D.   On: 12/05/2018 13:54   US Venous Img Upper Uni Right  Result Date: 12/03/2018 CLINICAL DATA:  Right upper extremity edema. History of lung cancer. History of pulmonary embolism. Evaluate for DVT. EXAM: RIGHT UPPER EXTREMITY VENOUS DOPPLER ULTRASOUND TECHNIQUE: Gray-scale sonography with graded compression, as well as color Doppler and duplex ultrasound were performed to evaluate the upper extremity deep venous system from the level of the subclavian vein and including the jugular, axillary, basilic, radial, ulnar and upper cephalic vein. Spectral Doppler was utilized to evaluate flow at rest and with distal augmentation maneuvers. COMPARISON:  None. FINDINGS: Contralateral Subclavian Vein: Respiratory phasicity is normal and symmetric with the symptomatic side. No evidence of thrombus. Normal compressibility. Internal Jugular Vein: No evidence of thrombus. Normal compressibility, respiratory phasicity and response to augmentation. Subclavian Vein: No evidence of thrombus. Normal compressibility, respiratory phasicity and response to augmentation.  Axillary Vein: No evidence of thrombus. Normal compressibility, respiratory phasicity and response to augmentation. Cephalic Vein: No evidence of thrombus. Normal compressibility, respiratory phasicity and response to augmentation. Basilic Vein: No evidence of thrombus. Normal compressibility, respiratory phasicity and response to augmentation. Brachial Veins: There is short-segment age-indeterminate nonocclusive wall thickening involving 1 of the paired brachial veins (image 22 and 23). The adjacent brachial vein appears widely patent. Radial Veins: No evidence of thrombus. Normal compressibility, respiratory phasicity and response to augmentation. Ulnar Veins: No evidence of thrombus. Normal compressibility, respiratory phasicity and response to augmentation. Venous Reflux:  None visualized. Other Findings:  None visualized. IMPRESSION: 1. Examination is positive for Short-segment age-indeterminate nonocclusive wall thickening/DVT involving one of the paired right brachial veins. The adjacent paired brachial vein appears widely patent. 2. Otherwise, normal right upper extremity venous Doppler ultrasound. Electronically Signed   By: Sandi Mariscal M.D.   On: 12/03/2018 09:59   Dg Chest Portable 1 View  Result Date: 12/21/2018 CLINICAL DATA:  Severe dyspnea.  History of cancer. EXAM: PORTABLE CHEST 1 VIEW COMPARISON:  12/10/2018 at 1721 hours FINDINGS: Near complete opacification of the right hemithorax sparing the right hilar region. Slight interval worsening of pulmonary consolidation noted at the right base since prior. Mild interstitial edema the left lung persists. Obscuration of the right heart border is noted. Port catheter tip is unchanged projecting to the cavoatrial level. No aggressive osseous abnormality. IMPRESSION: Interval slight worsening of right base pulmonary opacity since most recent exam. Electronically Signed   By: Ashley Royalty M.D.   On: 12/14/2018 23:37   Dg Chest Port 1 View  Result Date:  12/02/2018 CLINICAL DATA:  Fever and shortness of breath today. On chemotherapy for lung cancer. EXAM: PORTABLE CHEST 1 VIEW COMPARISON:  Chest radiograph October 15, 2018 FINDINGS: Worsening opacification RIGHT hemithorax with air bronchograms. Interstitial prominence LEFT lung increased from prior examination. No LEFT pleural effusion or focal consolidation. Cardiac silhouette is predominantly  obscured. No pneumothorax. Single-lumen RIGHT chest Port-A-Cath via RIGHT internal jugular venous approach with distal tip projecting distal superior vena cava. No pneumothorax. Old LEFT rib fracture. IMPRESSION: 1. Worsening opacity of RIGHT lung concerning disease progression though given increased interstitial prominence on the LEFT, atypical infection or pulmonary edema possible. Electronically Signed   By: Elon Alas M.D.   On: 12/13/2018 20:53    PERFORMANCE STATUS (ECOG) : 4 - Bedbound  REVIEW OF SYSTEMS: As noted above. Otherwise, a complete review of systems is negative.  PHYSICAL EXAM: General: Critically ill-appearing Pulmonary: Unlabored Extremities: Edema Skin: no rashes Neurological: Poorly responsive  IMPRESSION: Follow-up visit made.  Wife says that patient has had intermittent lucidity, primarily at night.  He has had some bites to eat and has been able to communicate with his family.  However, patient has not markedly improved and he remains dyspneic on exertion.  Wife says she would like to focus only on his comfort at this point.  We discussed comfort care in detail and wife says should be okay with discontinuation of any non-comfort meds.  She would like to transfer patient to the [facility] when a bed is available.  Will consult social work to help coordinate disposition.  Case and plan discussed with Dr. Rogue Bussing.  PLAN: -Comfort care -Discontinue/hold non-comfort meds -We will liberalize hydromorphone -Social work consult for discharge planning -Residential hospice  when bed is available   Time Total: 25 minutes  Visit consisted of counseling and education dealing with the complex and emotionally intense issues of symptom management and palliative care in the setting of serious and potentially life-threatening illness.Greater than 50%  of this time was spent counseling and coordinating care related to the above assessment and plan.  Signed by: Altha Harm, PhD, NP-C [phone] (Work Cell)

## 2018-12-07 NOTE — TOC Initial Note (Signed)
Transition of Care [facility]) - Initial/Assessment Note    Patient Details  Name: Ruben Eaton MRN: [id number] Date of Birth: Dec 13, 1970  Transition of Care [facility]) CM/SW Contact:    Annamaria Boots, [place] Phone Number: 12/07/2018, 2:31 PM  Clinical Narrative:   CSW consulted for hospice facility placement. CSW met with patient's spouse [name] Terrance at bedside. [name] states that they have decided for comfort care at this time. Per wife, patient was living at home prior to hospitalization. Wife states that they would like patient to go to hospice home in [place]. CSW notified [facility] hospice liaison of referral. Santiago Glad will facilitate transition to hospice home when a bed is available.                 Expected Discharge Plan: [place] Barriers to Discharge: Hospice Bed not available   Patient Goals and CMS Choice Patient states their goals for this hospitalization and ongoing recovery are:: Family states goal is comfort  CMS Medicare.gov Compare Post Acute Care list provided to:: Patient Represenative (must comment)(Wife: [name] Knippel ) Choice offered to / list presented to : Spouse  Expected Discharge Plan and Services Expected Discharge Plan: [place] Discharge Planning Services: Other - See comment(Hospice home ) Post Acute Care Choice: Hospice Living arrangements for the past 2 months: Single Family Home                          Prior Living Arrangements/Services Living arrangements for the past 2 months: Single Family Home Lives with:: Spouse, Minor Children Patient language and need for interpreter reviewed:: Yes Do you feel safe going back to the place where you live?: No   Family feels that patient needs more care than they can give   Need for Family Participation in Patient Care: Yes (Comment) Care giver support system in place?: Yes (comment)   Criminal Activity/Legal Involvement Pertinent to Current  Situation/Hospitalization: No - Comment as needed  Activities of Daily Living Home Assistive Devices/Equipment: None ADL Screening (condition at time of admission) Patient's cognitive ability adequate to safely complete daily activities?: Yes Is the patient deaf or have difficulty hearing?: No Does the patient have difficulty seeing, even when wearing glasses/contacts?: No Does the patient have difficulty concentrating, remembering, or making decisions?: No Patient able to express need for assistance with ADLs?: Yes Does the patient have difficulty dressing or bathing?: No Independently performs ADLs?: Yes (appropriate for developmental age) Does the patient have difficulty walking or climbing stairs?: Yes Weakness of Legs: None Weakness of Arms/Hands: Both  Permission Sought/Granted Permission sought to share information with : Case Manager, Customer service manager, Family Supports Permission granted to share information with : Yes, Verbal Permission Granted  Share Information with NAME: Spouse- [place] granted to share info w AGENCY: [facility]         Emotional Assessment Appearance:: Appears stated age Attitude/Demeanor/Rapport: Unresponsive     Alcohol / Substance Use: Not Applicable Psych Involvement: No (comment)  Admission diagnosis:  Shortness of breath [R06.02] Sepsis, due to unspecified organism, unspecified whether acute organ dysfunction present [facility]) [A41.9] Patient Active Problem List   Diagnosis Date Noted  . Cancer related pain   . Palliative care encounter   . Protein-calorie malnutrition, severe 12/04/2018  . Sepsis ([place]) 12/06/2018  . HCAP (healthcare-associated pneumonia) 12/16/2018  . Hypertension   . 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 Non-small cell cancer of left  lung ([place]) 03/22/2018  . Anasarca 03/21/2018  . Acute respiratory failure with hypoxia ([place]) 03/21/2018  . Pure hypercholesterolemia 03/08/2018  . Goals of  care, counseling/discussion 03/02/2018  . Anxiety 03/02/2018  . Primary cancer of right lower lobe of lung ([place]) 02/25/2018  . Pleural effusion 02/21/2018  . Vaccine counseling 09/01/2017  . OSA on CPAP 10/27/2016  . History of stroke 10/08/2016  . PVC's (premature ventricular contractions) 10/08/2016  . PFO (patent foramen ovale) 10/07/2016  . TIA (transient ischemic attack) 09/07/2016  . CVA (cerebral vascular accident) ([place]) 09/07/2016  . 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 Essential (primary) hypertension 02/01/2016  . H/O disease 02/01/2016  . Quadriceps tendinitis 02/01/2016   PCP:  Dion Body, MD Pharmacy:   [facility] [phone], [place] [address] Phone: [phone] Fax: [phone]  [place] ([place], [place] [address] Phone: [phone] Fax: [phone]     Social Determinants of Health ([place]) Interventions    Readmission Risk Interventions 30 Day Unplanned Readmission Risk Score     ED to Hosp-Admission (Current) from 12/13/2018 in [place] (1C)  30 Day Unplanned Readmission Risk Score (%)  24 Filed at 12/07/2018 1200     This score is the patient's risk of an unplanned readmission within 30 days of being discharged (0 -100%). The score is based on dignosis, age, lab data, medications, orders, and past utilization.   Low:  0-14.9   Medium: 15-21.9   High: 22-29.9   Extreme: 30 and above       No flowsheet data found.

## 2018-12-07 NOTE — Progress Notes (Signed)
[place] at [place] NAME: Ruben Eaton    MR#:  [id number]  DATE OF BIRTH:  07-05-1971  SUBJECTIVE:   The patient is confused and lethargic, on oxygen by nasal cannula 3 L. REVIEW OF SYSTEMS:    Review of Systems  Constitutional: Negative for chills and fever.  HENT: Negative for congestion and tinnitus.   Eyes: Negative for blurred vision and double vision.  Respiratory: Negative for cough, shortness of breath and wheezing.   Cardiovascular: Negative for chest pain, orthopnea and PND.  Gastrointestinal: Negative for abdominal pain, diarrhea, nausea and vomiting.  Genitourinary: Negative for dysuria and hematuria.  Neurological: Negative for dizziness, sensory change and focal weakness.  All other systems reviewed and are negative.  DRUG ALLERGIES:  No Known Allergies  VITALS:  Blood pressure 103/76, pulse (!) 114, temperature (!) 96.1 F (35.6 C), temperature source Axillary, resp. rate 18, height '5\' 6"'$  (1.676 m), weight 61.9 kg, SpO2 98 %.  PHYSICAL EXAMINATION:   Physical Exam  GENERAL:  48 y.o.-year-old patient lying in bed in no acute distress.  EYES: Pupils equal, round, reactive to light and accommodation. No scleral icterus. Extraocular muscles intact.  HEENT: Head atraumatic, normocephalic. NECK:  Supple, no jugular venous distention. No thyroid enlargement, no tenderness.  LUNGS: Poor or no a/e on Right lung fields, crackles no rhonchi on the left side, no use of accessory muscles of respiration.  CARDIOVASCULAR: S1, S2 normal. No murmurs, rubs, or gallops. Right chest wall port-a-cath in place.   ABDOMEN: Soft, nontender, nondistended. Bowel sounds present. No organomegaly or mass.  EXTREMITIES: No cyanosis, clubbing or edema b/l.    NEUROLOGIC: Cranial nerves II through XII are intact. No focal Motor or sensory deficits b/l.   PSYCHIATRIC: The patient is alert and oriented x 2.  SKIN: No obvious rash, lesion, or  ulcer.    LABORATORY PANEL:   CBC Recent Labs  Lab 12/05/18 0904  WBC 10.0  HGB 8.5*  HCT 26.6*  PLT 71*   ------------------------------------------------------------------------------------------------------------------  Chemistries  Recent Labs  Lab 12/05/18 0904 12/07/18 0307  NA 128* 129*  K 3.0* 3.1*  CL 91* 91*  CO2 24 26  GLUCOSE 64* 73  BUN 24* 21*  CREATININE 0.55* 0.57*  CALCIUM 8.4* 8.5*  MG 1.9  --   AST 16  --   ALT 11  --   ALKPHOS 101  --   BILITOT 1.2  --    ------------------------------------------------------------------------------------------------------------------  Cardiac Enzymes No results for input(s): TROPONINI in the last 168 hours. ------------------------------------------------------------------------------------------------------------------  RADIOLOGY:  No results found.   ASSESSMENT AND PLAN:   48 year old male with past medical history of stage IV lung cancer, previous TIA, hypertension, obstructive sleep apnea, history of previous CVA who presented to the hospital due to fever, shortness of breath.  1.  Sepsis-patient met criteria on admission given his fever, tachycardia, leukocytosis and chest x-ray findings suggestive of pneumonia. -Blood cultures remain negative.  Completed IV cefepime and switched to oral Levaquin.  2.  Pneumonia- suspected to be HCAP.  -Completed cefepime, cultures negative.  Patient is already on oxygen at home. Changed to oral Levaquin.  3.  Lung cancer-patient has stage IV lung cancer currently ongoing treatment with Dr. Rogue Bussing. -Oncology repeated a CT chest yesterday which shows progression of his malignancy.  CT chest showing complete atelectasis of the right lung.  Pulmonary consult obtained and they do not think that bronchoscopy would be beneficial this is  likely tumor compressing the right lung rather than mucous plugging. -Appreciate oncology input and  plan was for initiating chemo and  also radiation as an outpatient in the next few weeks.  But the patient's condition is deteriorating. -Prognosis is quite poor and consider goals of comfort care.  Acute encephalopathy, possible related to chronic metastasis. For MRI of the brain, 13 mm rim enhancing lesion involving the right precentral gyrus, most suspicious for a new metastatic lesion. Mild localized vasogenic edema without significant mass effect.  4.  Anxiety/depression-continue Ativan, Cymbalta.  5. Essential HTN -hold metoprolol due to low side blood pressure.  6. Hx of previous PE - cont. Xarelto.   7. Superficial Right upper Ext. VTE - cont. Xarelto, keep arm elevated, Warm compress and supportive care.   Hypokalemia.  Potassium supplement. Hyponatremia.  Normal saline IV and encourage oral intake. Lactic acidosis.  IV fluid support.  CODE STATUS is changed to DNR. All the records are reviewed and case discussed with Care Management/Social Worker. Management plans discussed with the patient, wife and they are in agreement.  CODE STATUS: DNR.  DVT Prophylaxis: Xarelto  TOTAL TIME TAKING CARE OF THIS PATIENT: 27 minutes.   POSSIBLE D/C IN ? DAYS, DEPENDING ON CLINICAL CONDITION.   Demetrios Loll M.D on 12/07/2018 at 12:58 PM  Between 7am to 6pm - Pager - [phone]  After 6pm go to [email or web address] - Proofreader  [facility] [place] [facility]  Office  [phone]  CC: Primary care physician; Dion Body, MD

## 2018-12-07 NOTE — Progress Notes (Signed)
Ruben Eaton   DOB:07-29-1971   ZW#:[id number]    Subjective: As per family patient has been resting/sleeping more; he has been needing pain medication to control his pain.  As per the wife patient had been up in the bed trying to eat breakfast in the morning. Objective:  Vitals:   12/07/18 0357 12/07/18 1547  BP: 103/76 107/65  Pulse: (!) 114 (!) 110  Resp: 18 20  Temp: (!) 96.1 F (35.6 C) 97.7 F (36.5 C)  SpO2: 98% 97%    No intake or output data in the 24 hours ending 12/07/18 1707  Physical Exam  Constitutional:  Cachectic appearing Caucasian male patient.  Is resting in bed.  Drowsy; not arousable.  HENT:  Head: Normocephalic and atraumatic.  Mouth/Throat: Oropharynx is clear and moist. No oropharyngeal exudate.  Eyes: Pupils are equal, round, and reactive to light.  Neck: Normal range of motion. Neck supple.  Cardiovascular: Normal rate and regular rhythm.  Pulmonary/Chest: No respiratory distress. He has no wheezes.  Decreased air entry bilaterally right more than left.  Abdominal: Soft. Bowel sounds are normal. He exhibits no distension and no mass. There is no abdominal tenderness. There is no rebound and no guarding.  Musculoskeletal: Normal range of motion.        General: No tenderness or edema.     Comments: Right upper extremity swelling improved.  Neurological:  Drowsy not arousable.  Skin: Skin is warm.  Psychiatric: Affect normal.      Labs:  Lab Results  Component Value Date   WBC 10.0 12/05/2018   HGB 8.5 (L) 12/05/2018   HCT 26.6 (L) 12/05/2018   MCV 98.2 12/05/2018   PLT 71 (L) 12/05/2018   NEUTROABS 8.7 (H) 12/05/2018    Lab Results  Component Value Date   NA 129 (L) 12/07/2018   K 3.1 (L) 12/07/2018   CL 91 (L) 12/07/2018   CO2 26 12/07/2018    Studies:  No results found.  Primary cancer of right lower lobe of lung [facility]) #48 year old male patient with adenocarcinoma of the lung stage IV currently on Taxotere-Cyramza is admitted  to hospital for progressive shortness of breath/fever  # Non-small cell lung cancer/stage IV-currently status post Taxotere Cyramza approximately 10 days ago-approximately 10 days ago.  CT scan March 9-shows progressive right lung disease and also new nodules on the left upper lobe/small pleural effusion.  No further therapy/hospice  #Mental status changes-no focal deficits-question narcotic induced.  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  Stable  #Chest wall pain continue current medication.  Stable  #Patient is currently DNR/DNI.  Currently awaiting hospice bed.  Discussed with [employer].    Cammie Sickle, MD 12/07/2018  5:07 PM

## 2018-12-07 NOTE — Progress Notes (Signed)
New hospice home referral received following a Palliative Medicine consult. Patient is a 48 year old man with a known history of stage IV non-small cell lung cancer admitted to [facility] on 3/7 for treatment of sepsis from presumed right sided PNA. CT of the chest showed progressive disease with complete right lung atelectasis due to tumor burden, mediastinal shift, and reduced lung volumes/infiltrate in the left lung. During this  hospitalization patient has had altered mental status. MRI of the brain showed a mass in the right precentral gyrus with vasogenic edema. Patient has become less responsive and is eating only bits and sips, requiring IV dilaudid for pain and IV lorazepam for anxiety. Family met again  today with oncologist Dr. Rogue Bussing and Palliative NP Billey Chang, they have decided to pursue comfort with transfer to the hospice home.  Writer spoke in the room with patient's wife Ruben Eaton, she voiced understanding that there are currently no beds available at the hospice home. Writer explained th process of transfer over the weekend, [name] voiced understanding and stated she was in "no hurry". Emotional support given. Patient seen sitting up in bed, eyes closed, appeared to be sleeping. He did not participate in the conversation. Patient information faxed to referral. [place] team updated. Thank you. Flo Shanks BSN, RN, [facility] liaison [facility] [place] [phone]

## 2018-12-08 MED ORDER — GLYCOPYRROLATE 1 MG PO TABS
1.0000 mg | ORAL_TABLET | ORAL | Status: DC | PRN
  Filled 2018-12-08: qty 1

## 2018-12-08 MED ORDER — LORAZEPAM 2 MG/ML IJ SOLN
0.5000 mg | Freq: Three times a day (TID) | INTRAMUSCULAR | Status: DC | PRN
  Administered 2018-12-08: 0.5 mg via INTRAVENOUS
  Filled 2018-12-08: qty 1

## 2018-12-08 MED ORDER — GLYCOPYRROLATE 0.2 MG/ML IJ SOLN
0.2000 mg | INTRAMUSCULAR | Status: DC | PRN
  Filled 2018-12-08: qty 1

## 2018-12-08 MED ORDER — HALOPERIDOL LACTATE 5 MG/ML IJ SOLN: 0.5000 mg | INTRAMUSCULAR | Status: DC | PRN

## 2018-12-08 MED ORDER — BIOTENE DRY MOUTH MT LIQD: 15.0000 mL | OROMUCOSAL | Status: DC | PRN

## 2018-12-08 MED ORDER — ACETAMINOPHEN 325 MG PO TABS: 650.0000 mg | ORAL_TABLET | Freq: Four times a day (QID) | ORAL | Status: DC | PRN

## 2018-12-08 MED ORDER — POLYVINYL ALCOHOL 1.4 % OP SOLN
1.0000 [drp] | Freq: Four times a day (QID) | OPHTHALMIC | Status: DC | PRN
  Filled 2018-12-08: qty 15

## 2018-12-08 MED ORDER — ACETAMINOPHEN 650 MG RE SUPP: 650.0000 mg | Freq: Four times a day (QID) | RECTAL | Status: DC | PRN

## 2018-12-08 MED ORDER — GLYCOPYRROLATE 0.2 MG/ML IJ SOLN
0.2000 mg | INTRAMUSCULAR | Status: DC | PRN
  Administered 2018-12-08: 0.2 mg via INTRAVENOUS
  Filled 2018-12-08 (×2): qty 1

## 2018-12-08 MED ORDER — HALOPERIDOL 0.5 MG PO TABS
0.5000 mg | ORAL_TABLET | ORAL | Status: DC | PRN
  Filled 2018-12-08: qty 1

## 2018-12-08 MED ORDER — HALOPERIDOL LACTATE 2 MG/ML PO CONC
0.5000 mg | ORAL | Status: DC | PRN
  Filled 2018-12-08 (×2): qty 0.3

## 2018-12-08 MED ORDER — LORAZEPAM 2 MG/ML IJ SOLN
1.0000 mg | INTRAMUSCULAR | Status: DC | PRN
  Administered 2018-12-08 (×2): 1 mg via INTRAVENOUS
  Filled 2018-12-08 (×2): qty 1

## 2018-12-08 MED ORDER — GLYCOPYRROLATE 0.2 MG/ML IJ SOLN
0.2000 mg | INTRAMUSCULAR | Status: DC | PRN
  Administered 2018-12-08: 17:00:00 0.2 mg via INTRAVENOUS
  Filled 2018-12-08 (×2): qty 1

## 2018-12-08 MED ORDER — MORPHINE SULFATE (CONCENTRATE) 10 MG/0.5ML PO SOLN
10.0000 mg | ORAL | Status: DC | PRN
  Administered 2018-12-08: 19:00:00 10 mg via ORAL
  Filled 2018-12-08: qty 1

## 2018-12-10 ENCOUNTER — Inpatient Hospital Stay: Admit: 2018-12-10 | Payer: Self-pay | Admitting: Radiation Oncology

## 2018-12-12 IMAGING — CR DG CHEST 2V
2 series · 2 of 2 positions shown · non-contrast
Comparison: 03/23/2018

CLINICAL DATA: RIGHT pleural effusion

EXAM:
CHEST - 2 VIEW

[chest pa]
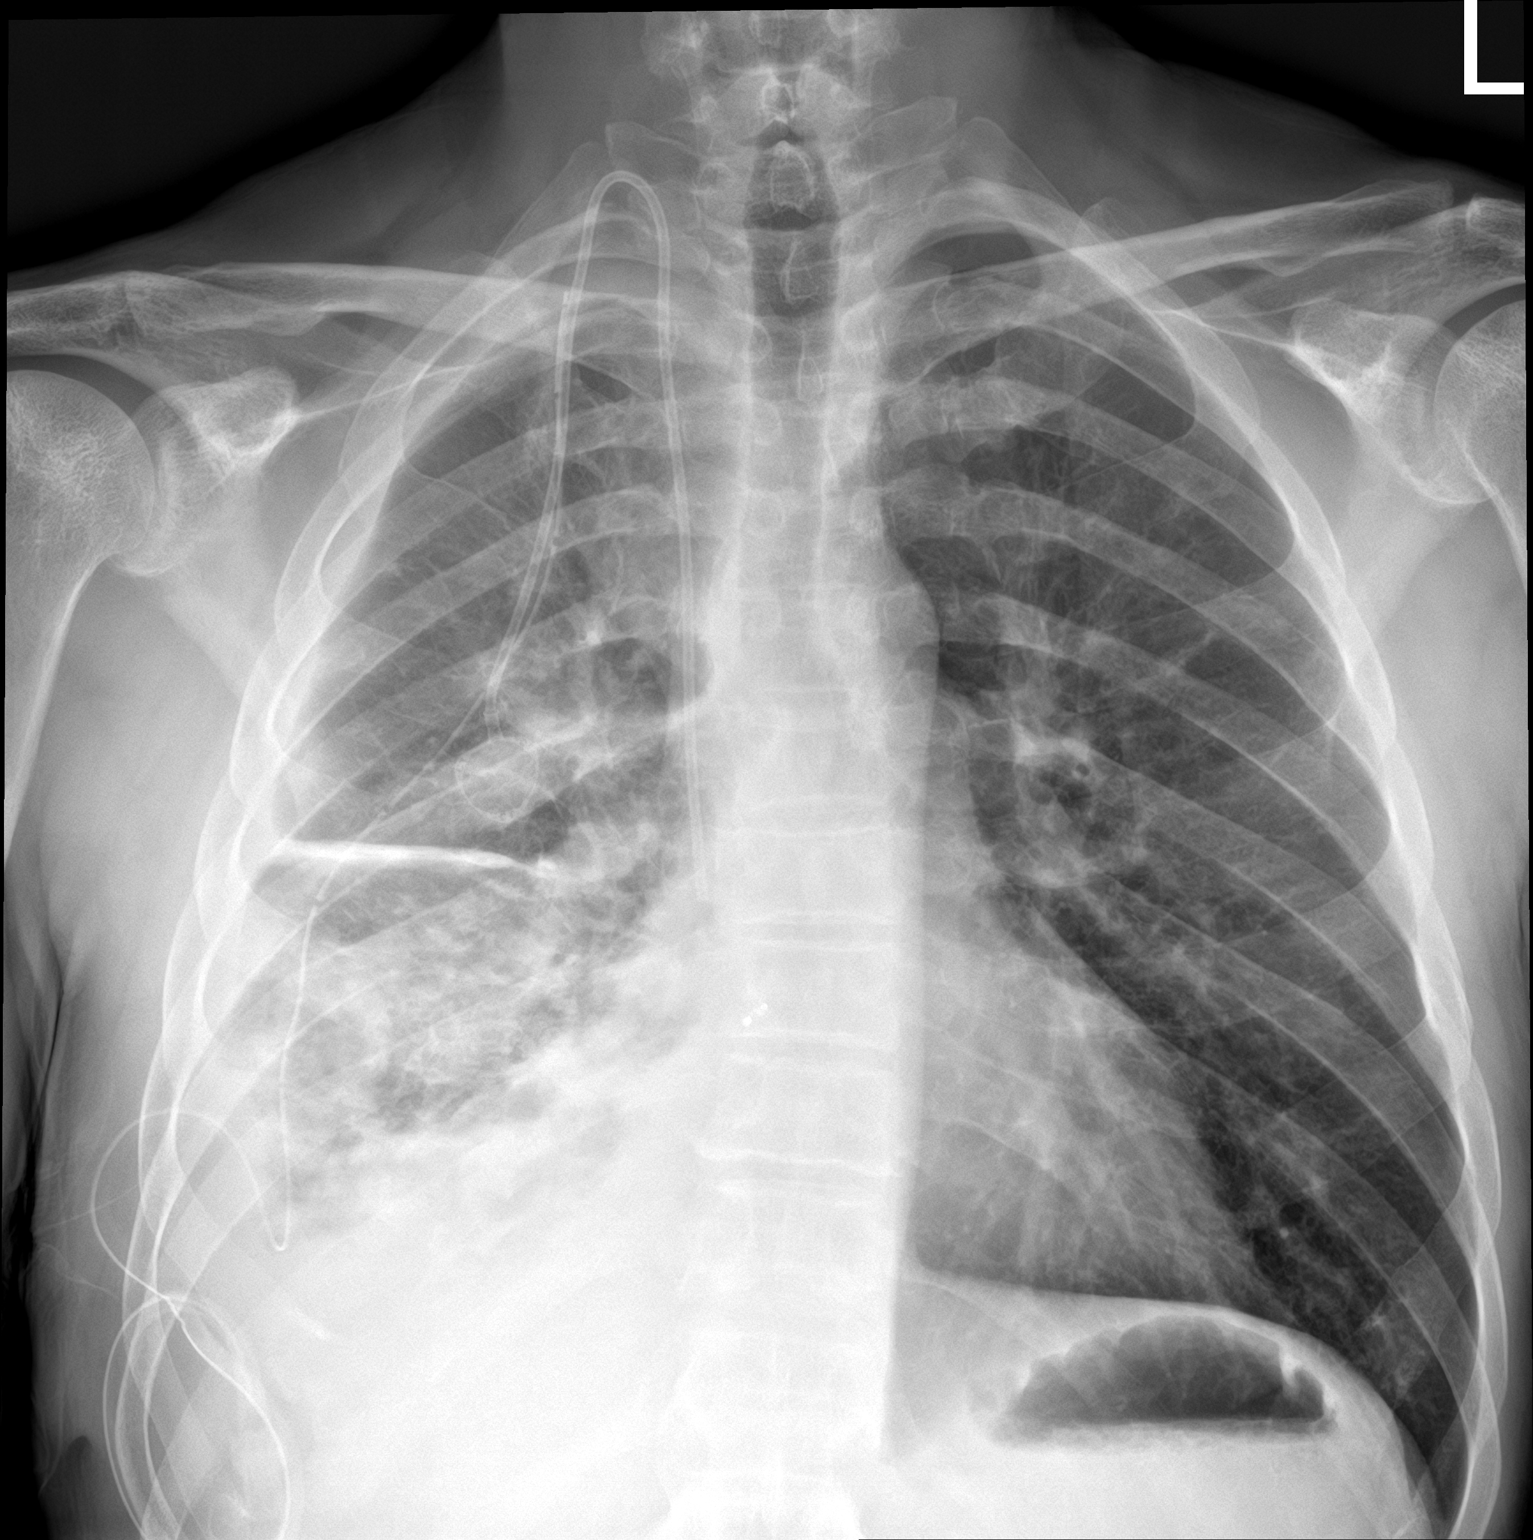

[chest lat]
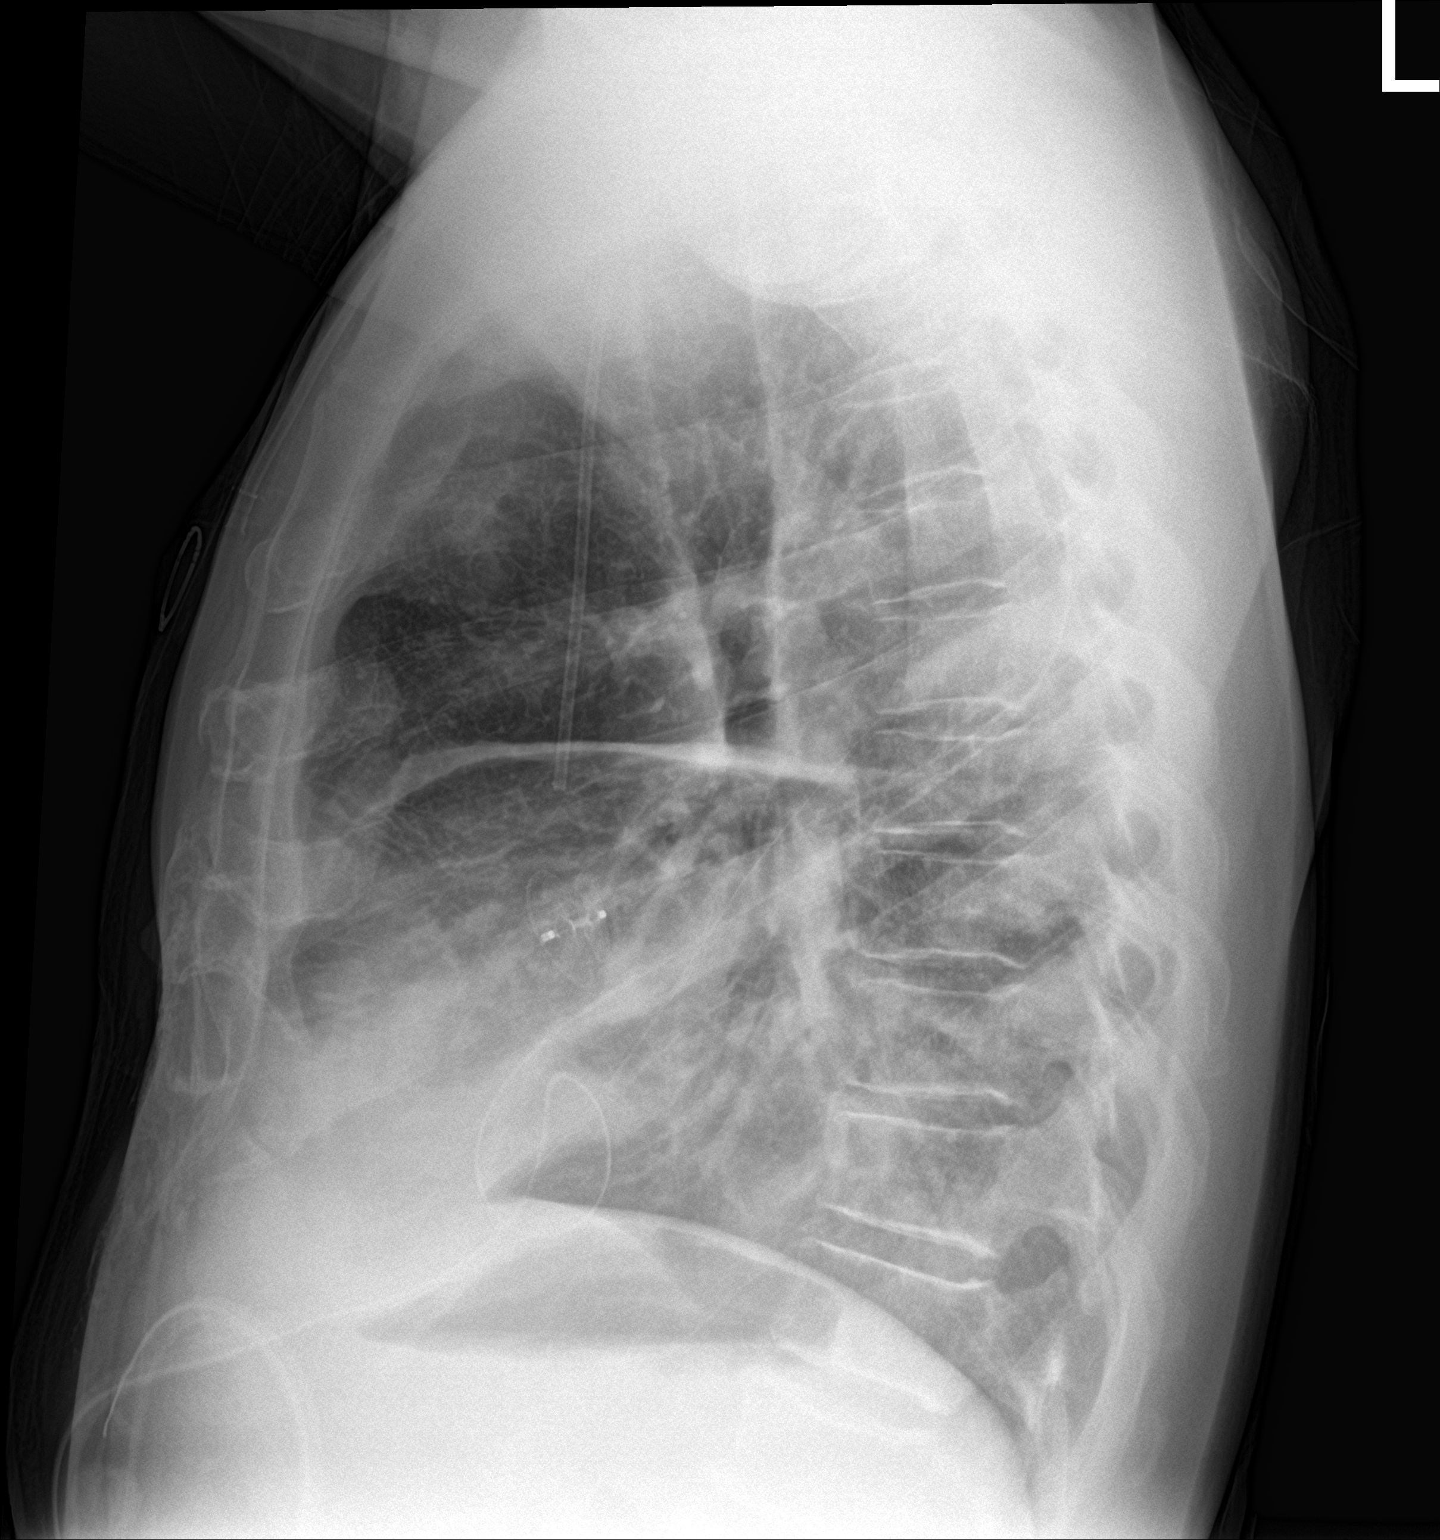

[2 of 2 positions shown; findings below may reference images not displayed]

FINDINGS: RIGHT jugular Port-A-Cath with tip projecting over SVC near
cavoatrial junction.

RIGHT PleurX catheter noted.

Normal heart size and mediastinal contours.

Decreased RIGHT pleural effusion and RIGHT lung atelectasis versus
prior study.

Coexistent infiltrate in the lower RIGHT lung not excluded.

LEFT lung emphysematous but clear.

Bones unremarkable.
IMPRESSION: Improved aeration in RIGHT hemithorax with decreased RIGHT pleural
effusion and atelectasis.

Cannot exclude coexistent infiltrate in lower RIGHT lung.

## 2018-12-13 ENCOUNTER — Other Ambulatory Visit: Payer: Self-pay

## 2018-12-13 ENCOUNTER — Ambulatory Visit: Payer: Self-pay | Admitting: Internal Medicine

## 2018-12-13 ENCOUNTER — Ambulatory Visit: Payer: Self-pay

## 2018-12-14 ENCOUNTER — Ambulatory Visit: Payer: Self-pay

## 2018-12-14 ENCOUNTER — Other Ambulatory Visit: Payer: Self-pay

## 2018-12-14 ENCOUNTER — Ambulatory Visit: Payer: Self-pay | Admitting: Internal Medicine

## 2018-12-17 ENCOUNTER — Ambulatory Visit: Payer: Self-pay

## 2018-12-17 ENCOUNTER — Encounter: Payer: Self-pay | Admitting: Hospice and Palliative Medicine

## 2018-12-26 NOTE — Progress Notes (Signed)
   12/20/18 2105  Clinical Encounter Type  Visited With Patient and family together;Health care provider  Visit Type Death  Referral From Nurse  Spiritual Encounters  Spiritual Needs Grief support;Emotional;Prayer   Chaplain responded to page regarding patient's death.  Chaplain maintained calming presence while offering silent and energetic prayers as family and staff expressed their grief.  Chaplain engaged in active and reflective listening as family and staff processed memories of patient's life.  Chaplain excused self to allow family and friends time together.  Chaplain checked in with staff and encouraged them to page as needed.

## 2018-12-26 NOTE — Progress Notes (Signed)
[place] at [place] NAME: Ruben Eaton    MR#:  [id number]  DATE OF BIRTH:  [birth year]  SUBJECTIVE:   Patient is under comfort care only and is lethargic and sedated.  Patient's wife is at bedside.  Appears comfortable, no acute events overnight.  REVIEW OF SYSTEMS:    Review of Systems  Unable to perform ROS: Mental acuity   DRUG ALLERGIES:  No Known Allergies  VITALS:  Blood pressure (!) 90/57, pulse (!) 114, temperature 98.4 F (36.9 C), temperature source Axillary, resp. rate (!) 24, height 5\' 6"  (1.676 m), weight 61.9 kg, SpO2 99 %.  PHYSICAL EXAMINATION:   Physical Exam  GENERAL:  48 y.o.-year-old patient lying in bed lethargic/sedated. EYES: Pupils equal, round, reactive to light. No scleral icterus.   HEENT: Head atraumatic, normocephalic. NECK:  Supple, no jugular venous distention. No thyroid enlargement, no tenderness.  LUNGS: Poor or no a/e on Right lung fields, No crackles no rhonchi on the left side, no use of accessory muscles of respiration.  CARDIOVASCULAR: S1, S2 normal. No murmurs, rubs, or gallops. Right chest wall port-a-cath in place.   ABDOMEN: Soft, nontender, nondistended. Bowel sounds present. No organomegaly or mass.  EXTREMITIES: No cyanosis, clubbing or edema b/l.    NEUROLOGIC: sedated/Encephalopathic   PSYCHIATRIC: Sedated/Encephalopathic SKIN: No obvious rash, lesion, or ulcer.    LABORATORY PANEL:   CBC Recent Labs  Lab 12/05/18 0904  WBC 10.0  HGB 8.5*  HCT 26.6*  PLT 71*   ------------------------------------------------------------------------------------------------------------------  Chemistries  Recent Labs  Lab 12/05/18 0904 12/07/18 0307  NA 128* 129*  K 3.0* 3.1*  CL 91* 91*  CO2 24 26  GLUCOSE 64* 73  BUN 24* 21*  CREATININE 0.55* 0.57*  CALCIUM 8.4* 8.5*  MG 1.9  --   AST 16  --   ALT 11  --   ALKPHOS 101  --   BILITOT 1.2  --     ------------------------------------------------------------------------------------------------------------------  Cardiac Enzymes No results for input(s): TROPONINI in the last 168 hours. ------------------------------------------------------------------------------------------------------------------  RADIOLOGY:  No results found.   ASSESSMENT AND PLAN:   48 year old male with past medical history of stage IV lung cancer, previous TIA, hypertension, obstructive sleep apnea, history of previous CVA who presented to the hospital due to fever, shortness of breath.  1.  Sepsis- thought to be secondary to Pneumonia was treated with IV abx.  2.  Pneumonia- suspected to be HCAP.  -Completed cefepime, cultures negative.  3.  Lung cancer-patient has stage IV lung cancer with Brain mets.  4.  Altered mental status/encephalopathy- secondary to brain metastases. 5.  Anxiety/depression-continue Ativan, Cymbalta. 6. Essential HTN -hold metoprolol due to low side blood pressure. 7. Hx of previous PE - cont. Xarelto.  8. Superficial Right upper Ext. VTE   Patient presented to the hospital with sepsis with pneumonia and was treated aggressively with IV antibiotics supportive care.  Patient underwent CT of the chest which showed worsening of his underlying malignancy and then became altered and was noted to have MRI findings suggestive of brain mets.  Patient's prognosis was quite poor and therefore palliative care consult was obtained.  After the discussion patient's wife and patient did not want to pursue aggressive care and he was made comfort care only.  Patient is awaiting a hospice home bed. -Continue IV morphine, Roxanol, IV Ativan, glycopyrrolate and supportive care.   All the records are reviewed and case discussed with Care Management/Social Worker.  Management plans discussed with the patient, wife and they are in agreement.  CODE STATUS: DNR.  DVT Prophylaxis: Xarelto  TOTAL TIME  TAKING CARE OF THIS PATIENT: 30 minutes.   POSSIBLE D/C IN ? DAYS, DEPENDING ON CLINICAL CONDITION.   Henreitta Leber M.D on Dec 26, 2018 at 11:57 AM  Between 7am to 6pm - Pager - [phone]  After 6pm go to [email or web address] - Proofreader  [facility] [place] [facility]  Office  [phone]  CC: Primary care physician; Dion Body, MD

## 2018-12-26 NOTE — Progress Notes (Signed)
Pt IV was removed and port deaccess. New gown placed and condom cath removed. [facility] pick up pt in room.

## 2018-12-26 NOTE — TOC Progression Note (Signed)
Transition of Care [facility]) - Progression Note    Patient Details  Name: Ruben Eaton MRN: [id number] Date of Birth: 01-31-1971  Transition of Care [facility]) CM/SW [place], [place] Phone Number: 01-07-2019, 4:26 PM  Clinical Narrative:   CSW contacted hospice home to inquire about bed availability. No beds are available, today, per Jackelyn Poling, RN admissions coordinator. The CSW updated the attending MD. CSW is following.    Expected Discharge Plan: [place] Barriers to Discharge: Hospice Bed not available  Expected Discharge Plan and Services Expected Discharge Plan: [place] Discharge Planning Services: Other - See comment(Hospice home ) Post Acute Care Choice: Hospice Living arrangements for the past 2 months: Single Family Home                           Social Determinants of Health ([place]OH) Interventions    Readmission Risk Interventions 30 Day Unplanned Readmission Risk Score     ED to Hosp-Admission (Current) from 11/29/2018 in [place] (1C)  30 Day Unplanned Readmission Risk Score (%)  26 Filed at 01/07/2019 1600     This score is the patient's risk of an unplanned readmission within 30 days of being discharged (0 -100%). The score is based on dignosis, age, lab data, medications, orders, and past utilization.   Low:  0-14.9   Medium: 15-21.9   High: 22-29.9   Extreme: 30 and above       No flowsheet data found.

## 2018-12-26 NOTE — Death Summary Note (Signed)
DEATH SUMMARY   Patient Details  Name: Ruben Eaton MRN: [id number] DOB: [birth year]  Admission/Discharge Information   Admit Date:  Dec 18, 2018  Date of Death: Date of Death: 12/25/2018  Time of Death: Time of Death: December 21, 2098  Length of Stay: 2022-12-18  Referring Physician: Dion Body, MD   Reason(s) for Hospitalization  Shortness of breath, fever, respiratory failure  Diagnoses  Preliminary cause of death:  Secondary Diagnoses (including complications and co-morbidities):  Principal Problem:   Sepsis ([place]) Active Problems:   Essential (primary) hypertension   Primary cancer of right lower lobe of lung ([place])   HCAP (healthcare-associated pneumonia)   Protein-calorie malnutrition, severe   Palliative care encounter   Cancer related pain   Brief Hospital Course (including significant findings, care, treatment, and services provided and events leading to death)  Ruben Eaton is a 48 y.o. year old male with past medical history of stage IV lung cancer, previous TIA, hypertension, obstructive sleep apnea, history of previous CVA who presented to the hospital due to fever, shortness of breath.  1.  Sepsis- thought to be secondary to Pneumonia was treated with IV abx.  2.  Pneumonia- suspected to be HCAP.  -Completed cefepime, cultures negative.  3.  Lung cancer-patient has stage IV lung cancer with Brain mets.  4.  Altered mental status/encephalopathy- secondary to brain metastases. 5.  Anxiety/depression-continue Ativan, Cymbalta. 6. Essential HTN -hold metoprolol due to low side blood pressure. 7. Hx of previous PE - cont. Xarelto.  8. Superficial Right upper Ext. VTE   Patient presented to the hospital with sepsis with pneumonia and was treated aggressively with IV antibiotics supportive care.  Patient underwent CT of the chest which showed worsening of his underlying malignancy and then became altered and was noted to have MRI findings suggestive of brain mets.  Patient's  prognosis was quite poor and therefore palliative care consult was obtained.  After the discussion with Palliative Care patient's wife and patient did not want to pursue aggressive care and he was made comfort care only.   - Pt. Was started on IV morphine, Roxanol, IV Ativan, glycopyrrolate and supportive care.  Patient passed away on the evening of December 25, 2018.   Pertinent Labs and Studies  Significant Diagnostic Studies Ct Chest W Contrast  Result Date: 12/03/2018 CLINICAL DATA:  Stage IV adenocarcinoma of the lung. EXAM: CT CHEST WITH CONTRAST TECHNIQUE: Multidetector CT imaging of the chest was performed during intravenous contrast administration. CONTRAST:  23mL OMNIPAQUE IOHEXOL 300 MG/ML  SOLN COMPARISON:  10/09/2018 FINDINGS: Cardiovascular: The heart size is normal. No substantial pericardial effusion. Persistent mass-effect on the inferior left atrium. Mediastinum/Nodes: Interval progression of right apical tumor with incorporation of the 12 mm right paratracheal lymph node measured on the prior study. Mediastinal component posterior to the heart measures 7.6 x 10.9 cm today compared to 6.6 x 8.8 cm when measured at the same level and in the same axes on the prior study. Lungs/Pleura: Marked involvement of the right thoracic wall/pleural space again noted. Anterior component measured previously at 6.3 x 4.3 cm is now 8.3 x 4.9 cm. Mass-effect on the mediastinum evident with leftward displacement. Similar appearance of mass-effect on right middle and lower lobe airways. Very little residual aerated right lung evident on today's study compatible with tumor progression. New areas of left lung involvement identified on today's exam including irregular 1.8 x 1.4 cm left lower lobe nodule and 1.9 x 1.5 cm anterior left upper lobe nodule. Numerous other tiny  left lung nodules evident with new pleural nodularity diffusely in the left hemithorax. Small left pleural effusion is new in the interval. Upper  Abdomen: Although incompletely visualize, there is probable necrotic lymphadenopathy in the upper abdomen which appears new since 10/24/2018. Musculoskeletal: No worrisome lytic or sclerotic osseous abnormality. IMPRESSION: 1. Interval progression of the disease in the right hemithorax with almost no residual aerated right lung on today's exam. 2. Interval development of diffuse pleural nodularity in the left hemithorax with new areas of irregular nodularity in the parenchyma of the left lung. 3. New small left pleural effusion. 4. Although incompletely visualized, there appears to be necrotic lymphadenopathy in the upper abdomen, new since 10/24/2018. Electronically Signed   By: Misty Stanley M.D.   On: 12/03/2018 09:13   Mr Jeri Cos WJ Contrast  Result Date: 12/05/2018 CLINICAL DATA:  Initial evaluation for acute altered mental status, history of lung cancer. EXAM: MRI HEAD WITHOUT AND WITH CONTRAST TECHNIQUE: Multiplanar, multiecho pulse sequences of the brain and surrounding structures were obtained without and with intravenous contrast. CONTRAST:  60 cc of Gadavist. COMPARISON:  Prior MRI from 07/15/2018. FINDINGS: Brain: Examination moderately degraded by motion artifact. Cerebral volume within normal limits for age, stable from previous. Small remote infarct involving the right insular cortex again noted. Additional small remote infarcts involving the bilateral cerebellum noted as well. Appearance is stable from previous. There is a new 13 mm lesion involving the cortical gray matter of the right precentral gyrus (series 10, image 23). Lesion demonstrates hyperintense central T2 cystic intensity with associated rim enhancement (series 19, image 22). Small amount of surrounding FLAIR signal intensity suggestive of edema (series 16, image 21). Mild associated susceptibility artifact suggestive of small amount of blood products. Finding most concerning for a small metastatic lesion. Additionally, there are a  few small foci of a adjacent diffusion abnormality measuring up to approximately 9 mm (series 5, image 40, 44). Associated faint enhancement about 1 of these foci involving the subcortical white matter (series 19, image 19). These foci are more indeterminate, and could reflect additional small lesions versus subacute infarcts. No other mass lesion or abnormal enhancement identified on this motion degraded exam. No other evidence for acute or subacute ischemia. No other acute intracranial hemorrhage. No midline shift or significant mass effect. No hydrocephalus. No extra-axial fluid collection. Pituitary gland suprasellar region normal. Vascular: Major intracranial vascular flow voids are maintained. Skull and upper cervical spine: Craniocervical junction within normal limits. No focal marrow replacing lesion. No scalp soft tissue abnormality. Sinuses/Orbits: Patient status post ocular lens replacement on the right. Globes and orbital soft tissues demonstrate no acute finding. Mucosal thickening with small retention cyst noted within the right maxillary sinus. Paranasal sinuses are otherwise clear. No mastoid effusion. Inner ear structures normal. Other: None. IMPRESSION: 1. 13 mm rim enhancing lesion involving the right precentral gyrus, most suspicious for a new metastatic lesion. Mild localized vasogenic edema without significant mass effect. 2. Few additional subcentimeter foci of diffusion abnormality and enhancement involving the adjacent posterior right frontal region as above, indeterminate. While these could reflect additional small/early metastatic lesions, possible small acute to subacute infarcts could also have this appearance. Short interval follow-up MRI in approximately 4-6 weeks suggested for further evaluation and to evaluate for interval change. 3. 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 Electronically Signed   By:  Jeannine Boga M.D.   On: 12/05/2018 13:54   US Venous  Img Upper Uni Right  Result Date: 12/03/2018 CLINICAL DATA:  Right upper extremity edema. History of lung cancer. History of pulmonary embolism. Evaluate for DVT. EXAM: RIGHT UPPER EXTREMITY VENOUS DOPPLER ULTRASOUND TECHNIQUE: Gray-scale sonography with graded compression, as well as color Doppler and duplex ultrasound were performed to evaluate the upper extremity deep venous system from the level of the subclavian vein and including the jugular, axillary, basilic, radial, ulnar and upper cephalic vein. Spectral Doppler was utilized to evaluate flow at rest and with distal augmentation maneuvers. COMPARISON:  None. FINDINGS: Contralateral Subclavian Vein: Respiratory phasicity is normal and symmetric with the symptomatic side. No evidence of thrombus. Normal compressibility. Internal Jugular Vein: No evidence of thrombus. Normal compressibility, respiratory phasicity and response to augmentation. Subclavian Vein: No evidence of thrombus. Normal compressibility, respiratory phasicity and response to augmentation. Axillary Vein: No evidence of thrombus. Normal compressibility, respiratory phasicity and response to augmentation. Cephalic Vein: No evidence of thrombus. Normal compressibility, respiratory phasicity and response to augmentation. Basilic Vein: No evidence of thrombus. Normal compressibility, respiratory phasicity and response to augmentation. Brachial Veins: There is short-segment age-indeterminate nonocclusive wall thickening involving 1 of the paired brachial veins (image 22 and 23). The adjacent brachial vein appears widely patent. Radial Veins: No evidence of thrombus. Normal compressibility, respiratory phasicity and response to augmentation. Ulnar Veins: No evidence of thrombus. Normal compressibility, respiratory phasicity and response to augmentation. Venous Reflux:  None visualized. Other Findings:  None visualized. IMPRESSION: 1.  Examination is positive for Short-segment age-indeterminate nonocclusive wall thickening/DVT involving one of the paired right brachial veins. The adjacent paired brachial vein appears widely patent. 2. Otherwise, normal right upper extremity venous Doppler ultrasound. Electronically Signed   By: Sandi Mariscal M.D.   On: 12/03/2018 09:59   Dg Chest Portable 1 View  Result Date: 12/20/2018 CLINICAL DATA:  Severe dyspnea.  History of cancer. EXAM: PORTABLE CHEST 1 VIEW COMPARISON:  12/15/2018 at 1721 hours FINDINGS: Near complete opacification of the right hemithorax sparing the right hilar region. Slight interval worsening of pulmonary consolidation noted at the right base since prior. Mild interstitial edema the left lung persists. Obscuration of the right heart border is noted. Port catheter tip is unchanged projecting to the cavoatrial level. No aggressive osseous abnormality. IMPRESSION: Interval slight worsening of right base pulmonary opacity since most recent exam. Electronically Signed   By: Ashley Royalty M.D.   On: 12/13/2018 23:37   Dg Chest Port 1 View  Result Date: 12/06/2018 CLINICAL DATA:  Fever and shortness of breath today. On chemotherapy for lung cancer. EXAM: PORTABLE CHEST 1 VIEW COMPARISON:  Chest radiograph October 15, 2018 FINDINGS: Worsening opacification RIGHT hemithorax with air bronchograms. Interstitial prominence LEFT lung increased from prior examination. No LEFT pleural effusion or focal consolidation. Cardiac silhouette is predominantly obscured. No pneumothorax. Single-lumen RIGHT chest Port-A-Cath via RIGHT internal jugular venous approach with distal tip projecting distal superior vena cava. No pneumothorax. Old LEFT rib fracture. IMPRESSION: 1. Worsening opacity of RIGHT lung concerning disease progression though given increased interstitial prominence on the LEFT, atypical infection or pulmonary edema possible. Electronically Signed   By: Elon Alas M.D.   On: 11/30/2018  20:53    Microbiology Recent Results (from the past 240 hour(s))  Blood Culture (routine x 2)     Status: None   Collection Time: 11/29/2018  9:12 PM  Result Value Ref Range Status   Specimen Description BLOOD LEFT ANTECUBITAL  Final   Special Requests   Final    Blood  Culture results may not be optimal due to an excessive volume of blood received in culture bottles   Culture   Final    NO GROWTH 5 DAYS Performed at [facility], [address] [place], [place] [address]    Report Status 12/06/2018 FINAL  Final  Blood Culture (routine x 2)     Status: None   Collection Time: 12/23/2018  9:36 PM  Result Value Ref Range Status   Specimen Description BLOOD RIGHT HAND  Final   Special Requests   Final    Blood Culture results may not be optimal due to an excessive volume of blood received in culture bottles   Culture   Final    NO GROWTH 5 DAYS Performed at [facility], [address] [place], [place] [address]    Report Status 12/06/2018 FINAL  Final  MRSA PCR Screening     Status: None   Collection Time: 12/02/18 10:48 AM  Result Value Ref Range Status   MRSA by PCR NEGATIVE NEGATIVE Final    Comment:        The GeneXpert MRSA Assay (FDA approved for NASAL specimens only), is one component of a comprehensive MRSA colonization surveillance program. It is not intended to diagnose MRSA infection nor to guide or monitor treatment for MRSA infections. Performed at [facility] [address] [place], [place] [address]   Respiratory Panel by PCR     Status: None   Collection Time: 12/02/18 10:48 AM  Result Value Ref Range Status   Adenovirus NOT DETECTED NOT DETECTED Final   Coronavirus 229E NOT DETECTED NOT DETECTED Final    Comment: (NOTE) The Coronavirus on the Respiratory Panel, DOES NOT test for the novel  Coronavirus (2019 nCoV)    Coronavirus HKU1 NOT DETECTED NOT DETECTED Final   Coronavirus NL63 NOT DETECTED NOT DETECTED Final    Coronavirus OC43 NOT DETECTED NOT DETECTED Final   Metapneumovirus NOT DETECTED NOT DETECTED Final   Rhinovirus / Enterovirus NOT DETECTED NOT DETECTED Final   Influenza A NOT DETECTED NOT DETECTED Final   Influenza B NOT DETECTED NOT DETECTED Final   Parainfluenza Virus 1 NOT DETECTED NOT DETECTED Final   Parainfluenza Virus 2 NOT DETECTED NOT DETECTED Final   Parainfluenza Virus 3 NOT DETECTED NOT DETECTED Final   Parainfluenza Virus 4 NOT DETECTED NOT DETECTED Final   Respiratory Syncytial Virus NOT DETECTED NOT DETECTED Final   Bordetella pertussis NOT DETECTED NOT DETECTED Final   Chlamydophila pneumoniae NOT DETECTED NOT DETECTED Final   Mycoplasma pneumoniae NOT DETECTED NOT DETECTED Final    Comment: Performed at [facility], [place] [address] [place], [place] [address]    Lab Basic Metabolic Panel: Recent Labs  Lab 12/05/18 0904 12/07/18 0307  NA 128* 129*  K 3.0* 3.1*  CL 91* 91*  CO2 24 26  GLUCOSE 64* 73  BUN 24* 21*  CREATININE 0.55* 0.57*  CALCIUM 8.4* 8.5*  MG 1.9  --    Liver Function Tests: Recent Labs  Lab 12/05/18 0904  AST 16  ALT 11  ALKPHOS 101  BILITOT 1.2  PROT 5.1*  ALBUMIN 1.9*   No results for input(s): LIPASE, AMYLASE in the last 168 hours. No results for input(s): AMMONIA in the last 168 hours. CBC: Recent Labs  Lab 12/03/18 0256 12/05/18 0904  WBC 10.0 10.0  NEUTROABS  --  8.7*  HGB 8.7* 8.5*  HCT 26.6* 26.6*  MCV 97.4 98.2  PLT 97*  71*   Cardiac Enzymes: No results for input(s): CKTOTAL, CKMB, CKMBINDEX, TROPONINI in the last 168 hours. Sepsis Labs: Recent Labs  Lab 12/03/18 0256 12/05/18 0904 12/05/18 1215  WBC 10.0 10.0  --   LATICACIDVEN  --  2.2* 2.2*    Procedures/Operations  None   Henreitta Leber 12/09/2018, 1:02 PM

## 2018-12-26 DEATH — deceased

## 2019-02-25 ENCOUNTER — Encounter: Payer: Self-pay | Admitting: Internal Medicine
